# Patient Record
Sex: Male | Born: 1955 | Race: White | Hispanic: No | Marital: Married | Smoking: Never smoker
Health system: Southern US, Community
[De-identification: ages and names within clinical notes are randomized; demographics above are authoritative.]

## PROBLEM LIST (undated history)

## (undated) DIAGNOSIS — M19012 Primary osteoarthritis, left shoulder: Secondary | ICD-10-CM

## (undated) DIAGNOSIS — E785 Hyperlipidemia, unspecified: Secondary | ICD-10-CM

## (undated) DIAGNOSIS — Z860100 Personal history of colon polyps, unspecified: Secondary | ICD-10-CM

## (undated) DIAGNOSIS — I10 Essential (primary) hypertension: Secondary | ICD-10-CM

## (undated) DIAGNOSIS — C73 Malignant neoplasm of thyroid gland: Secondary | ICD-10-CM

## (undated) DIAGNOSIS — G4730 Sleep apnea, unspecified: Secondary | ICD-10-CM

## (undated) DIAGNOSIS — E890 Postprocedural hypothyroidism: Secondary | ICD-10-CM

## (undated) DIAGNOSIS — I4891 Unspecified atrial fibrillation: Secondary | ICD-10-CM

## (undated) DIAGNOSIS — I499 Cardiac arrhythmia, unspecified: Secondary | ICD-10-CM

## (undated) DIAGNOSIS — C801 Malignant (primary) neoplasm, unspecified: Secondary | ICD-10-CM

## (undated) DIAGNOSIS — F419 Anxiety disorder, unspecified: Secondary | ICD-10-CM

## (undated) DIAGNOSIS — I872 Venous insufficiency (chronic) (peripheral): Secondary | ICD-10-CM

## (undated) DIAGNOSIS — L0390 Cellulitis, unspecified: Secondary | ICD-10-CM

## (undated) DIAGNOSIS — E119 Type 2 diabetes mellitus without complications: Secondary | ICD-10-CM

## (undated) DIAGNOSIS — I5032 Chronic diastolic (congestive) heart failure: Secondary | ICD-10-CM

## (undated) DIAGNOSIS — M1990 Unspecified osteoarthritis, unspecified site: Secondary | ICD-10-CM

## (undated) DIAGNOSIS — K649 Unspecified hemorrhoids: Secondary | ICD-10-CM

## (undated) DIAGNOSIS — G43909 Migraine, unspecified, not intractable, without status migrainosus: Secondary | ICD-10-CM

## (undated) HISTORY — PX: TOTAL SHOULDER REPLACEMENT: SUR1217

## (undated) HISTORY — DX: Personal history of colonic polyps: Z86.010

## (undated) HISTORY — PX: KNEE ARTHROSCOPY: SUR90

## (undated) HISTORY — PX: TOTAL KNEE ARTHROPLASTY: SHX125

## (undated) HISTORY — DX: Postprocedural hypothyroidism: E89.0

## (undated) HISTORY — DX: Migraine, unspecified, not intractable, without status migrainosus: G43.909

## (undated) HISTORY — PX: REPLACEMENT TOTAL KNEE: SUR1224

## (undated) HISTORY — DX: Unspecified osteoarthritis, unspecified site: M19.90

## (undated) HISTORY — PX: SHOULDER ARTHROSCOPY: SHX128

## (undated) HISTORY — DX: Essential (primary) hypertension: I10

## (undated) HISTORY — PX: ANKLE FUSION: SHX881

## (undated) HISTORY — DX: Malignant (primary) neoplasm, unspecified: C80.1

## (undated) HISTORY — DX: Chronic diastolic (congestive) heart failure: I50.32

## (undated) HISTORY — DX: Primary osteoarthritis, left shoulder: M19.012

## (undated) HISTORY — DX: Cellulitis, unspecified: L03.90

## (undated) HISTORY — DX: Unspecified atrial fibrillation: I48.91

## (undated) HISTORY — DX: Malignant neoplasm of thyroid gland: C73

## (undated) HISTORY — DX: Hyperlipidemia, unspecified: E78.5

## (undated) HISTORY — DX: Unspecified hemorrhoids: K64.9

## (undated) HISTORY — DX: Personal history of colon polyps, unspecified: Z86.0100

## (undated) HISTORY — DX: Type 2 diabetes mellitus without complications: E11.9

## (undated) HISTORY — DX: Anxiety disorder, unspecified: F41.9

## (undated) HISTORY — DX: Venous insufficiency (chronic) (peripheral): I87.2

## (undated) HISTORY — DX: Sleep apnea, unspecified: G47.30

---

## 1987-10-12 DIAGNOSIS — C801 Malignant (primary) neoplasm, unspecified: Secondary | ICD-10-CM

## 1987-10-12 HISTORY — DX: Malignant (primary) neoplasm, unspecified: C80.1

## 2016-09-16 DIAGNOSIS — I4891 Unspecified atrial fibrillation: Secondary | ICD-10-CM | POA: Insufficient documentation

## 2017-09-26 DIAGNOSIS — E1141 Type 2 diabetes mellitus with diabetic mononeuropathy: Secondary | ICD-10-CM | POA: Insufficient documentation

## 2017-09-26 DIAGNOSIS — E1159 Type 2 diabetes mellitus with other circulatory complications: Secondary | ICD-10-CM | POA: Insufficient documentation

## 2017-09-26 DIAGNOSIS — E890 Postprocedural hypothyroidism: Secondary | ICD-10-CM | POA: Diagnosis not present

## 2017-09-26 DIAGNOSIS — E1169 Type 2 diabetes mellitus with other specified complication: Secondary | ICD-10-CM | POA: Insufficient documentation

## 2017-09-26 DIAGNOSIS — E119 Type 2 diabetes mellitus without complications: Secondary | ICD-10-CM | POA: Diagnosis not present

## 2017-09-26 DIAGNOSIS — L84 Corns and callosities: Secondary | ICD-10-CM | POA: Insufficient documentation

## 2017-09-26 DIAGNOSIS — I10 Essential (primary) hypertension: Secondary | ICD-10-CM

## 2017-09-26 DIAGNOSIS — I152 Hypertension secondary to endocrine disorders: Secondary | ICD-10-CM | POA: Insufficient documentation

## 2017-09-26 DIAGNOSIS — I482 Chronic atrial fibrillation: Secondary | ICD-10-CM | POA: Diagnosis not present

## 2017-09-26 DIAGNOSIS — L03116 Cellulitis of left lower limb: Secondary | ICD-10-CM | POA: Diagnosis not present

## 2017-10-06 DIAGNOSIS — B351 Tinea unguium: Secondary | ICD-10-CM | POA: Diagnosis not present

## 2017-10-06 DIAGNOSIS — E119 Type 2 diabetes mellitus without complications: Secondary | ICD-10-CM | POA: Diagnosis not present

## 2017-10-06 DIAGNOSIS — M79675 Pain in left toe(s): Secondary | ICD-10-CM | POA: Diagnosis not present

## 2017-10-06 DIAGNOSIS — M79674 Pain in right toe(s): Secondary | ICD-10-CM | POA: Diagnosis not present

## 2017-11-07 DIAGNOSIS — E890 Postprocedural hypothyroidism: Secondary | ICD-10-CM | POA: Diagnosis not present

## 2017-11-07 DIAGNOSIS — E119 Type 2 diabetes mellitus without complications: Secondary | ICD-10-CM | POA: Diagnosis not present

## 2017-11-07 DIAGNOSIS — I482 Chronic atrial fibrillation: Secondary | ICD-10-CM | POA: Diagnosis not present

## 2017-11-07 DIAGNOSIS — I10 Essential (primary) hypertension: Secondary | ICD-10-CM | POA: Diagnosis not present

## 2017-11-14 DIAGNOSIS — E11319 Type 2 diabetes mellitus with unspecified diabetic retinopathy without macular edema: Secondary | ICD-10-CM | POA: Diagnosis not present

## 2017-11-14 DIAGNOSIS — H524 Presbyopia: Secondary | ICD-10-CM

## 2017-11-14 DIAGNOSIS — H5211 Myopia, right eye: Secondary | ICD-10-CM | POA: Diagnosis not present

## 2017-11-14 DIAGNOSIS — H5213 Myopia, bilateral: Secondary | ICD-10-CM | POA: Insufficient documentation

## 2017-11-14 DIAGNOSIS — H52203 Unspecified astigmatism, bilateral: Secondary | ICD-10-CM

## 2017-11-14 DIAGNOSIS — H25813 Combined forms of age-related cataract, bilateral: Secondary | ICD-10-CM | POA: Insufficient documentation

## 2017-11-15 DIAGNOSIS — M19012 Primary osteoarthritis, left shoulder: Secondary | ICD-10-CM | POA: Diagnosis not present

## 2017-11-22 DIAGNOSIS — E119 Type 2 diabetes mellitus without complications: Secondary | ICD-10-CM | POA: Diagnosis not present

## 2017-11-22 DIAGNOSIS — Z885 Allergy status to narcotic agent status: Secondary | ICD-10-CM | POA: Diagnosis not present

## 2017-11-22 DIAGNOSIS — Z6841 Body Mass Index (BMI) 40.0 and over, adult: Secondary | ICD-10-CM | POA: Diagnosis not present

## 2017-11-22 DIAGNOSIS — E890 Postprocedural hypothyroidism: Secondary | ICD-10-CM | POA: Diagnosis not present

## 2017-11-22 DIAGNOSIS — C73 Malignant neoplasm of thyroid gland: Secondary | ICD-10-CM | POA: Diagnosis not present

## 2017-11-22 DIAGNOSIS — I482 Chronic atrial fibrillation: Secondary | ICD-10-CM | POA: Diagnosis not present

## 2017-11-22 DIAGNOSIS — Z79899 Other long term (current) drug therapy: Secondary | ICD-10-CM | POA: Diagnosis not present

## 2017-11-22 DIAGNOSIS — Z7984 Long term (current) use of oral hypoglycemic drugs: Secondary | ICD-10-CM | POA: Diagnosis not present

## 2017-11-29 DIAGNOSIS — C73 Malignant neoplasm of thyroid gland: Secondary | ICD-10-CM | POA: Insufficient documentation

## 2017-12-22 DIAGNOSIS — C73 Malignant neoplasm of thyroid gland: Secondary | ICD-10-CM | POA: Diagnosis not present

## 2017-12-22 DIAGNOSIS — E890 Postprocedural hypothyroidism: Secondary | ICD-10-CM | POA: Diagnosis not present

## 2017-12-30 DIAGNOSIS — Z86010 Personal history of colonic polyps: Secondary | ICD-10-CM | POA: Diagnosis not present

## 2018-01-05 DIAGNOSIS — E890 Postprocedural hypothyroidism: Secondary | ICD-10-CM | POA: Diagnosis not present

## 2018-01-05 DIAGNOSIS — I872 Venous insufficiency (chronic) (peripheral): Secondary | ICD-10-CM | POA: Diagnosis not present

## 2018-01-05 DIAGNOSIS — G4733 Obstructive sleep apnea (adult) (pediatric): Secondary | ICD-10-CM | POA: Insufficient documentation

## 2018-01-05 DIAGNOSIS — E119 Type 2 diabetes mellitus without complications: Secondary | ICD-10-CM | POA: Diagnosis not present

## 2018-01-05 DIAGNOSIS — I482 Chronic atrial fibrillation: Secondary | ICD-10-CM | POA: Diagnosis not present

## 2018-01-17 DIAGNOSIS — L821 Other seborrheic keratosis: Secondary | ICD-10-CM | POA: Diagnosis not present

## 2018-01-17 DIAGNOSIS — L918 Other hypertrophic disorders of the skin: Secondary | ICD-10-CM | POA: Diagnosis not present

## 2018-01-17 DIAGNOSIS — D1801 Hemangioma of skin and subcutaneous tissue: Secondary | ICD-10-CM | POA: Diagnosis not present

## 2018-01-17 DIAGNOSIS — L814 Other melanin hyperpigmentation: Secondary | ICD-10-CM | POA: Diagnosis not present

## 2018-02-08 DIAGNOSIS — I10 Essential (primary) hypertension: Secondary | ICD-10-CM | POA: Diagnosis not present

## 2018-02-08 DIAGNOSIS — E119 Type 2 diabetes mellitus without complications: Secondary | ICD-10-CM | POA: Diagnosis not present

## 2018-02-08 DIAGNOSIS — E785 Hyperlipidemia, unspecified: Secondary | ICD-10-CM | POA: Insufficient documentation

## 2018-02-08 DIAGNOSIS — E1169 Type 2 diabetes mellitus with other specified complication: Secondary | ICD-10-CM | POA: Insufficient documentation

## 2018-02-08 DIAGNOSIS — I482 Chronic atrial fibrillation: Secondary | ICD-10-CM | POA: Diagnosis not present

## 2018-02-08 DIAGNOSIS — E890 Postprocedural hypothyroidism: Secondary | ICD-10-CM | POA: Diagnosis not present

## 2018-02-08 DIAGNOSIS — E782 Mixed hyperlipidemia: Secondary | ICD-10-CM | POA: Diagnosis not present

## 2018-02-13 DIAGNOSIS — E119 Type 2 diabetes mellitus without complications: Secondary | ICD-10-CM | POA: Diagnosis not present

## 2018-02-13 DIAGNOSIS — I110 Hypertensive heart disease with heart failure: Secondary | ICD-10-CM | POA: Diagnosis not present

## 2018-02-13 DIAGNOSIS — I482 Chronic atrial fibrillation: Secondary | ICD-10-CM | POA: Diagnosis not present

## 2018-02-13 DIAGNOSIS — Z79899 Other long term (current) drug therapy: Secondary | ICD-10-CM | POA: Diagnosis not present

## 2018-02-13 DIAGNOSIS — I5030 Unspecified diastolic (congestive) heart failure: Secondary | ICD-10-CM | POA: Diagnosis not present

## 2018-02-13 DIAGNOSIS — Z7901 Long term (current) use of anticoagulants: Secondary | ICD-10-CM | POA: Diagnosis not present

## 2018-02-13 DIAGNOSIS — Z7984 Long term (current) use of oral hypoglycemic drugs: Secondary | ICD-10-CM | POA: Diagnosis not present

## 2018-02-13 DIAGNOSIS — I5033 Acute on chronic diastolic (congestive) heart failure: Secondary | ICD-10-CM | POA: Diagnosis not present

## 2018-02-14 DIAGNOSIS — B351 Tinea unguium: Secondary | ICD-10-CM | POA: Diagnosis not present

## 2018-02-20 DIAGNOSIS — I5033 Acute on chronic diastolic (congestive) heart failure: Secondary | ICD-10-CM | POA: Diagnosis not present

## 2018-03-22 DIAGNOSIS — C73 Malignant neoplasm of thyroid gland: Secondary | ICD-10-CM | POA: Diagnosis not present

## 2018-03-22 DIAGNOSIS — Z6841 Body Mass Index (BMI) 40.0 and over, adult: Secondary | ICD-10-CM | POA: Diagnosis not present

## 2018-03-22 DIAGNOSIS — E119 Type 2 diabetes mellitus without complications: Secondary | ICD-10-CM | POA: Diagnosis not present

## 2018-03-27 DIAGNOSIS — E119 Type 2 diabetes mellitus without complications: Secondary | ICD-10-CM | POA: Diagnosis not present

## 2018-03-27 DIAGNOSIS — I482 Chronic atrial fibrillation: Secondary | ICD-10-CM | POA: Diagnosis not present

## 2018-03-27 DIAGNOSIS — I110 Hypertensive heart disease with heart failure: Secondary | ICD-10-CM | POA: Diagnosis not present

## 2018-03-27 DIAGNOSIS — I5032 Chronic diastolic (congestive) heart failure: Secondary | ICD-10-CM | POA: Diagnosis not present

## 2018-03-28 DIAGNOSIS — M19012 Primary osteoarthritis, left shoulder: Secondary | ICD-10-CM | POA: Diagnosis not present

## 2018-03-30 DIAGNOSIS — C73 Malignant neoplasm of thyroid gland: Secondary | ICD-10-CM | POA: Diagnosis not present

## 2018-05-01 DIAGNOSIS — C73 Malignant neoplasm of thyroid gland: Secondary | ICD-10-CM | POA: Diagnosis not present

## 2018-05-01 DIAGNOSIS — Z7984 Long term (current) use of oral hypoglycemic drugs: Secondary | ICD-10-CM | POA: Diagnosis not present

## 2018-05-01 DIAGNOSIS — Z6841 Body Mass Index (BMI) 40.0 and over, adult: Secondary | ICD-10-CM | POA: Diagnosis not present

## 2018-05-01 DIAGNOSIS — Z7901 Long term (current) use of anticoagulants: Secondary | ICD-10-CM | POA: Diagnosis not present

## 2018-05-01 DIAGNOSIS — I5033 Acute on chronic diastolic (congestive) heart failure: Secondary | ICD-10-CM | POA: Diagnosis not present

## 2018-05-01 DIAGNOSIS — E890 Postprocedural hypothyroidism: Secondary | ICD-10-CM | POA: Diagnosis not present

## 2018-05-01 DIAGNOSIS — E119 Type 2 diabetes mellitus without complications: Secondary | ICD-10-CM | POA: Diagnosis not present

## 2018-05-01 DIAGNOSIS — I5032 Chronic diastolic (congestive) heart failure: Secondary | ICD-10-CM | POA: Insufficient documentation

## 2018-05-01 DIAGNOSIS — I482 Chronic atrial fibrillation: Secondary | ICD-10-CM | POA: Diagnosis not present

## 2018-05-01 DIAGNOSIS — I110 Hypertensive heart disease with heart failure: Secondary | ICD-10-CM | POA: Diagnosis not present

## 2018-05-01 DIAGNOSIS — E782 Mixed hyperlipidemia: Secondary | ICD-10-CM | POA: Diagnosis not present

## 2018-05-19 DIAGNOSIS — I110 Hypertensive heart disease with heart failure: Secondary | ICD-10-CM | POA: Diagnosis not present

## 2018-05-19 DIAGNOSIS — Z86010 Personal history of colonic polyps: Secondary | ICD-10-CM | POA: Diagnosis not present

## 2018-05-19 DIAGNOSIS — K621 Rectal polyp: Secondary | ICD-10-CM | POA: Diagnosis not present

## 2018-05-19 DIAGNOSIS — I5030 Unspecified diastolic (congestive) heart failure: Secondary | ICD-10-CM | POA: Diagnosis not present

## 2018-05-19 DIAGNOSIS — D123 Benign neoplasm of transverse colon: Secondary | ICD-10-CM | POA: Diagnosis not present

## 2018-05-19 DIAGNOSIS — D128 Benign neoplasm of rectum: Secondary | ICD-10-CM | POA: Diagnosis not present

## 2018-05-19 DIAGNOSIS — M1990 Unspecified osteoarthritis, unspecified site: Secondary | ICD-10-CM | POA: Diagnosis not present

## 2018-05-19 DIAGNOSIS — E785 Hyperlipidemia, unspecified: Secondary | ICD-10-CM | POA: Diagnosis not present

## 2018-05-19 DIAGNOSIS — G4733 Obstructive sleep apnea (adult) (pediatric): Secondary | ICD-10-CM | POA: Diagnosis not present

## 2018-05-19 DIAGNOSIS — I4510 Unspecified right bundle-branch block: Secondary | ICD-10-CM | POA: Diagnosis not present

## 2018-05-19 DIAGNOSIS — K648 Other hemorrhoids: Secondary | ICD-10-CM | POA: Diagnosis not present

## 2018-05-19 DIAGNOSIS — K6389 Other specified diseases of intestine: Secondary | ICD-10-CM | POA: Diagnosis not present

## 2018-05-19 DIAGNOSIS — D125 Benign neoplasm of sigmoid colon: Secondary | ICD-10-CM | POA: Diagnosis not present

## 2018-05-19 DIAGNOSIS — I4891 Unspecified atrial fibrillation: Secondary | ICD-10-CM | POA: Diagnosis not present

## 2018-05-19 DIAGNOSIS — K635 Polyp of colon: Secondary | ICD-10-CM | POA: Diagnosis not present

## 2018-05-19 DIAGNOSIS — Z1211 Encounter for screening for malignant neoplasm of colon: Secondary | ICD-10-CM | POA: Diagnosis not present

## 2018-05-19 DIAGNOSIS — K5730 Diverticulosis of large intestine without perforation or abscess without bleeding: Secondary | ICD-10-CM | POA: Diagnosis not present

## 2018-05-19 DIAGNOSIS — I10 Essential (primary) hypertension: Secondary | ICD-10-CM | POA: Diagnosis not present

## 2018-05-19 DIAGNOSIS — E669 Obesity, unspecified: Secondary | ICD-10-CM | POA: Diagnosis not present

## 2018-05-19 DIAGNOSIS — D122 Benign neoplasm of ascending colon: Secondary | ICD-10-CM | POA: Diagnosis not present

## 2018-06-27 DIAGNOSIS — B351 Tinea unguium: Secondary | ICD-10-CM | POA: Diagnosis not present

## 2018-06-27 DIAGNOSIS — L03032 Cellulitis of left toe: Secondary | ICD-10-CM | POA: Diagnosis not present

## 2018-06-29 DIAGNOSIS — I10 Essential (primary) hypertension: Secondary | ICD-10-CM | POA: Diagnosis not present

## 2018-06-29 DIAGNOSIS — Z634 Disappearance and death of family member: Secondary | ICD-10-CM | POA: Diagnosis not present

## 2018-06-29 DIAGNOSIS — F411 Generalized anxiety disorder: Secondary | ICD-10-CM | POA: Diagnosis not present

## 2018-06-29 DIAGNOSIS — E119 Type 2 diabetes mellitus without complications: Secondary | ICD-10-CM | POA: Diagnosis not present

## 2018-08-31 DIAGNOSIS — Z6841 Body Mass Index (BMI) 40.0 and over, adult: Secondary | ICD-10-CM | POA: Diagnosis not present

## 2018-08-31 DIAGNOSIS — Z79890 Hormone replacement therapy (postmenopausal): Secondary | ICD-10-CM | POA: Diagnosis not present

## 2018-08-31 DIAGNOSIS — I110 Hypertensive heart disease with heart failure: Secondary | ICD-10-CM | POA: Diagnosis not present

## 2018-08-31 DIAGNOSIS — E119 Type 2 diabetes mellitus without complications: Secondary | ICD-10-CM | POA: Diagnosis not present

## 2018-08-31 DIAGNOSIS — Z0000 Encounter for general adult medical examination without abnormal findings: Secondary | ICD-10-CM | POA: Diagnosis not present

## 2018-08-31 DIAGNOSIS — I5032 Chronic diastolic (congestive) heart failure: Secondary | ICD-10-CM | POA: Diagnosis not present

## 2018-08-31 DIAGNOSIS — E890 Postprocedural hypothyroidism: Secondary | ICD-10-CM | POA: Diagnosis not present

## 2018-08-31 DIAGNOSIS — I482 Chronic atrial fibrillation, unspecified: Secondary | ICD-10-CM | POA: Diagnosis not present

## 2018-08-31 DIAGNOSIS — C73 Malignant neoplasm of thyroid gland: Secondary | ICD-10-CM | POA: Diagnosis not present

## 2018-10-09 DIAGNOSIS — E890 Postprocedural hypothyroidism: Secondary | ICD-10-CM | POA: Diagnosis not present

## 2018-10-09 DIAGNOSIS — B029 Zoster without complications: Secondary | ICD-10-CM | POA: Diagnosis not present

## 2018-10-09 DIAGNOSIS — E119 Type 2 diabetes mellitus without complications: Secondary | ICD-10-CM | POA: Diagnosis not present

## 2018-10-10 DIAGNOSIS — B029 Zoster without complications: Secondary | ICD-10-CM | POA: Diagnosis not present

## 2018-10-10 DIAGNOSIS — R7303 Prediabetes: Secondary | ICD-10-CM | POA: Diagnosis not present

## 2018-10-10 DIAGNOSIS — H43813 Vitreous degeneration, bilateral: Secondary | ICD-10-CM | POA: Diagnosis not present

## 2018-10-10 DIAGNOSIS — H35033 Hypertensive retinopathy, bilateral: Secondary | ICD-10-CM | POA: Diagnosis not present

## 2018-10-10 DIAGNOSIS — H5710 Ocular pain, unspecified eye: Secondary | ICD-10-CM | POA: Diagnosis not present

## 2018-10-10 DIAGNOSIS — B0230 Zoster ocular disease, unspecified: Secondary | ICD-10-CM | POA: Diagnosis not present

## 2018-10-10 DIAGNOSIS — H2513 Age-related nuclear cataract, bilateral: Secondary | ICD-10-CM | POA: Diagnosis not present

## 2018-10-10 DIAGNOSIS — H35371 Puckering of macula, right eye: Secondary | ICD-10-CM | POA: Diagnosis not present

## 2018-10-17 DIAGNOSIS — B351 Tinea unguium: Secondary | ICD-10-CM | POA: Diagnosis not present

## 2018-10-17 DIAGNOSIS — I878 Other specified disorders of veins: Secondary | ICD-10-CM | POA: Diagnosis not present

## 2018-10-17 DIAGNOSIS — E119 Type 2 diabetes mellitus without complications: Secondary | ICD-10-CM | POA: Diagnosis not present

## 2018-10-26 DIAGNOSIS — H2513 Age-related nuclear cataract, bilateral: Secondary | ICD-10-CM | POA: Diagnosis not present

## 2018-10-26 DIAGNOSIS — H43813 Vitreous degeneration, bilateral: Secondary | ICD-10-CM | POA: Diagnosis not present

## 2018-10-26 DIAGNOSIS — H35371 Puckering of macula, right eye: Secondary | ICD-10-CM | POA: Diagnosis not present

## 2018-10-26 DIAGNOSIS — B0230 Zoster ocular disease, unspecified: Secondary | ICD-10-CM | POA: Diagnosis not present

## 2018-10-26 LAB — HM DIABETES EYE EXAM

## 2018-10-30 DIAGNOSIS — G4733 Obstructive sleep apnea (adult) (pediatric): Secondary | ICD-10-CM | POA: Diagnosis not present

## 2018-10-30 DIAGNOSIS — G4736 Sleep related hypoventilation in conditions classified elsewhere: Secondary | ICD-10-CM | POA: Diagnosis not present

## 2018-11-13 DIAGNOSIS — I10 Essential (primary) hypertension: Secondary | ICD-10-CM | POA: Diagnosis not present

## 2018-11-13 DIAGNOSIS — I5032 Chronic diastolic (congestive) heart failure: Secondary | ICD-10-CM | POA: Diagnosis not present

## 2018-11-13 DIAGNOSIS — I482 Chronic atrial fibrillation, unspecified: Secondary | ICD-10-CM | POA: Diagnosis not present

## 2018-11-13 DIAGNOSIS — E119 Type 2 diabetes mellitus without complications: Secondary | ICD-10-CM | POA: Diagnosis not present

## 2018-11-13 DIAGNOSIS — I110 Hypertensive heart disease with heart failure: Secondary | ICD-10-CM | POA: Diagnosis not present

## 2018-11-13 DIAGNOSIS — I5030 Unspecified diastolic (congestive) heart failure: Secondary | ICD-10-CM | POA: Diagnosis not present

## 2018-12-06 DIAGNOSIS — I482 Chronic atrial fibrillation, unspecified: Secondary | ICD-10-CM | POA: Diagnosis not present

## 2018-12-06 DIAGNOSIS — I110 Hypertensive heart disease with heart failure: Secondary | ICD-10-CM | POA: Diagnosis not present

## 2018-12-06 DIAGNOSIS — I872 Venous insufficiency (chronic) (peripheral): Secondary | ICD-10-CM | POA: Diagnosis not present

## 2018-12-06 DIAGNOSIS — I509 Heart failure, unspecified: Secondary | ICD-10-CM | POA: Diagnosis not present

## 2018-12-11 DIAGNOSIS — I110 Hypertensive heart disease with heart failure: Secondary | ICD-10-CM | POA: Diagnosis not present

## 2018-12-11 DIAGNOSIS — I482 Chronic atrial fibrillation, unspecified: Secondary | ICD-10-CM | POA: Diagnosis not present

## 2018-12-11 DIAGNOSIS — I5032 Chronic diastolic (congestive) heart failure: Secondary | ICD-10-CM | POA: Diagnosis not present

## 2018-12-11 DIAGNOSIS — I4510 Unspecified right bundle-branch block: Secondary | ICD-10-CM | POA: Diagnosis not present

## 2018-12-15 ENCOUNTER — Ambulatory Visit: Payer: BLUE CROSS/BLUE SHIELD | Admitting: Family Medicine

## 2018-12-15 ENCOUNTER — Encounter: Payer: Self-pay | Admitting: Family Medicine

## 2018-12-15 VITALS — BP 131/78 | HR 104 | Temp 98.9°F | Ht 75.0 in | Wt >= 6400 oz

## 2018-12-15 DIAGNOSIS — Z86010 Personal history of colonic polyps: Secondary | ICD-10-CM | POA: Insufficient documentation

## 2018-12-15 DIAGNOSIS — E119 Type 2 diabetes mellitus without complications: Secondary | ICD-10-CM

## 2018-12-15 DIAGNOSIS — I10 Essential (primary) hypertension: Secondary | ICD-10-CM

## 2018-12-15 DIAGNOSIS — I4891 Unspecified atrial fibrillation: Secondary | ICD-10-CM

## 2018-12-15 DIAGNOSIS — E1169 Type 2 diabetes mellitus with other specified complication: Secondary | ICD-10-CM

## 2018-12-15 DIAGNOSIS — L989 Disorder of the skin and subcutaneous tissue, unspecified: Secondary | ICD-10-CM

## 2018-12-15 DIAGNOSIS — Z7689 Persons encountering health services in other specified circumstances: Secondary | ICD-10-CM

## 2018-12-15 DIAGNOSIS — Z6841 Body Mass Index (BMI) 40.0 and over, adult: Secondary | ICD-10-CM

## 2018-12-15 DIAGNOSIS — M19012 Primary osteoarthritis, left shoulder: Secondary | ICD-10-CM

## 2018-12-15 DIAGNOSIS — L84 Corns and callosities: Secondary | ICD-10-CM

## 2018-12-15 DIAGNOSIS — E1159 Type 2 diabetes mellitus with other circulatory complications: Secondary | ICD-10-CM

## 2018-12-15 DIAGNOSIS — I152 Hypertension secondary to endocrine disorders: Secondary | ICD-10-CM

## 2018-12-15 DIAGNOSIS — E890 Postprocedural hypothyroidism: Secondary | ICD-10-CM

## 2018-12-15 DIAGNOSIS — E785 Hyperlipidemia, unspecified: Secondary | ICD-10-CM

## 2018-12-15 LAB — BAYER DCA HB A1C WAIVED: HB A1C: 6.7 % (ref <7.0)

## 2018-12-15 NOTE — Progress Notes (Signed)
Subjective:  Patient ID: Richard Crawford, male    DOB: 03/05/1956, 63 y.o.   MRN: [id number]  Chief Complaint:  [place] (moved here from [facility])   HPI: Richard Crawford is a 63 y.o. male presenting on 12/15/2018 for [place] (moved here from [facility])   1. Encounter to establish care  Pt has moved to the area from [place]. Pt states he would like to establish with a PCP in the area.    2. Hypertension associated with diabetes ([place])  Complaint with meds - Yes Checking BP at home - No Exercising Regularly - No Watching Salt intake - No Pertinent ROS:  Headache - No Chest pain - No Dyspnea - No Palpitations - No LE edema - Yes They report good compliance with medications and can restate their regimen by memory. No medication side effects.  BP Readings from Last 3 Encounters:  12/15/18 131/78     3. Type 2 diabetes mellitus without complication, without long-term current use of insulin ([place])  Compliant with meds - Yes Checking CBGs? No Exercising regularly? - No Watching carbohydrate intake? - No Neuropathy ? - No Hypoglycemic events - No Pertinent ROS:  Polyuria - No Polydipsia - No Vision problems - No   4. Morbid obesity with BMI of 50.0-59.9, adult ([place])  Does not diet or exercise on a regular basis.    5. Hyperlipidemia associated with type 2 diabetes mellitus ([place])  Does not diet or exercise on a regular basis. Compliant with medications without associated side effects.   6. Postoperative hypothyroidism  Compliant with medications. Is not happy with current synthroid dose. States he is tired all of the time and has not energy for everyday activities. Would like a referral to a new endocrinologist.    7. Rapid atrial fibrillation ([place])  No palpitations, chest pain, shortness of breath, or syncope. Compliant with medications. Would like referral to new cardiologist.    8. Corns and callosity  Calluses to bilateral feet, history of  diabetes. Would like a referral to podiatry.  \  9. Primary osteoarthritis of left shoulder  Ongoing for several years. No new injuries, just increasing pain. Would like referral to ortho.   10. Skin abnormalities  Multiple skin tags and abnormal moles. States he has had these for several years. States at times some of the lesions will bleed. Some are very tender to touch and increasing in size.      Relevant past medical, surgical, family, and social history reviewed and updated as indicated.  Allergies and medications reviewed and updated.   Past Medical History:  Diagnosis Date  . Anxiety   . Arthritis    left shoulder  . Atrial fibrillation ([place])   . Cancer ([place]) 1989   thyroid  . Cellulitis   . Chronic heart failure with preserved ejection fraction ([place])   . Diabetes mellitus without complication ([place])   . Hemorrhoids    external  . Hx of colonic polyps   . Hyperlipidemia   . Hypertension   . Migraine   . Papillary thyroid carcinoma ([place])   . Postoperative hypothyroidism   . Primary osteoarthritis of left shoulder   . Sleep apnea   . Venous stasis dermatitis of both lower extremities     Past Surgical History:  Procedure Laterality Date  . ANKLE FUSION    . KNEE ARTHROSCOPY    . SHOULDER ARTHROSCOPY      Social History   Socioeconomic History  .  Marital status: Married    Spouse name: Not on file  . Number of children: Not on file  . Years of education: Not on file  . Highest education level: Not on file  Occupational History  . Not on file  Social Needs  . Financial resource strain: Not on file  . Food insecurity:    Worry: Not on file    Inability: Not on file  . Transportation needs:    Medical: Not on file    Non-medical: Not on file  Tobacco Use  . Smoking status: Never Smoker  . Smokeless tobacco: Never Used  Substance and Sexual Activity  . Alcohol use: Not Currently  . Drug use: Never  . Sexual activity: Not on file  Lifestyle  .  Physical activity:    Days per week: Not on file    Minutes per session: Not on file  . Stress: Not on file  Relationships  . Social connections:    Talks on phone: Not on file    Gets together: Not on file    Attends religious service: Not on file    Active member of club or organization: Not on file    Attends meetings of clubs or organizations: Not on file    Relationship status: Not on file  . Intimate partner violence:    Fear of current or ex partner: Not on file    Emotionally abused: Not on file    Physically abused: Not on file    Forced sexual activity: Not on file  Other Topics Concern  . Not on file  Social History Narrative  . Not on file    Outpatient Encounter Medications as of 12/15/2018  Medication Sig  . atorvastatin (LIPITOR) 20 MG tablet Take 20 mg by mouth daily.  . celecoxib (CELEBREX) 200 MG capsule Take 200 mg by mouth daily.  . furosemide (LASIX) 80 MG tablet Take 80 mg by mouth 2 (two) times daily.  Marland Kitchen levothyroxine (SYNTHROID, LEVOTHROID) 112 MCG tablet Take 224 mcg by mouth daily.  . metFORMIN (GLUCOPHAGE-XR) 750 MG 24 hr tablet Take 750 mg by mouth daily with breakfast.  . metoprolol succinate (TOPROL-XL) 100 MG 24 hr tablet Take 200 mg by mouth daily. Take with or immediately following a meal.  . Multiple Vitamin (MULTIVITAMIN) tablet Take 1 tablet by mouth 2 (two) times daily.  . rivaroxaban (XARELTO) 20 MG TABS tablet Take 20 mg by mouth daily with supper.  Marland Kitchen spironolactone (ALDACTONE) 25 MG tablet Take 25 mg by mouth daily.  . valsartan (DIOVAN) 80 MG tablet Take 80 mg by mouth daily.  . vitamin B-12 (CYANOCOBALAMIN) 500 MCG tablet Take 500 mcg by mouth daily.   No facility-administered encounter medications on file as of 12/15/2018.     Allergies  Allergen Reactions  . Morphine And Related Nausea And Vomiting    Review of Systems  Constitutional: Positive for activity change and fatigue. Negative for chills, diaphoresis, fever and unexpected  weight change.  Respiratory: Negative for cough, chest tightness, shortness of breath, wheezing and stridor.   Cardiovascular: Positive for leg swelling. Negative for chest pain and palpitations.  Gastrointestinal: Negative for abdominal pain, anal bleeding, blood in stool, constipation, diarrhea, nausea, rectal pain and vomiting.  Endocrine: Positive for polyuria. Negative for cold intolerance, heat intolerance, polydipsia and polyphagia.  Musculoskeletal: Positive for arthralgias (chronic). Negative for gait problem, joint swelling, myalgias, neck pain and neck stiffness.  Skin: Negative for wound.  Multiple moles and skin tags  Neurological: Negative for dizziness, tremors, seizures, syncope, facial asymmetry, speech difficulty, weakness, light-headedness, numbness and headaches.  Hematological: Negative for adenopathy. Does not bruise/bleed easily.  Psychiatric/Behavioral: Negative for confusion.  All other systems reviewed and are negative.       Objective:  BP 131/78   Pulse (!) 104   Temp 98.9 F (37.2 C) (Oral)   Ht '6\' 3"'$  (1.905 m)   Wt (!) 404 lb (183.3 kg)   BMI 50.50 kg/m    Wt Readings from Last 3 Encounters:  12/15/18 (!) 404 lb (183.3 kg)    Physical Exam Vitals signs and nursing note reviewed.  Constitutional:      General: He is not in acute distress.    Appearance: Normal appearance. He is obese. He is not ill-appearing or toxic-appearing.  HENT:     Head: Normocephalic and atraumatic.     Mouth/Throat:     Mouth: Mucous membranes are moist.     Pharynx: Oropharynx is clear.  Eyes:     Extraocular Movements: Extraocular movements intact.     Conjunctiva/sclera: Conjunctivae normal.     Pupils: Pupils are equal, round, and reactive to light.  Neck:     Musculoskeletal: Neck supple.     Vascular: No carotid bruit.  Cardiovascular:     Rate and Rhythm: Normal rate. Rhythm regularly irregular.     Pulses: Normal pulses.     Heart sounds: Normal  heart sounds. No murmur. No friction rub. No gallop.   Pulmonary:     Effort: Pulmonary effort is normal. No respiratory distress.     Breath sounds: Normal breath sounds.  Musculoskeletal:     Right lower leg: 1+ Edema present.     Left lower leg: 1+ Edema present.  Skin:    General: Skin is warm and dry.     Capillary Refill: Capillary refill takes less than 2 seconds.     Comments: Multiple skin tags to bilateral eyes, upper arms, and axilla. Multiple raised lesions to axilla and upper back.   Neurological:     General: No focal deficit present.     Mental Status: He is alert and oriented to person, place, and time.     Cranial Nerves: No cranial nerve deficit.     Sensory: No sensory deficit.     Motor: No weakness.     Coordination: Coordination normal.     Gait: Gait normal.     Deep Tendon Reflexes: Reflexes normal.  Psychiatric:        Mood and Affect: Mood normal.        Behavior: Behavior normal. Behavior is cooperative.        Thought Content: Thought content normal.        Judgment: Judgment normal.     No results found for this or any previous visit.     Pertinent labs & imaging results that were available during my care of the patient were reviewed by me and considered in my medical decision making.  Assessment & Plan:  Derrian was seen today for establish care.  Diagnoses and all orders for this visit:  Encounter to establish care  Hypertension associated with diabetes ([place]) Labs pending. States he does not need refills today. Wants referral to cardiology in area.  -     Ambulatory referral to Cardiology -     CBC with Differential/Platelet -     CMP14+EGFR -     Lipid panel -  Microalbumin / creatinine urine ratio  Type 2 diabetes mellitus without complication, without long-term current use of insulin (HCC) A1C 6.7 in office today. Other labs pending. Would like referral to new endocrinologist. Continue medications and diet.  -     Ambulatory  referral to Endocrinology -     Amb ref to Medical Nutrition Therapy-MNT -     CBC with Differential/Platelet -     CMP14+EGFR -     Lipid panel -     Microalbumin / creatinine urine ratio -     Bayer DCA Hb A1c Waived  Morbid obesity with BMI of 50.0-59.9, adult (HCC) Diet and exercise encouraged. Referral to nutrition.  -     Amb ref to Medical Nutrition Therapy-MNT -     CMP14+EGFR -     Lipid panel -     Thyroid Panel With TSH  Hyperlipidemia associated with type 2 diabetes mellitus ([place]) Labs pending. States he does not need refills today. Continue medications as prescribed.  -     Amb ref to Medical Nutrition Therapy-MNT -     CBC with Differential/Platelet -     CMP14+EGFR -     Lipid panel  Postoperative hypothyroidism Unhappy with current synthroid dose. Would like referral to new endocrinologist. Labs pending.  -     Ambulatory referral to Endocrinology -     Thyroid Panel With TSH  Rapid atrial fibrillation ([place]) Stable. Wants referral to cardiology in area. Continue medications as prescribed.  -     Ambulatory referral to Cardiology -     CBC with Differential/Platelet  Corns and callosity -     Ambulatory referral to Podiatry  Primary osteoarthritis of left shoulder -     Ambulatory referral to Orthopedic Surgery  Skin abnormalities Multiple skin tags and abnormal moles. Will refer to dermatology.  -     Ambulatory referral to Dermatology     Continue all other maintenance medications.  Follow up plan: Return in about 3 months (around 03/17/2019) for DM, HTN.   The above assessment and management plan was discussed with the patient. The patient verbalized understanding of and has agreed to the management plan. Patient is aware to call the clinic if symptoms persist or worsen. Patient is aware when to return to the clinic for a follow-up visit. Patient educated on when it is appropriate to go to the emergency department.   Monia Pouch, FNP-C [facility] [phone]

## 2018-12-16 LAB — CMP14+EGFR
ALBUMIN: 4.6 g/dL (ref 3.8–4.8)
ALT: 16 IU/L (ref 0–44)
AST: 20 IU/L (ref 0–40)
Albumin/Globulin Ratio: 1.4 (ref 1.2–2.2)
Alkaline Phosphatase: 109 IU/L (ref 39–117)
BUN / CREAT RATIO: 17 (ref 10–24)
BUN: 19 mg/dL (ref 8–27)
Bilirubin Total: 0.6 mg/dL (ref 0.0–1.2)
CO2: 28 mmol/L (ref 20–29)
Calcium: 9.6 mg/dL (ref 8.6–10.2)
Chloride: 100 mmol/L (ref 96–106)
Creatinine, Ser: 1.14 mg/dL (ref 0.76–1.27)
GFR calc Af Amer: 78 mL/min/{1.73_m2} (ref >59)
GFR calc non Af Amer: 68 mL/min/{1.73_m2} (ref >59)
GLOBULIN, TOTAL: 3.2 g/dL (ref 1.5–4.5)
Glucose: 223 mg/dL — ABNORMAL HIGH (ref 65–99)
Potassium: 4.5 mmol/L (ref 3.5–5.2)
Sodium: 141 mmol/L (ref 134–144)
Total Protein: 7.8 g/dL (ref 6.0–8.5)

## 2018-12-16 LAB — CBC WITH DIFFERENTIAL/PLATELET
Basophils Absolute: 0.1 10*3/uL (ref 0.0–0.2)
Basos: 1 %
EOS (ABSOLUTE): 0.2 10*3/uL (ref 0.0–0.4)
EOS: 3 %
HEMATOCRIT: 42.3 % (ref 37.5–51.0)
Hemoglobin: 14.6 g/dL (ref 13.0–17.7)
Immature Grans (Abs): 0.1 10*3/uL (ref 0.0–0.1)
Immature Granulocytes: 1 %
Lymphocytes Absolute: 2.7 10*3/uL (ref 0.7–3.1)
Lymphs: 34 %
MCH: 30 pg (ref 26.6–33.0)
MCHC: 34.5 g/dL (ref 31.5–35.7)
MCV: 87 fL (ref 79–97)
Monocytes Absolute: 0.9 10*3/uL (ref 0.1–0.9)
Monocytes: 12 %
Neutrophils Absolute: 4 10*3/uL (ref 1.4–7.0)
Neutrophils: 49 %
Platelets: 241 10*3/uL (ref 150–450)
RBC: 4.86 x10E6/uL (ref 4.14–5.80)
RDW: 12.7 % (ref 11.6–15.4)
WBC: 7.9 10*3/uL (ref 3.4–10.8)

## 2018-12-16 LAB — THYROID PANEL WITH TSH
Free Thyroxine Index: 2.6 (ref 1.2–4.9)
T3 Uptake Ratio: 25 % (ref 24–39)
T4 TOTAL: 10.2 ug/dL (ref 4.5–12.0)
TSH: 0.462 u[IU]/mL (ref 0.450–4.500)

## 2018-12-16 LAB — LIPID PANEL
Chol/HDL Ratio: 3 ratio (ref 0.0–5.0)
Cholesterol, Total: 105 mg/dL (ref 100–199)
HDL: 35 mg/dL — ABNORMAL LOW (ref >39)
LDL Calculated: 35 mg/dL (ref 0–99)
Triglycerides: 174 mg/dL — ABNORMAL HIGH (ref 0–149)
VLDL Cholesterol Cal: 35 mg/dL (ref 5–40)

## 2018-12-27 ENCOUNTER — Other Ambulatory Visit: Payer: Self-pay

## 2018-12-27 ENCOUNTER — Ambulatory Visit (INDEPENDENT_AMBULATORY_CARE_PROVIDER_SITE_OTHER): Payer: BLUE CROSS/BLUE SHIELD | Admitting: Orthopaedic Surgery

## 2018-12-27 ENCOUNTER — Encounter (INDEPENDENT_AMBULATORY_CARE_PROVIDER_SITE_OTHER): Payer: Self-pay | Admitting: Orthopaedic Surgery

## 2018-12-27 VITALS — BP 135/82 | HR 79 | Ht 75.0 in | Wt >= 6400 oz

## 2018-12-27 DIAGNOSIS — M19012 Primary osteoarthritis, left shoulder: Secondary | ICD-10-CM

## 2018-12-27 NOTE — Progress Notes (Signed)
Office Visit Note   Patient: Richard Crawford           Date of Birth: 07-24-1956           MRN: [id number] Visit Date: 12/27/2018              Requested by: Baruch Gouty, [place], [place] [address] PCP: Baruch Gouty, FNP   Assessment & Plan: Visit Diagnoses:  1. Primary osteoarthritis, left shoulder     Plan: [name] has history of multiple joint arthritis and has had prior bilateral total knee replacements and a right shoulder replacement.  He does have significant arthritis of his left shoulder.  Prior films taken elsewhere.  He simply wanted to establish contact with another orthopedist as he is moved from [place] to this area.  Long discussion regarding his shoulder and treatment options over time.  For the moment he is happy with his anti-inflammatory medicine and will let us know if he wants any further treatment.  Follow-Up Instructions: Return if symptoms worsen or fail to improve.   Orders:  No orders of the defined types were placed in this encounter.  No orders of the defined types were placed in this encounter.     Procedures: No procedures performed   Clinical Data: No additional findings.   Subjective: Chief Complaint  Patient presents with  . Left Shoulder - Pain  Patient presents today with left shoulder pain. He just moved here from [facility] and wanted to come in today to get established for care. He has pain in his left shoulder joint and decreased range of motion. He has been told he needs a replacement, but does not want to do that. He is currently taking celebrex daily for his shoulder and states that it is working well for him. He has a history of right shoulder replacement in 2005.  Prior history of multiple joint arthralgias.  He has had a prior right total shoulder replacement and bilateral knee replacements.  He also has had a fusion of his left ankle and wears a splint.  His own admission he has end-stage arthritis  of his left shoulder and simply want to establish contact with an orthopedist as he is just recently moved from [place] to [place].  He is not interested in any further treatment at the present time  HPI  Review of Systems   Objective: Vital Signs: BP 135/82   Pulse 79   Ht 6\' 3"  (1.905 m)   Wt (!) 405 lb (183.7 kg)   BMI 50.62 kg/m   Physical Exam Constitutional:      Appearance: He is well-developed.  Eyes:     Pupils: Pupils are equal, round, and reactive to light.  Pulmonary:     Effort: Pulmonary effort is normal.  Skin:    General: Skin is warm and dry.  Neurological:     Mental Status: He is alert and oriented to person, place, and time.  Psychiatric:        Behavior: Behavior normal.     Ortho Exam awake alert and oriented x3.  Comfortable sitting.  Very limited range of motion of his left shoulder.  He had about 90 degrees of flexion and about 70 degrees of abduction.  Little if any external rotation and about 70 degrees of internal rotation.  There was grating and crepitation with motion.  Good grip and good release biceps intact.  Skin intact.  Multiple tattoos to both upper  extremities.  Painless range of motion of right shoulder Specialty Comments:  No specialty comments available.  Imaging: No results found.   PMFS History: Patient Active Problem List   Diagnosis Date Noted  . Morbid obesity with BMI of 50.0-59.9, adult ([place]) 12/15/2018  . History of colonic polyps 12/15/2018  . Skin abnormalities 12/15/2018  . Generalized anxiety disorder 06/29/2018  . Chronic heart failure with preserved ejection fraction ([place]) 05/01/2018  . Hyperlipidemia associated with type 2 diabetes mellitus ([place]) 02/08/2018  . Venous stasis dermatitis of both lower extremities 01/05/2018  . Obstructive sleep apnea 01/05/2018  . Papillary thyroid carcinoma ([place]) 11/29/2017  . Primary osteoarthritis of left shoulder 11/15/2017  . Combined forms of age-related cataract of  both eyes 11/14/2017  . Myopia with astigmatism and presbyopia, bilateral 11/14/2017  . Hypertension associated with diabetes ([place]) 09/26/2017  . Corns and callosity 09/26/2017  . Postoperative hypothyroidism 09/26/2017  . Type 2 diabetes mellitus without complication, without long-term current use of insulin ([place]) 09/26/2017  . Rapid atrial fibrillation ([place]) 09/16/2016   Past Medical History:  Diagnosis Date  . Anxiety   . Arthritis    left shoulder  . Atrial fibrillation ([place])   . Cancer ([place]) 1989   thyroid  . Cellulitis   . Chronic heart failure with preserved ejection fraction ([place])   . Diabetes mellitus without complication ([place])   . Hemorrhoids    external  . Hx of colonic polyps   . Hyperlipidemia   . Hypertension   . Migraine   . Papillary thyroid carcinoma ([place])   . Postoperative hypothyroidism   . Primary osteoarthritis of left shoulder   . Sleep apnea   . Venous stasis dermatitis of both lower extremities     Family History  Problem Relation Age of Onset  . Cancer Mother        breast, stomach, ovarian  . Hypertension Mother   . Dementia Mother   . Heart disease Father     Past Surgical History:  Procedure Laterality Date  . ANKLE FUSION    . KNEE ARTHROSCOPY    . SHOULDER ARTHROSCOPY     Social History   Occupational History  . Not on file  Tobacco Use  . Smoking status: Never Smoker  . Smokeless tobacco: Never Used  Substance and Sexual Activity  . Alcohol use: Not Currently  . Drug use: Never  . Sexual activity: Not on file

## 2019-01-11 ENCOUNTER — Ambulatory Visit: Payer: BLUE CROSS/BLUE SHIELD | Admitting: "Endocrinology

## 2019-02-01 ENCOUNTER — Other Ambulatory Visit: Payer: Self-pay | Admitting: Family Medicine

## 2019-02-01 MED ORDER — RIVAROXABAN 20 MG PO TABS: 20.0000 mg | ORAL_TABLET | Freq: Every day | ORAL | 3 refills | Status: DC

## 2019-02-01 NOTE — Telephone Encounter (Signed)
[facility]   Pt is needing a refill on rivaroxaban (XARELTO) 20 MG TABS tablet

## 2019-02-22 ENCOUNTER — Other Ambulatory Visit: Payer: Self-pay | Admitting: Family Medicine

## 2019-02-22 ENCOUNTER — Telehealth: Payer: Self-pay | Admitting: Orthopaedic Surgery

## 2019-02-22 DIAGNOSIS — E785 Hyperlipidemia, unspecified: Secondary | ICD-10-CM

## 2019-02-22 DIAGNOSIS — E1169 Type 2 diabetes mellitus with other specified complication: Secondary | ICD-10-CM

## 2019-02-22 MED ORDER — ATORVASTATIN CALCIUM 20 MG PO TABS: 20.0000 mg | ORAL_TABLET | Freq: Every day | ORAL | 1 refills | Status: DC

## 2019-02-22 NOTE — Telephone Encounter (Signed)
Please advise 

## 2019-02-22 NOTE — Telephone Encounter (Signed)
What is the name of the medication? Atorvastatin 20 mg Pharmacy wouldn't refill since it was under his old doctor  Have you contacted your pharmacy to request a refill? Yes  [facility] [place]   Patient notified that their request is being sent to the clinical staff for review and that they should receive a call once it is complete. If they do not receive a call within 24 hours they can check with their pharmacy or our office.

## 2019-02-22 NOTE — Telephone Encounter (Signed)
Patient called requesting prescription refill of Celebrex 200 mg capsules [facility] [address] [facility] [place].  Patient states he had an appointment with Dr. Durward Fortes on 12/27/18 in [place].

## 2019-02-22 NOTE — Telephone Encounter (Signed)
Please call him and tell him that it would be best to have celebrex prescribed by primary care doctor because of his multiple meds and xarelto

## 2019-02-22 NOTE — Telephone Encounter (Signed)
Historical med. OV & labs 12/15/18

## 2019-02-23 NOTE — Telephone Encounter (Signed)
LMOM

## 2019-02-26 ENCOUNTER — Other Ambulatory Visit: Payer: Self-pay | Admitting: Family Medicine

## 2019-02-26 MED ORDER — CELECOXIB 200 MG PO CAPS: 200.0000 mg | ORAL_CAPSULE | Freq: Every day | ORAL | 0 refills | Status: DC

## 2019-02-26 NOTE — Telephone Encounter (Signed)
Pharmacy [facility]   Pt is needing a refill on celecoxib (CELEBREX) 200 MG capsule

## 2019-02-27 ENCOUNTER — Ambulatory Visit (INDEPENDENT_AMBULATORY_CARE_PROVIDER_SITE_OTHER): Payer: BLUE CROSS/BLUE SHIELD | Admitting: "Endocrinology

## 2019-02-27 ENCOUNTER — Other Ambulatory Visit: Payer: Self-pay

## 2019-02-27 ENCOUNTER — Encounter: Payer: Self-pay | Admitting: "Endocrinology

## 2019-02-27 VITALS — Ht 75.0 in | Wt >= 6400 oz

## 2019-02-27 DIAGNOSIS — E890 Postprocedural hypothyroidism: Secondary | ICD-10-CM | POA: Diagnosis not present

## 2019-02-27 DIAGNOSIS — E1165 Type 2 diabetes mellitus with hyperglycemia: Secondary | ICD-10-CM | POA: Diagnosis not present

## 2019-02-27 NOTE — Progress Notes (Signed)
Endocrinology Consult Note       02/27/2019, 4:29 PM   Subjective:    Patient ID: Richard Crawford, male    DOB: 1955/12/20.  Richard Crawford is being seen in consultation for management of currently uncontrolled symptomatic diabetes requested by  Baruch Gouty, FNP.   Past Medical History:  Diagnosis Date  . Anxiety   . Arthritis    left shoulder  . Atrial fibrillation ([place])   . Cancer ([place]) 1989   thyroid  . Cellulitis   . Chronic heart failure with preserved ejection fraction ([place])   . Diabetes mellitus without complication ([place])   . Hemorrhoids    external  . Hx of colonic polyps   . Hyperlipidemia   . Hypertension   . Migraine   . Papillary thyroid carcinoma ([place])   . Postoperative hypothyroidism   . Primary osteoarthritis of left shoulder   . Sleep apnea   . Venous stasis dermatitis of both lower extremities     Past Surgical History:  Procedure Laterality Date  . ANKLE FUSION    . KNEE ARTHROSCOPY    . SHOULDER ARTHROSCOPY      Social History   Socioeconomic History  . Marital status: Married    Spouse name: Not on file  . Number of children: Not on file  . Years of education: Not on file  . Highest education level: Not on file  Occupational History  . Not on file  Social Needs  . Financial resource strain: Not on file  . Food insecurity:    Worry: Not on file    Inability: Not on file  . Transportation needs:    Medical: Not on file    Non-medical: Not on file  Tobacco Use  . Smoking status: Never Smoker  . Smokeless tobacco: Never Used  Substance and Sexual Activity  . Alcohol use: Not Currently  . Drug use: Never  . Sexual activity: Not on file  Lifestyle  . Physical activity:    Days per week: Not on file    Minutes per session: Not on file  . Stress: Not on file  Relationships  . Social connections:    Talks on phone: Not on file    Gets together: Not on  file    Attends religious service: Not on file    Active member of club or organization: Not on file    Attends meetings of clubs or organizations: Not on file    Relationship status: Not on file  Other Topics Concern  . Not on file  Social History Narrative  . Not on file    Family History  Problem Relation Age of Onset  . Cancer Mother        breast, stomach, ovarian  . Hypertension Mother   . Dementia Mother   . Heart disease Father     Outpatient Encounter Medications as of 02/27/2019  Medication Sig  . atorvastatin (LIPITOR) 20 MG tablet Take 1 tablet (20 mg total) by mouth daily.  . celecoxib (CELEBREX) 200 MG capsule Take 1 capsule (200 mg total) by mouth daily for 30 days.  Marland Kitchen  furosemide (LASIX) 80 MG tablet Take 80 mg by mouth 2 (two) times daily.  Marland Kitchen levothyroxine (SYNTHROID, LEVOTHROID) 112 MCG tablet Take 224 mcg by mouth daily.  . metFORMIN (GLUCOPHAGE-XR) 750 MG 24 hr tablet Take 750 mg by mouth daily with breakfast.  . metoprolol succinate (TOPROL-XL) 100 MG 24 hr tablet Take 200 mg by mouth daily. Take with or immediately following a meal.  . Multiple Vitamin (MULTIVITAMIN) tablet Take 1 tablet by mouth 2 (two) times daily.  . rivaroxaban (XARELTO) 20 MG TABS tablet Take 1 tablet (20 mg total) by mouth daily with supper for 30 days.  Marland Kitchen spironolactone (ALDACTONE) 25 MG tablet Take 25 mg by mouth daily.  . valsartan (DIOVAN) 80 MG tablet Take 80 mg by mouth daily.  . vitamin B-12 (CYANOCOBALAMIN) 500 MCG tablet Take 500 mcg by mouth daily.   No facility-administered encounter medications on file as of 02/27/2019.     ALLERGIES: Allergies  Allergen Reactions  . Morphine And Related Nausea And Vomiting    VACCINATION STATUS:  There is no immunization history on file for this patient.  Diabetes  He presents for his initial diabetic visit. He has type 2 diabetes mellitus. His disease course has been stable. There are no hypoglycemic associated symptoms. Pertinent  negatives for hypoglycemia include no confusion, headaches, pallor or seizures. Associated symptoms include fatigue. Pertinent negatives for diabetes include no chest pain, no polydipsia, no polyphagia, no polyuria and no weakness. There are no hypoglycemic complications. Symptoms are stable. There are no diabetic complications. Risk factors for coronary artery disease include diabetes mellitus, hypertension, male sex, obesity and sedentary lifestyle. Current diabetic treatment includes oral agent (monotherapy). His weight is fluctuating minimally. He is following a generally unhealthy diet. When asked about meal planning, he reported none. He has not had a previous visit with a dietitian (He declines referral to a dietitian.). (He does not monitor blood glucose.  His recent A1c was 6.7%.)   History of thyroid malignancy: He was diagnosed with thyroid malignancy and treated in [place] in 1989 involving surgery as well as radioactive iodine therapy from what he remembers.  The details of his diagnosis and treatment are not available to review. -He did not have any recent surveillance imaging studies.  He is currently on levothyroxine 224 mcg p.o. daily in the evening hours for postsurgical hypothyroidism.   -He complains of progressive weight gain, fatigue, and insists that he needs higher dose of thyroid hormone (because at one time he took up to 400 mcg and he felt better). -Patient has multiple medical problems including atrial fibrillation. -He denies dysphagia.  He has chronic sleep apnea on CPAP machine.    Review of Systems  Constitutional: Positive for fatigue. Negative for chills, fever and unexpected weight change.  HENT: Negative for dental problem, mouth sores and trouble swallowing.   Eyes: Negative for visual disturbance.  Respiratory: Positive for shortness of breath. Negative for cough, choking, chest tightness and wheezing.   Cardiovascular: Negative for chest pain, palpitations  and leg swelling.  Gastrointestinal: Negative for abdominal distention, abdominal pain, constipation, diarrhea, nausea and vomiting.  Endocrine: Negative for polydipsia, polyphagia and polyuria.  Genitourinary: Negative for dysuria, flank pain, hematuria and urgency.  Musculoskeletal: Negative for back pain, gait problem, myalgias and neck pain.  Skin: Negative for pallor, rash and wound.  Neurological: Negative for seizures, syncope, weakness, numbness and headaches.  Psychiatric/Behavioral: Negative for confusion and dysphoric mood.    Objective:    Ht 6\' 3"  (1.905  m)   Wt (!) 400 lb 3.2 oz (181.5 kg)   BMI 50.02 kg/m   Wt Readings from Last 3 Encounters:  02/27/19 (!) 400 lb 3.2 oz (181.5 kg)  12/27/18 (!) 405 lb (183.7 kg)  12/15/18 (!) 404 lb (183.3 kg)     Physical Exam Constitutional:      General: He is not in acute distress.    Appearance: He is well-developed.  HENT:     Head: Normocephalic and atraumatic.  Neck:     Musculoskeletal: Normal range of motion and neck supple.     Thyroid: No thyromegaly.     Trachea: No tracheal deviation.  Cardiovascular:     Rate and Rhythm: Normal rate.     Pulses:          Dorsalis pedis pulses are 1+ on the right side and 1+ on the left side.       Posterior tibial pulses are 1+ on the right side and 1+ on the left side.     Heart sounds: S1 normal and S2 normal. No murmur. No gallop.   Pulmonary:     Effort: Pulmonary effort is normal. No respiratory distress.     Breath sounds: No wheezing.  Abdominal:     General: There is no distension.     Tenderness: There is no abdominal tenderness. There is no guarding.  Musculoskeletal:     Right shoulder: He exhibits no swelling and no deformity.  Skin:    General: Skin is warm and dry.     Findings: No rash.     Nails: There is no clubbing.   Neurological:     Mental Status: He is alert and oriented to person, place, and time.     Cranial Nerves: No cranial nerve deficit.      Sensory: No sensory deficit.     Gait: Gait normal.     Deep Tendon Reflexes: Reflexes are normal and symmetric.  Psychiatric:        Speech: Speech normal.        Behavior: Behavior is cooperative.     Comments: He has reluctant affect.    CMP     Component Value Date/Time   NA 141 12/15/2018 1410   K 4.5 12/15/2018 1410   CL 100 12/15/2018 1410   CO2 28 12/15/2018 1410   GLUCOSE 223 (H) 12/15/2018 1410   BUN 19 12/15/2018 1410   CREATININE 1.14 12/15/2018 1410   CALCIUM 9.6 12/15/2018 1410   PROT 7.8 12/15/2018 1410   ALBUMIN 4.6 12/15/2018 1410   AST 20 12/15/2018 1410   ALT 16 12/15/2018 1410   ALKPHOS 109 12/15/2018 1410   BILITOT 0.6 12/15/2018 1410   GFRNONAA 68 12/15/2018 1410   GFRAA 78 12/15/2018 1410     Diabetic Labs (most recent): Lab Results  Component Value Date   HGBA1C 6.7 12/15/2018     Lipid Panel ( most recent) Lipid Panel     Component Value Date/Time   CHOL 105 12/15/2018 1410   TRIG 174 (H) 12/15/2018 1410   HDL 35 (L) 12/15/2018 1410   CHOLHDL 3.0 12/15/2018 1410   LDLCALC 35 12/15/2018 1410      Lab Results  Component Value Date   TSH 0.462 12/15/2018       Assessment & Plan:   1. Uncontrolled type 2 diabetes mellitus with hyperglycemia (HCC)  - Richard Crawford has currently uncontrolled symptomatic type 2 DM since  63 years of age,  with most  recent A1c of 6.7 %. Recent labs reviewed. - I had a long discussion with him about the progressive nature of diabetes and the pathology behind its complications. -his diabetes is complicated by obesity/sedentary life and he remains at a high risk for more acute and chronic complications which include CAD, CVA, CKD, retinopathy, and neuropathy. These are all discussed in detail with him.  - I have counseled him on diet management and weight loss, by adopting a carbohydrate restricted/protein rich diet. - he admits that there is a room for improvement in his food and drink choices. -  Suggestion is made for him to avoid simple carbohydrates  from his diet including Cakes, Sweet Desserts, Ice Cream, Soda (diet and regular), Sweet Tea, Candies, Chips, Cookies, Store Bought Juices, Alcohol in Excess of  1-2 drinks a day, Artificial Sweeteners,  Coffee Creamer, and "Sugar-free" Products. This will help patient to have more stable blood glucose profile and potentially avoid unintended weight gain.  - I encouraged him to switch to  unprocessed or minimally processed complex starch and increased protein intake (animal or plant source), fruits, and vegetables.  - he is advised to stick to a routine mealtimes to eat 3 meals  a day and avoid unnecessary snacks ( to snack only to correct hypoglycemia).   - he declined referral to dietitian.   - I have approached him with the following individualized plan to manage diabetes and patient agrees:   -Given his near target A1c of 6.7%, he will not need any injectable treatment at this time.  - he is advised to continue metformin 750 mg ER daily after breakfast, therapeutically suitable for patient .  - Patient specific target  A1c;  LDL, HDL, Triglycerides, and  Waist Circumference were discussed in detail.  2) Blood Pressure /Hypertension:  his blood pressure is controlled to target.   he is advised to continue his current medications including valsartan 80 mg p.o. daily, spironolactone 25 mg p.o. daily, metoprolol 100 mg XL p.o. daily, mg p.o. daily with breakfast .  3) Lipids/Hyperlipidemia:   Review of his recent lipid panel showed  controlled  LDL at 35 .  he  is advised to continue    atorvastatin 20 mg daily at bedtime.  Side effects and precautions discussed with him.  4)  Weight/Diet:  Body mass index is 50.02 kg/m.  -   clearly complicating his diabetes care.  I discussed with him the fact that loss of 5 - 10% of his  current body weight will have the most impact on his diabetes management.  CDE Consult will be initiated . Exercise,  and detailed carbohydrates information provided  -  detailed on discharge instructions.   5) history of thyroid cancer:   Details of his diagnosis and treatment are not available.  He was treated in [place] in 1989.  He is likely in remission. -He did not have any surveillance imaging studies in recent years.   -He is approached for Thyrogen stimulated whole-body scan as a baseline and he agrees. This work-up will be scheduled to be done in [facility] in subsequent weeks.  6) postsurgical hypothyroidism: -His recent thyroid function tests involving TSH and total T4 are consistent with appropriate replacement.  He is currently on levothyroxine 224 mcg p.o. daily, he insists that he needs more hormone to feel better. -I had a long discussion with him that thyroid hormone needs to be dosed properly based on lab work and not by symptoms or body  weight.  He reluctantly accepts this advice.  He will be sent to lab for TSH and free T4 and will be called for his lab results. In the meantime, he is advised to continue his levothyroxine 224 micrograms p.o. every morning.   - We discussed about the correct intake of his thyroid hormone, on empty stomach at fasting, with water, separated by at least 30 minutes from breakfast and other medications,  and separated by more than 4 hours from calcium, iron, multivitamins, acid reflux medications (PPIs). -Patient is made aware of the fact that thyroid hormone replacement is needed for life, dose to be adjusted by periodic monitoring of thyroid function tests.  7) Chronic Care/Health Maintenance:  -he  is on ACEI/ARB and Statin medications and  is encouraged to initiate and continue to follow up with Ophthalmology, Dentist,  Podiatrist at least yearly or according to recommendations, and advised to   stay away from smoking. I have recommended yearly flu vaccine and pneumonia vaccine at least every 5 years; moderate intensity exercise for up to 150  minutes weekly; and  sleep for at least 7 hours a day.  - he is  advised to maintain close follow up with Rakes, Connye Burkitt, FNP for primary care needs, as well as his other providers for optimal and coordinated care.  - Time spent with the patient: 45 minutes, of which >50% was spent in obtaining information about his symptoms, reviewing his previous labs/studies, evaluations, and treatments, counseling him about his type 2 diabetes, postsurgical hypothyroidism, history of thyroid malignancy, and developing plans for long term treatment based on the latest standards of care/guidelines.  Please refer to " Patient Self Inventory" in the Media  tab for reviewed elements of pertinent patient history.  Richard Crawford participated in the discussions, expressed understanding, and voiced agreement with the above plans.  All questions were answered to his satisfaction. he is encouraged to contact clinic should he have any questions or concerns prior to his return visit.  Follow up plan: - Return in about 5 weeks (around 04/03/2019) for Labs Today- Non-Fasting Ok, Follow up with Whole Body Scan w/Thyrogen.  Glade Lloyd, MD [facility] [address] [place], [place] [address] Phone: [phone]  Fax: [phone]    02/27/2019, 4:29 PM  This note was partially dictated with voice recognition software. Similar sounding words can be transcribed inadequately or may not  be corrected upon review.

## 2019-02-28 DIAGNOSIS — E890 Postprocedural hypothyroidism: Secondary | ICD-10-CM | POA: Diagnosis not present

## 2019-02-28 LAB — TSH: TSH: 2.95 mIU/L (ref 0.40–4.50)

## 2019-02-28 LAB — T4, FREE: Free T4: 1.2 ng/dL (ref 0.8–1.8)

## 2019-03-06 ENCOUNTER — Telehealth: Payer: Self-pay | Admitting: Cardiology

## 2019-03-06 NOTE — Telephone Encounter (Signed)

## 2019-03-06 NOTE — Progress Notes (Signed)
Virtual Visit via Video Note   This visit type was conducted due to national recommendations for restrictions regarding the COVID-19 Pandemic (e.g. social distancing) in an effort to limit this patient's exposure and mitigate transmission in our community.  Due to his co-morbid illnesses, this patient is at least at moderate risk for complications without adequate follow up.  This format is felt to be most appropriate for this patient at this time.  All issues noted in this document were discussed and addressed.  A limited physical exam was performed with this format.  Please refer to the patient's chart for his consent to telehealth for Richard Crawford.   Date:  03/07/2019   ID:  Richard Crawford, DOB 09/13/1956, MRN [id number]  Patient Location: Home Provider Location: Home  PCP:  Baruch Gouty, FNP  Cardiologist:  Minus Breeding, MD  Electrophysiologist:  None   Evaluation Performed:  Follow-Up Visit  Chief Complaint:  Atrial fib  History of Present Illness:    Richard Crawford is a 63 y.o. male with with a long history of atrial fib.  He has had this for years and was treated in [place].   I was able to review some records from Richard Crawford.  He is switching his care here.  He has had multiple ablations.  He has been treated with Betapace.  It does not sound like he is had other management and he was never offered ablation.  However, he has had rate control anticoagulation.  He had no problems with this.  He had thyroid cancer treated.  He has resultant hypothyroidism.  His biggest complaint is that he has severe fatigue.  He says that he does not do well when he is on a lower dose of Synthroid and that they have taken him down from 4 to 2 pills over time.  He says he does not do well with this and is always wanted more.  However, I reviewed some labs recently and his TSH and T4 were normal a couple of days ago.  Other labs were unremarkable.  He says his sleep mask has been adjusted and working  fine.  He does not feel any palpitations, presyncope or syncope.  He might get a little chest discomfort if his ex-wife stresses him out.  He says he has anger issues.  He denies any neck or arm discomfort.  He does not have any PND or orthopnea.  The patient does not have symptoms concerning for COVID-19 infection (fever, chills, cough, or new shortness of breath).    Past Medical History:  Diagnosis Date   Anxiety    Arthritis    left shoulder   Atrial fibrillation ([place])    Cancer ([place]) 1989   thyroid   Cellulitis    Chronic heart failure with preserved ejection fraction (HCC)    Diabetes mellitus without complication (HCC)    Hemorrhoids    external   Hx of colonic polyps    Hyperlipidemia    Hypertension    Migraine    Papillary thyroid carcinoma (HCC)    Postoperative hypothyroidism    Primary osteoarthritis of left shoulder    Sleep apnea    Venous stasis dermatitis of both lower extremities    Past Surgical History:  Procedure Laterality Date   ANKLE FUSION     KNEE ARTHROSCOPY     SHOULDER ARTHROSCOPY       Prior to Admission medications   Medication Sig Start Date End Date Taking? Authorizing Provider  atorvastatin (LIPITOR) 20 MG tablet Take 1 tablet (20 mg total) by mouth daily. 02/22/19  Yes Rakes, Connye Burkitt, FNP  celecoxib (CELEBREX) 200 MG capsule Take 1 capsule (200 mg total) by mouth daily for 30 days. 02/26/19 03/28/19 Yes Rakes, Connye Burkitt, FNP  furosemide (LASIX) 80 MG tablet Take 80 mg by mouth 2 (two) times daily.   Yes [provider]  levothyroxine (SYNTHROID, LEVOTHROID) 112 MCG tablet Take 224 mcg by mouth daily. 08/31/18  Yes [provider]  metFORMIN (GLUCOPHAGE-XR) 750 MG 24 hr tablet Take 750 mg by mouth daily with breakfast.   Yes [provider]  metoprolol succinate (TOPROL-XL) 100 MG 24 hr tablet Take 200 mg by mouth daily. Take with or immediately following a meal.   Yes [provider]    Multiple Vitamin (MULTIVITAMIN) tablet Take 1 tablet by mouth 2 (two) times daily.   Yes [provider]  rivaroxaban (XARELTO) 20 MG TABS tablet Take 1 tablet (20 mg total) by mouth daily with supper for 30 days. 02/01/19 03/07/19 Yes Rakes, Connye Burkitt, FNP  spironolactone (ALDACTONE) 25 MG tablet Take 25 mg by mouth daily.   Yes [provider]  valsartan (DIOVAN) 80 MG tablet Take 80 mg by mouth daily.   Yes [provider]  vitamin B-12 (CYANOCOBALAMIN) 500 MCG tablet Take 500 mcg by mouth daily.   Yes [provider]    Allergies:   Morphine and related   Social History   Tobacco Use   Smoking status: Never Smoker   Smokeless tobacco: Never Used  Substance Use Topics   Alcohol use: Not Currently   Drug use: Never     Family Hx: The patient's family history includes Cancer in his mother; Dementia in his mother; Heart disease in his father; Hypertension in his mother.  ROS:   Please see the history of present illness.    As stated in the HPI and negative for all other systems.   Prior CV studies:   The following studies were reviewed today:  Care Everywhere  Labs/Other Tests and Data Reviewed:    EKG:  No ECG reviewed.  Recent Labs: 12/15/2018: ALT 16; BUN 19; Creatinine, Ser 1.14; Hemoglobin 14.6; Platelets 241; Potassium 4.5; Sodium 141 02/28/2019: TSH 2.95   Recent Lipid Panel Lab Results  Component Value Date/Time   CHOL 105 12/15/2018 02:10 PM   TRIG 174 (H) 12/15/2018 02:10 PM   HDL 35 (L) 12/15/2018 02:10 PM   CHOLHDL 3.0 12/15/2018 02:10 PM   LDLCALC 35 12/15/2018 02:10 PM    Wt Readings from Last 3 Encounters:  03/07/19 (!) 400 lb (181.4 kg)  02/27/19 (!) 400 lb 3.2 oz (181.5 kg)  12/27/18 (!) 405 lb (183.7 kg)     Objective:    Vital Signs:  Ht 6\' 3"  (1.905 m)    Wt (!) 400 lb (181.4 kg)    BMI 50.00 kg/m    VITAL SIGNS:  reviewed GEN:  no acute distress RESPIRATORY:  normal respiratory effort, symmetric  expansion NEURO:  alert and oriented x 3, no obvious focal deficit PSYCH:  normal affect  ASSESSMENT & PLAN:    ATRIAL FIB:   He is in chronic atrial fibrillation.  However, he does not really feel this.  It is possible however that his significant fatigue is related to his beta-blocker.  I would suggest that he try Cardizem instead.  We talked about how he would watch his heart rate by getting potentially a FitBit.  He tolerates anticoagulation and has no reason to switch.  HTN:  The blood pressure is at target. No change in medications is indicated. We will continue with therapeutic lifestyle changes (TLC).  We will watch this with his med change.  He has a blood pressure cuff but it looked too small.  DYSLIPIDEMIA:   Lipids were excellent.  No change in therapy.  SLEEP APNEA: He has a sleep doctor and has this followed.  CHRONIC DIASTOLIC HF:    He watches his salt.  He has had diastolic heart failure talked about this.  By history he is euvolemic.  COVID-19 Education: The signs and symptoms of COVID-19 were discussed with the patient and how to seek care for testing (follow up with PCP or arrange E-visit).  The importance of social distancing was discussed today.  Time:   Today, I have spent 25 minutes with the patient with telehealth technology discussing the above problems.     Medication Adjustments/Labs and Tests Ordered: Current medicines are reviewed at length with the patient today.  Concerns regarding medicines are outlined above.   Tests Ordered: No orders of the defined types were placed in this encounter.   Medication Changes: No orders of the defined types were placed in this encounter.   Disposition:  Follow up six week with me virtual visit  Signed, Minus Breeding, MD  03/07/2019 1:19 PM    [facility]

## 2019-03-07 ENCOUNTER — Encounter: Payer: Self-pay | Admitting: Cardiology

## 2019-03-07 ENCOUNTER — Telehealth (INDEPENDENT_AMBULATORY_CARE_PROVIDER_SITE_OTHER): Payer: BLUE CROSS/BLUE SHIELD | Admitting: Cardiology

## 2019-03-07 VITALS — Ht 75.0 in | Wt >= 6400 oz

## 2019-03-07 DIAGNOSIS — Z7189 Other specified counseling: Secondary | ICD-10-CM

## 2019-03-07 DIAGNOSIS — I4821 Permanent atrial fibrillation: Secondary | ICD-10-CM | POA: Diagnosis not present

## 2019-03-07 DIAGNOSIS — R5383 Other fatigue: Secondary | ICD-10-CM | POA: Diagnosis not present

## 2019-03-07 DIAGNOSIS — I5032 Chronic diastolic (congestive) heart failure: Secondary | ICD-10-CM | POA: Diagnosis not present

## 2019-03-07 MED ORDER — DILTIAZEM HCL ER COATED BEADS 360 MG PO CP24: 360.0000 mg | ORAL_CAPSULE | Freq: Every day | ORAL | 3 refills | Status: DC

## 2019-03-07 NOTE — Patient Instructions (Signed)
Medication Instructions:  Please discontinue your Metoprolol and start Cardizem 360 mg by mouth daily.  Continue any other medications as listed.  If you need a refill on your cardiac medications before your next appointment, please call your pharmacy.   Follow-Up: Follow up with Dr Percival Spanish in 6 weeks.  Thank you for choosing [facility]!!

## 2019-03-09 DIAGNOSIS — I5032 Chronic diastolic (congestive) heart failure: Secondary | ICD-10-CM | POA: Insufficient documentation

## 2019-03-09 DIAGNOSIS — R5383 Other fatigue: Secondary | ICD-10-CM | POA: Insufficient documentation

## 2019-03-09 DIAGNOSIS — Z7189 Other specified counseling: Secondary | ICD-10-CM | POA: Insufficient documentation

## 2019-03-09 DIAGNOSIS — I4821 Permanent atrial fibrillation: Secondary | ICD-10-CM | POA: Insufficient documentation

## 2019-03-12 ENCOUNTER — Other Ambulatory Visit: Payer: Self-pay | Admitting: Family Medicine

## 2019-03-12 MED ORDER — METFORMIN HCL ER 750 MG PO TB24: 750.0000 mg | ORAL_TABLET | Freq: Every day | ORAL | 0 refills | Status: DC

## 2019-03-12 NOTE — Telephone Encounter (Signed)
What is the name of the medication? Metformin 750 mg   Have you contacted your pharmacy to request a refill? NO  Which pharmacy would you like this sent to? [facility] in [place]   Patient notified that their request is being sent to the clinical staff for review and that they should receive a call once it is complete. If they do not receive a call within 24 hours they can check with their pharmacy or our office.

## 2019-03-19 ENCOUNTER — Other Ambulatory Visit: Payer: Self-pay

## 2019-03-20 ENCOUNTER — Other Ambulatory Visit: Payer: Self-pay

## 2019-03-20 ENCOUNTER — Encounter: Payer: Self-pay | Admitting: Family Medicine

## 2019-03-20 ENCOUNTER — Ambulatory Visit: Payer: BLUE CROSS/BLUE SHIELD | Admitting: Family Medicine

## 2019-03-20 VITALS — BP 149/86 | HR 83 | Temp 98.4°F | Ht 75.0 in | Wt >= 6400 oz

## 2019-03-20 DIAGNOSIS — R202 Paresthesia of skin: Secondary | ICD-10-CM | POA: Diagnosis not present

## 2019-03-20 DIAGNOSIS — E119 Type 2 diabetes mellitus without complications: Secondary | ICD-10-CM | POA: Diagnosis not present

## 2019-03-20 DIAGNOSIS — E890 Postprocedural hypothyroidism: Secondary | ICD-10-CM

## 2019-03-20 DIAGNOSIS — L918 Other hypertrophic disorders of the skin: Secondary | ICD-10-CM | POA: Diagnosis not present

## 2019-03-20 DIAGNOSIS — I10 Essential (primary) hypertension: Secondary | ICD-10-CM

## 2019-03-20 DIAGNOSIS — E1159 Type 2 diabetes mellitus with other circulatory complications: Secondary | ICD-10-CM | POA: Diagnosis not present

## 2019-03-20 DIAGNOSIS — R5383 Other fatigue: Secondary | ICD-10-CM

## 2019-03-20 DIAGNOSIS — I152 Hypertension secondary to endocrine disorders: Secondary | ICD-10-CM

## 2019-03-20 LAB — BAYER DCA HB A1C WAIVED: HB A1C (BAYER DCA - WAIVED): 6.9 % (ref <7.0)

## 2019-03-20 MED ORDER — FUROSEMIDE 80 MG PO TABS: 80.0000 mg | ORAL_TABLET | Freq: Every day | ORAL | 3 refills | Status: DC

## 2019-03-20 MED ORDER — VALSARTAN 80 MG PO TABS: 80.0000 mg | ORAL_TABLET | Freq: Every day | ORAL | 3 refills | Status: DC

## 2019-03-20 MED ORDER — SPIRONOLACTONE 25 MG PO TABS: 25.0000 mg | ORAL_TABLET | Freq: Every day | ORAL | 3 refills | Status: DC

## 2019-03-20 NOTE — Patient Instructions (Signed)

## 2019-03-20 NOTE — Progress Notes (Signed)
Subjective:  Patient ID: Richard Crawford, male    DOB: [birth year], 63 y.o.   MRN: [id number]  Chief Complaint:  Medical Management of Chronic Issues   HPI: Richard Crawford is a 63 y.o. male presenting on 03/20/2019 for Medical Management of Chronic Issues   1. Type 2 diabetes mellitus without complication, without long-term current use of insulin (HCC)  Diabetes mellitus 2 Compliant with meds - Yes Checking CBGs? No Exercising regularly? - No Watching carbohydrate intake? - No Neuropathy ? - No Hypoglycemic events - No Pertinent ROS:  Polyuria - No Polydipsia - No Vision problems - No Had visit with endocrinology and has a follow up scheduled.    2. Hypertension associated with diabetes ([place])  Complaint with meds - Yes Checking BP at home - No Exercising Regularly - No Watching Salt intake - No Pertinent ROS:  Headache - No Chest pain - No Dyspnea - No Palpitations - No LE edema - Yes They report good compliance with medications and can restate their regimen by memory. No medication side effects.  BP Readings from Last 3 Encounters:  03/20/19 (!) 149/86  12/27/18 135/82  12/15/18 131/78     3. Paresthesia of hand, bilateral  Intermittent. Feels this is due to inadequate dosing of Synthroid although labs are normal. Pt feels he needs a higher dose of Synthroid. Pt states he does not have weakness of loss of function, just tingling in his hands at times.    4. Other fatigue  Ongoing. Does not feel this is related to CPAP. Feels it is related to thyroid. Cardiology did change his metoprolol to cardizem to see if this would help. Pt states he has not noticed a difference. He states he was on testosterone therapy in the past and this did not make a significant change so he stopped. He does not wish to do this again.   5. Postoperative hypothyroidism  Had an appointment with endocrinology and has a follow up scheduled. Will have imaging completed at this time.    6. Lack  of energy  Ongoing for several months. States he needs juiced up with thyroid medications.    7. Inflamed skin tag  Multiple skin tags to body. Pt has one under right axilla that is inflamed due to getting hung on clothing. No bleeding or drainage. Mild surrounding erythema.      Relevant past medical, surgical, family, and social history reviewed and updated as indicated.  Allergies and medications reviewed and updated.   Past Medical History:  Diagnosis Date  . Anxiety   . Arthritis    left shoulder  . Atrial fibrillation ([place])   . Cancer ([place]) 1989   thyroid  . Cellulitis   . Chronic heart failure with preserved ejection fraction ([place])   . Diabetes mellitus without complication ([place])   . Hemorrhoids    external  . Hx of colonic polyps   . Hyperlipidemia   . Hypertension   . Migraine   . Papillary thyroid carcinoma ([place])   . Postoperative hypothyroidism   . Primary osteoarthritis of left shoulder   . Sleep apnea   . Venous stasis dermatitis of both lower extremities     Past Surgical History:  Procedure Laterality Date  . ANKLE FUSION    . KNEE ARTHROSCOPY    . SHOULDER ARTHROSCOPY      Social History   Socioeconomic History  . Marital status: Married    Spouse name: Not on file  . Number  of children: Not on file  . Years of education: Not on file  . Highest education level: Not on file  Occupational History  . Not on file  Social Needs  . Financial resource strain: Not on file  . Food insecurity:    Worry: Not on file    Inability: Not on file  . Transportation needs:    Medical: Not on file    Non-medical: Not on file  Tobacco Use  . Smoking status: Never Smoker  . Smokeless tobacco: Never Used  Substance and Sexual Activity  . Alcohol use: Not Currently  . Drug use: Never  . Sexual activity: Not on file  Lifestyle  . Physical activity:    Days per week: Not on file    Minutes per session: Not on file  . Stress: Not on file  Relationships   . Social connections:    Talks on phone: Not on file    Gets together: Not on file    Attends religious service: Not on file    Active member of club or organization: Not on file    Attends meetings of clubs or organizations: Not on file    Relationship status: Not on file  . Intimate partner violence:    Fear of current or ex partner: Not on file    Emotionally abused: Not on file    Physically abused: Not on file    Forced sexual activity: Not on file  Other Topics Concern  . Not on file  Social History Narrative  . Not on file    Outpatient Encounter Medications as of 03/20/2019  Medication Sig  . atorvastatin (LIPITOR) 20 MG tablet Take 1 tablet (20 mg total) by mouth daily.  . celecoxib (CELEBREX) 200 MG capsule Take 1 capsule (200 mg total) by mouth daily for 30 days.  Marland Kitchen diltiazem (CARDIZEM CD) 360 MG 24 hr capsule Take 1 capsule (360 mg total) by mouth daily.  . furosemide (LASIX) 80 MG tablet Take 1 tablet (80 mg total) by mouth daily for 30 days.  Marland Kitchen levothyroxine (SYNTHROID, LEVOTHROID) 112 MCG tablet Take 224 mcg by mouth daily.  . metFORMIN (GLUCOPHAGE-XR) 750 MG 24 hr tablet Take 1 tablet (750 mg total) by mouth daily with breakfast.  . Multiple Vitamin (MULTIVITAMIN) tablet Take 1 tablet by mouth 2 (two) times daily.  . rivaroxaban (XARELTO) 20 MG TABS tablet Take 1 tablet (20 mg total) by mouth daily with supper for 30 days.  Marland Kitchen spironolactone (ALDACTONE) 25 MG tablet Take 1 tablet (25 mg total) by mouth daily for 30 days.  . valsartan (DIOVAN) 80 MG tablet Take 1 tablet (80 mg total) by mouth daily for 30 days.  . vitamin B-12 (CYANOCOBALAMIN) 500 MCG tablet Take 500 mcg by mouth daily.  . [DISCONTINUED] furosemide (LASIX) 80 MG tablet Take 80 mg by mouth 2 (two) times daily.  . [DISCONTINUED] spironolactone (ALDACTONE) 25 MG tablet Take 25 mg by mouth daily.  . [DISCONTINUED] valsartan (DIOVAN) 80 MG tablet Take 80 mg by mouth daily.   No facility-administered  encounter medications on file as of 03/20/2019.     Allergies  Allergen Reactions  . Morphine And Related Nausea And Vomiting    Review of Systems      Objective:  BP (!) 149/86   Pulse 83   Temp 98.4 F (36.9 C) (Oral)   Ht 6\' 3"  (1.905 m)   Wt (!) 410 lb (186 kg)   BMI 51.25 kg/m  Wt Readings from Last 3 Encounters:  03/20/19 (!) 410 lb (186 kg)  03/07/19 (!) 400 lb (181.4 kg)  02/27/19 (!) 400 lb 3.2 oz (181.5 kg)     Physical Exam Vitals signs and nursing note reviewed.  Constitutional:      General: He is not in acute distress.    Appearance: Normal appearance. He is well-developed and well-groomed. He is morbidly obese. He is not ill-appearing, toxic-appearing or diaphoretic.  HENT:     Head: Normocephalic and atraumatic.     Jaw: There is normal jaw occlusion.     Right Ear: Hearing normal.     Left Ear: Hearing normal.     Nose: Nose normal.     Mouth/Throat:     Lips: Pink.     Mouth: Mucous membranes are moist.     Pharynx: Oropharynx is clear. Uvula midline.  Eyes:     General: Lids are normal.     Extraocular Movements: Extraocular movements intact.     Conjunctiva/sclera: Conjunctivae normal.     Pupils: Pupils are equal, round, and reactive to light.  Neck:     Musculoskeletal: Normal range of motion and neck supple.     Thyroid: No thyroid mass, thyromegaly or thyroid tenderness.     Vascular: No carotid bruit or JVD.     Trachea: Trachea and phonation normal.  Cardiovascular:     Rate and Rhythm: Normal rate and regular rhythm.     Chest Wall: PMI is not displaced.     Pulses: Normal pulses.     Heart sounds: Normal heart sounds. No murmur. No friction rub. No gallop.   Pulmonary:     Effort: Pulmonary effort is normal. No respiratory distress.     Breath sounds: Normal breath sounds. No wheezing.  Abdominal:     General: Bowel sounds are normal. There is no distension or abdominal bruit.     Palpations: Abdomen is soft. There is no  hepatomegaly or splenomegaly.     Tenderness: There is no abdominal tenderness. There is no right CVA tenderness or left CVA tenderness.     Hernia: No hernia is present.  Musculoskeletal: Normal range of motion.     Right lower leg: 2+ Edema present.     Left lower leg: 2+ Edema present.  Lymphadenopathy:     Cervical: No cervical adenopathy.  Skin:    General: Skin is warm and dry.     Capillary Refill: Capillary refill takes less than 2 seconds.     Coloration: Skin is not cyanotic, jaundiced or pale.     Findings: No rash.     Comments: Multiple skin tags of varying sizes. One under right axilla with erythema, no drainage or bleeding.   Neurological:     General: No focal deficit present.     Mental Status: He is alert and oriented to person, place, and time.     Cranial Nerves: Cranial nerves are intact.     Sensory: Sensation is intact.     Motor: Motor function is intact.     Coordination: Coordination is intact.     Gait: Gait is intact.     Deep Tendon Reflexes: Reflexes are normal and symmetric.  Psychiatric:        Attention and Perception: Attention and perception normal.        Mood and Affect: Mood and affect normal.        Speech: Speech normal.        Behavior:  Behavior normal. Behavior is cooperative.        Thought Content: Thought content normal.        Cognition and Memory: Cognition and memory normal.        Judgment: Judgment normal.      Results for orders placed or performed in visit on 02/27/19  TSH  Result Value Ref Range   TSH 2.95 0.40 - 4.50 mIU/L  T4, free  Result Value Ref Range   Free T4 1.2 0.8 - 1.8 ng/dL   Verbal consent obtained for procedure after risks and benefits discussed. pts name, DOB, allergies, and procedure verified. Skin tag removal: Excised one skin tag using forceps and 11 blade. Used silver nitrate sticks for hemostasis and pressure dressing with topical antibiotic. Patient tolerated well and had minimal bleeding.    Pertinent labs & imaging results that were available during my care of the patient were reviewed by me and considered in my medical decision making.  Assessment & Plan:  C[place]son was seen today for medical management of chronic issues.  Diagnoses and all orders for this visit:  Type 2 diabetes mellitus without complication, without long-term current use of insulin (HCC) A1C 6.9 today. Diet and exercise encouraged. Continue medications as prescribed. Keep follow up with Dr. Dorris Fetch. Report any persistent high or low readings.  -     Bayer DCA Hb A1c Waived  Hypertension associated with diabetes ([place]) Diet and exercise encouraged. Continue below. Monitor blood pressure and report any persistent high or low readings. -     valsartan (DIOVAN) 80 MG tablet; Take 1 tablet (80 mg total) by mouth daily for 30 days. -     spironolactone (ALDACTONE) 25 MG tablet; Take 1 tablet (25 mg total) by mouth daily for 30 days. -     furosemide (LASIX) 80 MG tablet; Take 1 tablet (80 mg total) by mouth daily for 30 days.  Paresthesia of hand, bilateral Pt concerned this is due to inadequate dosing of his synthroid. TSH and T4 normal at last 2 visits. Will recheck today along with Vit B12 and Vit D. Report any new or worsening symptoms.  -     Thyroid Panel With TSH -     VITAMIN D 25 Hydroxy (Vit-D Deficiency, Fractures) -     Vitamin B12  Other fatigue Labs pending. Contact sleep doctor to see if CPAP needs adjusted.  -     Thyroid Panel With TSH -     VITAMIN D 25 Hydroxy (Vit-D Deficiency, Fractures) -     Vitamin B12  Postoperative hypothyroidism -     Thyroid Panel With TSH  Lack of energy -     Thyroid Panel With TSH -     VITAMIN D 25 Hydroxy (Vit-D Deficiency, Fractures) -     Vitamin B12  Inflamed skin tag Skin tag removed in office without complications. Bleeding well controlled after procedure. Wound care discussed.     Continue all other maintenance medications.  Follow up plan:  Return in about 3 months (around 06/20/2019), or if symptoms worsen or fail to improve, for DM, HTN, Thyroid.  Educational handout given for weight loss  The above assessment and management plan was discussed with the patient. The patient verbalized understanding of and has agreed to the management plan. Patient is aware to call the clinic if symptoms persist or worsen. Patient is aware when to return to the clinic for a follow-up visit. Patient educated on when it is appropriate to go  to the emergency department.   Monia Pouch, FNP-C [facility] [phone]

## 2019-03-21 ENCOUNTER — Ambulatory Visit: Payer: Self-pay | Admitting: Cardiology

## 2019-03-21 ENCOUNTER — Encounter: Payer: Self-pay | Admitting: Family Medicine

## 2019-03-21 ENCOUNTER — Ambulatory Visit (HOSPITAL_COMMUNITY)
Admission: RE | Admit: 2019-03-21 | Discharge: 2019-03-21 | Disposition: A | Discharge status: Home / Self Care | Payer: BC Managed Care – PPO | Source: Ambulatory Visit | Attending: "Endocrinology | Admitting: "Endocrinology

## 2019-03-21 DIAGNOSIS — E890 Postprocedural hypothyroidism: Secondary | ICD-10-CM | POA: Insufficient documentation

## 2019-03-21 DIAGNOSIS — H35033 Hypertensive retinopathy, bilateral: Secondary | ICD-10-CM | POA: Insufficient documentation

## 2019-03-21 LAB — VITAMIN B12: Vitamin B-12: 1682 pg/mL — ABNORMAL HIGH (ref 232–1245)

## 2019-03-21 LAB — THYROID PANEL WITH TSH
Free Thyroxine Index: 1.7 (ref 1.2–4.9)
T3 Uptake Ratio: 23 % — ABNORMAL LOW (ref 24–39)
T4, Total: 7.6 ug/dL (ref 4.5–12.0)
TSH: 3.89 u[IU]/mL (ref 0.450–4.500)

## 2019-03-21 LAB — VITAMIN D 25 HYDROXY (VIT D DEFICIENCY, FRACTURES): Vit D, 25-Hydroxy: 39.1 ng/mL (ref 30.0–100.0)

## 2019-03-21 MED ORDER — THYROTROPIN ALFA 1.1 MG IM SOLR
INTRAMUSCULAR | Status: AC
  Administered 2019-03-21: 09:00:00 0.9 mg via INTRAMUSCULAR
  Filled 2019-03-21: qty 0.9

## 2019-03-21 MED ORDER — THYROTROPIN ALFA 1.1 MG IM SOLR
0.9000 mg | INTRAMUSCULAR | Status: AC
  Administered 2019-03-21: 09:00:00 0.9 mg via INTRAMUSCULAR

## 2019-03-21 MED ORDER — STERILE WATER FOR INJECTION IJ SOLN
INTRAMUSCULAR | Status: AC
  Administered 2019-03-21: 1 mL
  Filled 2019-03-21: qty 10

## 2019-03-22 ENCOUNTER — Encounter (HOSPITAL_COMMUNITY): Payer: Self-pay

## 2019-03-22 ENCOUNTER — Other Ambulatory Visit: Payer: Self-pay

## 2019-03-22 ENCOUNTER — Encounter (HOSPITAL_COMMUNITY)
Admission: RE | Admit: 2019-03-22 | Discharge: 2019-03-22 | Disposition: A | Discharge status: Home / Self Care | Payer: BC Managed Care – PPO | Source: Ambulatory Visit | Attending: "Endocrinology | Admitting: "Endocrinology

## 2019-03-22 DIAGNOSIS — E890 Postprocedural hypothyroidism: Secondary | ICD-10-CM | POA: Diagnosis not present

## 2019-03-22 MED ORDER — THYROTROPIN ALFA 1.1 MG IM SOLR
INTRAMUSCULAR | Status: AC
  Administered 2019-03-22: 0.9 mg via INTRAMUSCULAR
  Filled 2019-03-22: qty 0.9

## 2019-03-22 MED ORDER — THYROTROPIN ALFA 1.1 MG IM SOLR
0.9000 mg | INTRAMUSCULAR | Status: AC
  Administered 2019-03-22: 09:00:00 0.9 mg via INTRAMUSCULAR

## 2019-03-22 MED ORDER — STERILE WATER FOR INJECTION IJ SOLN
INTRAMUSCULAR | Status: AC
  Administered 2019-03-22: 1 mL
  Filled 2019-03-22: qty 10

## 2019-03-23 ENCOUNTER — Ambulatory Visit (HOSPITAL_COMMUNITY)
Admission: RE | Admit: 2019-03-23 | Discharge: 2019-03-23 | Disposition: A | Discharge status: Home / Self Care | Payer: BC Managed Care – PPO | Source: Ambulatory Visit | Attending: "Endocrinology | Admitting: "Endocrinology

## 2019-03-23 ENCOUNTER — Encounter (HOSPITAL_COMMUNITY): Payer: Self-pay

## 2019-03-23 ENCOUNTER — Other Ambulatory Visit: Payer: Self-pay | Admitting: Family Medicine

## 2019-03-23 ENCOUNTER — Other Ambulatory Visit (HOSPITAL_COMMUNITY)
Admission: RE | Admit: 2019-03-23 | Discharge: 2019-03-23 | Disposition: A | Discharge status: Home / Self Care | Payer: BC Managed Care – PPO | Source: Ambulatory Visit | Attending: "Endocrinology | Admitting: "Endocrinology

## 2019-03-23 DIAGNOSIS — E890 Postprocedural hypothyroidism: Secondary | ICD-10-CM | POA: Insufficient documentation

## 2019-03-23 MED ORDER — SODIUM IODIDE I 131 CAPSULE
4.0000 | Freq: Once | INTRAVENOUS | Status: AC | PRN
  Administered 2019-03-23: 4.51 via ORAL

## 2019-03-26 ENCOUNTER — Other Ambulatory Visit: Payer: Self-pay

## 2019-03-26 ENCOUNTER — Ambulatory Visit (HOSPITAL_COMMUNITY)
Admission: RE | Admit: 2019-03-26 | Discharge: 2019-03-26 | Disposition: A | Discharge status: Home / Self Care | Payer: BC Managed Care – PPO | Source: Ambulatory Visit | Attending: "Endocrinology | Admitting: "Endocrinology

## 2019-03-26 DIAGNOSIS — E890 Postprocedural hypothyroidism: Secondary | ICD-10-CM | POA: Diagnosis not present

## 2019-03-26 DIAGNOSIS — Z85850 Personal history of malignant neoplasm of thyroid: Secondary | ICD-10-CM | POA: Diagnosis not present

## 2019-03-26 LAB — THYROGLOBULIN ANTIBODY: Thyroglobulin Antibody: <1 IU/mL (ref 0.0–0.9)

## 2019-03-29 ENCOUNTER — Encounter: Payer: Self-pay | Admitting: *Deleted

## 2019-03-29 LAB — THYROGLOBULIN LEVEL: Thyroglobulin: <2 ng/mL

## 2019-04-03 ENCOUNTER — Other Ambulatory Visit: Payer: Self-pay

## 2019-04-03 ENCOUNTER — Encounter: Payer: Self-pay | Admitting: "Endocrinology

## 2019-04-03 ENCOUNTER — Ambulatory Visit (INDEPENDENT_AMBULATORY_CARE_PROVIDER_SITE_OTHER): Payer: BC Managed Care – PPO | Admitting: "Endocrinology

## 2019-04-03 VITALS — BP 127/72 | HR 84 | Ht 75.0 in | Wt >= 6400 oz

## 2019-04-03 DIAGNOSIS — E890 Postprocedural hypothyroidism: Secondary | ICD-10-CM | POA: Diagnosis not present

## 2019-04-03 DIAGNOSIS — Z85850 Personal history of malignant neoplasm of thyroid: Secondary | ICD-10-CM | POA: Diagnosis not present

## 2019-04-03 DIAGNOSIS — E1165 Type 2 diabetes mellitus with hyperglycemia: Secondary | ICD-10-CM | POA: Diagnosis not present

## 2019-04-03 MED ORDER — LEVOTHYROXINE SODIUM 200 MCG PO TABS: 200.0000 ug | ORAL_TABLET | Freq: Every day | ORAL | 1 refills | Status: DC

## 2019-04-03 MED ORDER — LEVOTHYROXINE SODIUM 50 MCG PO TABS: 50.0000 ug | ORAL_TABLET | Freq: Every day | ORAL | 1 refills | Status: DC

## 2019-04-03 NOTE — Progress Notes (Signed)
Endocrinology follow-up note       04/03/2019, 5:32 PM   Subjective:    Patient ID: Richard Crawford, male    DOB: 1956-08-20.  [name] is being seen in follow-up for management of currently uncontrolled symptomatic diabetes requested by  Baruch Gouty, FNP.   Past Medical History:  Diagnosis Date  . Anxiety   . Arthritis    left shoulder  . Atrial fibrillation ([place])   . Cancer ([place]) 1989   thyroid  . Cellulitis   . Chronic heart failure with preserved ejection fraction ([place])   . Diabetes mellitus without complication ([place])   . Hemorrhoids    external  . Hx of colonic polyps   . Hyperlipidemia   . Hypertension   . Migraine   . Papillary thyroid carcinoma ([place])   . Postoperative hypothyroidism   . Primary osteoarthritis of left shoulder   . Sleep apnea   . Venous stasis dermatitis of both lower extremities     Past Surgical History:  Procedure Laterality Date  . ANKLE FUSION    . KNEE ARTHROSCOPY    . SHOULDER ARTHROSCOPY      Social History   Socioeconomic History  . Marital status: Married    Spouse name: Not on file  . Number of children: Not on file  . Years of education: Not on file  . Highest education level: Not on file  Occupational History  . Not on file  Social Needs  . Financial resource strain: Not on file  . Food insecurity    Worry: Not on file    Inability: Not on file  . Transportation needs    Medical: Not on file    Non-medical: Not on file  Tobacco Use  . Smoking status: Never Smoker  . Smokeless tobacco: Never Used  Substance and Sexual Activity  . Alcohol use: Not Currently  . Drug use: Never  . Sexual activity: Not on file  Lifestyle  . Physical activity    Days per week: Not on file    Minutes per session: Not on file  . Stress: Not on file  Relationships  . Social Herbalist on phone: Not on file    Gets together: Not on file     Attends religious service: Not on file    Active member of club or organization: Not on file    Attends meetings of clubs or organizations: Not on file    Relationship status: Not on file  Other Topics Concern  . Not on file  Social History Narrative  . Not on file    Family History  Problem Relation Age of Onset  . Cancer Mother        breast, stomach, ovarian  . Hypertension Mother   . Dementia Mother   . Heart disease Father     Outpatient Encounter Medications as of 04/03/2019  Medication Sig  . atorvastatin (LIPITOR) 20 MG tablet Take 1 tablet (20 mg total) by mouth daily.  . celecoxib (CELEBREX) 200 MG capsule TAKE 1 CAPSULE (200 MG TOTAL) BY MOUTH DAILY FOR 30 DAYS.  Marland Kitchen  diltiazem (CARDIZEM CD) 360 MG 24 hr capsule Take 1 capsule (360 mg total) by mouth daily.  . furosemide (LASIX) 80 MG tablet Take 1 tablet (80 mg total) by mouth daily for 30 days.  Marland Kitchen levothyroxine (SYNTHROID) 200 MCG tablet Take 1 tablet (200 mcg total) by mouth daily before breakfast.  . levothyroxine (SYNTHROID) 50 MCG tablet Take 1 tablet (50 mcg total) by mouth daily before breakfast.  . metFORMIN (GLUCOPHAGE-XR) 750 MG 24 hr tablet Take 1 tablet (750 mg total) by mouth daily with breakfast.  . Multiple Vitamin (MULTIVITAMIN) tablet Take 1 tablet by mouth 2 (two) times daily.  . rivaroxaban (XARELTO) 20 MG TABS tablet Take 1 tablet (20 mg total) by mouth daily with supper for 30 days.  Marland Kitchen spironolactone (ALDACTONE) 25 MG tablet Take 1 tablet (25 mg total) by mouth daily for 30 days.  . valsartan (DIOVAN) 80 MG tablet Take 1 tablet (80 mg total) by mouth daily for 30 days.  . [DISCONTINUED] levothyroxine (SYNTHROID, LEVOTHROID) 112 MCG tablet Take 224 mcg by mouth daily.  . [DISCONTINUED] vitamin B-12 (CYANOCOBALAMIN) 500 MCG tablet Take 500 mcg by mouth daily.   No facility-administered encounter medications on file as of 04/03/2019.     ALLERGIES: Allergies  Allergen Reactions  . Morphine And  Related Nausea And Vomiting    VACCINATION STATUS:  There is no immunization history on file for this patient.  Diabetes He presents for his follow-up diabetic visit. He has type 2 diabetes mellitus. His disease course has been stable. There are no hypoglycemic associated symptoms. Pertinent negatives for hypoglycemia include no confusion, headaches, pallor or seizures. Associated symptoms include fatigue. Pertinent negatives for diabetes include no chest pain, no polydipsia, no polyphagia, no polyuria and no weakness. There are no hypoglycemic complications. Symptoms are stable. There are no diabetic complications. Risk factors for coronary artery disease include diabetes mellitus, hypertension, male sex, obesity and sedentary lifestyle. Current diabetic treatment includes oral agent (monotherapy). His weight is fluctuating minimally. He is following a generally unhealthy diet. When asked about meal planning, he reported none. He has not had a previous visit with a dietitian (He declines referral to a dietitian.).   History of thyroid malignancy: He was diagnosed with thyroid malignancy and treated in [place] in 1989 involving surgery as well as radioactive iodine therapy from what he remembers.  The details of his diagnosis and treatment are not available to review. -His previsit Thyrogen stimulated whole-body scan is negative for tumor recurrence or distant metastasis.  He is currently on levothyroxine 224 mcg p.o. daily in the evening hours for postsurgical hypothyroidism.   -He complains of progressive weight gain, fatigue, and insists that he needs higher dose of thyroid hormone (because at one time he took up to 400 mcg and he felt better). -Patient has multiple medical problems including atrial fibrillation. -He denies dysphagia.  He has chronic sleep apnea on CPAP machine.    Review of Systems  Constitutional: Positive for fatigue. Negative for chills, fever and unexpected weight  change.  HENT: Negative for dental problem, mouth sores and trouble swallowing.   Eyes: Negative for visual disturbance.  Respiratory: Positive for shortness of breath. Negative for cough, choking, chest tightness and wheezing.   Cardiovascular: Negative for chest pain, palpitations and leg swelling.  Gastrointestinal: Negative for abdominal distention, abdominal pain, constipation, diarrhea, nausea and vomiting.  Endocrine: Negative for polydipsia, polyphagia and polyuria.  Genitourinary: Negative for dysuria, flank pain, hematuria and urgency.  Musculoskeletal: Negative for back  pain, gait problem, myalgias and neck pain.  Skin: Negative for pallor, rash and wound.  Neurological: Negative for seizures, syncope, weakness, numbness and headaches.  Psychiatric/Behavioral: Negative for confusion and dysphoric mood.    Objective:    BP 127/72 (BP Location: Right Wrist, Patient Position: Sitting, Cuff Size: Normal)   Pulse 84   Ht 6\' 3"  (1.905 m)   Wt (!) 406 lb 9.6 oz (184.4 kg)   SpO2 96%   BMI 50.82 kg/m   Wt Readings from Last 3 Encounters:  04/03/19 (!) 406 lb 9.6 oz (184.4 kg)  03/20/19 (!) 410 lb (186 kg)  03/07/19 (!) 400 lb (181.4 kg)     Physical Exam Constitutional:      General: He is not in acute distress.    Appearance: He is well-developed.  HENT:     Head: Normocephalic and atraumatic.  Neck:     Musculoskeletal: Normal range of motion and neck supple.     Thyroid: No thyromegaly.     Trachea: No tracheal deviation.  Cardiovascular:     Rate and Rhythm: Normal rate.     Pulses:          Dorsalis pedis pulses are 1+ on the right side and 1+ on the left side.       Posterior tibial pulses are 1+ on the right side and 1+ on the left side.     Heart sounds: S1 normal and S2 normal. No murmur. No gallop.   Pulmonary:     Effort: Pulmonary effort is normal. No respiratory distress.     Breath sounds: No wheezing.  Abdominal:     General: There is no distension.      Tenderness: There is no abdominal tenderness. There is no guarding.  Musculoskeletal:     Right shoulder: He exhibits no swelling and no deformity.  Skin:    General: Skin is warm and dry.     Findings: No rash.     Nails: There is no clubbing.   Neurological:     Mental Status: He is alert and oriented to person, place, and time.     Cranial Nerves: No cranial nerve deficit.     Sensory: No sensory deficit.     Gait: Gait normal.     Deep Tendon Reflexes: Reflexes are normal and symmetric.  Psychiatric:        Speech: Speech normal.        Behavior: Behavior is cooperative.     Comments: He has reluctant affect.    CMP     Component Value Date/Time   NA 141 12/15/2018 1410   K 4.5 12/15/2018 1410   CL 100 12/15/2018 1410   CO2 28 12/15/2018 1410   GLUCOSE 223 (H) 12/15/2018 1410   BUN 19 12/15/2018 1410   CREATININE 1.14 12/15/2018 1410   CALCIUM 9.6 12/15/2018 1410   PROT 7.8 12/15/2018 1410   ALBUMIN 4.6 12/15/2018 1410   AST 20 12/15/2018 1410   ALT 16 12/15/2018 1410   ALKPHOS 109 12/15/2018 1410   BILITOT 0.6 12/15/2018 1410   GFRNONAA 68 12/15/2018 1410   GFRAA 78 12/15/2018 1410     Diabetic Labs (most recent): Lab Results  Component Value Date   HGBA1C 6.9 03/20/2019   HGBA1C 6.7 12/15/2018     Lipid Panel ( most recent) Lipid Panel     Component Value Date/Time   CHOL 105 12/15/2018 1410   TRIG 174 (H) 12/15/2018 1410   HDL 35 (  L) 12/15/2018 1410   CHOLHDL 3.0 12/15/2018 1410   LDLCALC 35 12/15/2018 1410      Lab Results  Component Value Date   TSH 3.890 03/20/2019   TSH 2.95 02/28/2019   TSH 0.462 12/15/2018   FREET4 1.2 02/28/2019       Assessment & Plan:   1. Uncontrolled type 2 diabetes mellitus with hyperglycemia (HCC)  - Nicolus Ose has currently controlled asymptomatic type 2 DM since  63 years of age,  with most recent A1c of 6.9 %. Recent labs reviewed. - I had a long discussion with him about the progressive nature  of diabetes and the pathology behind its complications. -his diabetes is complicated by obesity/sedentary life and he remains at a high risk for more acute and chronic complications which include CAD, CVA, CKD, retinopathy, and neuropathy. These are all discussed in detail with him.  - I have counseled him on diet management and weight loss, by adopting a carbohydrate restricted/protein rich diet.  - he  admits there is a room for improvement in his diet and drink choices. -  Suggestion is made for him to avoid simple carbohydrates  from his diet including Cakes, Sweet Desserts / Pastries, Ice Cream, Soda (diet and regular), Sweet Tea, Candies, Chips, Cookies, Sweet Pastries,  Store Bought Juices, Alcohol in Excess of  1-2 drinks a day, Artificial Sweeteners, Coffee Creamer, and "Sugar-free" Products. This will help patient to have stable blood glucose profile and potentially avoid unintended weight gain.  - I encouraged him to switch to  unprocessed or minimally processed complex starch and increased protein intake (animal or plant source), fruits, and vegetables.  - he is advised to stick to a routine mealtimes to eat 3 meals  a day and avoid unnecessary snacks ( to snack only to correct hypoglycemia).   - he declined referral to dietitian.   - I have approached him with the following individualized plan to manage diabetes and patient agrees:   -Given his near target A1c of 6.9%, he will not need any injectable treatment at this time.  - he is advised to continue metformin 750 mg ER daily after breakfast, therapeutically suitable for patient .  - Patient specific target  A1c;  LDL, HDL, Triglycerides, and  Waist Circumference were discussed in detail.  2) Blood Pressure /Hypertension: His blood pressure is controlled to target  he is advised to continue his current medications including valsartan 80 mg p.o. daily, spironolactone 25 mg p.o. daily, metoprolol 100 mg XL p.o. daily, mg p.o. daily  with breakfast .  3) Lipids/Hyperlipidemia:   Review of his recent lipid panel showed  controlled  LDL at 35 .  he  is advised to continue    atorvastatin 20 mg daily at bedtime.  Side effects and precautions discussed with him.  4)  Weight/Diet:  Body mass index is 50.82 kg/m.  -   clearly complicating his diabetes care.  I discussed with him the fact that loss of 5 - 10% of his  current body weight will have the most impact on his diabetes management.  CDE Consult will be initiated . Exercise, and detailed carbohydrates information provided  -  detailed on discharge instructions.   5) history of thyroid cancer:   Details of his diagnosis and treatment are not available.  He was treated in [place] in 1989.  He is likely in remission. -He did not have any surveillance imaging studies in recent years.   -His  Thyrogen stimulated  whole-body scan -negative for iodine avid recurrence or distant metastasis.    He will not need any further work-up or intervention at this time .    6) postsurgical hypothyroidism: -His recent thyroid function tests involving TSH and total T4 are consistent with appropriate replacement, however, he will benefit from slight increase in his levothyroxine dose.  -I discussed and increasing levothyroxine to 250 mcg p.o. nightly.    - We discussed about the correct intake of his thyroid hormone, on empty stomach at fasting, with water, separated by at least 30 minutes from breakfast and other medications,  and separated by more than 4 hours from calcium, iron, multivitamins, acid reflux medications (PPIs). -Patient is made aware of the fact that thyroid hormone replacement is needed for life, dose to be adjusted by periodic monitoring of thyroid function tests.   7) Chronic Care/Health Maintenance:  -he  is on ACEI/ARB and Statin medications and  is encouraged to initiate and continue to follow up with Ophthalmology, Dentist,  Podiatrist at least yearly or according  to recommendations, and advised to   stay away from smoking. I have recommended yearly flu vaccine and pneumonia vaccine at least every 5 years; moderate intensity exercise for up to 150 minutes weekly; and  sleep for at least 7 hours a day.   -He was found to have supraphysiologic levels of vitamin B12.  He is advised to discontinue vitamin B12 supplements for now.   - he is  advised to maintain close follow up with Rakes, Connye Burkitt, FNP for primary care needs, as well as his other providers for optimal and coordinated care.  - Time spent with the patient: 25 min, of which >50% was spent in reviewing his blood glucose logs , discussing his hypoglycemia and hyperglycemia episodes, reviewing his current and  previous labs / studies and medications  doses and developing a plan to avoid hypoglycemia and hyperglycemia. Please refer to Patient Instructions for Blood Glucose Monitoring and Insulin/Medications Dosing Guide"  in media tab for additional information. Please  also refer to " Patient Self Inventory" in the Media  tab for reviewed elements of pertinent patient history.  [name] participated in the discussions, expressed understanding, and voiced agreement with the above plans.  All questions were answered to his satisfaction. he is encouraged to contact clinic should he have any questions or concerns prior to his return visit.   Follow up plan: - Return in about 4 months (around 08/03/2019) for Follow up with Pre-visit Labs.  Glade Lloyd, MD [facility] [address] [place], [place] [address] Phone: [phone]  Fax: [phone]    04/03/2019, 5:32 PM  This note was partially dictated with voice recognition software. Similar sounding words can be transcribed inadequately or may not  be corrected upon review.

## 2019-04-05 ENCOUNTER — Telehealth: Payer: Self-pay

## 2019-04-05 DIAGNOSIS — H543 Unqualified visual loss, both eyes: Secondary | ICD-10-CM

## 2019-04-05 NOTE — Telephone Encounter (Signed)
Referral placed.

## 2019-04-05 NOTE — Telephone Encounter (Signed)
Patient would like to be referred to an opthamologist associated with [facility]

## 2019-04-05 NOTE — Telephone Encounter (Signed)
Ok to place referral. Thanks.

## 2019-04-06 ENCOUNTER — Telehealth: Payer: Self-pay | Admitting: *Deleted

## 2019-04-06 NOTE — Telephone Encounter (Signed)
    COVID-19 Pre-Screening Questions:  . In the past 7 to 10 days have you had a cough,  shortness of breath, headache, congestion, fever (100 or greater) body aches, chills, sore throat, or sudden loss of taste or sense of smell? . Have you been around anyone with known Covid 19. . Have you been around anyone who is awaiting Covid 19 test results in the past 7 to 10 days? . Have you been around anyone who has been exposed to Covid 19, or has mentioned symptoms of Covid 19 within the past 7 to 10 days?  If you have any concerns/questions about symptoms patients report during screening (either on the phone or at threshold). Contact the provider seeing the patient or DOD for further guidance.  If neither are available contact a member of the leadership team.          Patient answered no to all questions. He knows to wear a mask, come in alone, only 15 min early and to let us know if his health changes. CP/cma

## 2019-04-10 NOTE — Progress Notes (Signed)
Cardiology Office Note   Date:  04/11/2019   ID:  Richard Crawford, DOB 22-Dec-1955, MRN [id number]  PCP:  Baruch Gouty, FNP  Cardiologist:   Minus Breeding, MD   No chief complaint on file.    History of Present Illness: Richard Crawford is a 63 y.o. male who presents for follow up of atrial fib.  He has had this for years and was treated in [place].   I was able to review some records from [facility].  He has been treated with Betapace.  It does not sound like he is had other management and he was never offered ablation.  However, he has had rate control and anticoagulation.    At my first visit with him he was very fatigued so I switched from beta blocker to Cardizem.  He returns for follow up.   He has had some significant problems with fatigue since he had a lower dose of Synthroid and he did have his dose adjusted very slightly recently.  He does not think it is enough.  Fatigue continues to be his biggest issue.  He does try to do some exercising in his pool but he gets very tired with this.  He is not describing overt shortness of breath, PND or orthopnea.  He has had no new palpitations, presyncope or syncope.  He does not really think that switching from beta-blocker to calcium channel blockers made much of a difference.  He is probably had a little bit increased lower extremity swelling recently.   Past Medical History:  Diagnosis Date  . Anxiety   . Arthritis    left shoulder  . Atrial fibrillation ([place])   . Cancer ([place]) 1989   thyroid  . Cellulitis   . Chronic heart failure with preserved ejection fraction ([place])   . Diabetes mellitus without complication ([place])   . Hemorrhoids    external  . Hx of colonic polyps   . Hyperlipidemia   . Hypertension   . Migraine   . Papillary thyroid carcinoma ([place])   . Postoperative hypothyroidism   . Primary osteoarthritis of left shoulder   . Sleep apnea   . Venous stasis dermatitis of both lower extremities     Past Surgical  History:  Procedure Laterality Date  . ANKLE FUSION    . KNEE ARTHROSCOPY    . SHOULDER ARTHROSCOPY       Current Outpatient Medications  Medication Sig Dispense Refill  . atorvastatin (LIPITOR) 20 MG tablet Take 1 tablet (20 mg total) by mouth daily. 90 tablet 1  . celecoxib (CELEBREX) 200 MG capsule TAKE 1 CAPSULE (200 MG TOTAL) BY MOUTH DAILY FOR 30 DAYS. 30 capsule 0  . diltiazem (CARDIZEM CD) 360 MG 24 hr capsule Take 1 capsule (360 mg total) by mouth daily. 90 capsule 3  . furosemide (LASIX) 80 MG tablet Take 1 tablet (80 mg total) by mouth daily for 30 days. 30 tablet 3  . levothyroxine (SYNTHROID) 200 MCG tablet Take 1 tablet (200 mcg total) by mouth daily before breakfast. 90 tablet 1  . levothyroxine (SYNTHROID) 50 MCG tablet Take 1 tablet (50 mcg total) by mouth daily before breakfast. 90 tablet 1  . metFORMIN (GLUCOPHAGE-XR) 750 MG 24 hr tablet Take 1 tablet (750 mg total) by mouth daily with breakfast. 90 tablet 0  . Multiple Vitamin (MULTIVITAMIN) tablet Take 1 tablet by mouth 2 (two) times daily.    . rivaroxaban (XARELTO) 20 MG TABS tablet Take 1 tablet (  20 mg total) by mouth daily with supper for 30 days. 30 tablet 3  . spironolactone (ALDACTONE) 25 MG tablet Take 1 tablet (25 mg total) by mouth daily for 30 days. 30 tablet 3  . valsartan (DIOVAN) 80 MG tablet Take 1 tablet (80 mg total) by mouth daily for 30 days. 30 tablet 3   No current facility-administered medications for this visit.     Allergies:   Morphine and related    ROS:  Please see the history of present illness.   Otherwise, review of systems are positive for none.   All other systems are reviewed and negative.    PHYSICAL EXAM: VS:  BP 138/72   Pulse 80   Ht 6\' 3"  (1.905 m)   Wt (!) 405 lb (183.7 kg)   BMI 50.62 kg/m  , BMI Body mass index is 50.62 kg/m. GENERAL:  Well appearing NECK:  No jugular venous distention, waveform within normal limits, carotid upstroke brisk and symmetric, no bruits,  no thyromegaly LUNGS:  Clear to auscultation bilaterally BACK:  No CVA tenderness CHEST:  Unremarkable HEART:  PMI not displaced or sustained,S1 and S2 within normal limits,  no S3, no clicks, no rubs, no murmurs, irregular ABD:  Flat, positive bowel sounds normal in frequency in pitch, no bruits, no rebound, no guarding, no midline pulsatile mass, no hepatomegaly, no splenomegaly EXT:  2 plus pulses throughout, no edema, no cyanosis no clubbing   EKG:  EKG is ordered today. The ekg ordered today demonstrates atrial fibrillation, rate 80, axis within normal limits, QTC prolonged, low voltage.   Recent Labs: 12/15/2018: ALT 16; BUN 19; Creatinine, Ser 1.14; Hemoglobin 14.6; Platelets 241; Potassium 4.5; Sodium 141 03/20/2019: TSH 3.890    Lipid Panel    Component Value Date/Time   CHOL 105 12/15/2018 1410   TRIG 174 (H) 12/15/2018 1410   HDL 35 (L) 12/15/2018 1410   CHOLHDL 3.0 12/15/2018 1410   LDLCALC 35 12/15/2018 1410      Wt Readings from Last 3 Encounters:  04/11/19 (!) 405 lb (183.7 kg)  04/03/19 (!) 406 lb 9.6 oz (184.4 kg)  03/20/19 (!) 410 lb (186 kg)      Other studies Reviewed: Additional studies/ records that were reviewed today include: None. Review of the above records demonstrates:  Please see elsewhere in the note.     ASSESSMENT AND PLAN:  ATRIAL FIB:   He is in chronic atrial fibrillation.    I will leave him on a calcium channel blocker of the but did not unfortunately improve his fatigue is much as I would help.  He is tolerating anticoagulation.  He did get a FitBit and he reports that his heart rate is well controlled.   HTN:  The blood pressure is controlled.    DYSLIPIDEMIA:   Lipids were excellent.  No change in therapy.   SLEEP APNEA:    He reports that this is well managed.  No change in therapy.  CHRONIC DIASTOLIC HF:     He has a little bit of lower extremity swelling.  This could possibly be related to calcium channel blocker.  I  suggested an increased dose of diuretic for a few days to take an extra 40 mg.   FATIGUE: I suggested he have his testosterone checked.  Spironolactone could contribute to some symptoms but he is on this for years without change in his fatigue really predated this.  At this point I would not change therapy.  Current medicines  are reviewed at length with the patient today.  The patient does not have concerns regarding medicines.  The following changes have been made:  no change  Labs/ tests ordered today include: None No orders of the defined types were placed in this encounter.    Disposition:   FU with me in one year.     Signed, Minus Breeding, MD  04/11/2019 2:10 PM    [facility]

## 2019-04-11 ENCOUNTER — Other Ambulatory Visit: Payer: Self-pay

## 2019-04-11 ENCOUNTER — Ambulatory Visit (INDEPENDENT_AMBULATORY_CARE_PROVIDER_SITE_OTHER): Payer: BC Managed Care – PPO | Admitting: Cardiology

## 2019-04-11 ENCOUNTER — Encounter: Payer: Self-pay | Admitting: Cardiology

## 2019-04-11 VITALS — BP 138/72 | HR 80 | Ht 75.0 in | Wt >= 6400 oz

## 2019-04-11 DIAGNOSIS — I5032 Chronic diastolic (congestive) heart failure: Secondary | ICD-10-CM

## 2019-04-11 DIAGNOSIS — I4821 Permanent atrial fibrillation: Secondary | ICD-10-CM | POA: Diagnosis not present

## 2019-04-11 NOTE — Patient Instructions (Signed)
Medication Instructions:  The current medical regimen is effective;  continue present plan and medications.  If you need a refill on your cardiac medications before your next appointment, please call your pharmacy.   Follow-Up: Follow up in 1 year with Dr. Hochrein.  You will receive a letter in the mail 2 months before you are due.  Please call us when you receive this letter to schedule your follow up appointment.  Thank you for choosing [facility]!!     

## 2019-04-17 ENCOUNTER — Other Ambulatory Visit: Payer: Self-pay

## 2019-04-17 ENCOUNTER — Ambulatory Visit (INDEPENDENT_AMBULATORY_CARE_PROVIDER_SITE_OTHER): Payer: BC Managed Care – PPO | Admitting: Family Medicine

## 2019-04-17 DIAGNOSIS — L03115 Cellulitis of right lower limb: Secondary | ICD-10-CM | POA: Diagnosis not present

## 2019-04-17 MED ORDER — DOXYCYCLINE HYCLATE 100 MG PO TABS: 100.0000 mg | ORAL_TABLET | Freq: Two times a day (BID) | ORAL | 0 refills | Status: DC

## 2019-04-17 NOTE — Progress Notes (Signed)
Telephone visit  Subjective: CC: spider bite? PCP: Baruch Gouty, FNP EXB:[id number] Richard Crawford is a 63 y.o. male calls for telephone consult today. Patient provides verbal consent for consult held via phone.  Location of patient: home Location of provider: [facility] Others present for call: none  1.  Red calf Patient reports elevated HR (highest 130), fever (highest 100.29F which resolved with Tylenol), chest pain over last few days.  He reports that he thinks he may have been bitten on right calf.  He reports right calf is beet red.  He notes that his current presentation is very similar to when he had cellulitis of his leg in the past.  He did require hospitalization for that when because his heart rate did not come down and his fevers reached above 104F.  He has not had any specific drainage from the leg.  He reports compliance with his anticoagulant, Xarelto which he takes for atrial fibrillation.  His heart rate is back within normal range now as this is temperature.  ROS: Per HPI  Allergies  Allergen Reactions  . Morphine And Related Nausea And Vomiting   Past Medical History:  Diagnosis Date  . Anxiety   . Arthritis    left shoulder  . Atrial fibrillation ([place])   . Cancer ([place]) 1989   thyroid  . Cellulitis   . Chronic heart failure with preserved ejection fraction ([place])   . Diabetes mellitus without complication ([place])   . Hemorrhoids    external  . Hx of colonic polyps   . Hyperlipidemia   . Hypertension   . Migraine   . Papillary thyroid carcinoma ([place])   . Postoperative hypothyroidism   . Primary osteoarthritis of left shoulder   . Sleep apnea   . Venous stasis dermatitis of both lower extremities     Current Outpatient Medications:  .  atorvastatin (LIPITOR) 20 MG tablet, Take 1 tablet (20 mg total) by mouth daily., Disp: 90 tablet, Rfl: 1 .  celecoxib (CELEBREX) 200 MG capsule, TAKE 1 CAPSULE (200 MG TOTAL) BY MOUTH DAILY FOR 30 DAYS., Disp: 30 capsule, Rfl: 0 .   diltiazem (CARDIZEM CD) 360 MG 24 hr capsule, Take 1 capsule (360 mg total) by mouth daily., Disp: 90 capsule, Rfl: 3 .  furosemide (LASIX) 80 MG tablet, Take 1 tablet (80 mg total) by mouth daily for 30 days., Disp: 30 tablet, Rfl: 3 .  levothyroxine (SYNTHROID) 200 MCG tablet, Take 1 tablet (200 mcg total) by mouth daily before breakfast., Disp: 90 tablet, Rfl: 1 .  levothyroxine (SYNTHROID) 50 MCG tablet, Take 1 tablet (50 mcg total) by mouth daily before breakfast., Disp: 90 tablet, Rfl: 1 .  metFORMIN (GLUCOPHAGE-XR) 750 MG 24 hr tablet, Take 1 tablet (750 mg total) by mouth daily with breakfast., Disp: 90 tablet, Rfl: 0 .  Multiple Vitamin (MULTIVITAMIN) tablet, Take 1 tablet by mouth 2 (two) times daily., Disp: , Rfl:  .  rivaroxaban (XARELTO) 20 MG TABS tablet, Take 1 tablet (20 mg total) by mouth daily with supper for 30 days., Disp: 30 tablet, Rfl: 3 .  spironolactone (ALDACTONE) 25 MG tablet, Take 1 tablet (25 mg total) by mouth daily for 30 days., Disp: 30 tablet, Rfl: 3 .  valsartan (DIOVAN) 80 MG tablet, Take 1 tablet (80 mg total) by mouth daily for 30 days., Disp: 30 tablet, Rfl: 3  Assessment/ Plan: 63 y.o. male   1. Cellulitis of right lower extremity Presumed cellulitis of the right lower extremity.  Possibly due  to tick bite versus spider bite.  Will empirically treat with doxycycline 1 mg p.o. twice daily.  We discussed reasons for emergent evaluation emergency department.  Differential diagnosis considered includes DVT but patient is on chronic anticoagulation so this is less likely.  We did discuss red flag signs and symptoms.  He voiced good understanding will follow-up PRN.  He will contact me if he has no improvement or worsening within the next 24 to 48 hours.  At which point, could consider addition of Keflex to broaden coverage. - doxycycline (VIBRA-TABS) 100 MG tablet; Take 1 tablet (100 mg total) by mouth 2 (two) times daily.  Dispense: 20 tablet; Refill: 0   Start  time: 1:04pm End time: 1:09pm  Total time spent on patient care (including telephone call/ virtual visit): 15 minutes  [place], [place] [phone]

## 2019-04-17 NOTE — Patient Instructions (Signed)

## 2019-04-20 ENCOUNTER — Telehealth: Payer: Self-pay | Admitting: Family Medicine

## 2019-04-29 ENCOUNTER — Other Ambulatory Visit: Payer: Self-pay | Admitting: Family Medicine

## 2019-05-10 DIAGNOSIS — Z8669 Personal history of other diseases of the nervous system and sense organs: Secondary | ICD-10-CM | POA: Diagnosis not present

## 2019-05-10 DIAGNOSIS — H538 Other visual disturbances: Secondary | ICD-10-CM | POA: Diagnosis not present

## 2019-05-10 DIAGNOSIS — H04123 Dry eye syndrome of bilateral lacrimal glands: Secondary | ICD-10-CM | POA: Diagnosis not present

## 2019-05-10 DIAGNOSIS — I10 Essential (primary) hypertension: Secondary | ICD-10-CM | POA: Diagnosis not present

## 2019-05-10 DIAGNOSIS — H2513 Age-related nuclear cataract, bilateral: Secondary | ICD-10-CM | POA: Diagnosis not present

## 2019-05-10 DIAGNOSIS — H35371 Puckering of macula, right eye: Secondary | ICD-10-CM | POA: Diagnosis not present

## 2019-05-10 DIAGNOSIS — R7303 Prediabetes: Secondary | ICD-10-CM | POA: Diagnosis not present

## 2019-05-10 DIAGNOSIS — B0239 Other herpes zoster eye disease: Secondary | ICD-10-CM | POA: Diagnosis not present

## 2019-05-10 DIAGNOSIS — H43813 Vitreous degeneration, bilateral: Secondary | ICD-10-CM | POA: Diagnosis not present

## 2019-05-10 DIAGNOSIS — H35033 Hypertensive retinopathy, bilateral: Secondary | ICD-10-CM | POA: Diagnosis not present

## 2019-05-30 ENCOUNTER — Other Ambulatory Visit: Payer: Self-pay

## 2019-05-30 MED ORDER — LEVOTHYROXINE SODIUM 50 MCG PO TABS: 50.0000 ug | ORAL_TABLET | Freq: Every day | ORAL | 1 refills | Status: DC

## 2019-05-30 MED ORDER — LEVOTHYROXINE SODIUM 200 MCG PO TABS: 200.0000 ug | ORAL_TABLET | Freq: Every day | ORAL | 1 refills | Status: DC

## 2019-06-05 ENCOUNTER — Other Ambulatory Visit: Payer: Self-pay | Admitting: Family Medicine

## 2019-06-13 ENCOUNTER — Other Ambulatory Visit: Payer: Self-pay

## 2019-06-13 ENCOUNTER — Other Ambulatory Visit: Payer: Self-pay | Admitting: Family Medicine

## 2019-06-13 DIAGNOSIS — E1159 Type 2 diabetes mellitus with other circulatory complications: Secondary | ICD-10-CM

## 2019-06-13 DIAGNOSIS — I152 Hypertension secondary to endocrine disorders: Secondary | ICD-10-CM

## 2019-06-14 ENCOUNTER — Encounter: Payer: Self-pay | Admitting: Family Medicine

## 2019-06-14 ENCOUNTER — Ambulatory Visit: Payer: BC Managed Care – PPO | Admitting: Family Medicine

## 2019-06-14 DIAGNOSIS — H9191 Unspecified hearing loss, right ear: Secondary | ICD-10-CM

## 2019-06-14 DIAGNOSIS — H6123 Impacted cerumen, bilateral: Secondary | ICD-10-CM | POA: Diagnosis not present

## 2019-06-14 NOTE — Patient Instructions (Signed)
Earwax Buildup, Adult The ears produce a substance called earwax that helps keep bacteria out of the ear and protects the skin in the ear canal. Occasionally, earwax can build up in the ear and cause discomfort or hearing loss. What increases the risk? This condition is more likely to develop in people who:  Are male.  Are elderly.  Naturally produce more earwax.  Clean their ears often with cotton swabs.  Use earplugs often.  Use in-ear headphones often.  Wear hearing aids.  Have narrow ear canals.  Have earwax that is overly thick or sticky.  Have eczema.  Are dehydrated.  Have excess hair in the ear canal. What are the signs or symptoms? Symptoms of this condition include:  Reduced or muffled hearing.  A feeling of fullness in the ear or feeling that the ear is plugged.  Fluid coming from the ear.  Ear pain.  Ear itch.  Ringing in the ear.  Coughing.  An obvious piece of earwax that can be seen inside the ear canal. How is this diagnosed? This condition may be diagnosed based on:  Your symptoms.  Your medical history.  An ear exam. During the exam, your health care provider will look into your ear with an instrument called an otoscope. You may have tests, including a hearing test. How is this treated? This condition may be treated by:  Using ear drops to soften the earwax.  Having the earwax removed by a health care provider. The health care provider may: ? Flush the ear with water. ? Use an instrument that has a loop on the end (curette). ? Use a suction device.  Surgery to remove the wax buildup. This may be done in severe cases. Follow these instructions at home:   Take over-the-counter and prescription medicines only as told by your health care provider.  Do not put any objects, including cotton swabs, into your ear. You can clean the opening of your ear canal with a washcloth or facial tissue.  Follow instructions from your health care  provider about cleaning your ears. Do not over-clean your ears.  Drink enough fluid to keep your urine clear or pale yellow. This will help to thin the earwax.  Keep all follow-up visits as told by your health care provider. If earwax builds up in your ears often or if you use hearing aids, consider seeing your health care provider for routine, preventive ear cleanings. Ask your health care provider how often you should schedule your cleanings.  If you have hearing aids, clean them according to instructions from the manufacturer and your health care provider. Contact a health care provider if:  You have ear pain.  You develop a fever.  You have blood, pus, or other fluid coming from your ear.  You have hearing loss.  You have ringing in your ears that does not go away.  Your symptoms do not improve with treatment.  You feel like the room is spinning (vertigo). Summary  Earwax can build up in the ear and cause discomfort or hearing loss.  The most common symptoms of this condition include reduced or muffled hearing and a feeling of fullness in the ear or feeling that the ear is plugged.  This condition may be diagnosed based on your symptoms, your medical history, and an ear exam.  This condition may be treated by using ear drops to soften the earwax or by having the earwax removed by a health care provider.  Do not put any   objects, including cotton swabs, into your ear. You can clean the opening of your ear canal with a washcloth or facial tissue. This information is not intended to replace advice given to you by your health care provider. Make sure you discuss any questions you have with your health care provider. Document Released: 11/04/2004 Document Revised: 09/09/2017 Document Reviewed: 12/08/2016 Elsevier Patient Education  2020 [employer] Inc[employer]  

## 2019-06-14 NOTE — Progress Notes (Signed)
Subjective:  Patient ID: Richard Crawford, male    DOB: [birth year], 63 y.o.   MRN: [id number]  Patient Care Team: Baruch Gouty, FNP as PCP - General (Family Medicine) Minus Breeding, MD as PCP - Cardiology (Cardiology)   Chief Complaint:  ear stopped up   HPI: Richard Crawford is a 63 y.o. male presenting on 06/14/2019 for ear stopped up   Pt presents today with complaints of decreased hearing and cerumen impaction in right ear. He has tried debrox and flushing ear without success. No pain, fever, chills, dizziness, drainage, or headache.   Ear Fullness  There is pain in the right ear. This is a new problem. The current episode started in the past 7 days. The problem occurs constantly. The problem has been unchanged. There has been no fever. The patient is experiencing no pain. Associated symptoms include hearing loss. Pertinent negatives include no abdominal pain, coughing, diarrhea, ear discharge, headaches, neck pain, rash, rhinorrhea, sore throat or vomiting. He has tried ear drops (flushing ear) for the symptoms. The treatment provided no relief.     Relevant past medical, surgical, family, and social history reviewed and updated as indicated.  Allergies and medications reviewed and updated. Date reviewed: Chart in Epic.   Past Medical History:  Diagnosis Date  . Anxiety   . Arthritis    left shoulder  . Atrial fibrillation ([place])   . Cancer ([place]) 1989   thyroid  . Cellulitis   . Chronic heart failure with preserved ejection fraction ([place])   . Diabetes mellitus without complication ([place])   . Hemorrhoids    external  . Hx of colonic polyps   . Hyperlipidemia   . Hypertension   . Migraine   . Papillary thyroid carcinoma ([place])   . Postoperative hypothyroidism   . Primary osteoarthritis of left shoulder   . Sleep apnea   . Venous stasis dermatitis of both lower extremities     Past Surgical History:  Procedure Laterality Date  . ANKLE FUSION    . KNEE ARTHROSCOPY     . SHOULDER ARTHROSCOPY      Social History   Socioeconomic History  . Marital status: Married    Spouse name: Not on file  . Number of children: Not on file  . Years of education: Not on file  . Highest education level: Not on file  Occupational History  . Not on file  Social Needs  . Financial resource strain: Not on file  . Food insecurity    Worry: Not on file    Inability: Not on file  . Transportation needs    Medical: Not on file    Non-medical: Not on file  Tobacco Use  . Smoking status: Never Smoker  . Smokeless tobacco: Never Used  Substance and Sexual Activity  . Alcohol use: Not Currently  . Drug use: Never  . Sexual activity: Not on file  Lifestyle  . Physical activity    Days per week: Not on file    Minutes per session: Not on file  . Stress: Not on file  Relationships  . Social Herbalist on phone: Not on file    Gets together: Not on file    Attends religious service: Not on file    Active member of club or organization: Not on file    Attends meetings of clubs or organizations: Not on file    Relationship status: Not on file  . Intimate partner violence  Fear of current or ex partner: Not on file    Emotionally abused: Not on file    Physically abused: Not on file    Forced sexual activity: Not on file  Other Topics Concern  . Not on file  Social History Narrative  . Not on file    Outpatient Encounter Medications as of 06/14/2019  Medication Sig  . atorvastatin (LIPITOR) 20 MG tablet Take 1 tablet (20 mg total) by mouth daily.  Marland Kitchen diltiazem (CARDIZEM CD) 360 MG 24 hr capsule Take 1 capsule (360 mg total) by mouth daily.  Marland Kitchen doxycycline (VIBRA-TABS) 100 MG tablet Take 1 tablet (100 mg total) by mouth 2 (two) times daily.  Marland Kitchen levothyroxine (SYNTHROID) 200 MCG tablet Take 1 tablet (200 mcg total) by mouth daily before breakfast.  . levothyroxine (SYNTHROID) 50 MCG tablet Take 1 tablet (50 mcg total) by mouth daily before breakfast.   . metFORMIN (GLUCOPHAGE-XR) 750 MG 24 hr tablet TAKE 1 TABLET BY MOUTH EVERY DAY WITH BREAKFAST  . Multiple Vitamin (MULTIVITAMIN) tablet Take 1 tablet by mouth 2 (two) times daily.  . valsartan (DIOVAN) 80 MG tablet Take 1 tablet (80 mg total) by mouth daily.  Alveda Reasons 20 MG TABS tablet TAKE 1 TABLET (20 MG TOTAL) BY MOUTH DAILY WITH SUPPER FOR 30 DAYS.  . furosemide (LASIX) 80 MG tablet Take 1 tablet (80 mg total) by mouth daily for 30 days.  Marland Kitchen spironolactone (ALDACTONE) 25 MG tablet Take 1 tablet (25 mg total) by mouth daily for 30 days.   No facility-administered encounter medications on file as of 06/14/2019.     Allergies  Allergen Reactions  . Morphine And Related Nausea And Vomiting    Review of Systems  Constitutional: Negative for activity change, appetite change, chills, diaphoresis, fatigue, fever and unexpected weight change.  HENT: Positive for hearing loss. Negative for congestion, ear discharge, ear pain, postnasal drip, rhinorrhea, sore throat and tinnitus.        Ear fullness  Eyes: Negative for photophobia and visual disturbance.  Respiratory: Negative for cough and shortness of breath.   Cardiovascular: Negative for chest pain and palpitations.  Gastrointestinal: Negative for abdominal pain, diarrhea and vomiting.  Musculoskeletal: Negative for neck pain.  Skin: Negative for rash.  Neurological: Negative for dizziness, weakness, light-headedness and headaches.  Psychiatric/Behavioral: Negative for confusion.  All other systems reviewed and are negative.       Objective:  BP 126/72   Pulse 79   Temp 97.7 F (36.5 C)   Ht 6\' 3"  (1.905 m)   Wt (!) 409 lb (185.5 kg)   BMI 51.12 kg/m    Wt Readings from Last 3 Encounters:  06/14/19 (!) 409 lb (185.5 kg)  04/11/19 (!) 405 lb (183.7 kg)  04/03/19 (!) 406 lb 9.6 oz (184.4 kg)    Physical Exam Vitals signs and nursing note reviewed.  Constitutional:      General: He is not in acute distress.     Appearance: Normal appearance. He is well-developed and well-groomed. He is not ill-appearing, toxic-appearing or diaphoretic.  HENT:     Head: Normocephalic and atraumatic.     Jaw: There is normal jaw occlusion.     Right Ear: Decreased hearing noted. There is impacted cerumen.     Left Ear: Hearing normal. No decreased hearing noted. There is impacted cerumen.     Ears:     Weber exam findings: lateralizes right.    Right Rinne: BC > AC.  Left Rinne: AC > BC.    Nose: Nose normal.     Mouth/Throat:     Lips: Pink.     Mouth: Mucous membranes are moist.     Pharynx: Oropharynx is clear. Uvula midline.  Eyes:     General: Lids are normal.     Extraocular Movements: Extraocular movements intact.     Conjunctiva/sclera: Conjunctivae normal.     Pupils: Pupils are equal, round, and reactive to light.  Neck:     Musculoskeletal: Normal range of motion and neck supple.     Thyroid: No thyroid mass, thyromegaly or thyroid tenderness.     Vascular: No carotid bruit or JVD.     Trachea: Trachea and phonation normal.  Cardiovascular:     Rate and Rhythm: Normal rate and regular rhythm.     Chest Wall: PMI is not displaced.     Pulses: Normal pulses.     Heart sounds: Normal heart sounds. No murmur. No friction rub. No gallop.   Pulmonary:     Effort: Pulmonary effort is normal. No respiratory distress.     Breath sounds: Normal breath sounds. No wheezing.  Abdominal:     General: Bowel sounds are normal. There is no distension or abdominal bruit.     Palpations: Abdomen is soft. There is no hepatomegaly or splenomegaly.     Tenderness: There is no abdominal tenderness. There is no right CVA tenderness or left CVA tenderness.     Hernia: No hernia is present.  Musculoskeletal: Normal range of motion.     Right lower leg: No edema.     Left lower leg: No edema.  Lymphadenopathy:     Cervical: No cervical adenopathy.  Skin:    General: Skin is warm and dry.     Capillary Refill:  Capillary refill takes less than 2 seconds.     Coloration: Skin is not cyanotic, jaundiced or pale.     Findings: No rash.  Neurological:     General: No focal deficit present.     Mental Status: He is alert and oriented to person, place, and time.     Cranial Nerves: Cranial nerves are intact.     Sensory: Sensation is intact.     Motor: Motor function is intact.     Coordination: Coordination is intact.     Gait: Gait is intact.     Deep Tendon Reflexes: Reflexes are normal and symmetric.  Psychiatric:        Attention and Perception: Attention and perception normal.        Mood and Affect: Mood and affect normal.        Speech: Speech normal.        Behavior: Behavior normal. Behavior is cooperative.        Thought Content: Thought content normal.        Cognition and Memory: Cognition and memory normal.        Judgment: Judgment normal.      Results for orders placed or performed during the hospital encounter of 03/23/19  Thyroglobulin  Result Value Ref Range   Thyroglobulin <2.0 ng/mL  Thyroglobulin antibody  Result Value Ref Range   Thyroglobulin Antibody <1.0 0.0 - 0.9 IU/mL     Ear Cerumen Removal  Date/Time: 06/14/2019 10:08 AM Performed by: Zannie Cove, LPN Authorized by: Baruch Gouty, FNP   Anesthesia: Local Anesthetic: none Location details: left ear and right ear Patient tolerance: patient tolerated the procedure well with no  immediate complications Comments: Bilateral TM pearly gray without erythema or bulging post irrigation. Hearing returned to normal post irrigation.  Procedure type: irrigation  Sedation: Patient sedated: no      Pertinent labs & imaging results that were available during my care of the patient were reviewed by me and considered in my medical decision making.  Assessment & Plan:  Jaquari was seen today for ear stopped up.  Diagnoses and all orders for this visit:  Decreased hearing of right ear Hearing returned to  normal post cerumen removal.   Bilateral impacted cerumen Moderate amount of was removed from both ears. TM and hearing normal post cerumen removal.  -     Remove impacted ear wax     Continue all other maintenance medications.  Follow up plan: Return if symptoms worsen or fail to improve.  Continue healthy lifestyle choices, including diet (rich in fruits, vegetables, and lean proteins, and low in salt and simple carbohydrates) and exercise (at least 30 minutes of moderate physical activity daily).  Educational handout given for earwax buildup   The above assessment and management plan was discussed with the patient. The patient verbalized understanding of and has agreed to the management plan. Patient is aware to call the clinic if symptoms persist or worsen. Patient is aware when to return to the clinic for a follow-up visit. Patient educated on when it is appropriate to go to the emergency department.   Monia Pouch, FNP-C [facility] [phone]

## 2019-06-24 ENCOUNTER — Other Ambulatory Visit: Payer: Self-pay | Admitting: Family Medicine

## 2019-06-25 ENCOUNTER — Encounter: Payer: Self-pay | Admitting: Family Medicine

## 2019-06-25 ENCOUNTER — Ambulatory Visit (INDEPENDENT_AMBULATORY_CARE_PROVIDER_SITE_OTHER): Payer: BC Managed Care – PPO | Admitting: Family Medicine

## 2019-06-25 ENCOUNTER — Other Ambulatory Visit: Payer: Self-pay

## 2019-06-25 DIAGNOSIS — Z6841 Body Mass Index (BMI) 40.0 and over, adult: Secondary | ICD-10-CM

## 2019-06-25 DIAGNOSIS — I10 Essential (primary) hypertension: Secondary | ICD-10-CM

## 2019-06-25 DIAGNOSIS — E1169 Type 2 diabetes mellitus with other specified complication: Secondary | ICD-10-CM

## 2019-06-25 DIAGNOSIS — E1159 Type 2 diabetes mellitus with other circulatory complications: Secondary | ICD-10-CM | POA: Diagnosis not present

## 2019-06-25 DIAGNOSIS — R6882 Decreased libido: Secondary | ICD-10-CM

## 2019-06-25 DIAGNOSIS — E119 Type 2 diabetes mellitus without complications: Secondary | ICD-10-CM

## 2019-06-25 DIAGNOSIS — E890 Postprocedural hypothyroidism: Secondary | ICD-10-CM

## 2019-06-25 DIAGNOSIS — R5383 Other fatigue: Secondary | ICD-10-CM

## 2019-06-25 DIAGNOSIS — I5032 Chronic diastolic (congestive) heart failure: Secondary | ICD-10-CM

## 2019-06-25 DIAGNOSIS — I152 Hypertension secondary to endocrine disorders: Secondary | ICD-10-CM

## 2019-06-25 DIAGNOSIS — E785 Hyperlipidemia, unspecified: Secondary | ICD-10-CM

## 2019-06-25 DIAGNOSIS — E1141 Type 2 diabetes mellitus with diabetic mononeuropathy: Secondary | ICD-10-CM

## 2019-06-25 MED ORDER — GABAPENTIN 100 MG PO CAPS: 100.0000 mg | ORAL_CAPSULE | Freq: Three times a day (TID) | ORAL | 3 refills | Status: DC

## 2019-06-25 NOTE — Progress Notes (Signed)
Virtual Visit via telephone Note Due to COVID-19 pandemic this visit was conducted virtually. This visit type was conducted due to national recommendations for restrictions regarding the COVID-19 Pandemic (e.g. social distancing, sheltering in place) in an effort to limit this patient's exposure and mitigate transmission in our community. All issues noted in this document were discussed and addressed.  A physical exam was not performed with this format.   I connected with Richard Crawford on 06/25/19 at 1450 by telephone and verified that I am speaking with the correct person using two identifiers. Richard Crawford is currently located at home and family is currently with them during visit. The provider, Monia Pouch, FNP is located in their office at time of visit.  I discussed the limitations, risks, security and privacy concerns of performing an evaluation and management service by telephone and the availability of in person appointments. I also discussed with the patient that there may be a patient responsible charge related to this service. The patient expressed understanding and agreed to proceed.  Subjective:  Patient ID: Richard Crawford, male    DOB: [birth year], 63 y.o.   MRN: [id number]  Chief Complaint:  Medical Management of Chronic Issues, Hypertension, Diabetes, and Atrial Fibrillation   HPI: Richard Crawford is a 63 y.o. male presenting on 06/25/2019 for Medical Management of Chronic Issues, Hypertension, Diabetes, and Atrial Fibrillation   Pt following up today for management of chronic medical conditions. Pt states he continues to have fatigue. States this is daily and worsening. He states he was on testosterone replacement in the past and is wondering if this is the cause of his fatigue. Pt states he does have a decreased libido. States he has no interest in sex at this point. States he feels down due to his fatigue and decreased libido. Has not had testosterone tested in a while. Pt  reports he also has stinging sensations in his legs. States this starts below his knees and goes down to his toes. States this has been ongoing for several months. States this is worse when he is on his feet for several hours. States the pain can be an 8/10 at times. No loss of function or weakness, just tingling in his lower legs. No injury.   Hypertension This is a chronic problem. The current episode started more than 1 year ago. The problem has been waxing and waning since onset. The problem is controlled. Associated symptoms include malaise/fatigue, peripheral edema and shortness of breath. Pertinent negatives include no anxiety, blurred vision, chest pain, headaches, neck pain, orthopnea, palpitations, PND or sweats. Agents associated with hypertension include thyroid hormones. Risk factors for coronary artery disease include diabetes mellitus, dyslipidemia, family history, obesity, male gender and sedentary lifestyle. Past treatments include angiotensin blockers and diuretics. The current treatment provides significant improvement. Compliance problems include exercise and diet.  Hypertensive end-organ damage includes heart failure and PVD. There is no history of CVA.  Diabetes He presents for his follow-up diabetic visit. He has type 2 diabetes mellitus. His disease course has been stable. There are no hypoglycemic associated symptoms. Pertinent negatives for hypoglycemia include no confusion, dizziness, headaches, hunger, mood changes, nervousness/anxiousness, pallor, seizures, sleepiness, speech difficulty, sweats or tremors. Associated symptoms include fatigue and foot paresthesias. Pertinent negatives for diabetes include no blurred vision, no chest pain, no foot ulcerations, no polydipsia, no polyphagia, no polyuria, no visual change, no weakness and no weight loss. Pertinent negatives for hypoglycemia complications include no blackouts, no hospitalization, no nocturnal hypoglycemia, no required  assistance and no required glucagon injection. Symptoms are stable. Diabetic complications include heart disease, peripheral neuropathy and PVD. Pertinent negatives for diabetic complications include no autonomic neuropathy or CVA. Risk factors for coronary artery disease include dyslipidemia, diabetes mellitus, hypertension, male sex, obesity and sedentary lifestyle. Current diabetic treatment includes oral agent (monotherapy). He is compliant with treatment all of the time. His weight is stable. He is following a generally unhealthy diet. When asked about meal planning, he reported none. He has not had a previous visit with a dietitian. He rarely participates in exercise. There is no change in his home blood glucose trend. An ACE inhibitor/angiotensin II receptor blocker is being taken. He sees a podiatrist.Eye exam is current.  Atrial Fibrillation Presents for follow-up visit. Symptoms include hypertension and shortness of breath. Symptoms are negative for bradycardia, chest pain, dizziness, hemodynamic instability, hypotension, palpitations, syncope, tachycardia and weakness. The symptoms have been stable. Past medical history includes atrial fibrillation. There are no medication compliance problems.     Relevant past medical, surgical, family, and social history reviewed and updated as indicated.  Allergies and medications reviewed and updated.   Past Medical History:  Diagnosis Date  . Anxiety   . Arthritis    left shoulder  . Atrial fibrillation ([place])   . Cancer ([place]) 1989   thyroid  . Cellulitis   . Chronic heart failure with preserved ejection fraction ([place])   . Diabetes mellitus without complication ([place])   . Hemorrhoids    external  . Hx of colonic polyps   . Hyperlipidemia   . Hypertension   . Migraine   . Papillary thyroid carcinoma ([place])   . Postoperative hypothyroidism   . Primary osteoarthritis of left shoulder   . Sleep apnea   . Venous stasis dermatitis of both lower  extremities     Past Surgical History:  Procedure Laterality Date  . ANKLE FUSION    . KNEE ARTHROSCOPY    . SHOULDER ARTHROSCOPY      Social History   Socioeconomic History  . Marital status: Married    Spouse name: Not on file  . Number of children: Not on file  . Years of education: Not on file  . Highest education level: Not on file  Occupational History  . Not on file  Social Needs  . Financial resource strain: Not on file  . Food insecurity    Worry: Not on file    Inability: Not on file  . Transportation needs    Medical: Not on file    Non-medical: Not on file  Tobacco Use  . Smoking status: Never Smoker  . Smokeless tobacco: Never Used  Substance and Sexual Activity  . Alcohol use: Not Currently  . Drug use: Never  . Sexual activity: Not on file  Lifestyle  . Physical activity    Days per week: Not on file    Minutes per session: Not on file  . Stress: Not on file  Relationships  . Social Herbalist on phone: Not on file    Gets together: Not on file    Attends religious service: Not on file    Active member of club or organization: Not on file    Attends meetings of clubs or organizations: Not on file    Relationship status: Not on file  . Intimate partner violence    Fear of current or ex partner: Not on file    Emotionally abused: Not on file  Physically abused: Not on file    Forced sexual activity: Not on file  Other Topics Concern  . Not on file  Social History Narrative  . Not on file    Outpatient Encounter Medications as of 06/25/2019  Medication Sig  . atorvastatin (LIPITOR) 20 MG tablet Take 1 tablet (20 mg total) by mouth daily.  Marland Kitchen diltiazem (CARDIZEM CD) 360 MG 24 hr capsule Take 1 capsule (360 mg total) by mouth daily.  . furosemide (LASIX) 80 MG tablet Take 1 tablet (80 mg total) by mouth daily for 30 days.  Marland Kitchen gabapentin (NEURONTIN) 100 MG capsule Take 1 capsule (100 mg total) by mouth 3 (three) times daily.  Marland Kitchen  levothyroxine (SYNTHROID) 200 MCG tablet Take 1 tablet (200 mcg total) by mouth daily before breakfast.  . levothyroxine (SYNTHROID) 50 MCG tablet Take 1 tablet (50 mcg total) by mouth daily before breakfast.  . metFORMIN (GLUCOPHAGE-XR) 750 MG 24 hr tablet TAKE 1 TABLET BY MOUTH EVERY DAY WITH BREAKFAST  . Multiple Vitamin (MULTIVITAMIN) tablet Take 1 tablet by mouth 2 (two) times daily.  Marland Kitchen spironolactone (ALDACTONE) 25 MG tablet Take 1 tablet (25 mg total) by mouth daily for 30 days.  . valsartan (DIOVAN) 80 MG tablet Take 1 tablet (80 mg total) by mouth daily.  Alveda Reasons 20 MG TABS tablet TAKE 1 TABLET (20 MG TOTAL) BY MOUTH DAILY WITH SUPPER FOR 30 DAYS.  . [DISCONTINUED] doxycycline (VIBRA-TABS) 100 MG tablet Take 1 tablet (100 mg total) by mouth 2 (two) times daily.   No facility-administered encounter medications on file as of 06/25/2019.     Allergies  Allergen Reactions  . Morphine And Related Nausea And Vomiting    Review of Systems  Constitutional: Positive for activity change, fatigue and malaise/fatigue. Negative for appetite change, chills, diaphoresis, fever, unexpected weight change and weight loss.  HENT: Negative.   Eyes: Negative.  Negative for blurred vision, photophobia and visual disturbance.  Respiratory: Positive for shortness of breath. Negative for cough, choking, chest tightness and wheezing.   Cardiovascular: Positive for leg swelling. Negative for chest pain, palpitations, orthopnea, syncope and PND.  Gastrointestinal: Negative for abdominal distention, abdominal pain, anal bleeding, blood in stool, constipation, diarrhea, nausea, rectal pain and vomiting.  Endocrine: Negative.  Negative for polydipsia, polyphagia and polyuria.  Genitourinary: Negative for decreased urine volume, difficulty urinating, discharge, dysuria, frequency, hematuria, penile pain, penile swelling, scrotal swelling, testicular pain and urgency.  Musculoskeletal: Negative for arthralgias,  gait problem, myalgias, neck pain and neck stiffness.  Skin: Negative.  Negative for pallor.  Allergic/Immunologic: Negative.   Neurological: Negative for dizziness, tremors, seizures, syncope, facial asymmetry, speech difficulty, weakness, light-headedness, numbness and headaches.  Hematological: Negative.  Negative for adenopathy. Does not bruise/bleed easily.  Psychiatric/Behavioral: Positive for dysphoric mood. Negative for agitation, behavioral problems, confusion, decreased concentration, hallucinations, self-injury, sleep disturbance and suicidal ideas. The patient is not nervous/anxious and is not hyperactive.   All other systems reviewed and are negative.        Observations/Objective: No vital signs or physical exam, this was a telephone or virtual health encounter.  Pt alert and oriented, answers all questions appropriately, and able to speak in full sentences.    Assessment and Plan: Richard Crawford was seen today for medical management of chronic issues, hypertension, diabetes and atrial fibrillation.  Diagnoses and all orders for this visit:  Hypertension associated with diabetes ([place]) Stable. Labs pending. DASH diet and exercise encouraged. Report any persistent high or low readings.  -  Thyroid Panel With TSH; Future -     CMP14+EGFR; Future -     CBC with Differential/Platelet; Future  Type 2 diabetes mellitus without complication, without long-term current use of insulin ([place]) Labs pending. Will adjust therapy if warranted. Has follow up with endocrinology next month.  -     Bayer DCA Hb A1c Waived; Future -     CMP14+EGFR; Future  Hyperlipidemia associated with type 2 diabetes mellitus ([place]) Labs pending. Diet and exercise encouraged.  -     Lipid panel; Future  Morbid obesity with BMI of 50.0-59.9, adult ([facility]) Diet and exercise encouraged. Labs ordered.  -     Bayer DCA Hb A1c Waived; Future -     Testosterone,Free and Total; Future -     Thyroid Panel With  TSH; Future -     Lipid panel; Future -     CMP14+EGFR; Future -     CBC with Differential/Platelet; Future  Chronic heart failure with preserved ejection fraction ([place]) Doing well. No significant edema or weight changes. Followed by cardiology.   Diabetic mononeuropathy associated with type 2 diabetes mellitus ([place]) Onset of bilateral lower extremity paresthesias in last 2 months. B12 normal. Will trial gabapentin to see if beneficial. Report any new or worsening problems.  -     gabapentin (NEURONTIN) 100 MG capsule; Take 1 capsule (100 mg total) by mouth 3 (three) times daily.  Other fatigue Ongoing for several months. Will check below.  -     Testosterone,Free and Total; Future -     Thyroid Panel With TSH; Future -     CMP14+EGFR; Future -     CBC with Differential/Platelet; Future  Low libido Ongoing for several months. Was previously on repletion therapy. Will check levels to see if therapy needs to reinitiated.  -     Testosterone,Free and Total; Future -     Thyroid Panel With TSH; Future     Follow Up Instructions: Return in about 3 months (around 09/24/2019), or if symptoms worsen or fail to improve, for DM, HTN, Lipids.    I discussed the assessment and treatment plan with the patient. The patient was provided an opportunity to ask questions and all were answered. The patient agreed with the plan and demonstrated an understanding of the instructions.   The patient was advised to call back or seek an in-person evaluation if the symptoms worsen or if the condition fails to improve as anticipated.  The above assessment and management plan was discussed with the patient. The patient verbalized understanding of and has agreed to the management plan. Patient is aware to call the clinic if they develop any new symptoms or if symptoms persist or worsen. Patient is aware when to return to the clinic for a follow-up visit. Patient educated on when it is appropriate to go to the  emergency department.    I provided 25 minutes of non-face-to-face time during this encounter. The call started at 1450. The call ended at 1515. The other time was used for coordination of care.    Monia Pouch, FNP-C [facility] [address] [place], [place] [address] [phone] 06/25/19

## 2019-06-26 ENCOUNTER — Other Ambulatory Visit: Payer: Self-pay

## 2019-06-26 ENCOUNTER — Other Ambulatory Visit: Payer: BC Managed Care – PPO

## 2019-06-26 DIAGNOSIS — R6882 Decreased libido: Secondary | ICD-10-CM

## 2019-06-26 DIAGNOSIS — E1169 Type 2 diabetes mellitus with other specified complication: Secondary | ICD-10-CM

## 2019-06-26 DIAGNOSIS — I10 Essential (primary) hypertension: Secondary | ICD-10-CM | POA: Diagnosis not present

## 2019-06-26 DIAGNOSIS — Z6841 Body Mass Index (BMI) 40.0 and over, adult: Secondary | ICD-10-CM | POA: Diagnosis not present

## 2019-06-26 DIAGNOSIS — E1159 Type 2 diabetes mellitus with other circulatory complications: Secondary | ICD-10-CM | POA: Diagnosis not present

## 2019-06-26 DIAGNOSIS — R5383 Other fatigue: Secondary | ICD-10-CM

## 2019-06-26 DIAGNOSIS — E119 Type 2 diabetes mellitus without complications: Secondary | ICD-10-CM | POA: Diagnosis not present

## 2019-06-26 DIAGNOSIS — I152 Hypertension secondary to endocrine disorders: Secondary | ICD-10-CM

## 2019-06-26 LAB — BAYER DCA HB A1C WAIVED: HB A1C (BAYER DCA - WAIVED): 7.2 % — ABNORMAL HIGH (ref <7.0)

## 2019-06-28 LAB — CMP14+EGFR
ALT: 17 IU/L (ref 0–44)
AST: 19 IU/L (ref 0–40)
Albumin/Globulin Ratio: 1.3 (ref 1.2–2.2)
Albumin: 4.3 g/dL (ref 3.8–4.8)
Alkaline Phosphatase: 94 IU/L (ref 39–117)
BUN/Creatinine Ratio: 15 (ref 10–24)
BUN: 18 mg/dL (ref 8–27)
Bilirubin Total: 0.8 mg/dL (ref 0.0–1.2)
CO2: 27 mmol/L (ref 20–29)
Calcium: 9.3 mg/dL (ref 8.6–10.2)
Chloride: 99 mmol/L (ref 96–106)
Creatinine, Ser: 1.19 mg/dL (ref 0.76–1.27)
GFR calc Af Amer: 74 mL/min/{1.73_m2} (ref >59)
GFR calc non Af Amer: 64 mL/min/{1.73_m2} (ref >59)
Globulin, Total: 3.3 g/dL (ref 1.5–4.5)
Glucose: 132 mg/dL — ABNORMAL HIGH (ref 65–99)
Potassium: 4.6 mmol/L (ref 3.5–5.2)
Sodium: 139 mmol/L (ref 134–144)
Total Protein: 7.6 g/dL (ref 6.0–8.5)

## 2019-06-28 LAB — THYROID PANEL WITH TSH
Free Thyroxine Index: 2.4 (ref 1.2–4.9)
T3 Uptake Ratio: 27 % (ref 24–39)
T4, Total: 8.9 ug/dL (ref 4.5–12.0)
TSH: 1.59 u[IU]/mL (ref 0.450–4.500)

## 2019-06-28 LAB — CBC WITH DIFFERENTIAL/PLATELET
Basophils Absolute: 0.1 10*3/uL (ref 0.0–0.2)
Basos: 1 %
EOS (ABSOLUTE): 0.3 10*3/uL (ref 0.0–0.4)
Eos: 3 %
Hematocrit: 38.9 % (ref 37.5–51.0)
Hemoglobin: 12.9 g/dL — ABNORMAL LOW (ref 13.0–17.7)
Immature Grans (Abs): 0 10*3/uL (ref 0.0–0.1)
Immature Granulocytes: 0 %
Lymphocytes Absolute: 3.3 10*3/uL — ABNORMAL HIGH (ref 0.7–3.1)
Lymphs: 37 %
MCH: 28.9 pg (ref 26.6–33.0)
MCHC: 33.2 g/dL (ref 31.5–35.7)
MCV: 87 fL (ref 79–97)
Monocytes Absolute: 1.3 10*3/uL — ABNORMAL HIGH (ref 0.1–0.9)
Monocytes: 15 %
Neutrophils Absolute: 3.9 10*3/uL (ref 1.4–7.0)
Neutrophils: 44 %
Platelets: 266 10*3/uL (ref 150–450)
RBC: 4.47 x10E6/uL (ref 4.14–5.80)
RDW: 12.5 % (ref 11.6–15.4)
WBC: 8.9 10*3/uL (ref 3.4–10.8)

## 2019-06-28 LAB — LIPID PANEL
Chol/HDL Ratio: 2.6 ratio (ref 0.0–5.0)
Cholesterol, Total: 86 mg/dL — ABNORMAL LOW (ref 100–199)
HDL: 33 mg/dL — ABNORMAL LOW (ref >39)
LDL Chol Calc (NIH): 35 mg/dL (ref 0–99)
Triglycerides: 90 mg/dL (ref 0–149)
VLDL Cholesterol Cal: 18 mg/dL (ref 5–40)

## 2019-06-28 LAB — TESTOSTERONE,FREE AND TOTAL
Testosterone, Free: 2.8 pg/mL — ABNORMAL LOW (ref 6.6–18.1)
Testosterone: 104 ng/dL — ABNORMAL LOW (ref 264–916)

## 2019-06-29 ENCOUNTER — Other Ambulatory Visit: Payer: Self-pay | Admitting: *Deleted

## 2019-06-29 DIAGNOSIS — I5032 Chronic diastolic (congestive) heart failure: Secondary | ICD-10-CM

## 2019-06-29 DIAGNOSIS — E1159 Type 2 diabetes mellitus with other circulatory complications: Secondary | ICD-10-CM

## 2019-06-29 DIAGNOSIS — R6882 Decreased libido: Secondary | ICD-10-CM

## 2019-06-29 DIAGNOSIS — E1141 Type 2 diabetes mellitus with diabetic mononeuropathy: Secondary | ICD-10-CM

## 2019-06-29 DIAGNOSIS — I152 Hypertension secondary to endocrine disorders: Secondary | ICD-10-CM

## 2019-07-12 ENCOUNTER — Other Ambulatory Visit: Payer: Self-pay | Admitting: *Deleted

## 2019-07-12 DIAGNOSIS — Z20828 Contact with and (suspected) exposure to other viral communicable diseases: Secondary | ICD-10-CM | POA: Diagnosis not present

## 2019-07-12 DIAGNOSIS — Z20822 Contact with and (suspected) exposure to covid-19: Secondary | ICD-10-CM

## 2019-07-13 LAB — NOVEL CORONAVIRUS, NAA: SARS-CoV-2, NAA: NOT DETECTED

## 2019-07-16 ENCOUNTER — Other Ambulatory Visit: Payer: Self-pay | Admitting: Family Medicine

## 2019-08-03 ENCOUNTER — Emergency Department (HOSPITAL_COMMUNITY): Payer: BC Managed Care – PPO

## 2019-08-03 ENCOUNTER — Encounter (HOSPITAL_COMMUNITY): Payer: Self-pay | Admitting: *Deleted

## 2019-08-03 ENCOUNTER — Other Ambulatory Visit: Payer: Self-pay

## 2019-08-03 ENCOUNTER — Encounter: Payer: Self-pay | Admitting: Physician Assistant

## 2019-08-03 ENCOUNTER — Ambulatory Visit (INDEPENDENT_AMBULATORY_CARE_PROVIDER_SITE_OTHER): Payer: BC Managed Care – PPO | Admitting: Physician Assistant

## 2019-08-03 ENCOUNTER — Inpatient Hospital Stay (HOSPITAL_COMMUNITY)
Admission: EM | Admit: 2019-08-03 | Discharge: 2019-08-07 | DRG: 872 | Disposition: A | Discharge status: Home / Self Care | Payer: BC Managed Care – PPO | Attending: Family Medicine | Admitting: Family Medicine

## 2019-08-03 DIAGNOSIS — I5032 Chronic diastolic (congestive) heart failure: Secondary | ICD-10-CM | POA: Diagnosis present

## 2019-08-03 DIAGNOSIS — Z7901 Long term (current) use of anticoagulants: Secondary | ICD-10-CM

## 2019-08-03 DIAGNOSIS — E1141 Type 2 diabetes mellitus with diabetic mononeuropathy: Secondary | ICD-10-CM | POA: Diagnosis not present

## 2019-08-03 DIAGNOSIS — F40240 Claustrophobia: Secondary | ICD-10-CM | POA: Diagnosis present

## 2019-08-03 DIAGNOSIS — Z981 Arthrodesis status: Secondary | ICD-10-CM

## 2019-08-03 DIAGNOSIS — Z20828 Contact with and (suspected) exposure to other viral communicable diseases: Secondary | ICD-10-CM | POA: Diagnosis not present

## 2019-08-03 DIAGNOSIS — M25569 Pain in unspecified knee: Secondary | ICD-10-CM

## 2019-08-03 DIAGNOSIS — G4733 Obstructive sleep apnea (adult) (pediatric): Secondary | ICD-10-CM | POA: Diagnosis not present

## 2019-08-03 DIAGNOSIS — I10 Essential (primary) hypertension: Secondary | ICD-10-CM | POA: Diagnosis not present

## 2019-08-03 DIAGNOSIS — G43909 Migraine, unspecified, not intractable, without status migrainosus: Secondary | ICD-10-CM | POA: Diagnosis present

## 2019-08-03 DIAGNOSIS — M791 Myalgia, unspecified site: Secondary | ICD-10-CM

## 2019-08-03 DIAGNOSIS — Z79899 Other long term (current) drug therapy: Secondary | ICD-10-CM

## 2019-08-03 DIAGNOSIS — M19012 Primary osteoarthritis, left shoulder: Secondary | ICD-10-CM | POA: Diagnosis present

## 2019-08-03 DIAGNOSIS — I4891 Unspecified atrial fibrillation: Secondary | ICD-10-CM | POA: Diagnosis not present

## 2019-08-03 DIAGNOSIS — I4821 Permanent atrial fibrillation: Secondary | ICD-10-CM | POA: Diagnosis not present

## 2019-08-03 DIAGNOSIS — I361 Nonrheumatic tricuspid (valve) insufficiency: Secondary | ICD-10-CM | POA: Diagnosis not present

## 2019-08-03 DIAGNOSIS — R509 Fever, unspecified: Secondary | ICD-10-CM | POA: Diagnosis present

## 2019-08-03 DIAGNOSIS — Z7952 Long term (current) use of systemic steroids: Secondary | ICD-10-CM

## 2019-08-03 DIAGNOSIS — R531 Weakness: Secondary | ICD-10-CM | POA: Diagnosis present

## 2019-08-03 DIAGNOSIS — E119 Type 2 diabetes mellitus without complications: Secondary | ICD-10-CM | POA: Diagnosis not present

## 2019-08-03 DIAGNOSIS — E785 Hyperlipidemia, unspecified: Secondary | ICD-10-CM | POA: Diagnosis present

## 2019-08-03 DIAGNOSIS — F419 Anxiety disorder, unspecified: Secondary | ICD-10-CM | POA: Diagnosis present

## 2019-08-03 DIAGNOSIS — E1159 Type 2 diabetes mellitus with other circulatory complications: Secondary | ICD-10-CM | POA: Diagnosis not present

## 2019-08-03 DIAGNOSIS — Z8249 Family history of ischemic heart disease and other diseases of the circulatory system: Secondary | ICD-10-CM

## 2019-08-03 DIAGNOSIS — Z79890 Hormone replacement therapy (postmenopausal): Secondary | ICD-10-CM

## 2019-08-03 DIAGNOSIS — Z96611 Presence of right artificial shoulder joint: Secondary | ICD-10-CM | POA: Diagnosis present

## 2019-08-03 DIAGNOSIS — R3129 Other microscopic hematuria: Secondary | ICD-10-CM | POA: Diagnosis present

## 2019-08-03 DIAGNOSIS — Z85850 Personal history of malignant neoplasm of thyroid: Secondary | ICD-10-CM

## 2019-08-03 DIAGNOSIS — M8580 Other specified disorders of bone density and structure, unspecified site: Secondary | ICD-10-CM | POA: Diagnosis present

## 2019-08-03 DIAGNOSIS — M25511 Pain in right shoulder: Secondary | ICD-10-CM | POA: Diagnosis not present

## 2019-08-03 DIAGNOSIS — R000 Tachycardia, unspecified: Secondary | ICD-10-CM | POA: Diagnosis present

## 2019-08-03 DIAGNOSIS — Z809 Family history of malignant neoplasm, unspecified: Secondary | ICD-10-CM

## 2019-08-03 DIAGNOSIS — Z5329 Procedure and treatment not carried out because of patient's decision for other reasons: Secondary | ICD-10-CM | POA: Diagnosis not present

## 2019-08-03 DIAGNOSIS — I152 Hypertension secondary to endocrine disorders: Secondary | ICD-10-CM | POA: Diagnosis present

## 2019-08-03 DIAGNOSIS — G8929 Other chronic pain: Secondary | ICD-10-CM | POA: Diagnosis not present

## 2019-08-03 DIAGNOSIS — R6883 Chills (without fever): Secondary | ICD-10-CM

## 2019-08-03 DIAGNOSIS — W57XXXA Bitten or stung by nonvenomous insect and other nonvenomous arthropods, initial encounter: Secondary | ICD-10-CM | POA: Diagnosis present

## 2019-08-03 DIAGNOSIS — A419 Sepsis, unspecified organism: Secondary | ICD-10-CM | POA: Diagnosis not present

## 2019-08-03 DIAGNOSIS — R918 Other nonspecific abnormal finding of lung field: Secondary | ICD-10-CM | POA: Diagnosis not present

## 2019-08-03 DIAGNOSIS — K8020 Calculus of gallbladder without cholecystitis without obstruction: Secondary | ICD-10-CM | POA: Diagnosis not present

## 2019-08-03 DIAGNOSIS — M353 Polymyalgia rheumatica: Secondary | ICD-10-CM | POA: Diagnosis present

## 2019-08-03 DIAGNOSIS — Z6841 Body Mass Index (BMI) 40.0 and over, adult: Secondary | ICD-10-CM

## 2019-08-03 DIAGNOSIS — E1169 Type 2 diabetes mellitus with other specified complication: Secondary | ICD-10-CM | POA: Diagnosis not present

## 2019-08-03 DIAGNOSIS — Z7984 Long term (current) use of oral hypoglycemic drugs: Secondary | ICD-10-CM

## 2019-08-03 DIAGNOSIS — E890 Postprocedural hypothyroidism: Secondary | ICD-10-CM | POA: Diagnosis present

## 2019-08-03 LAB — BASIC METABOLIC PANEL
Anion gap: 14 (ref 5–15)
BUN: 26 mg/dL — ABNORMAL HIGH (ref 8–23)
CO2: 21 mmol/L — ABNORMAL LOW (ref 22–32)
Calcium: 9.3 mg/dL (ref 8.9–10.3)
Chloride: 97 mmol/L — ABNORMAL LOW (ref 98–111)
Creatinine, Ser: 1.38 mg/dL — ABNORMAL HIGH (ref 0.61–1.24)
GFR calc Af Amer: >60 mL/min (ref >60)
GFR calc non Af Amer: 54 mL/min — ABNORMAL LOW (ref >60)
Glucose, Bld: 221 mg/dL — ABNORMAL HIGH (ref 70–99)
Potassium: 3.6 mmol/L (ref 3.5–5.1)
Sodium: 132 mmol/L — ABNORMAL LOW (ref 135–145)

## 2019-08-03 LAB — URINALYSIS, ROUTINE W REFLEX MICROSCOPIC
Bacteria, UA: NONE SEEN
Bilirubin Urine: NEGATIVE
Glucose, UA: NEGATIVE mg/dL
Ketones, ur: NEGATIVE mg/dL
Leukocytes,Ua: NEGATIVE
Nitrite: NEGATIVE
Protein, ur: NEGATIVE mg/dL
Specific Gravity, Urine: 1.015 (ref 1.005–1.030)
pH: 7 (ref 5.0–8.0)

## 2019-08-03 LAB — CBC WITH DIFFERENTIAL/PLATELET
Abs Immature Granulocytes: 0.08 10*3/uL — ABNORMAL HIGH (ref 0.00–0.07)
Basophils Absolute: 0.1 10*3/uL (ref 0.0–0.1)
Basophils Relative: 0 %
Eosinophils Absolute: 0.1 10*3/uL (ref 0.0–0.5)
Eosinophils Relative: 1 %
HCT: 38.7 % — ABNORMAL LOW (ref 39.0–52.0)
Hemoglobin: 12.8 g/dL — ABNORMAL LOW (ref 13.0–17.0)
Immature Granulocytes: 1 %
Lymphocytes Relative: 8 %
Lymphs Abs: 1.2 10*3/uL (ref 0.7–4.0)
MCH: 29 pg (ref 26.0–34.0)
MCHC: 33.1 g/dL (ref 30.0–36.0)
MCV: 87.6 fL (ref 80.0–100.0)
Monocytes Absolute: 1.8 10*3/uL — ABNORMAL HIGH (ref 0.1–1.0)
Monocytes Relative: 12 %
Neutro Abs: 12.5 10*3/uL — ABNORMAL HIGH (ref 1.7–7.7)
Neutrophils Relative %: 78 %
Platelets: 227 10*3/uL (ref 150–400)
RBC: 4.42 MIL/uL (ref 4.22–5.81)
RDW: 13.2 % (ref 11.5–15.5)
WBC: 15.8 10*3/uL — ABNORMAL HIGH (ref 4.0–10.5)
nRBC: 0 % (ref 0.0–0.2)

## 2019-08-03 LAB — LACTIC ACID, PLASMA: Lactic Acid, Venous: 2 mmol/L (ref 0.5–1.9)

## 2019-08-03 MED ORDER — HYDROMORPHONE HCL 1 MG/ML IJ SOLN
1.0000 mg | Freq: Once | INTRAMUSCULAR | Status: AC
  Administered 2019-08-03: 23:00:00 1 mg via INTRAVENOUS
  Filled 2019-08-03: qty 1

## 2019-08-03 MED ORDER — ACETAMINOPHEN 500 MG PO TABS
1000.0000 mg | ORAL_TABLET | Freq: Once | ORAL | Status: AC
  Administered 2019-08-03: 1000 mg via ORAL
  Filled 2019-08-03: qty 2

## 2019-08-03 MED ORDER — SODIUM CHLORIDE 0.9 % IV SOLN
1.0000 g | Freq: Once | INTRAVENOUS | Status: AC
  Administered 2019-08-04: 1 g via INTRAVENOUS
  Filled 2019-08-03: qty 10

## 2019-08-03 MED ORDER — ONDANSETRON HCL 4 MG/2ML IJ SOLN
4.0000 mg | Freq: Once | INTRAMUSCULAR | Status: AC
  Administered 2019-08-03: 4 mg via INTRAVENOUS
  Filled 2019-08-03: qty 2

## 2019-08-03 MED ORDER — HYDROMORPHONE HCL 1 MG/ML IJ SOLN
INTRAMUSCULAR | Status: AC
  Administered 2019-08-03: 1 mg via INTRAVENOUS
  Filled 2019-08-03: qty 1

## 2019-08-03 MED ORDER — SODIUM CHLORIDE 0.9 % IV BOLUS
1000.0000 mL | Freq: Once | INTRAVENOUS | Status: AC
  Administered 2019-08-03: 1000 mL via INTRAVENOUS

## 2019-08-03 MED ORDER — DOXYCYCLINE HYCLATE 100 MG PO TABS: 100.0000 mg | ORAL_TABLET | Freq: Two times a day (BID) | ORAL | 0 refills | Status: DC

## 2019-08-03 MED ORDER — PREDNISONE 10 MG (48) PO TBPK: ORAL_TABLET | ORAL | 0 refills | Status: DC

## 2019-08-03 NOTE — ED Provider Notes (Signed)
Care assumed from Dr. Roderic Palau.  Patient here with fever and right shoulder pain.  History of shoulder replacement on chronic pain.  Febrile to 102.  Believes he slept on his shoulder wrong.  Placed on doxycycline for possible "spider bite" to his legs but none seen on exam.  Fever of uncertain source.  Discussed possibility of septic joint with patient.  He adamantly refuses arthrocentesis.  He does not want to range his right shoulder.  It is not warm or red however.  Discussed with patient that arthrocentesis would be recommended for further evaluation to rule out septic joint versus infection. He refuses this.  He also takes Xarelto.  Blood cultures obtained broad-spectrum antibiotics started.  Coronavirus test sent.  Plan admission for ongoing fever with unclear source.  Coronavirus test pending.  Discussed with Dr. Maudie Mercury. Patient continues to refuse arthrocentesis.  Richard Crawford was evaluated in Emergency Department on 08/04/2019 for the symptoms described in the history of present illness. He was evaluated in the context of the global COVID-19 pandemic, which necessitated consideration that the patient might be at risk for infection with the SARS-CoV-2 virus that causes COVID-19. Institutional protocols and algorithms that pertain to the evaluation of patients at risk for COVID-19 are in a state of rapid change based on information released by regulatory bodies including the CDC and federal and state organizations. These policies and algorithms were followed during the patient's care in the ED.    Ezequiel Essex, MD 08/04/19 [phone]

## 2019-08-03 NOTE — ED Notes (Signed)
Patient yelling out in pain asking for pain medication at this time. Patient breathing fast, reminded to slow his breathing so he would not hypoventilate.

## 2019-08-03 NOTE — ED Notes (Signed)
Pt sitting behind triage- yelling out, c/o pain, moaning- pt came in by EMS, wearing only his boxer's pt is obese and cannot fit in paper scrubs. Pt family was on way up here per EMS, however no one has arrived yet. Triage nurse, Theadora Rama, RN called this charge nurse to inform pt was disturbing other patients and she was unable to hear to triage patients. This nurse spoke with pt and explained he would have to lower his voice. Explained that he should be waiting in the lobby, but I was making an exception to this rule, b/c he was not dressed. Informed that pt would be moved to lobby if he could not lower his voice. Pt verbalized understanding.

## 2019-08-03 NOTE — ED Provider Notes (Signed)
[facility] EMERGENCY DEPARTMENT Provider Note   CSN: [id number] Arrival date & time: 08/03/19  [id number]     History   Chief Complaint Chief Complaint  Patient presents with  . Shoulder Pain  . Fever    HPI Richard Crawford is a 63 y.o. male.     Patient complains of fevers aches and worsening right shoulder pain.  Patient has a history of a shoulder replacement.  Patient also thinks maybe he was bit by a spider but did not see any spider.  The history is provided by the patient. No language interpreter was used.  Shoulder Pain Upper extremity pain location: Right shoulder. Pain details:    Quality:  Aching   Radiates to:  Does not radiate   Severity:  Moderate   Onset quality:  Sudden   Timing:  Constant   Progression:  Worsening Associated symptoms: fever   Associated symptoms: no back pain and no fatigue   Fever Associated symptoms: no chest pain, no congestion, no cough, no diarrhea, no headaches and no rash     Past Medical History:  Diagnosis Date  . Anxiety   . Arthritis    left shoulder  . Atrial fibrillation ([place])   . Cancer ([place]) 1989   thyroid  . Cellulitis   . Chronic heart failure with preserved ejection fraction ([place])   . Diabetes mellitus without complication ([place])   . Hemorrhoids    external  . Hx of colonic polyps   . Hyperlipidemia   . Hypertension   . Migraine   . Papillary thyroid carcinoma ([place])   . Postoperative hypothyroidism   . Primary osteoarthritis of left shoulder   . Sleep apnea   . Venous stasis dermatitis of both lower extremities     Patient Active Problem List   Diagnosis Date Noted  . Diabetic mononeuropathy associated with type 2 diabetes mellitus ([place]) 06/25/2019  . Low libido 06/25/2019  . Hypertensive retinopathy of both eyes 03/21/2019  . Permanent atrial fibrillation ([place]) 03/09/2019  . Fatigue 03/09/2019  . Chronic diastolic HF (heart failure) ([place]) 03/09/2019  . Educated about COVID-19 virus infection  03/09/2019  . Morbid obesity with BMI of 50.0-59.9, adult ([place]) 12/15/2018  . History of colonic polyps 12/15/2018  . Skin abnormalities 12/15/2018  . Generalized anxiety disorder 06/29/2018  . Chronic heart failure with preserved ejection fraction ([place]) 05/01/2018  . Hyperlipidemia associated with type 2 diabetes mellitus ([place]) 02/08/2018  . Venous stasis dermatitis of both lower extremities 01/05/2018  . Obstructive sleep apnea 01/05/2018  . Papillary thyroid carcinoma ([place]) 11/29/2017  . Primary osteoarthritis of left shoulder 11/15/2017  . Combined forms of age-related cataract of both eyes 11/14/2017  . Myopia with astigmatism and presbyopia, bilateral 11/14/2017  . Hypertension associated with diabetes ([place]) 09/26/2017  . Corns and callosity 09/26/2017  . Postoperative hypothyroidism 09/26/2017  . Type 2 diabetes mellitus without complication, without long-term current use of insulin ([place]) 09/26/2017  . Rapid atrial fibrillation ([place]) 09/16/2016    Past Surgical History:  Procedure Laterality Date  . ANKLE FUSION    . KNEE ARTHROSCOPY    . SHOULDER ARTHROSCOPY          Home Medications    Prior to Admission medications   Medication Sig Start Date End Date Taking? Authorizing Provider  atorvastatin (LIPITOR) 20 MG tablet Take 1 tablet (20 mg total) by mouth daily. 02/22/19   Baruch Gouty, FNP  celecoxib (CELEBREX) 200 MG capsule TAKE 1 CAPSULE (200 MG TOTAL)  BY MOUTH DAILY FOR 30 DAYS. 07/16/19 08/15/19  Baruch Gouty, FNP  diltiazem (CARDIZEM CD) 360 MG 24 hr capsule Take 1 capsule (360 mg total) by mouth daily. 03/07/19   Minus Breeding, MD  doxycycline (VIBRA-TABS) 100 MG tablet Take 1 tablet (100 mg total) by mouth 2 (two) times daily. 1 po bid 08/03/19   Terald Sleeper, PA-C  furosemide (LASIX) 80 MG tablet Take 1 tablet (80 mg total) by mouth daily for 30 days. 03/20/19 04/19/19  Baruch Gouty, FNP  gabapentin (NEURONTIN) 100 MG capsule Take 1 capsule (100 mg total) by  mouth 3 (three) times daily. 06/25/19   Baruch Gouty, FNP  levothyroxine (SYNTHROID) 200 MCG tablet Take 1 tablet (200 mcg total) by mouth daily before breakfast. 05/30/19   Nida, Marella Chimes, MD  levothyroxine (SYNTHROID) 50 MCG tablet Take 1 tablet (50 mcg total) by mouth daily before breakfast. 05/30/19   Nida, Marella Chimes, MD  metFORMIN (GLUCOPHAGE-XR) 750 MG 24 hr tablet TAKE 1 TABLET BY MOUTH EVERY DAY WITH BREAKFAST 06/05/19   Baruch Gouty, FNP  Multiple Vitamin (MULTIVITAMIN) tablet Take 1 tablet by mouth 2 (two) times daily.    [provider]  predniSONE (STERAPRED UNI-PAK 48 TAB) 10 MG (48) TBPK tablet Take as directed for 12 days 08/03/19   Terald Sleeper, PA-C  spironolactone (ALDACTONE) 25 MG tablet Take 1 tablet (25 mg total) by mouth daily for 30 days. 03/20/19 04/19/19  Baruch Gouty, FNP  valsartan (DIOVAN) 80 MG tablet Take 1 tablet (80 mg total) by mouth daily. 06/13/19   Rakes, Connye Burkitt, FNP  XARELTO 20 MG TABS tablet TAKE 1 TABLET (20 MG TOTAL) BY MOUTH DAILY WITH SUPPER FOR 30 DAYS. 04/30/19   Baruch Gouty, FNP    Family History Family History  Problem Relation Age of Onset  . Cancer Mother        breast, stomach, ovarian  . Hypertension Mother   . Dementia Mother   . Heart disease Father     Social History Social History   Tobacco Use  . Smoking status: Never Smoker  . Smokeless tobacco: Never Used  Substance Use Topics  . Alcohol use: Not Currently  . Drug use: Never     Allergies   Morphine and related   Review of Systems Review of Systems  Constitutional: Positive for fever. Negative for appetite change and fatigue.  HENT: Negative for congestion, ear discharge and sinus pressure.   Eyes: Negative for discharge.  Respiratory: Negative for cough.   Cardiovascular: Negative for chest pain.  Gastrointestinal: Negative for abdominal pain and diarrhea.  Genitourinary: Negative for frequency and hematuria.  Musculoskeletal: Negative for  back pain.       Painful right shoulder with fever and chills  Skin: Negative for rash.  Neurological: Negative for seizures and headaches.  Psychiatric/Behavioral: Negative for hallucinations.     Physical Exam Updated Vital Signs BP (!) 115/56   Pulse 90   Temp (!) 102.5 F (39.2 C) (Oral)   Resp 20   Ht 6\' 3"  (1.905 m)   Wt (!) 181.9 kg   SpO2 98%   BMI 50.12 kg/m   Physical Exam Vitals signs and nursing note reviewed.  Constitutional:      Appearance: He is well-developed.  HENT:     Head: Normocephalic.     Nose: Nose normal.  Eyes:     General: No scleral icterus.    Conjunctiva/sclera: Conjunctivae normal.  Neck:     Musculoskeletal: Neck supple.     Thyroid: No thyromegaly.  Cardiovascular:     Rate and Rhythm: Normal rate and regular rhythm.     Heart sounds: No murmur. No friction rub. No gallop.   Pulmonary:     Breath sounds: No stridor. No wheezing or rales.  Chest:     Chest wall: No tenderness.  Abdominal:     General: There is no distension.     Tenderness: There is no abdominal tenderness. There is no rebound.  Musculoskeletal:     Comments: Decreased range of motion in right shoulder with significant discomfort  Lymphadenopathy:     Cervical: No cervical adenopathy.  Skin:    Findings: No erythema or rash.  Neurological:     Mental Status: He is oriented to person, place, and time.     Motor: No abnormal muscle tone.     Coordination: Coordination normal.  Psychiatric:        Behavior: Behavior normal.      ED Treatments / Results  Labs (all labs ordered are listed, but only abnormal results are displayed) Labs Reviewed  CBC WITH DIFFERENTIAL/PLATELET - Abnormal; Notable for the following components:      Result Value   WBC 15.8 (*)    Hemoglobin 12.8 (*)    HCT 38.7 (*)    Neutro Abs 12.5 (*)    Monocytes Absolute 1.8 (*)    Abs Immature Granulocytes 0.08 (*)    All other components within normal limits  BASIC METABOLIC PANEL  - Abnormal; Notable for the following components:   Sodium 132 (*)    Chloride 97 (*)    CO2 21 (*)    Glucose, Bld 221 (*)    BUN 26 (*)    Creatinine, Ser 1.38 (*)    GFR calc non Af Amer 54 (*)    All other components within normal limits  LACTIC ACID, PLASMA - Abnormal; Notable for the following components:   Lactic Acid, Venous 2.0 (*)    All other components within normal limits  URINALYSIS, ROUTINE W REFLEX MICROSCOPIC - Abnormal; Notable for the following components:   Hgb urine dipstick MODERATE (*)    All other components within normal limits  URINE CULTURE  CULTURE, BLOOD (ROUTINE X 2)  CULTURE, BLOOD (ROUTINE X 2)  SARS CORONAVIRUS 2 (TAT 6-24 HRS)  LACTIC ACID, PLASMA  SEDIMENTATION RATE  C-REACTIVE PROTEIN    EKG None  Radiology Dg Shoulder Right  Result Date: 08/03/2019 CLINICAL DATA:  Pain in the right shoulder EXAM: RIGHT SHOULDER - 2+ VIEW COMPARISON:  None. FINDINGS: No acute fracture is seen. The patient has had a prior humeral head arthroplasty. There is diffuse osteopenia. No periprosthetic lucency is seen. No significant overlying soft tissue swelling. IMPRESSION: No acute osseous abnormality. Electronically Signed   By: Prudencio Pair M.D.   On: 08/03/2019 21:41    Procedures Procedures (including critical care time)  Medications Ordered in ED Medications  acetaminophen (TYLENOL) tablet 1,000 mg (1,000 mg Oral Given 08/03/19 2044)  sodium chloride 0.9 % bolus 1,000 mL (1,000 mLs Intravenous New Bag/Given 08/03/19 2253)  HYDROmorphone (DILAUDID) injection 1 mg (1 mg Intravenous Given 08/03/19 2252)  ondansetron (ZOFRAN) injection 4 mg (4 mg Intravenous Given 08/03/19 2252)     Initial Impression / Assessment and Plan / ED Course  I have reviewed the triage vital signs and the nursing notes.  Pertinent labs & imaging results that were available during  my care of the patient were reviewed by me and considered in my medical decision making (see chart  for details).     Patient with fevers chills and right shoulder discomfort.  Patient will have blood cultures done and antibiotics started.  Possible infection in right shoulder and possible viral infection.  Patient will be admitted to the hospitalist for pain control antibiotics and possible orthopedic consult  Final Clinical Impressions(s) / ED Diagnoses   Final diagnoses:  None    ED Discharge Orders    None       Milton Ferguson, MD 08/03/19 2343

## 2019-08-03 NOTE — ED Notes (Signed)
Patient assisted with urinal, patient was able to stand at bedside. Urine specimen collected at this time

## 2019-08-03 NOTE — Progress Notes (Signed)
Telephone visit  Subjective: CC: Cellulitis PCP: Baruch Gouty, FNP [id number] Richard Crawford is a 63 y.o. male calls for telephone consult today. Patient provides verbal consent for consult held via phone.  Patient is identified with 2 separate identifiers.  At this time the entire area is on COVID-19 social distancing and stay home orders are in place.  Patient is of higher risk and therefore we are performing this by a virtual method.  Location of patient: Home Location of provider: HOME Others present for call: Wife   Patient feels like that he got bit on his lower leg.  States he did not feel the bite.  He thought he had some chills but it seems to be better now his wife squeezes calf and he does not say that it is sore.  He states that he does feel warmer.  He cannot tell a difference in swelling.  He also has chronic right shoulder pain sore and has a difficulty with movement.  I do think there is some cellulitis going on we will start him with antibiotic.   ROS: Per HPI  Allergies  Allergen Reactions  . Morphine And Related Nausea And Vomiting   Past Medical History:  Diagnosis Date  . Anxiety   . Arthritis    left shoulder  . Atrial fibrillation ([place])   . Cancer ([place]) 1989   thyroid  . Cellulitis   . Chronic heart failure with preserved ejection fraction ([place])   . Diabetes mellitus without complication ([place])   . Hemorrhoids    external  . Hx of colonic polyps   . Hyperlipidemia   . Hypertension   . Migraine   . Papillary thyroid carcinoma ([place])   . Postoperative hypothyroidism   . Primary osteoarthritis of left shoulder   . Sleep apnea   . Venous stasis dermatitis of both lower extremities    No current facility-administered medications for this visit.  No current outpatient medications on file.  Facility-Administered Medications Ordered in Other Visits:  .  acetaminophen (TYLENOL) tablet 650 mg, 650 mg, Oral, Q6H PRN, 650 mg at 08/04/19 1831 **OR**  acetaminophen (TYLENOL) suppository 650 mg, 650 mg, Rectal, Q6H PRN, Jani Gravel, MD .  atorvastatin (LIPITOR) tablet 20 mg, 20 mg, Oral, Daily, Jani Gravel, MD, 20 mg at 08/05/19 1645 .  diltiazem (CARDIZEM CD) 24 hr capsule 360 mg, 360 mg, Oral, Daily, Jani Gravel, MD, 360 mg at 08/05/19 [phone]  doxycycline (VIBRAMYCIN) 100 mg in sodium chloride 0.9 % 250 mL IVPB, 100 mg, Intravenous, Q12H, Emokpae, Courage, MD, Last Rate: 125 mL/hr at 08/05/19 1643, 100 mg at 08/05/19 1643 .  furosemide (LASIX) tablet 80 mg, 80 mg, Oral, Daily, Jani Gravel, MD, 80 mg at 08/05/19 0844 .  gabapentin (NEURONTIN) capsule 100 mg, 100 mg, Oral, TID, Jani Gravel, MD, 100 mg at 08/05/19 2310 .  HYDROmorphone (DILAUDID) injection 1 mg, 1 mg, Intravenous, Q4H PRN, Jani Gravel, MD, 1 mg at 08/05/19 2331 .  insulin aspart (novoLOG) injection 0-5 Units, 0-5 Units, Subcutaneous, QHS, Jani Gravel, MD .  insulin aspart (novoLOG) injection 0-9 Units, 0-9 Units, Subcutaneous, TID WC, Jani Gravel, MD, 1 Units at 08/05/19 1216 .  irbesartan (AVAPRO) tablet 75 mg, 75 mg, Oral, Daily, Jani Gravel, MD, 75 mg at 08/05/19 0843 .  levothyroxine (SYNTHROID) tablet 200 mcg, 200 mcg, Oral, QAC breakfast, Jani Gravel, MD, 200 mcg at 08/05/19 [phone] .  levothyroxine (SYNTHROID) tablet 50 mcg, 50 mcg, Oral, QAC  breakfast, Jani Gravel, MD, 50 mcg at 08/05/19 0615 .  methocarbamol (ROBAXIN) tablet 750 mg, 750 mg, Oral, Q6H, Emokpae, Courage, MD, 750 mg at 08/05/19 2320 .  multivitamin with minerals tablet 1 tablet, 1 tablet, Oral, BID, Jani Gravel, MD, 1 tablet at 08/05/19 2310 .  ondansetron (ZOFRAN) injection 4 mg, 4 mg, Intravenous, Q6H PRN, Jani Gravel, MD, 4 mg at 08/04/19 0132 .  oxyCODONE (Oxy IR/ROXICODONE) immediate release tablet 5 mg, 5 mg, Oral, Q4H PRN, Denton Brick, Courage, MD, 5 mg at 08/05/19 1216 .  rivaroxaban (XARELTO) tablet 20 mg, 20 mg, Oral, Q supper, Franky Macho, RPH, 20 mg at 08/05/19 1645 .  senna-docusate (Senokot-S) tablet 2 tablet, 2  tablet, Oral, BID, Emokpae, Courage, MD, 2 tablet at 08/05/19 2309 .  spironolactone (ALDACTONE) tablet 25 mg, 25 mg, Oral, Daily, Jani Gravel, MD, 25 mg at 08/05/19 [id number]  Assessment/ Plan: 63 y.o. male   1. Fever, unspecified fever cause - predniSONE (STERAPRED UNI-PAK 48 TAB) 10 MG (48) TBPK tablet; Take as directed for 12 days (Patient taking differently: Take 1 tablet by mouth See admin instructions. Take as directed for 12 days, starting with 6 tablets on day 1, then as directed thereafter)  Dispense: 48 tablet; Refill: 0 - doxycycline (VIBRA-TABS) 100 MG tablet; Take 1 tablet (100 mg total) by mouth 2 (two) times daily. 1 po bid (Patient taking differently: Take 100 mg by mouth 2 (two) times daily. 10 day course starting on 08/03/2019)  Dispense: 20 tablet; Refill: 0  2. Chills - predniSONE (STERAPRED UNI-PAK 48 TAB) 10 MG (48) TBPK tablet; Take as directed for 12 days (Patient taking differently: Take 1 tablet by mouth See admin instructions. Take as directed for 12 days, starting with 6 tablets on day 1, then as directed thereafter)  Dispense: 48 tablet; Refill: 0 - doxycycline (VIBRA-TABS) 100 MG tablet; Take 1 tablet (100 mg total) by mouth 2 (two) times daily. 1 po bid (Patient taking differently: Take 100 mg by mouth 2 (two) times daily. 10 day course starting on 08/03/2019)  Dispense: 20 tablet; Refill: 0  3. Myalgia - predniSONE (STERAPRED UNI-PAK 48 TAB) 10 MG (48) TBPK tablet; Take as directed for 12 days (Patient taking differently: Take 1 tablet by mouth See admin instructions. Take as directed for 12 days, starting with 6 tablets on day 1, then as directed thereafter)  Dispense: 48 tablet; Refill: 0 - doxycycline (VIBRA-TABS) 100 MG tablet; Take 1 tablet (100 mg total) by mouth 2 (two) times daily. 1 po bid (Patient taking differently: Take 100 mg by mouth 2 (two) times daily. 10 day course starting on 08/03/2019)  Dispense: 20 tablet; Refill: 0    No follow-ups on file.   Continue all other maintenance medications as listed above.  Start time: 12:42 PM End time: 12:56 PM  Meds ordered this encounter  Medications  . predniSONE (STERAPRED UNI-PAK 48 TAB) 10 MG (48) TBPK tablet    Sig: Take as directed for 12 days    Dispense:  48 tablet    Refill:  0    Order Specific Question:   Supervising Provider    Answer:   Janora Norlander [id number]  . doxycycline (VIBRA-TABS) 100 MG tablet    Sig: Take 1 tablet (100 mg total) by mouth 2 (two) times daily. 1 po bid    Dispense:  20 tablet    Refill:  0    Order Specific Question:   Supervising Provider    Answer:  Janora Norlander [[id number]]    Particia Nearing PA-C [place] [phone]

## 2019-08-03 NOTE — ED Triage Notes (Signed)
Pt with right shoulder pain since waking up this morning.  Pt states with hx of arthritis.

## 2019-08-03 NOTE — ED Notes (Signed)
EKG done and patient on cardiac monitoring at this time 

## 2019-08-03 NOTE — ED Notes (Signed)
Chills and fever since last night.  Pt states his PCP started antibiotics for a "spider bite" and pt is not able to tell me or show the triage nurse of his "spider bite".

## 2019-08-04 ENCOUNTER — Observation Stay (HOSPITAL_COMMUNITY): Payer: BC Managed Care – PPO

## 2019-08-04 ENCOUNTER — Encounter (HOSPITAL_COMMUNITY): Payer: Self-pay | Admitting: Internal Medicine

## 2019-08-04 ENCOUNTER — Observation Stay (HOSPITAL_BASED_OUTPATIENT_CLINIC_OR_DEPARTMENT_OTHER): Payer: BC Managed Care – PPO

## 2019-08-04 DIAGNOSIS — F419 Anxiety disorder, unspecified: Secondary | ICD-10-CM | POA: Diagnosis present

## 2019-08-04 DIAGNOSIS — R918 Other nonspecific abnormal finding of lung field: Secondary | ICD-10-CM | POA: Diagnosis not present

## 2019-08-04 DIAGNOSIS — E890 Postprocedural hypothyroidism: Secondary | ICD-10-CM | POA: Diagnosis present

## 2019-08-04 DIAGNOSIS — M8580 Other specified disorders of bone density and structure, unspecified site: Secondary | ICD-10-CM | POA: Diagnosis present

## 2019-08-04 DIAGNOSIS — R3129 Other microscopic hematuria: Secondary | ICD-10-CM | POA: Diagnosis present

## 2019-08-04 DIAGNOSIS — Z20828 Contact with and (suspected) exposure to other viral communicable diseases: Secondary | ICD-10-CM | POA: Diagnosis present

## 2019-08-04 DIAGNOSIS — A419 Sepsis, unspecified organism: Secondary | ICD-10-CM | POA: Diagnosis present

## 2019-08-04 DIAGNOSIS — Z79899 Other long term (current) drug therapy: Secondary | ICD-10-CM | POA: Diagnosis not present

## 2019-08-04 DIAGNOSIS — R509 Fever, unspecified: Secondary | ICD-10-CM

## 2019-08-04 DIAGNOSIS — I361 Nonrheumatic tricuspid (valve) insufficiency: Secondary | ICD-10-CM

## 2019-08-04 DIAGNOSIS — M19012 Primary osteoarthritis, left shoulder: Secondary | ICD-10-CM | POA: Diagnosis present

## 2019-08-04 DIAGNOSIS — E1159 Type 2 diabetes mellitus with other circulatory complications: Secondary | ICD-10-CM | POA: Diagnosis not present

## 2019-08-04 DIAGNOSIS — M25511 Pain in right shoulder: Secondary | ICD-10-CM

## 2019-08-04 DIAGNOSIS — E1169 Type 2 diabetes mellitus with other specified complication: Secondary | ICD-10-CM | POA: Diagnosis present

## 2019-08-04 DIAGNOSIS — R000 Tachycardia, unspecified: Secondary | ICD-10-CM | POA: Diagnosis present

## 2019-08-04 DIAGNOSIS — G4733 Obstructive sleep apnea (adult) (pediatric): Secondary | ICD-10-CM | POA: Diagnosis present

## 2019-08-04 DIAGNOSIS — I5032 Chronic diastolic (congestive) heart failure: Secondary | ICD-10-CM | POA: Diagnosis present

## 2019-08-04 DIAGNOSIS — M353 Polymyalgia rheumatica: Secondary | ICD-10-CM | POA: Diagnosis present

## 2019-08-04 DIAGNOSIS — W57XXXA Bitten or stung by nonvenomous insect and other nonvenomous arthropods, initial encounter: Secondary | ICD-10-CM | POA: Diagnosis present

## 2019-08-04 DIAGNOSIS — I152 Hypertension secondary to endocrine disorders: Secondary | ICD-10-CM | POA: Diagnosis present

## 2019-08-04 DIAGNOSIS — Z6841 Body Mass Index (BMI) 40.0 and over, adult: Secondary | ICD-10-CM | POA: Diagnosis not present

## 2019-08-04 DIAGNOSIS — E119 Type 2 diabetes mellitus without complications: Secondary | ICD-10-CM

## 2019-08-04 DIAGNOSIS — G8929 Other chronic pain: Secondary | ICD-10-CM | POA: Diagnosis present

## 2019-08-04 DIAGNOSIS — E1141 Type 2 diabetes mellitus with diabetic mononeuropathy: Secondary | ICD-10-CM | POA: Diagnosis present

## 2019-08-04 DIAGNOSIS — G43909 Migraine, unspecified, not intractable, without status migrainosus: Secondary | ICD-10-CM | POA: Diagnosis present

## 2019-08-04 DIAGNOSIS — F40240 Claustrophobia: Secondary | ICD-10-CM | POA: Diagnosis present

## 2019-08-04 DIAGNOSIS — K8020 Calculus of gallbladder without cholecystitis without obstruction: Secondary | ICD-10-CM | POA: Diagnosis not present

## 2019-08-04 DIAGNOSIS — I4821 Permanent atrial fibrillation: Secondary | ICD-10-CM

## 2019-08-04 DIAGNOSIS — E785 Hyperlipidemia, unspecified: Secondary | ICD-10-CM | POA: Diagnosis present

## 2019-08-04 LAB — SEDIMENTATION RATE: Sed Rate: 35 mm/hr — ABNORMAL HIGH (ref 0–16)

## 2019-08-04 LAB — COMPREHENSIVE METABOLIC PANEL
ALT: 39 U/L (ref 0–44)
AST: 34 U/L (ref 15–41)
Albumin: 3.9 g/dL (ref 3.5–5.0)
Alkaline Phosphatase: 80 U/L (ref 38–126)
Anion gap: 12 (ref 5–15)
BUN: 28 mg/dL — ABNORMAL HIGH (ref 8–23)
CO2: 25 mmol/L (ref 22–32)
Calcium: 8.7 mg/dL — ABNORMAL LOW (ref 8.9–10.3)
Chloride: 100 mmol/L (ref 98–111)
Creatinine, Ser: 1.3 mg/dL — ABNORMAL HIGH (ref 0.61–1.24)
GFR calc Af Amer: >60 mL/min (ref >60)
GFR calc non Af Amer: 58 mL/min — ABNORMAL LOW (ref >60)
Glucose, Bld: 174 mg/dL — ABNORMAL HIGH (ref 70–99)
Potassium: 3.9 mmol/L (ref 3.5–5.1)
Sodium: 137 mmol/L (ref 135–145)
Total Bilirubin: 1.8 mg/dL — ABNORMAL HIGH (ref 0.3–1.2)
Total Protein: 8.1 g/dL (ref 6.5–8.1)

## 2019-08-04 LAB — GLUCOSE, CAPILLARY
Glucose-Capillary: 131 mg/dL — ABNORMAL HIGH (ref 70–99)
Glucose-Capillary: 143 mg/dL — ABNORMAL HIGH (ref 70–99)
Glucose-Capillary: 153 mg/dL — ABNORMAL HIGH (ref 70–99)
Glucose-Capillary: 194 mg/dL — ABNORMAL HIGH (ref 70–99)

## 2019-08-04 LAB — SARS CORONAVIRUS 2 (TAT 6-24 HRS): SARS Coronavirus 2: NEGATIVE

## 2019-08-04 LAB — ECHOCARDIOGRAM COMPLETE
Height: 75 in
Weight: 6416 oz

## 2019-08-04 LAB — LACTIC ACID, PLASMA: Lactic Acid, Venous: 2.5 mmol/L (ref 0.5–1.9)

## 2019-08-04 LAB — CBC
HCT: 39 % (ref 39.0–52.0)
Hemoglobin: 12.6 g/dL — ABNORMAL LOW (ref 13.0–17.0)
MCH: 29 pg (ref 26.0–34.0)
MCHC: 32.3 g/dL (ref 30.0–36.0)
MCV: 89.9 fL (ref 80.0–100.0)
Platelets: 193 10*3/uL (ref 150–400)
RBC: 4.34 MIL/uL (ref 4.22–5.81)
RDW: 13.4 % (ref 11.5–15.5)
WBC: 15.9 10*3/uL — ABNORMAL HIGH (ref 4.0–10.5)
nRBC: 0 % (ref 0.0–0.2)

## 2019-08-04 LAB — HIV ANTIBODY (ROUTINE TESTING W REFLEX): HIV Screen 4th Generation wRfx: NONREACTIVE

## 2019-08-04 LAB — C-REACTIVE PROTEIN: CRP: 18.4 mg/dL — ABNORMAL HIGH (ref <1.0)

## 2019-08-04 MED ORDER — SENNOSIDES-DOCUSATE SODIUM 8.6-50 MG PO TABS
2.0000 | ORAL_TABLET | Freq: Two times a day (BID) | ORAL | Status: DC
  Administered 2019-08-05 – 2019-08-06 (×3): 2 via ORAL
  Filled 2019-08-04 (×9): qty 2

## 2019-08-04 MED ORDER — IRBESARTAN 75 MG PO TABS
75.0000 mg | ORAL_TABLET | Freq: Every day | ORAL | Status: DC
  Administered 2019-08-05 – 2019-08-07 (×3): 75 mg via ORAL
  Filled 2019-08-04 (×6): qty 1

## 2019-08-04 MED ORDER — VANCOMYCIN HCL IN DEXTROSE 1-5 GM/200ML-% IV SOLN: 1000.0000 mg | INTRAVENOUS | Status: DC

## 2019-08-04 MED ORDER — VANCOMYCIN HCL 10 G IV SOLR
2500.0000 mg | Freq: Once | INTRAVENOUS | Status: AC
  Administered 2019-08-04: 2500 mg via INTRAVENOUS
  Filled 2019-08-04: qty 2000

## 2019-08-04 MED ORDER — SODIUM CHLORIDE 0.9 % IV SOLN
INTRAVENOUS | Status: AC
  Administered 2019-08-04: 02:00:00 via INTRAVENOUS

## 2019-08-04 MED ORDER — ACETAMINOPHEN 325 MG PO TABS
650.0000 mg | ORAL_TABLET | Freq: Four times a day (QID) | ORAL | Status: DC | PRN
  Administered 2019-08-04: 650 mg via ORAL
  Filled 2019-08-04: qty 2

## 2019-08-04 MED ORDER — FUROSEMIDE 80 MG PO TABS
80.0000 mg | ORAL_TABLET | Freq: Every day | ORAL | Status: DC
  Administered 2019-08-04 – 2019-08-06 (×3): 80 mg via ORAL
  Filled 2019-08-04 (×2): qty 1
  Filled 2019-08-04: qty 2
  Filled 2019-08-04: qty 1

## 2019-08-04 MED ORDER — LACTULOSE 10 GM/15ML PO SOLN
30.0000 g | Freq: Once | ORAL | Status: AC
  Administered 2019-08-05: 30 g via ORAL
  Filled 2019-08-04: qty 60

## 2019-08-04 MED ORDER — LEVOTHYROXINE SODIUM 50 MCG PO TABS
50.0000 ug | ORAL_TABLET | Freq: Every day | ORAL | Status: DC
  Administered 2019-08-05 – 2019-08-07 (×3): 50 ug via ORAL
  Filled 2019-08-04 (×3): qty 1

## 2019-08-04 MED ORDER — ACETAMINOPHEN 650 MG RE SUPP: 650.0000 mg | Freq: Four times a day (QID) | RECTAL | Status: DC | PRN

## 2019-08-04 MED ORDER — GABAPENTIN 100 MG PO CAPS
100.0000 mg | ORAL_CAPSULE | Freq: Three times a day (TID) | ORAL | Status: DC
  Administered 2019-08-04 – 2019-08-07 (×9): 100 mg via ORAL
  Filled 2019-08-04 (×9): qty 1

## 2019-08-04 MED ORDER — INSULIN ASPART 100 UNIT/ML ~~LOC~~ SOLN
0.0000 [IU] | Freq: Every day | SUBCUTANEOUS | Status: DC
  Filled 2019-08-04: qty 1

## 2019-08-04 MED ORDER — SODIUM CHLORIDE 0.9 % IV SOLN
100.0000 mg | Freq: Two times a day (BID) | INTRAVENOUS | Status: DC
  Administered 2019-08-05 – 2019-08-07 (×5): 100 mg via INTRAVENOUS
  Filled 2019-08-04 (×9): qty 100

## 2019-08-04 MED ORDER — PIPERACILLIN-TAZOBACTAM 3.375 G IVPB
3.3750 g | Freq: Three times a day (TID) | INTRAVENOUS | Status: DC
  Administered 2019-08-04 (×2): 3.375 g via INTRAVENOUS
  Filled 2019-08-04 (×2): qty 50

## 2019-08-04 MED ORDER — INSULIN ASPART 100 UNIT/ML ~~LOC~~ SOLN
0.0000 [IU] | Freq: Three times a day (TID) | SUBCUTANEOUS | Status: DC
  Administered 2019-08-04: 12:00:00 2 [IU] via SUBCUTANEOUS
  Administered 2019-08-04 – 2019-08-06 (×5): 1 [IU] via SUBCUTANEOUS
  Administered 2019-08-07: 2 [IU] via SUBCUTANEOUS
  Filled 2019-08-04: qty 1

## 2019-08-04 MED ORDER — VANCOMYCIN HCL 1000 MG IV SOLR
INTRAVENOUS | Status: AC
  Filled 2019-08-04: qty 2000

## 2019-08-04 MED ORDER — LEVOTHYROXINE SODIUM 100 MCG PO TABS
200.0000 ug | ORAL_TABLET | Freq: Every day | ORAL | Status: DC
  Administered 2019-08-05 – 2019-08-07 (×3): 200 ug via ORAL
  Filled 2019-08-04 (×3): qty 2

## 2019-08-04 MED ORDER — SPIRONOLACTONE 25 MG PO TABS
25.0000 mg | ORAL_TABLET | Freq: Every day | ORAL | Status: DC
  Administered 2019-08-04 – 2019-08-06 (×3): 25 mg via ORAL
  Filled 2019-08-04 (×7): qty 1

## 2019-08-04 MED ORDER — OXYCODONE HCL 5 MG PO TABS
5.0000 mg | ORAL_TABLET | ORAL | Status: DC | PRN
  Administered 2019-08-04 – 2019-08-07 (×6): 5 mg via ORAL
  Filled 2019-08-04 (×7): qty 1

## 2019-08-04 MED ORDER — VANCOMYCIN HCL 500 MG IV SOLR
INTRAVENOUS | Status: AC
  Filled 2019-08-04: qty 500

## 2019-08-04 MED ORDER — HYDROMORPHONE HCL 1 MG/ML IJ SOLN
1.0000 mg | INTRAMUSCULAR | Status: DC | PRN
  Administered 2019-08-04 – 2019-08-05 (×6): 1 mg via INTRAVENOUS
  Filled 2019-08-04 (×6): qty 1

## 2019-08-04 MED ORDER — DILTIAZEM HCL ER COATED BEADS 180 MG PO CP24
360.0000 mg | ORAL_CAPSULE | Freq: Every day | ORAL | Status: DC
  Administered 2019-08-04 – 2019-08-07 (×4): 360 mg via ORAL
  Filled 2019-08-04: qty 2
  Filled 2019-08-04: qty 1
  Filled 2019-08-04 (×3): qty 2
  Filled 2019-08-04 (×2): qty 1

## 2019-08-04 MED ORDER — ONDANSETRON HCL 4 MG/2ML IJ SOLN
4.0000 mg | Freq: Four times a day (QID) | INTRAMUSCULAR | Status: DC | PRN
  Administered 2019-08-04: 4 mg via INTRAVENOUS
  Filled 2019-08-04: qty 2

## 2019-08-04 MED ORDER — VANCOMYCIN HCL 1.25 G IV SOLR
1250.0000 mg | Freq: Two times a day (BID) | INTRAVENOUS | Status: DC
  Administered 2019-08-04: 1250 mg via INTRAVENOUS
  Filled 2019-08-04 (×5): qty 1250

## 2019-08-04 MED ORDER — VANCOMYCIN HCL 500 MG IV SOLR
500.0000 mg | Freq: Once | INTRAVENOUS | Status: DC
  Filled 2019-08-04: qty 500

## 2019-08-04 MED ORDER — RIVAROXABAN 20 MG PO TABS
20.0000 mg | ORAL_TABLET | Freq: Every day | ORAL | Status: DC
  Administered 2019-08-05 – 2019-08-06 (×2): 20 mg via ORAL
  Filled 2019-08-04 (×4): qty 1

## 2019-08-04 MED ORDER — VANCOMYCIN HCL 10 G IV SOLR
2500.0000 mg | INTRAVENOUS | Status: DC
  Filled 2019-08-04 (×2): qty 2500

## 2019-08-04 MED ORDER — ADULT MULTIVITAMIN W/MINERALS CH
1.0000 | ORAL_TABLET | Freq: Two times a day (BID) | ORAL | Status: DC
  Administered 2019-08-04 – 2019-08-07 (×6): 1 via ORAL
  Filled 2019-08-04 (×6): qty 1

## 2019-08-04 MED ORDER — ATORVASTATIN CALCIUM 20 MG PO TABS
20.0000 mg | ORAL_TABLET | Freq: Every day | ORAL | Status: DC
  Administered 2019-08-04 – 2019-08-06 (×3): 20 mg via ORAL
  Filled 2019-08-04 (×2): qty 2
  Filled 2019-08-04 (×3): qty 1

## 2019-08-04 NOTE — Progress Notes (Addendum)
Pharmacy Antibiotic Note  Richard Crawford is a 63 y.o. male admitted on 08/03/2019 with sepsis.  Pharmacy has been consulted for Vancomycin dosing. Pt also to receive Rocephin in ED.  Plan: Vancomycin 2500mg  IV q24h. Goal AUC 400-550. Expected AUC: 473 SCr used: 1.38 with Vd 0.5 (pt with BMI 50) Will f/u renal function, micro data, and pt's clinical condition Vanc levels prn F/u Gram negative coverage upon admission  Addendum (0250) Pharmacy consulted to start Zosyn  Plan: Zosyn 3.375gm IV q8h - each dose over 4 hours   Height: 6\' 3"  (190.5 cm) Weight: (!) 401 lb (181.9 kg) IBW/kg (Calculated) : 84.5  Temp (24hrs), Avg:102 F (38.9 C), Min:101.5 F (38.6 C), Max:102.5 F (39.2 C)  Recent Labs  Lab 08/03/19 2245  WBC 15.8*  CREATININE 1.38*  LATICACIDVEN 2.0*    Estimated Creatinine Clearance: 94.5 mL/min (A) (by C-G formula based on SCr of 1.38 mg/dL (H)).    Allergies  Allergen Reactions  . Morphine And Related Nausea And Vomiting    Antimicrobials this admission: 10/24 Vanc >>  10/24 Rocephin  Microbiology results: 10/23 BCx:  10/23 UCx:  10/23: SARS coronavirus 2:   Thank you for allowing pharmacy to be a part of this patient's care.  Sherlon Handing, PharmD, BCPS 08/04/2019 12:03 AM

## 2019-08-04 NOTE — ED Notes (Signed)
Date and time results received: 08/04/19 0056  Test: Lactic Critical Value: 2.5  Name of Provider Notified: Maudie Mercury, MD

## 2019-08-04 NOTE — ED Notes (Signed)
Called [facility] to cancel transport of Pt to [facility].  Pt staying at [facility].

## 2019-08-04 NOTE — ED Notes (Signed)
Out of bed to [facility] with assistance from spouse

## 2019-08-04 NOTE — ED Notes (Signed)
Lunch provided.

## 2019-08-04 NOTE — ED Notes (Signed)
Spouse at bedside  Pt is informed that bed has been requested at [facility]

## 2019-08-04 NOTE — H&P (Addendum)
TRH H&P    Patient Demographics:    Richard Crawford, is a 63 y.o. male  MRN: [id number]  DOB - 1955/10/30  Admit Date - 08/03/2019  Referring MD/NP/PA:  [place]  Outpatient Primary MD for the patient is Rakes, Connye Burkitt, FNP  Patient coming from:  home  Chief complaint-  fever   HPI:    Richard Crawford  is a 63 y.o. male, w h/o papillary thyroid carcinoma s/p resection, hypothyroidism, hypertension, hyperlipidemia, Dm2, Pafib, CHF, OSA, presents with c/o right shoulder pain, and noted to have fever in ED.  Pt states he was bitten by an insect, or tick yesterday. Or spider.   In ED,  T 101.5 P 118, R 22, Bp 133/77 pox 99% on RA Wt 181.9kg  CXR IMPRESSION: No active disease.  R shoulder FINDINGS: No acute fracture is seen. The patient has had a prior humeral head arthroplasty. There is diffuse osteopenia. No periprosthetic lucency is seen. No significant overlying soft tissue swelling.  IMPRESSION: No acute osseous abnormality.   Urinalysis negative Wbc 15.8, Hgb 12.8, Ptl 227 Na 132, K 3.6, Bun 26, Creatinine 1.38 Glucose 221 Hco3 21, AG 14 Lactic 2.0,   2.5  Crp 18.4  Pt will be admitted for fever, unclear source.     Review of systems:    In addition to the HPI above,    No Headache, No changes with Vision or hearing, No problems swallowing food or Liquids, No Chest pain, Cough or Shortness of Breath, No Abdominal pain, No Nausea or Vomiting, bowel movements are regular, No Blood in stool or Urine, No dysuria, No new skin rashes or bruises,   No new weakness, tingling, numbness in any extremity, No recent weight gain or loss, No polyuria, polydypsia or polyphagia, No significant Mental Stressors.  All other systems reviewed and are negative.    Past History of the following :    Past Medical History:  Diagnosis Date  . Anxiety   . Arthritis    left shoulder  .  Atrial fibrillation ([place])   . Cancer ([place]) 1989   thyroid  . Cellulitis   . Chronic heart failure with preserved ejection fraction ([place])   . Diabetes mellitus without complication ([place])   . Hemorrhoids    external  . Hx of colonic polyps   . Hyperlipidemia   . Hypertension   . Migraine   . Papillary thyroid carcinoma ([place])   . Postoperative hypothyroidism   . Primary osteoarthritis of left shoulder   . Sleep apnea   . Venous stasis dermatitis of both lower extremities       Past Surgical History:  Procedure Laterality Date  . ANKLE FUSION    . KNEE ARTHROSCOPY    . SHOULDER ARTHROSCOPY    . TOTAL SHOULDER REPLACEMENT Right       Social History:      Social History   Tobacco Use  . Smoking status: Never Smoker  . Smokeless tobacco: Never Used  Substance Use Topics  . Alcohol use: Not Currently  Family History :     Family History  Problem Relation Age of Onset  . Cancer Mother        breast, stomach, ovarian  . Hypertension Mother   . Dementia Mother   . Heart disease Father        Home Medications:   Prior to Admission medications   Medication Sig Start Date End Date Taking? Authorizing Provider  atorvastatin (LIPITOR) 20 MG tablet Take 1 tablet (20 mg total) by mouth daily. 02/22/19   Baruch Gouty, FNP  celecoxib (CELEBREX) 200 MG capsule TAKE 1 CAPSULE (200 MG TOTAL) BY MOUTH DAILY FOR 30 DAYS. 07/16/19 08/15/19  Baruch Gouty, FNP  diltiazem (CARDIZEM CD) 360 MG 24 hr capsule Take 1 capsule (360 mg total) by mouth daily. 03/07/19   Minus Breeding, MD  doxycycline (VIBRA-TABS) 100 MG tablet Take 1 tablet (100 mg total) by mouth 2 (two) times daily. 1 po bid 08/03/19   Terald Sleeper, PA-C  furosemide (LASIX) 80 MG tablet Take 1 tablet (80 mg total) by mouth daily for 30 days. 03/20/19 04/19/19  Baruch Gouty, FNP  gabapentin (NEURONTIN) 100 MG capsule Take 1 capsule (100 mg total) by mouth 3 (three) times daily. 06/25/19   Baruch Gouty, FNP   levothyroxine (SYNTHROID) 200 MCG tablet Take 1 tablet (200 mcg total) by mouth daily before breakfast. 05/30/19   Nida, Marella Chimes, MD  levothyroxine (SYNTHROID) 50 MCG tablet Take 1 tablet (50 mcg total) by mouth daily before breakfast. 05/30/19   Nida, Marella Chimes, MD  metFORMIN (GLUCOPHAGE-XR) 750 MG 24 hr tablet TAKE 1 TABLET BY MOUTH EVERY DAY WITH BREAKFAST 06/05/19   Baruch Gouty, FNP  Multiple Vitamin (MULTIVITAMIN) tablet Take 1 tablet by mouth 2 (two) times daily.    [provider]  predniSONE (STERAPRED UNI-PAK 48 TAB) 10 MG (48) TBPK tablet Take as directed for 12 days 08/03/19   Terald Sleeper, PA-C  spironolactone (ALDACTONE) 25 MG tablet Take 1 tablet (25 mg total) by mouth daily for 30 days. 03/20/19 04/19/19  Baruch Gouty, FNP  valsartan (DIOVAN) 80 MG tablet Take 1 tablet (80 mg total) by mouth daily. 06/13/19   Rakes, Connye Burkitt, FNP  XARELTO 20 MG TABS tablet TAKE 1 TABLET (20 MG TOTAL) BY MOUTH DAILY WITH SUPPER FOR 30 DAYS. 04/30/19   Baruch Gouty, FNP     Allergies:     Allergies  Allergen Reactions  . Morphine And Related Nausea And Vomiting     Physical Exam:   Vitals  Blood pressure 134/83, pulse 85, temperature 99 F (37.2 C), temperature source Oral, resp. rate 18, height '6\' 3"'$  (1.905 m), weight (!) 181.9 kg, SpO2 94 %.  1.  General: axoxo3  2. Psychiatric: euthymic  3. Neurologic: cn2-12 intact, reflexes 2+ symmetric, diffuse with no clonus, motor 5/5 in all 4 ext  4. HEENMT:  Anicteric, pupils 1.34m symmetric, direct, consensual, near intact Neck: no jvd  5. Respiratory : CTAB  6. Cardiovascular : Irr, irr, s1, s2, no m/g/r  7. Gastrointestinal:  Abd: soft, morbidly obese, nt, nd, +bs  8. Skin:  Ext: no c/c/e, + tatoos,  Slight warmth over the anterior aspect of the right shoulder, scar over ant aspect of right shoulder,  R TKA scar.  Multiple tatoos  9.Musculoskeletal:  Pt unable to move right shoulder, ? Due to pain      Data Review:    CBC Recent Labs  Lab 08/03/19  2245  WBC 15.8*  HGB 12.8*  HCT 38.7*  PLT 227  MCV 87.6  MCH 29.0  MCHC 33.1  RDW 13.2  LYMPHSABS 1.2  MONOABS 1.8*  EOSABS 0.1  BASOSABS 0.1   ------------------------------------------------------------------------------------------------------------------  Results for orders placed or performed during the hospital encounter of 08/03/19 (from the past 48 hour(s))  Urinalysis, Routine w reflex microscopic     Status: Abnormal   Collection Time: 08/03/19  9:55 PM  Result Value Ref Range   Color, Urine YELLOW YELLOW   APPearance CLEAR CLEAR   Specific Gravity, Urine 1.015 1.005 - 1.030   pH 7.0 5.0 - 8.0   Glucose, UA NEGATIVE NEGATIVE mg/dL   Hgb urine dipstick MODERATE (A) NEGATIVE   Bilirubin Urine NEGATIVE NEGATIVE   Ketones, ur NEGATIVE NEGATIVE mg/dL   Protein, ur NEGATIVE NEGATIVE mg/dL   Nitrite NEGATIVE NEGATIVE   Leukocytes,Ua NEGATIVE NEGATIVE   RBC / HPF 11-20 0 - 5 RBC/hpf   WBC, UA 0-5 0 - 5 WBC/hpf   Bacteria, UA NONE SEEN NONE SEEN    Comment: Performed at [facility], [address] [place], [place] [address]  CBC with Differential     Status: Abnormal   Collection Time: 08/03/19 10:45 PM  Result Value Ref Range   WBC 15.8 (H) 4.0 - 10.5 K/uL   RBC 4.42 4.22 - 5.81 MIL/uL   Hemoglobin 12.8 (L) 13.0 - 17.0 g/dL   HCT 38.7 (L) 39.0 - 52.0 %   MCV 87.6 80.0 - 100.0 fL   MCH 29.0 26.0 - 34.0 pg   MCHC 33.1 30.0 - 36.0 g/dL   RDW 13.2 11.5 - 15.5 %   Platelets 227 150 - 400 K/uL   nRBC 0.0 0.0 - 0.2 %   Neutrophils Relative % 78 %   Neutro Abs 12.5 (H) 1.7 - 7.7 K/uL   Lymphocytes Relative 8 %   Lymphs Abs 1.2 0.7 - 4.0 K/uL   Monocytes Relative 12 %   Monocytes Absolute 1.8 (H) 0.1 - 1.0 K/uL   Eosinophils Relative 1 %   Eosinophils Absolute 0.1 0.0 - 0.5 K/uL   Basophils Relative 0 %   Basophils Absolute 0.1 0.0 - 0.1 K/uL   Immature Granulocytes 1 %   Abs Immature Granulocytes 0.08 (H)  0.00 - 0.07 K/uL    Comment: Performed at [facility], [address], [place], [place] [address]  Basic metabolic panel     Status: Abnormal   Collection Time: 08/03/19 10:45 PM  Result Value Ref Range   Sodium 132 (L) 135 - 145 mmol/L   Potassium 3.6 3.5 - 5.1 mmol/L   Chloride 97 (L) 98 - 111 mmol/L   CO2 21 (L) 22 - 32 mmol/L   Glucose, Bld 221 (H) 70 - 99 mg/dL   BUN 26 (H) 8 - 23 mg/dL   Creatinine, Ser 1.38 (H) 0.61 - 1.24 mg/dL   Calcium 9.3 8.9 - 10.3 mg/dL   GFR calc non Af Amer 54 (L) >60 mL/min   GFR calc Af Amer >60 >60 mL/min   Anion gap 14 5 - 15    Comment: Performed at [facility], [address], [place], [place] [address]  Lactic acid, plasma     Status: Abnormal   Collection Time: 08/03/19 10:45 PM  Result Value Ref Range   Lactic Acid, Venous 2.0 (HH) 0.5 - 1.9 mmol/L    Comment: CRITICAL RESULT CALLED TO, READ BACK BY AND VERIFIED WITH: Sharyon Cable @ 2336  ON 08/03/19 BY JUW Performed at [facility], [address], [place], [place] [address]   Blood culture (routine x 2)     Status: None (Preliminary result)   Collection Time: 08/03/19 10:45 PM   Specimen: Right Antecubital; Blood  Result Value Ref Range   Specimen Description RIGHT ANTECUBITAL    Special Requests      BOTTLES DRAWN AEROBIC AND ANAEROBIC Blood Culture adequate volume Performed at [facility], [address], [place], [place] [address]    Culture PENDING    Report Status PENDING   Lactic acid, plasma     Status: Abnormal   Collection Time: 08/03/19 11:54 PM  Result Value Ref Range   Lactic Acid, Venous 2.5 (HH) 0.5 - 1.9 mmol/L    Comment: CRITICAL RESULT CALLED TO, READ BACK BY AND VERIFIED WITH: SAPPLET,J @ 0057 ON 08/04/19 BY JUW Performed at [facility], [address], [place], [place] [address]   Blood culture (routine x 2)     Status: None (Preliminary result)   Collection Time: 08/03/19 11:54 PM   Specimen: BLOOD LEFT FOREARM  Result Value Ref Range   Specimen Description  BLOOD LEFT FOREARM    Special Requests      BOTTLES DRAWN AEROBIC AND ANAEROBIC Blood Culture adequate volume Performed at [facility], [address], [place], [place] [address]    Culture PENDING    Report Status PENDING   Sedimentation rate     Status: Abnormal   Collection Time: 08/03/19 11:54 PM  Result Value Ref Range   Sed Rate 35 (H) 0 - 16 mm/hr    Comment: Performed at [facility], [address] [place], [place] [address]  C-reactive protein     Status: Abnormal   Collection Time: 08/03/19 11:54 PM  Result Value Ref Range   CRP 18.4 (H) <1.0 mg/dL    Comment: Performed at [facility], [address] [place], [place] [address]  Glucose, capillary     Status: Abnormal   Collection Time: 08/04/19 12:40 AM  Result Value Ref Range   Glucose-Capillary 194 (H) 70 - 99 mg/dL    Chemistries  Recent Labs  Lab 08/03/19 2245  NA 132*  K 3.6  CL 97*  CO2 21*  GLUCOSE 221*  BUN 26*  CREATININE 1.38*  CALCIUM 9.3   ------------------------------------------------------------------------------------------------------------------  ------------------------------------------------------------------------------------------------------------------ GFR: Estimated Creatinine Clearance: 94.5 mL/min (A) (by C-G formula based on SCr of 1.38 mg/dL (H)). Liver Function Tests: No results for input(s): AST, ALT, ALKPHOS, BILITOT, PROT, ALBUMIN in the last 168 hours. No results for input(s): LIPASE, AMYLASE in the last 168 hours. No results for input(s): AMMONIA in the last 168 hours. Coagulation Profile: No results for input(s): INR, PROTIME in the last 168 hours. Cardiac Enzymes: No results for input(s): CKTOTAL, CKMB, CKMBINDEX, TROPONINI in the last 168 hours. BNP (last 3 results) No results for input(s): PROBNP in the last 8760 hours. HbA1C: No results for input(s): HGBA1C in the last 72 hours. CBG: Recent Labs  Lab 08/04/19 0040  GLUCAP 194*   Lipid Profile: No results  for input(s): CHOL, HDL, LDLCALC, TRIG, CHOLHDL, LDLDIRECT in the last 72 hours. Thyroid Function Tests: No results for input(s): TSH, T4TOTAL, FREET4, T3FREE, THYROIDAB in the last 72 hours. Anemia Panel: No results for input(s): VITAMINB12, FOLATE, FERRITIN, TIBC, IRON, RETICCTPCT in the last 72 hours.  --------------------------------------------------------------------------------------------------------------- Urine analysis:    Component Value Date/Time   COLORURINE YELLOW 08/03/2019 2155   APPEARANCEUR CLEAR 08/03/2019 2155  LABSPEC 1.015 08/03/2019 2155   PHURINE 7.0 08/03/2019 2155   GLUCOSEU NEGATIVE 08/03/2019 2155   HGBUR MODERATE (A) 08/03/2019 2155   BILIRUBINUR NEGATIVE 08/03/2019 2155   KETONESUR NEGATIVE 08/03/2019 2155   PROTEINUR NEGATIVE 08/03/2019 2155   NITRITE NEGATIVE 08/03/2019 2155   LEUKOCYTESUR NEGATIVE 08/03/2019 2155      Imaging Results:    Dg Shoulder Right  Result Date: 08/03/2019 CLINICAL DATA:  Pain in the right shoulder EXAM: RIGHT SHOULDER - 2+ VIEW COMPARISON:  None. FINDINGS: No acute fracture is seen. The patient has had a prior humeral head arthroplasty. There is diffuse osteopenia. No periprosthetic lucency is seen. No significant overlying soft tissue swelling. IMPRESSION: No acute osseous abnormality. Electronically Signed   By: Prudencio Pair M.D.   On: 08/03/2019 21:41   Dg Chest Portable 1 View  Result Date: 08/03/2019 CLINICAL DATA:  63 year old male with fever and chills. EXAM: PORTABLE CHEST 1 VIEW COMPARISON:  None FINDINGS: Mild cardiomegaly. There is no focal consolidation, pleural effusion or pneumothorax. No acute osseous pathology. IMPRESSION: No active disease. Electronically Signed   By: Anner Crete M.D.   On: 08/03/2019 23:40    Afib at 109   Assessment & Plan:    Principal Problem:   Fever Active Problems:   Hypertension associated with diabetes ([place])   Hyperlipidemia associated with type 2 diabetes mellitus  ([place])   Type 2 diabetes mellitus without complication, without long-term current use of insulin (HCC)   Permanent atrial fibrillation (HCC)   Chronic diastolic HF (heart failure) (HCC)   Diabetic mononeuropathy associated with type 2 diabetes mellitus (HCC)   Right shoulder pain  Fever , unclear source Sepsis (Fever, Tachycardia, Leukocytosis, rr=22) Covid -19 pending Check lyme  Check blood culture x2 Check ESR Check CT chest/ abd/ pelvis Check cardiac echo  vanco iv, zosyn iv pharmacy to dose  R shoulder pain Pt states claustrophobic, declines MRI currently, please ask again in am Dilaudid '1mg'$  iv q4h prn  Zofran '4mg'$  iv q6h prn nausea Please consult orthopedics in AM  Hypertension/ Chronic CHF  Cont Diovan '80mg'$  po qday Cont Aldactone '25mg'$  po qday Cont Lasix '80mg'$  po qday  Pafib Cont Cardizem CD '360mg'$  po qday Cont Xarelto '20mg'$  po qday  Hyperlipidemia Cont Lipitor '20mg'$  po qhs  Hypothyroidism Cont Levothyroxine 250 micrograms po qday  Dm2 DC Metformin due to hx of CHF fsbs ac and qhs, ISS  Microscopic hematuria Check CT abd/ pelvis    DVT Prophylaxis-  Xarelto SCDs   AM Labs Ordered, also please review Full Orders  Family Communication: Admission, patients condition and plan of care including tests being ordered have been discussed with the patient  who indicate understanding and agree with the plan and Code Status.  Code Status:  FULL CODE per patient, notified wife that patient will be admitted to [facility]  Admission status:   Observation: Based on patients clinical presentation and evaluation of above clinical data, I have made determination that patient meets observation criteria at this time.     Time spent in minutes : 70 minutes    Jani Gravel M.D on 08/04/2019 at 2:29 AM

## 2019-08-04 NOTE — ED Notes (Signed)
Lunch provided  Pt reports great discomfort due to bed   He is morbidly obese and asks for chair to sit on

## 2019-08-04 NOTE — Progress Notes (Addendum)
  Patient seen and evaluated, chart reviewed, please see EMR for updated orders. Please see full H&P dictated by admitting physician Dr. Maudie Mercury for same date of service.   Brief summary 63 year old male with PMHx of DM, morbid obesity/OSA, Afib with chronic anticoagulation, h/o papillary thyroid carcinoma s/p resection with hypothyroidism and chronic pain/arthralgias admitted on 08/04/2019 with fevers and arthralgias -Patient apparently had a insect bite on 08/02/2019--is concerned it may have been a tick, but also states he could have been a spider, he  is not sure  -- No rash noted  A/p 1)Sepsis--patient met sepsis criteria on admission with a temp of 102.5,  heart rate up to 118 respiratory rate up to 24, leukocytosis with white count of 15.9-, lactic acid was elevated at 2.5 -Patient received IV Zosyn and vancomycin in the ED pending blood and urine culture results --Patient with myalgias, arthralgias and fevers -No HAs, no visual disturbance, no neck pain or stiffness -Given significant arthralgias and concerns about possible tick exposure treat empirically with IV doxycycline pending further lab data -RMSF, Ehrlichia and Lyme antibodies pending -Please note that patient's PCP started him on doxycycline and prednisone a couple of days PTA--- ??  Steroid therapy explain some of patient's leukocytosis -Echo without vegetations -CT chest, abdomen and pelvis without evidence of infection   2)Rt shoulder pain and stiffness--patient was concerned about possible tick bite-status post previous right shoulder surgery -MRI of the right shoulder recommended patient declines -ESR is 35, CRP is 18.4 -Patient with myalgias, arthralgias and fevers -No headaches,  -IV doxycycline as above  3) permanent  atrial fibrillation--echo from 08/04/2019 with EF of 60 to 65%, no vegetations or significant valvular concerns -Continue Xarelto for anticoagulation and Cardizem for rate  control  4)Hypothyroidism--stable continue levothyroxine 250 mcg daily  5) morbid obesity/OSA--- CPAP nightly advised

## 2019-08-04 NOTE — ED Notes (Signed)
Sitting on side of bed   Reports discomfort due to [facility] bed has been requested earlier from [facility]

## 2019-08-04 NOTE — ED Notes (Signed)
Call to [facility] re: meds

## 2019-08-04 NOTE — Progress Notes (Signed)
ANTICOAGULATION CONSULT NOTE - Initial Consult  Pharmacy Consult for Xarelto Indication: atrial fibrillation  Allergies  Allergen Reactions  . Morphine And Related Nausea And Vomiting    Patient Measurements: Height: 6\' 3"  (190.5 cm) Weight: (!) 401 lb (181.9 kg) IBW/kg (Calculated) : 84.5  Vital Signs: Temp: 99 F (37.2 C) (10/24 0046) Temp Source: Oral (10/24 0046) BP: 134/83 (10/24 0200) Pulse Rate: 85 (10/24 0200)  Labs: Recent Labs    08/03/19 2245  HGB 12.8*  HCT 38.7*  PLT 227  CREATININE 1.38*    Estimated Creatinine Clearance: 94.5 mL/min (A) (by C-G formula based on SCr of 1.38 mg/dL (H)).   Medical History: Past Medical History:  Diagnosis Date  . Anxiety   . Arthritis    left shoulder  . Atrial fibrillation ([place])   . Cancer ([place]) 1989   thyroid  . Cellulitis   . Chronic heart failure with preserved ejection fraction ([place])   . Diabetes mellitus without complication ([place])   . Hemorrhoids    external  . Hx of colonic polyps   . Hyperlipidemia   . Hypertension   . Migraine   . Papillary thyroid carcinoma ([place])   . Postoperative hypothyroidism   . Primary osteoarthritis of left shoulder   . Sleep apnea   . Venous stasis dermatitis of both lower extremities     Medications:  See electronic med rec  Assessment: 63 y.o. M presents with fever, sepsis. Pt on Xarelto 20mg  daily PTA for afib. Pharmacy consulted to dose while inpatient. CBC ok on admission.  Goal of Therapy:  Prevention of CVA Monitor platelets by anticoagulation protocol: Yes   Plan:  Xarelto 20mg  daily Will f/u renal function and CBC  Sherlon Handing, PharmD, BCPS [facility] Clinical pharmacist phone [phone] 08/04/2019,2:54 AM

## 2019-08-04 NOTE — ED Notes (Signed)
Meal provided 

## 2019-08-04 NOTE — ED Notes (Signed)
When attempting to open Diluadid for Pt to administer via IV, the IV medication came apart and spilled on Pt. Another RN retrieved new dose of medication from Pyxis for this Pt.

## 2019-08-04 NOTE — Progress Notes (Signed)
*  PRELIMINARY RESULTS* Echocardiogram 2D Echocardiogram has been performed.  Richard Crawford 08/04/2019, 9:54 AM

## 2019-08-04 NOTE — ED Notes (Signed)
Pt informed that wife in enroute to visit  He continues to sit in chair due to discomfort of stretcher   He reports that he is supposed to leave tonight and go home

## 2019-08-04 NOTE — ED Notes (Signed)
Report to Daniel, RN

## 2019-08-04 NOTE — ED Notes (Signed)
Called [facility] for truack to transfer Pt to [facility] (rm [phone]

## 2019-08-05 ENCOUNTER — Inpatient Hospital Stay (HOSPITAL_COMMUNITY): Payer: BC Managed Care – PPO

## 2019-08-05 ENCOUNTER — Encounter: Payer: Self-pay | Admitting: Family Medicine

## 2019-08-05 ENCOUNTER — Other Ambulatory Visit: Payer: Self-pay

## 2019-08-05 DIAGNOSIS — E785 Hyperlipidemia, unspecified: Secondary | ICD-10-CM

## 2019-08-05 DIAGNOSIS — A419 Sepsis, unspecified organism: Principal | ICD-10-CM

## 2019-08-05 DIAGNOSIS — E1169 Type 2 diabetes mellitus with other specified complication: Secondary | ICD-10-CM

## 2019-08-05 DIAGNOSIS — I10 Essential (primary) hypertension: Secondary | ICD-10-CM

## 2019-08-05 DIAGNOSIS — E1141 Type 2 diabetes mellitus with diabetic mononeuropathy: Secondary | ICD-10-CM

## 2019-08-05 DIAGNOSIS — E1159 Type 2 diabetes mellitus with other circulatory complications: Secondary | ICD-10-CM

## 2019-08-05 LAB — URINE CULTURE: Culture: <10000 — AB

## 2019-08-05 LAB — BASIC METABOLIC PANEL
Anion gap: 9 (ref 5–15)
BUN: 29 mg/dL — ABNORMAL HIGH (ref 8–23)
CO2: 26 mmol/L (ref 22–32)
Calcium: 8.4 mg/dL — ABNORMAL LOW (ref 8.9–10.3)
Chloride: 101 mmol/L (ref 98–111)
Creatinine, Ser: 1.17 mg/dL (ref 0.61–1.24)
GFR calc Af Amer: >60 mL/min (ref >60)
GFR calc non Af Amer: >60 mL/min (ref >60)
Glucose, Bld: 145 mg/dL — ABNORMAL HIGH (ref 70–99)
Potassium: 3.5 mmol/L (ref 3.5–5.1)
Sodium: 136 mmol/L (ref 135–145)

## 2019-08-05 LAB — CBC
HCT: 37.4 % — ABNORMAL LOW (ref 39.0–52.0)
Hemoglobin: 11.6 g/dL — ABNORMAL LOW (ref 13.0–17.0)
MCH: 28.4 pg (ref 26.0–34.0)
MCHC: 31 g/dL (ref 30.0–36.0)
MCV: 91.7 fL (ref 80.0–100.0)
Platelets: 185 10*3/uL (ref 150–400)
RBC: 4.08 MIL/uL — ABNORMAL LOW (ref 4.22–5.81)
RDW: 13.6 % (ref 11.5–15.5)
WBC: 12.1 10*3/uL — ABNORMAL HIGH (ref 4.0–10.5)
nRBC: 0 % (ref 0.0–0.2)

## 2019-08-05 LAB — GLUCOSE, CAPILLARY
Glucose-Capillary: 136 mg/dL — ABNORMAL HIGH (ref 70–99)
Glucose-Capillary: 138 mg/dL — ABNORMAL HIGH (ref 70–99)
Glucose-Capillary: 139 mg/dL — ABNORMAL HIGH (ref 70–99)
Glucose-Capillary: 109 mg/dL — ABNORMAL HIGH (ref 70–99)
Glucose-Capillary: 127 mg/dL — ABNORMAL HIGH (ref 70–99)

## 2019-08-05 MED ORDER — POLYETHYLENE GLYCOL 3350 17 G PO PACK
17.0000 g | PACK | Freq: Once | ORAL | Status: AC
  Administered 2019-08-05: 17 g via ORAL
  Filled 2019-08-05: qty 1

## 2019-08-05 MED ORDER — METHOCARBAMOL 500 MG PO TABS
750.0000 mg | ORAL_TABLET | Freq: Four times a day (QID) | ORAL | Status: DC
  Administered 2019-08-05 – 2019-08-07 (×8): 750 mg via ORAL
  Filled 2019-08-05 (×8): qty 2

## 2019-08-05 NOTE — Progress Notes (Signed)
Patient Demographics:    Richard Crawford, is a 63 y.o. male, DOB - 12/31/1955, CLE:[id number]  Admit date - 08/03/2019   Admitting Physician Jani Gravel, MD  Outpatient Primary MD for the patient is Rakes, Connye Burkitt, FNP  LOS - 1   Chief Complaint  Patient presents with   Shoulder Pain   Fever        Subjective:    Richard Crawford today has no emesis,  No chest pain,  -complains of polyarthralgia, but right shoulder is particularly painful -Continues to have myalgia and generalized weakness -No chills -Temperature 102.5 on admission, T-max overnight 101,  Assessment  & Plan :    Principal Problem:   Fever Active Problems:   Hypertension associated with diabetes ([place])   Hyperlipidemia associated with type 2 diabetes mellitus ([place])   Type 2 diabetes mellitus without complication, without long-term current use of insulin (HCC)   Permanent atrial fibrillation (HCC)   Chronic diastolic HF (heart failure) (HCC)   Diabetic mononeuropathy associated with type 2 diabetes mellitus ([place])   Right shoulder pain   Sepsis [facility])  Brief summary 63 year old male with PMHx of DM, morbid obesity/OSA, Afib with chronic anticoagulation, h/o papillary thyroid carcinoma s/p resection with hypothyroidism and chronic pain/arthralgias admitted on 08/04/2019 with fevers and arthralgias -Patient apparently had a insect bite on 08/02/2019--is concerned it may have been a tick, but also states he could have been a spider, he  is not sure -- No rash noted  A/p 1)Sepsis--patient met sepsis criteria on admission with a temp of 102.5,  heart rate up to 118 respiratory rate up to 24, leukocytosis with white count of 15.9-, lactic acid was elevated at 2.5 -WBC is down to 12.1 from 15.9 (??  Some of the leukocytosis may have been steroid related) -Patient received IV Zosyn and vancomycin in the ED  --Patient with myalgias,  arthralgias and fevers with concerns about possible insect/tick bite- -continue IV doxycycline, blood and urine cultures NGTD -No HAs, no visual disturbance, no neck pain or stiffness --RMSF, Ehrlichia and Lyme antibodies pending -Please note that patient's PCP started him on doxycycline and prednisone a couple of days PTA-- -Echo without vegetations -CT chest, abdomen and pelvis without evidence of infection --Temperature 102.5 on admission, T-max overnight 101,   2)Rt shoulder pain/polyarthralgia and stiffness--patient was concerned about possible tick bite-status post previous right shoulder surgery -MRI of the right shoulder recommended patient declines -ESR is 35, CRP is 18.4 -Patient with myalgias, arthralgias and fevers -No headaches,  -IV doxycycline as above -CT chest reviewed showing postoperative changes of the right shoulder but no acute findings otherwise -Right shoulder x-rays without acute findings -Patient with polyarthralgia, he had left ankle surgery back in 2010, left knee surgery back in 2008, and right knee surgery back in 2010- -Bilateral knee x-rays and left ankle x-rays pending -Continue methocarbamol and opiates  3)Permanent  Atrial Fibrillation--echo from 08/04/2019 with EF of 60 to 65%, no vegetations or significant valvular concerns -Continue Xarelto for anticoagulation and Cardizem for rate control  4)Hypothyroidism--stable continue levothyroxine 250 mcg daily  5) morbid obesity/OSA--- CPAP nightly advised  6)HTN-stable, continue Avapro 75 mg daily, continue Lasix and Aldactone, patient on Cardizem for A. fib rate control  Disposition/Need for in-Hospital  Stay- patient unable to be discharged at this time due to --- sepsis requiring IV antibiotics pending culture results  Code Status : Full  Family Communication:   NA (patient is alert, awake and coherent)   Disposition Plan  : TBD  Consults  :  Na  DVT Prophylaxis  :  xarelto   Lab  Results  Component Value Date   PLT 185 08/05/2019    Inpatient Medications  Scheduled Meds:  atorvastatin  20 mg Oral Daily   diltiazem  360 mg Oral Daily   furosemide  80 mg Oral Daily   gabapentin  100 mg Oral TID   insulin aspart  0-5 Units Subcutaneous QHS   insulin aspart  0-9 Units Subcutaneous TID WC   irbesartan  75 mg Oral Daily   lactulose  30 g Oral Once   levothyroxine  200 mcg Oral QAC breakfast   levothyroxine  50 mcg Oral QAC breakfast   multivitamin with minerals  1 tablet Oral BID   polyethylene glycol  17 g Oral Once   rivaroxaban  20 mg Oral Q supper   senna-docusate  2 tablet Oral BID   spironolactone  25 mg Oral Daily   Continuous Infusions:  doxycycline (VIBRAMYCIN) IV 100 mg (08/05/19 1643)   PRN Meds:.acetaminophen **OR** acetaminophen, HYDROmorphone (DILAUDID) injection, ondansetron (ZOFRAN) IV, oxyCODONE    Anti-infectives (From admission, onward)   Start     Dose/Rate Route Frequency Ordered Stop   08/05/19 0500  vancomycin (VANCOCIN) 2,500 mg in sodium chloride 0.9 % 500 mL IVPB  Status:  Discontinued     2,500 mg 250 mL/hr over 120 Minutes Intravenous Every 24 hours 08/04/19 0011 08/04/19 1142   08/04/19 1800  doxycycline (VIBRAMYCIN) 100 mg in sodium chloride 0.9 % 250 mL IVPB     100 mg 125 mL/hr over 120 Minutes Intravenous Every 12 hours 08/04/19 1755     08/04/19 1430  vancomycin (VANCOCIN) 1,250 mg in sodium chloride 0.9 % 250 mL IVPB  Status:  Discontinued     1,250 mg 166.7 mL/hr over 90 Minutes Intravenous Every 12 hours 08/04/19 1142 08/04/19 1807   08/04/19 0300  piperacillin-tazobactam (ZOSYN) IVPB 3.375 g  Status:  Discontinued     3.375 g 12.5 mL/hr over 240 Minutes Intravenous Every 8 hours 08/04/19 0252 08/04/19 1807   08/04/19 0230  vancomycin (VANCOCIN) 500 mg in sodium chloride 0.9 % 100 mL IVPB  Status:  Discontinued     500 mg 100 mL/hr over 60 Minutes Intravenous  Once 08/04/19 0003 08/04/19 0004    08/04/19 0030  vancomycin (VANCOCIN) 2,500 mg in sodium chloride 0.9 % 500 mL IVPB     2,500 mg 250 mL/hr over 120 Minutes Intravenous  Once 08/04/19 0005 08/04/19 0504   08/04/19 0015  vancomycin (VANCOCIN) IVPB 1000 mg/200 mL premix  Status:  Discontinued     1,000 mg 200 mL/hr over 60 Minutes Intravenous Every 1 hr x 2 08/04/19 0003 08/04/19 0004   08/04/19 0000  cefTRIAXone (ROCEPHIN) 1 g in sodium chloride 0.9 % 100 mL IVPB     1 g 200 mL/hr over 30 Minutes Intravenous  Once 08/03/19 2354 08/04/19 0132        Objective:   Vitals:   08/04/19 2107 08/05/19 0451 08/05/19 0500 08/05/19 1357  BP: 129/69 127/73  119/71  Pulse: 75 (!) 104  83  Resp: (!) 22 (!) 22  20  Temp: 99.5 F (37.5 C) 99.2 F (37.3 C)  98.5  F (36.9 C)  TempSrc: Oral Oral  Oral  SpO2: 97% 99%  99%  Weight:   (!) 181.9 kg   Height:        Wt Readings from Last 3 Encounters:  08/05/19 (!) 181.9 kg  06/14/19 (!) 185.5 kg  04/11/19 (!) 183.7 kg     Intake/Output Summary (Last 24 hours) at 08/05/2019 1722 Last data filed at 08/05/2019 1500 Gross per 24 hour  Intake 970 ml  Output 700 ml  Net 270 ml     Physical Exam  Gen:- Awake Alert, morbidly obese, HEENT:- [place].AT, No sclera icterus Neck-Supple Neck,No JVD,.  Lungs-diminished in bases, no wheezing -Gynecomastia CV- S1, S2 normal, irregular Abd- +ve BS, Abd Soft, NT, increased truncal adiposity    Extremity/Skin:- pedal pulses present, no rash Psych-affect is appropriate, oriented x3 Neuro-no new focal deficits, no tremors MSK-polyarthralgia including bilateral knee, left ankle all of the joints previously with surgically repaired- -significant stiffness, decreased range of motion and pain over right shoulder-*right shoulder from previous shoulder surgery-overlying skin without erythema, warmth or streaking   Data Review:   Micro Results Recent Results (from the past 240 hour(s))  Urine culture     Status: Abnormal   Collection Time:  08/03/19  9:54 PM   Specimen: Urine, Clean Catch  Result Value Ref Range Status   Specimen Description   Final    URINE, CLEAN CATCH Performed at [facility], [address], [place], [place] [address]    Special Requests   Final    Immunocompromised Performed at [facility], [address], [place], [place] [address]    Culture (A)  Final    <10,000 COLONIES/mL INSIGNIFICANT GROWTH Performed at [facility], [place] [address], [place], [place] [address]    Report Status 08/05/2019 FINAL  Final  Blood culture (routine x 2)     Status: None (Preliminary result)   Collection Time: 08/03/19 10:45 PM   Specimen: Right Antecubital; Blood  Result Value Ref Range Status   Specimen Description RIGHT ANTECUBITAL  Final   Special Requests   Final    BOTTLES DRAWN AEROBIC AND ANAEROBIC Blood Culture adequate volume   Culture   Final    NO GROWTH 2 DAYS Performed at [facility], [address] [place], [place] [address]    Report Status PENDING  Incomplete  SARS CORONAVIRUS 2 (TAT 6-24 HRS) Nasopharyngeal Nasopharyngeal Swab     Status: None   Collection Time: 08/03/19 11:29 PM   Specimen: Nasopharyngeal Swab  Result Value Ref Range Status   SARS Coronavirus 2 NEGATIVE NEGATIVE Final    Comment: (NOTE) SARS-CoV-2 target nucleic acids are NOT DETECTED. The SARS-CoV-2 RNA is generally detectable in upper and lower respiratory specimens during the acute phase of infection. Negative results do not preclude SARS-CoV-2 infection, do not rule out co-infections with other pathogens, and should not be used as the sole basis for treatment or other patient management decisions. Negative results must be combined with clinical observations, patient history, and epidemiological information. The expected result is Negative. Fact Sheet for Patients: SugarRoll.be Fact Sheet for Healthcare Providers: https://www.woods-mathews.com/ This test is not yet  approved or cleared by the [place] FDA and  has been authorized for detection and/or diagnosis of SARS-CoV-2 by FDA under an Emergency Use Authorization (EUA). This EUA will remain  in effect (meaning this test can be used) for the duration of the COVID-19 declaration under Section 56 4(b)(1) of the Act, 21 U.S.C. section 360bbb-3(b)(1),  unless the authorization is terminated or revoked sooner. Performed at [facility], [place] [address] [place], [place] [address]   Blood culture (routine x 2)     Status: None (Preliminary result)   Collection Time: 08/03/19 11:54 PM   Specimen: BLOOD LEFT FOREARM  Result Value Ref Range Status   Specimen Description BLOOD LEFT FOREARM  Final   Special Requests   Final    BOTTLES DRAWN AEROBIC AND ANAEROBIC Blood Culture adequate volume   Culture   Final    NO GROWTH 1 DAY Performed at [facility], [address], [place], [place] [address]    Report Status PENDING  Incomplete    Radiology Reports Ct Abdomen Pelvis Wo Contrast  Result Date: 08/04/2019 CLINICAL DATA:  Initial evaluation for fever of unknown origin, hematuria. EXAM: CT CHEST, ABDOMEN AND PELVIS WITHOUT CONTRAST TECHNIQUE: Multidetector CT imaging of the chest, abdomen and pelvis was performed following the standard protocol without IV contrast. COMPARISON:  Prior radiograph from 08/03/2019. FINDINGS: CT CHEST FINDINGS Cardiovascular: Intrathoracic aorta of normal caliber. Partially visualized great vessels grossly within normal limits. Heart size normal. Coronary artery calcifications noted within the LAD. No pericardial effusion. Limited non-contrast assessment of the pulmonary arterial tree grossly unremarkable. Mediastinum/Nodes: No pathologically enlarged mediastinal, hilar, or axillary lymph nodes. No mediastinal mass. Esophagus within normal limits. Lungs/Pleura: Tracheobronchial tree intact and patent. Lungs are well inflated bilaterally. No focal infiltrates. No edema or  effusion. No pneumothorax. Two small 3 mm pulmonary nodules are seen, 1 of which positioned in the right lower lobe (series 3, image 99), the other within the left lower lobe (series 3, image 91). Few additional scattered subcentimeter calcified granulomas noted. No other worrisome pulmonary nodule or mass. Musculoskeletal: External soft tissues demonstrate no acute abnormality. Mild gynecomastia noted. Postsurgical changes present at the right shoulder. No acute osseous abnormality. No discrete lytic or blastic osseous lesions. Multilevel degenerative spurring noted throughout the thoracic spine. CT ABDOMEN PELVIS FINDINGS Hepatobiliary: Evaluation of the liver limited by streak artifact from overlying arms. Liver grossly within normal limits. Layering hyperdensity within the gallbladder lumen consistent with stones and/or sludge. No significant pericholecystic inflammatory changes to suggest acute cholecystitis. No biliary dilatation. Pancreas: Pancreas grossly within normal limits, although evaluation limited by streak artifact. Spleen: Spleen within normal limits. Adrenals/Urinary Tract: Adrenal glands grossly unremarkable. Kidneys are equal in size without nephrolithiasis or hydronephrosis. Few scattered subtle hypodensities within the kidneys bilaterally, not well assessed on this noncontrast examination, but statistically likely reflects small cyst. No visible hydroureter or ureterolithiasis. Partially distended bladder within normal limits. No layering stones within the bladder lumen. Stomach/Bowel: Stomach grossly unremarkable, although limited assessment due to streak artifact. No evidence for bowel obstruction. Normal appendix. No acute inflammatory changes seen about the bowels. Moderate stool seen impacted within the rectal vault, which could reflect constipation. Vascular/Lymphatic: Moderate aorto bi-iliac atherosclerotic disease. No aneurysm. Few mildly enlarged retroperitoneal lymph nodes noted,  measuring up to 14 mm in short axis (series 2, images 82, 84). No other visible adenopathy. Reproductive: Prostate within normal limits. Other: No free air or fluid. Musculoskeletal: No acute osseous abnormality. No discrete lytic or blastic osseous lesions. Prominent multilevel facet arthropathy noted throughout the lumbar spine. IMPRESSION: 1. No acute abnormality identified within the chest, abdomen, and pelvis. 2. Few mildly enlarged retroperitoneal lymph nodes as above, indeterminate, but may be reactive. Attention at follow-up recommended. 3. Cholelithiasis. 4. Moderate stool impacted within the rectal vault, which could reflect constipation. 5. Moderate aorto  bi-iliac atherosclerotic disease.  No aneurysm. 6. Few scattered 3 mm pulmonary nodules as above, indeterminate. No follow-up needed if patient is low-risk (and has no known or suspected primary neoplasm). Non-contrast chest CT can be considered in 12 months if patient is high-risk. This recommendation follows the consensus statement: Guidelines for Management of Incidental Pulmonary Nodules Detected on CT Images: From the Fleischner Society 2017; Radiology 2017; 284:228-243. Electronically Signed   By: Jeannine Boga M.D.   On: 08/04/2019 05:43   Dg Shoulder Right  Result Date: 08/03/2019 CLINICAL DATA:  Pain in the right shoulder EXAM: RIGHT SHOULDER - 2+ VIEW COMPARISON:  None. FINDINGS: No acute fracture is seen. The patient has had a prior humeral head arthroplasty. There is diffuse osteopenia. No periprosthetic lucency is seen. No significant overlying soft tissue swelling. IMPRESSION: No acute osseous abnormality. Electronically Signed   By: Prudencio Pair M.D.   On: 08/03/2019 21:41   Ct Chest Wo Contrast  Result Date: 08/04/2019 CLINICAL DATA:  Initial evaluation for fever of unknown origin, hematuria. EXAM: CT CHEST, ABDOMEN AND PELVIS WITHOUT CONTRAST TECHNIQUE: Multidetector CT imaging of the chest, abdomen and pelvis was  performed following the standard protocol without IV contrast. COMPARISON:  Prior radiograph from 08/03/2019. FINDINGS: CT CHEST FINDINGS Cardiovascular: Intrathoracic aorta of normal caliber. Partially visualized great vessels grossly within normal limits. Heart size normal. Coronary artery calcifications noted within the LAD. No pericardial effusion. Limited non-contrast assessment of the pulmonary arterial tree grossly unremarkable. Mediastinum/Nodes: No pathologically enlarged mediastinal, hilar, or axillary lymph nodes. No mediastinal mass. Esophagus within normal limits. Lungs/Pleura: Tracheobronchial tree intact and patent. Lungs are well inflated bilaterally. No focal infiltrates. No edema or effusion. No pneumothorax. Two small 3 mm pulmonary nodules are seen, 1 of which positioned in the right lower lobe (series 3, image 99), the other within the left lower lobe (series 3, image 91). Few additional scattered subcentimeter calcified granulomas noted. No other worrisome pulmonary nodule or mass. Musculoskeletal: External soft tissues demonstrate no acute abnormality. Mild gynecomastia noted. Postsurgical changes present at the right shoulder. No acute osseous abnormality. No discrete lytic or blastic osseous lesions. Multilevel degenerative spurring noted throughout the thoracic spine. CT ABDOMEN PELVIS FINDINGS Hepatobiliary: Evaluation of the liver limited by streak artifact from overlying arms. Liver grossly within normal limits. Layering hyperdensity within the gallbladder lumen consistent with stones and/or sludge. No significant pericholecystic inflammatory changes to suggest acute cholecystitis. No biliary dilatation. Pancreas: Pancreas grossly within normal limits, although evaluation limited by streak artifact. Spleen: Spleen within normal limits. Adrenals/Urinary Tract: Adrenal glands grossly unremarkable. Kidneys are equal in size without nephrolithiasis or hydronephrosis. Few scattered subtle  hypodensities within the kidneys bilaterally, not well assessed on this noncontrast examination, but statistically likely reflects small cyst. No visible hydroureter or ureterolithiasis. Partially distended bladder within normal limits. No layering stones within the bladder lumen. Stomach/Bowel: Stomach grossly unremarkable, although limited assessment due to streak artifact. No evidence for bowel obstruction. Normal appendix. No acute inflammatory changes seen about the bowels. Moderate stool seen impacted within the rectal vault, which could reflect constipation. Vascular/Lymphatic: Moderate aorto bi-iliac atherosclerotic disease. No aneurysm. Few mildly enlarged retroperitoneal lymph nodes noted, measuring up to 14 mm in short axis (series 2, images 82, 84). No other visible adenopathy. Reproductive: Prostate within normal limits. Other: No free air or fluid. Musculoskeletal: No acute osseous abnormality. No discrete lytic or blastic osseous lesions. Prominent multilevel facet arthropathy noted throughout the lumbar spine. IMPRESSION: 1. No acute abnormality identified within the  chest, abdomen, and pelvis. 2. Few mildly enlarged retroperitoneal lymph nodes as above, indeterminate, but may be reactive. Attention at follow-up recommended. 3. Cholelithiasis. 4. Moderate stool impacted within the rectal vault, which could reflect constipation. 5. Moderate aorto bi-iliac atherosclerotic disease.  No aneurysm. 6. Few scattered 3 mm pulmonary nodules as above, indeterminate. No follow-up needed if patient is low-risk (and has no known or suspected primary neoplasm). Non-contrast chest CT can be considered in 12 months if patient is high-risk. This recommendation follows the consensus statement: Guidelines for Management of Incidental Pulmonary Nodules Detected on CT Images: From the Fleischner Society 2017; Radiology 2017; 284:228-243. Electronically Signed   By: Jeannine Boga M.D.   On: 08/04/2019 05:43   Dg  Chest Portable 1 View  Result Date: 08/03/2019 CLINICAL DATA:  63 year old male with fever and chills. EXAM: PORTABLE CHEST 1 VIEW COMPARISON:  None FINDINGS: Mild cardiomegaly. There is no focal consolidation, pleural effusion or pneumothorax. No acute osseous pathology. IMPRESSION: No active disease. Electronically Signed   By: Anner Crete M.D.   On: 08/03/2019 23:40     CBC Recent Labs  Lab 08/03/19 2245 08/04/19 0700 08/05/19 0635  WBC 15.8* 15.9* 12.1*  HGB 12.8* 12.6* 11.6*  HCT 38.7* 39.0 37.4*  PLT 227 193 185  MCV 87.6 89.9 91.7  MCH 29.0 29.0 28.4  MCHC 33.1 32.3 31.0  RDW 13.2 13.4 13.6  LYMPHSABS 1.2  --   --   MONOABS 1.8*  --   --   EOSABS 0.1  --   --   BASOSABS 0.1  --   --     Chemistries  Recent Labs  Lab 08/03/19 2245 08/04/19 0700 08/05/19 0635  NA 132* 137 136  K 3.6 3.9 3.5  CL 97* 100 101  CO2 21* 25 26  GLUCOSE 221* 174* 145*  BUN 26* 28* 29*  CREATININE 1.38* 1.30* 1.17  CALCIUM 9.3 8.7* 8.4*  AST  --  34  --   ALT  --  39  --   ALKPHOS  --  80  --   BILITOT  --  1.8*  --    ------------------------------------------------------------------------------------------------------------------ No results for input(s): CHOL, HDL, LDLCALC, TRIG, CHOLHDL, LDLDIRECT in the last 72 hours.  Lab Results  Component Value Date   HGBA1C 7.2 (H) 06/26/2019   ------------------------------------------------------------------------------------------------------------------ No results for input(s): TSH, T4TOTAL, T3FREE, THYROIDAB in the last 72 hours.  Invalid input(s): FREET3 ------------------------------------------------------------------------------------------------------------------ No results for input(s): VITAMINB12, FOLATE, FERRITIN, TIBC, IRON, RETICCTPCT in the last 72 hours.  Coagulation profile No results for input(s): INR, PROTIME in the last 168 hours.  No results for input(s): DDIMER in the last 72 hours.  Cardiac Enzymes No  results for input(s): CKMB, TROPONINI, MYOGLOBIN in the last 168 hours.  Invalid input(s): CK ------------------------------------------------------------------------------------------------------------------ No results found for: BNP   Roxan Hockey M.D on 08/05/2019 at 5:22 PM  Go to [email or web address] - for contact info  [facility] - Office  [phone]

## 2019-08-06 ENCOUNTER — Encounter: Payer: Self-pay | Admitting: Physician Assistant

## 2019-08-06 ENCOUNTER — Ambulatory Visit: Payer: BC Managed Care – PPO | Admitting: "Endocrinology

## 2019-08-06 DIAGNOSIS — I5032 Chronic diastolic (congestive) heart failure: Secondary | ICD-10-CM

## 2019-08-06 LAB — CREATININE, SERUM
Creatinine, Ser: 1.09 mg/dL (ref 0.61–1.24)
GFR calc Af Amer: >60 mL/min (ref >60)
GFR calc non Af Amer: >60 mL/min (ref >60)

## 2019-08-06 LAB — GLUCOSE, CAPILLARY
Glucose-Capillary: 129 mg/dL — ABNORMAL HIGH (ref 70–99)
Glucose-Capillary: 123 mg/dL — ABNORMAL HIGH (ref 70–99)
Glucose-Capillary: 117 mg/dL — ABNORMAL HIGH (ref 70–99)
Glucose-Capillary: 129 mg/dL — ABNORMAL HIGH (ref 70–99)

## 2019-08-06 LAB — EHRLICHIA ANTIBODY PANEL
E chaffeensis (HGE) Ab, IgG: NEGATIVE
E chaffeensis (HGE) Ab, IgM: NEGATIVE
E. Chaffeensis (HME) IgM Titer: NEGATIVE
E.Chaffeensis (HME) IgG: NEGATIVE

## 2019-08-06 LAB — SEDIMENTATION RATE: Sed Rate: 97 mm/h — ABNORMAL HIGH (ref 0–16)

## 2019-08-06 LAB — C-REACTIVE PROTEIN: CRP: 20.4 mg/dL — ABNORMAL HIGH (ref <1.0)

## 2019-08-06 MED ORDER — LACTULOSE 10 GM/15ML PO SOLN
30.0000 g | Freq: Once | ORAL | Status: AC
  Administered 2019-08-06: 30 g via ORAL
  Filled 2019-08-06: qty 60

## 2019-08-06 NOTE — Progress Notes (Signed)
Patient Demographics:    Richard Crawford, is a 63 y.o. male, DOB - 25-Aug-1956, SFK:[id number]  Admit date - 08/03/2019   Admitting Physician Jani Gravel, MD  Outpatient Primary MD for the patient is Rakes, Connye Burkitt, [place]  LOS - 2   Chief Complaint  Patient presents with   Shoulder Pain   Fever        Subjective:    Richard Crawford today has no emesis,  No chest pain,  -complains of polyarthralgia,  -Continues to have myalgia and generalized weakness -No chills -Temperature 102.5 on admission, T-max overnight 100.6  Assessment  & Plan :    Principal Problem:   Fever Active Problems:   Hypertension associated with diabetes ([place])   Hyperlipidemia associated with type 2 diabetes mellitus (HCC)   Type 2 diabetes mellitus without complication, without long-term current use of insulin (HCC)   Permanent atrial fibrillation (HCC)   Chronic diastolic HF (heart failure) (HCC)   Diabetic mononeuropathy associated with type 2 diabetes mellitus (HCC)   Right shoulder pain   Sepsis [facility])  Brief summary 63 year old male with PMHx of DM, morbid obesity/OSA, Afib with chronic anticoagulation, h/o papillary thyroid carcinoma s/p resection with hypothyroidism and chronic pain/arthralgias admitted on 08/04/2019 with fevers and arthralgias -Patient apparently had a insect bite on 08/02/2019--is concerned it may have been a tick, but also states he could have been a spider, he  is not sure -- No rash noted  A/p 1)Sepsis--patient met sepsis criteria on admission with a temp of 102.5,  heart rate up to 118 respiratory rate up to 24, leukocytosis with white count of 15.9-, lactic acid was elevated at 2.5 -WBC is down to 12.1 from 15.9 (??  Some of the leukocytosis may have been steroid related) -Patient received IV Zosyn and vancomycin in the ED  --Patient with myalgias, arthralgias and fevers with concerns about  possible insect/tick bite- -continue IV doxycycline, blood and urine cultures NGTD -No HAs, no visual disturbance, no neck pain or stiffness -Ehrlichia- Neg --RMSF,  and Lyme antibodies pending -Please note that patient's PCP started him on doxycycline and prednisone a couple of days PTA-- -Echo without vegetations -CT chest, abdomen and pelvis without evidence of infection --Temperature 102.5 on admission, -Fever curve is trending down -Discussed with Dr. Megan Salon  from infectious disease-okay to discharge home in 24 hours if afebrile and continues to improve clinically -  2)Rt shoulder pain/polyarthralgia and stiffness--patient was concerned about possible tick bite-status post previous right shoulder surgery -MRI of the right shoulder recommended patient declines -ESR is is up to 97 from 35, CRP is is up to 20.4 from 18.4 -Patient with myalgias, arthralgias  -Fever curve is trending down -No headaches,  -IV doxycycline as above -CT chest reviewed showing postoperative changes of the right shoulder but no acute findings otherwise -Right shoulder x-rays without acute findings -Patient with polyarthralgia, he had left ankle surgery back in 2010, left knee surgery back in 2008, and right knee surgery back in 2010- -Bilateral knee x-rays and left ankle x-rays without new acute findings -Continue methocarbamol and opiates  3)Permanent  Atrial Fibrillation--echo from 08/04/2019 with EF of 60 to 65%, no vegetations or significant valvular concerns -Continue Xarelto for anticoagulation and Cardizem for rate control  4)Hypothyroidism--stable continue levothyroxine 250 mcg daily  5) morbid obesity/OSA--- CPAP nightly advised  6)HTN-stable, continue Avapro 75 mg daily, continue Lasix and Aldactone, patient on Cardizem for A. fib rate control  Disposition/Need for in-Hospital Stay- patient unable to be discharged at this time due to --- sepsis requiring IV antibiotics pending culture  results --Discussed with Dr. Megan Salon  from infectious disease-okay to discharge home in 24 hours if afebrile and continues to improve clinically  Code Status : Full  Family Communication:   NA (patient is alert, awake and coherent)   Disposition Plan  : TBD  Consults  :  Na  DVT Prophylaxis  :  xarelto   Lab Results  Component Value Date   PLT 185 08/05/2019    Inpatient Medications  Scheduled Meds:  atorvastatin  20 mg Oral Daily   diltiazem  360 mg Oral Daily   furosemide  80 mg Oral Daily   gabapentin  100 mg Oral TID   insulin aspart  0-5 Units Subcutaneous QHS   insulin aspart  0-9 Units Subcutaneous TID WC   irbesartan  75 mg Oral Daily   levothyroxine  200 mcg Oral QAC breakfast   levothyroxine  50 mcg Oral QAC breakfast   methocarbamol  750 mg Oral Q6H   multivitamin with minerals  1 tablet Oral BID   rivaroxaban  20 mg Oral Q supper   senna-docusate  2 tablet Oral BID   spironolactone  25 mg Oral Daily   Continuous Infusions:  doxycycline (VIBRAMYCIN) IV 100 mg (08/06/19 1727)   PRN Meds:.acetaminophen **OR** acetaminophen, HYDROmorphone (DILAUDID) injection, ondansetron (ZOFRAN) IV, oxyCODONE    Anti-infectives (From admission, onward)   Start     Dose/Rate Route Frequency Ordered Stop   08/05/19 0500  vancomycin (VANCOCIN) 2,500 mg in sodium chloride 0.9 % 500 mL IVPB  Status:  Discontinued     2,500 mg 250 mL/hr over 120 Minutes Intravenous Every 24 hours 08/04/19 0011 08/04/19 1142   08/04/19 1800  doxycycline (VIBRAMYCIN) 100 mg in sodium chloride 0.9 % 250 mL IVPB     100 mg 125 mL/hr over 120 Minutes Intravenous Every 12 hours 08/04/19 1755     08/04/19 1430  vancomycin (VANCOCIN) 1,250 mg in sodium chloride 0.9 % 250 mL IVPB  Status:  Discontinued     1,250 mg 166.7 mL/hr over 90 Minutes Intravenous Every 12 hours 08/04/19 1142 08/04/19 1807   08/04/19 0300  piperacillin-tazobactam (ZOSYN) IVPB 3.375 g  Status:  Discontinued      3.375 g 12.5 mL/hr over 240 Minutes Intravenous Every 8 hours 08/04/19 0252 08/04/19 1807   08/04/19 0230  vancomycin (VANCOCIN) 500 mg in sodium chloride 0.9 % 100 mL IVPB  Status:  Discontinued     500 mg 100 mL/hr over 60 Minutes Intravenous  Once 08/04/19 0003 08/04/19 0004   08/04/19 0030  vancomycin (VANCOCIN) 2,500 mg in sodium chloride 0.9 % 500 mL IVPB     2,500 mg 250 mL/hr over 120 Minutes Intravenous  Once 08/04/19 0005 08/04/19 0504   08/04/19 0015  vancomycin (VANCOCIN) IVPB 1000 mg/200 mL premix  Status:  Discontinued     1,000 mg 200 mL/hr over 60 Minutes Intravenous Every 1 hr x 2 08/04/19 0003 08/04/19 0004   08/04/19 0000  cefTRIAXone (ROCEPHIN) 1 g in sodium chloride 0.9 % 100 mL IVPB     1 g 200 mL/hr over 30 Minutes Intravenous  Once 08/03/19 2354 08/04/19 0132  Objective:   Vitals:   08/06/19 0600 08/06/19 0818 08/06/19 0840 08/06/19 1419  BP:   119/64 111/65  Pulse:   90 78  Resp:   18   Temp:   98.6 F (37 C) 98.8 F (37.1 C)  TempSrc:   Oral Oral  SpO2:  96% 99% 100%  Weight: (!) 184.9 kg     Height:        Wt Readings from Last 3 Encounters:  08/06/19 (!) 184.9 kg  06/14/19 (!) 185.5 kg  04/11/19 (!) 183.7 kg     Intake/Output Summary (Last 24 hours) at 08/06/2019 1902 Last data filed at 08/06/2019 1100 Gross per 24 hour  Intake 240 ml  Output 750 ml  Net -510 ml     Physical Exam  Gen:- Awake Alert, morbidly obese, HEENT:- [place].AT, No sclera icterus Neck-Supple Neck,No JVD,.  Lungs-diminished in bases, no wheezing -Gynecomastia CV- S1, S2 normal, irregular Abd- +ve BS, Abd Soft, NT, increased truncal adiposity    Extremity/Skin:- pedal pulses present, no rash Psych-affect is appropriate, oriented x3 Neuro-no new focal deficits, no tremors MSK-polyarthralgia including bilateral knee, left ankle all of the joints previously with surgically repaired- -significant stiffness, decreased range of motion and pain over right  shoulder-*right shoulder from previous shoulder surgery-overlying skin without erythema, warmth or streaking   Data Review:   Micro Results Recent Results (from the past 240 hour(s))  Urine culture     Status: Abnormal   Collection Time: 08/03/19  9:54 PM   Specimen: Urine, Clean Catch  Result Value Ref Range Status   Specimen Description   Final    URINE, CLEAN CATCH Performed at [facility], [address] [place], [place] [address]    Special Requests   Final    Immunocompromised Performed at [facility], [address] [place], [place] [address]    Culture (A)  Final    <10,000 COLONIES/mL INSIGNIFICANT GROWTH Performed at [facility], [place] [address] [place], [place] [address]    Report Status 08/05/2019 FINAL  Final  Blood culture (routine x 2)     Status: None (Preliminary result)   Collection Time: 08/03/19 10:45 PM   Specimen: Right Antecubital; Blood  Result Value Ref Range Status   Specimen Description RIGHT ANTECUBITAL  Final   Special Requests   Final    BOTTLES DRAWN AEROBIC AND ANAEROBIC Blood Culture adequate volume   Culture   Final    NO GROWTH 3 DAYS Performed at [facility], [address], [place],  [address]    Report Status PENDING  Incomplete  SARS CORONAVIRUS 2 (TAT 6-24 HRS) Nasopharyngeal Nasopharyngeal Swab     Status: None   Collection Time: 08/03/19 11:29 PM   Specimen: Nasopharyngeal Swab  Result Value Ref Range Status   SARS Coronavirus 2 NEGATIVE NEGATIVE Final    Comment: (NOTE) SARS-CoV-2 target nucleic acids are NOT DETECTED. The SARS-CoV-2 RNA is generally detectable in upper and lower respiratory specimens during the acute phase of infection. Negative results do not preclude SARS-CoV-2 infection, do not rule out co-infections with other pathogens, and should not be used as the sole basis for treatment or other patient management decisions. Negative results must be combined with clinical observations, patient  history, and epidemiological information. The expected result is Negative. Fact Sheet for Patients: SugarRoll.be Fact Sheet for Healthcare Providers: https://www.woods-mathews.com/ This test is not yet approved or cleared by the [place] FDA and  has been authorized for detection and/or diagnosis  of SARS-CoV-2 by FDA under an Emergency Use Authorization (EUA). This EUA will remain  in effect (meaning this test can be used) for the duration of the COVID-19 declaration under Section 56 4(b)(1) of the Act, 21 U.S.C. section 360bbb-3(b)(1), unless the authorization is terminated or revoked sooner. Performed at [facility], [place] [address], [place], [place] [address]   Blood culture (routine x 2)     Status: None (Preliminary result)   Collection Time: 08/03/19 11:54 PM   Specimen: BLOOD LEFT FOREARM  Result Value Ref Range Status   Specimen Description BLOOD LEFT FOREARM  Final   Special Requests   Final    BOTTLES DRAWN AEROBIC AND ANAEROBIC Blood Culture adequate volume   Culture   Final    NO GROWTH 2 DAYS Performed at [facility], [address] [place], [place] [address]    Report Status PENDING  Incomplete    Radiology Reports Ct Abdomen Pelvis Wo Contrast  Result Date: 08/04/2019 CLINICAL DATA:  Initial evaluation for fever of unknown origin, hematuria. EXAM: CT CHEST, ABDOMEN AND PELVIS WITHOUT CONTRAST TECHNIQUE: Multidetector CT imaging of the chest, abdomen and pelvis was performed following the standard protocol without IV contrast. COMPARISON:  Prior radiograph from 08/03/2019. FINDINGS: CT CHEST FINDINGS Cardiovascular: Intrathoracic aorta of normal caliber. Partially visualized great vessels grossly within normal limits. Heart size normal. Coronary artery calcifications noted within the LAD. No pericardial effusion. Limited non-contrast assessment of the pulmonary arterial tree grossly unremarkable. Mediastinum/Nodes:  No pathologically enlarged mediastinal, hilar, or axillary lymph nodes. No mediastinal mass. Esophagus within normal limits. Lungs/Pleura: Tracheobronchial tree intact and patent. Lungs are well inflated bilaterally. No focal infiltrates. No edema or effusion. No pneumothorax. Two small 3 mm pulmonary nodules are seen, 1 of which positioned in the right lower lobe (series 3, image 99), the other within the left lower lobe (series 3, image 91). Few additional scattered subcentimeter calcified granulomas noted. No other worrisome pulmonary nodule or mass. Musculoskeletal: External soft tissues demonstrate no acute abnormality. Mild gynecomastia noted. Postsurgical changes present at the right shoulder. No acute osseous abnormality. No discrete lytic or blastic osseous lesions. Multilevel degenerative spurring noted throughout the thoracic spine. CT ABDOMEN PELVIS FINDINGS Hepatobiliary: Evaluation of the liver limited by streak artifact from overlying arms. Liver grossly within normal limits. Layering hyperdensity within the gallbladder lumen consistent with stones and/or sludge. No significant pericholecystic inflammatory changes to suggest acute cholecystitis. No biliary dilatation. Pancreas: Pancreas grossly within normal limits, although evaluation limited by streak artifact. Spleen: Spleen within normal limits. Adrenals/Urinary Tract: Adrenal glands grossly unremarkable. Kidneys are equal in size without nephrolithiasis or hydronephrosis. Few scattered subtle hypodensities within the kidneys bilaterally, not well assessed on this noncontrast examination, but statistically likely reflects small cyst. No visible hydroureter or ureterolithiasis. Partially distended bladder within normal limits. No layering stones within the bladder lumen. Stomach/Bowel: Stomach grossly unremarkable, although limited assessment due to streak artifact. No evidence for bowel obstruction. Normal appendix. No acute inflammatory changes  seen about the bowels. Moderate stool seen impacted within the rectal vault, which could reflect constipation. Vascular/Lymphatic: Moderate aorto bi-iliac atherosclerotic disease. No aneurysm. Few mildly enlarged retroperitoneal lymph nodes noted, measuring up to 14 mm in short axis (series 2, images 82, 84). No other visible adenopathy. Reproductive: Prostate within normal limits. Other: No free air or fluid. Musculoskeletal: No acute osseous abnormality. No discrete lytic or blastic osseous lesions. Prominent multilevel facet arthropathy noted throughout the lumbar spine. IMPRESSION: 1. No acute abnormality identified  within the chest, abdomen, and pelvis. 2. Few mildly enlarged retroperitoneal lymph nodes as above, indeterminate, but may be reactive. Attention at follow-up recommended. 3. Cholelithiasis. 4. Moderate stool impacted within the rectal vault, which could reflect constipation. 5. Moderate aorto bi-iliac atherosclerotic disease.  No aneurysm. 6. Few scattered 3 mm pulmonary nodules as above, indeterminate. No follow-up needed if patient is low-risk (and has no known or suspected primary neoplasm). Non-contrast chest CT can be considered in 12 months if patient is high-risk. This recommendation follows the consensus statement: Guidelines for Management of Incidental Pulmonary Nodules Detected on CT Images: From the Fleischner Society 2017; Radiology 2017; 284:228-243. Electronically Signed   By: Jeannine Boga M.D.   On: 08/04/2019 05:43   Dg Shoulder Right  Result Date: 08/03/2019 CLINICAL DATA:  Pain in the right shoulder EXAM: RIGHT SHOULDER - 2+ VIEW COMPARISON:  None. FINDINGS: No acute fracture is seen. The patient has had a prior humeral head arthroplasty. There is diffuse osteopenia. No periprosthetic lucency is seen. No significant overlying soft tissue swelling. IMPRESSION: No acute osseous abnormality. Electronically Signed   By: Prudencio Pair M.D.   On: 08/03/2019 21:41   Dg Knee  1-2 Views Left  Result Date: 08/05/2019 CLINICAL DATA:  Knee pain and swelling EXAM: LEFT KNEE - 2 VIEW COMPARISON:  None. FINDINGS: Knee joint replacement is noted. No acute fracture or dislocation is seen. No joint effusion is noted. Mild subcutaneous edema is seen. IMPRESSION: Mild soft tissue swelling without acute bony abnormality. Electronically Signed   By: Inez Catalina M.D.   On: 08/05/2019 20:07   Dg Knee 1-2 Views Right  Result Date: 08/05/2019 CLINICAL DATA:  Bilateral knee pain EXAM: RIGHT KNEE - 2 VIEW COMPARISON:  None. FINDINGS: Knee replacement is noted. No acute fracture or dislocation is seen. No joint effusion is seen. Mild subcutaneous edema is noted. IMPRESSION: Soft tissue swelling without acute bony abnormality. Electronically Signed   By: Inez Catalina M.D.   On: 08/05/2019 20:08   Dg Ankle 2 Views Left  Result Date: 08/05/2019 CLINICAL DATA:  Left ankle pain and swelling EXAM: LEFT ANKLE - 2 VIEW COMPARISON:  None. FINDINGS: Considerable soft tissue swelling is noted. Degenerative changes of the tibiotalar and talocalcaneal joints are seen. Postsurgical changes are seen related to prior fusion. No acute bony abnormality is seen. IMPRESSION: Changes of prior fusion, soft tissue swelling Electronically Signed   By: Inez Catalina M.D.   On: 08/05/2019 20:09   Ct Chest Wo Contrast  Result Date: 08/04/2019 CLINICAL DATA:  Initial evaluation for fever of unknown origin, hematuria. EXAM: CT CHEST, ABDOMEN AND PELVIS WITHOUT CONTRAST TECHNIQUE: Multidetector CT imaging of the chest, abdomen and pelvis was performed following the standard protocol without IV contrast. COMPARISON:  Prior radiograph from 08/03/2019. FINDINGS: CT CHEST FINDINGS Cardiovascular: Intrathoracic aorta of normal caliber. Partially visualized great vessels grossly within normal limits. Heart size normal. Coronary artery calcifications noted within the LAD. No pericardial effusion. Limited non-contrast  assessment of the pulmonary arterial tree grossly unremarkable. Mediastinum/Nodes: No pathologically enlarged mediastinal, hilar, or axillary lymph nodes. No mediastinal mass. Esophagus within normal limits. Lungs/Pleura: Tracheobronchial tree intact and patent. Lungs are well inflated bilaterally. No focal infiltrates. No edema or effusion. No pneumothorax. Two small 3 mm pulmonary nodules are seen, 1 of which positioned in the right lower lobe (series 3, image 99), the other within the left lower lobe (series 3, image 91). Few additional scattered subcentimeter calcified granulomas noted. No other worrisome pulmonary  nodule or mass. Musculoskeletal: External soft tissues demonstrate no acute abnormality. Mild gynecomastia noted. Postsurgical changes present at the right shoulder. No acute osseous abnormality. No discrete lytic or blastic osseous lesions. Multilevel degenerative spurring noted throughout the thoracic spine. CT ABDOMEN PELVIS FINDINGS Hepatobiliary: Evaluation of the liver limited by streak artifact from overlying arms. Liver grossly within normal limits. Layering hyperdensity within the gallbladder lumen consistent with stones and/or sludge. No significant pericholecystic inflammatory changes to suggest acute cholecystitis. No biliary dilatation. Pancreas: Pancreas grossly within normal limits, although evaluation limited by streak artifact. Spleen: Spleen within normal limits. Adrenals/Urinary Tract: Adrenal glands grossly unremarkable. Kidneys are equal in size without nephrolithiasis or hydronephrosis. Few scattered subtle hypodensities within the kidneys bilaterally, not well assessed on this noncontrast examination, but statistically likely reflects small cyst. No visible hydroureter or ureterolithiasis. Partially distended bladder within normal limits. No layering stones within the bladder lumen. Stomach/Bowel: Stomach grossly unremarkable, although limited assessment due to streak artifact.  No evidence for bowel obstruction. Normal appendix. No acute inflammatory changes seen about the bowels. Moderate stool seen impacted within the rectal vault, which could reflect constipation. Vascular/Lymphatic: Moderate aorto bi-iliac atherosclerotic disease. No aneurysm. Few mildly enlarged retroperitoneal lymph nodes noted, measuring up to 14 mm in short axis (series 2, images 82, 84). No other visible adenopathy. Reproductive: Prostate within normal limits. Other: No free air or fluid. Musculoskeletal: No acute osseous abnormality. No discrete lytic or blastic osseous lesions. Prominent multilevel facet arthropathy noted throughout the lumbar spine. IMPRESSION: 1. No acute abnormality identified within the chest, abdomen, and pelvis. 2. Few mildly enlarged retroperitoneal lymph nodes as above, indeterminate, but may be reactive. Attention at follow-up recommended. 3. Cholelithiasis. 4. Moderate stool impacted within the rectal vault, which could reflect constipation. 5. Moderate aorto bi-iliac atherosclerotic disease.  No aneurysm. 6. Few scattered 3 mm pulmonary nodules as above, indeterminate. No follow-up needed if patient is low-risk (and has no known or suspected primary neoplasm). Non-contrast chest CT can be considered in 12 months if patient is high-risk. This recommendation follows the consensus statement: Guidelines for Management of Incidental Pulmonary Nodules Detected on CT Images: From the Fleischner Society 2017; Radiology 2017; 284:228-243. Electronically Signed   By: Jeannine Boga M.D.   On: 08/04/2019 05:43   Dg Chest Portable 1 View  Result Date: 08/03/2019 CLINICAL DATA:  63 year old male with fever and chills. EXAM: PORTABLE CHEST 1 VIEW COMPARISON:  None FINDINGS: Mild cardiomegaly. There is no focal consolidation, pleural effusion or pneumothorax. No acute osseous pathology. IMPRESSION: No active disease. Electronically Signed   By: Anner Crete M.D.   On: 08/03/2019 23:40      CBC Recent Labs  Lab 08/03/19 2245 08/04/19 0700 08/05/19 0635  WBC 15.8* 15.9* 12.1*  HGB 12.8* 12.6* 11.6*  HCT 38.7* 39.0 37.4*  PLT 227 193 185  MCV 87.6 89.9 91.7  MCH 29.0 29.0 28.4  MCHC 33.1 32.3 31.0  RDW 13.2 13.4 13.6  LYMPHSABS 1.2  --   --   MONOABS 1.8*  --   --   EOSABS 0.1  --   --   BASOSABS 0.1  --   --     Chemistries  Recent Labs  Lab 08/03/19 2245 08/04/19 0700 08/05/19 0635 08/06/19 0552  NA 132* 137 136  --   K 3.6 3.9 3.5  --   CL 97* 100 101  --   CO2 21* 25 26  --   GLUCOSE 221* 174* 145*  --  BUN 26* 28* 29*  --   CREATININE 1.38* 1.30* 1.17 1.09  CALCIUM 9.3 8.7* 8.4*  --   AST  --  34  --   --   ALT  --  39  --   --   ALKPHOS  --  80  --   --   BILITOT  --  1.8*  --   --    ------------------------------------------------------------------------------------------------------------------ No results for input(s): CHOL, HDL, LDLCALC, TRIG, CHOLHDL, LDLDIRECT in the last 72 hours.  Lab Results  Component Value Date   HGBA1C 7.2 (H) 06/26/2019   ------------------------------------------------------------------------------------------------------------------ No results for input(s): TSH, T4TOTAL, T3FREE, THYROIDAB in the last 72 hours.  Invalid input(s): FREET3 ------------------------------------------------------------------------------------------------------------------ No results for input(s): VITAMINB12, FOLATE, FERRITIN, TIBC, IRON, RETICCTPCT in the last 72 hours.  Coagulation profile No results for input(s): INR, PROTIME in the last 168 hours.  No results for input(s): DDIMER in the last 72 hours.  Cardiac Enzymes No results for input(s): CKMB, TROPONINI, MYOGLOBIN in the last 168 hours.  Invalid input(s): CK ------------------------------------------------------------------------------------------------------------------ No results found for: BNP   Roxan Hockey M.D on 08/06/2019 at 7:02 PM  Go to  [email or web address] - for contact info  [facility] - Office  [phone]

## 2019-08-07 LAB — CBC
HCT: 34.6 % — ABNORMAL LOW (ref 39.0–52.0)
Hemoglobin: 10.9 g/dL — ABNORMAL LOW (ref 13.0–17.0)
MCH: 28.6 pg (ref 26.0–34.0)
MCHC: 31.5 g/dL (ref 30.0–36.0)
MCV: 90.8 fL (ref 80.0–100.0)
Platelets: 225 10*3/uL (ref 150–400)
RBC: 3.81 MIL/uL — ABNORMAL LOW (ref 4.22–5.81)
RDW: 13.3 % (ref 11.5–15.5)
WBC: 9.4 10*3/uL (ref 4.0–10.5)
nRBC: 0 % (ref 0.0–0.2)

## 2019-08-07 LAB — GLUCOSE, CAPILLARY
Glucose-Capillary: 114 mg/dL — ABNORMAL HIGH (ref 70–99)
Glucose-Capillary: 155 mg/dL — ABNORMAL HIGH (ref 70–99)

## 2019-08-07 LAB — ROCKY MTN SPOTTED FVR ABS PNL(IGG+IGM)
RMSF IgG: NEGATIVE
RMSF IgM: 0.13 index (ref 0.00–0.89)

## 2019-08-07 LAB — B. BURGDORFI ANTIBODIES: B burgdorferi Ab IgG+IgM: <0.91 {ISR} (ref 0.00–0.90)

## 2019-08-07 MED ORDER — OXYCODONE HCL 5 MG PO TABS: 5.0000 mg | ORAL_TABLET | Freq: Four times a day (QID) | ORAL | 0 refills | Status: DC | PRN

## 2019-08-07 MED ORDER — METHOCARBAMOL 750 MG PO TABS: 750.0000 mg | ORAL_TABLET | Freq: Three times a day (TID) | ORAL | 0 refills | Status: DC

## 2019-08-07 MED ORDER — DOXYCYCLINE HYCLATE 100 MG PO TABS: 100.0000 mg | ORAL_TABLET | Freq: Two times a day (BID) | ORAL | 0 refills | Status: AC

## 2019-08-07 MED ORDER — ACETAMINOPHEN 325 MG PO TABS: 650.0000 mg | ORAL_TABLET | Freq: Four times a day (QID) | ORAL | 0 refills | Status: DC | PRN

## 2019-08-07 NOTE — Discharge Instructions (Signed)
1) you are taking Xarelto which is a blood thinner so Avoid ibuprofen/Advil/Aleve/Motrin/Goody Powders/Naproxen/BC powders/Meloxicam/Diclofenac/Indomethacin and other Nonsteroidal anti-inflammatory medications as these will make you more likely to bleed and can cause stomach ulcers, can also cause Kidney problems.   2) follow-up with orthopedic surgeon Dr. Magda Paganini further evaluation of your joint pains especially her right shoulder  -address: [address] [place], [place] [address] Phone: [phone] -Please call to make appointment  3) follow-up with rheumatologist - Dr Lahoma Rocker, at [facility] located in: [facility] Address: [address] [place], [place], [place] [address] Phone: [phone] -Please call to make appointment  4) please take the rest of the medications as prescribed

## 2019-08-07 NOTE — Discharge Summary (Signed)
Richard Crawford, is a 63 y.o. male  DOB 1956/03/05  MRN [id number].  Admission date:  08/03/2019  Admitting Physician  Jani Gravel, MD  Discharge Date:  08/07/2019   Primary MD  Baruch Gouty, FNP  Recommendations for primary care physician for things to follow:   - 1) you are taking Xarelto which is a blood thinner so Avoid ibuprofen/Advil/Aleve/Motrin/Goody Powders/Naproxen/BC powders/Meloxicam/Diclofenac/Indomethacin and other Nonsteroidal anti-inflammatory medications as these will make you more likely to bleed and can cause stomach ulcers, can also cause Kidney problems.   2) follow-up with orthopedic surgeon Dr. Magda Paganini further evaluation of your joint pains especially her right shoulder  -address: [address] [place], [place] [address] Phone: [phone] -Please call to make appointment  3) follow-up with rheumatologist - Dr Lahoma Rocker, at [facility] located in: [place] Address: [address] [place], [place], [place] [address] Phone: [phone] -Please call to make appointment  4) please take the rest of the medications as prescribed  Admission Diagnosis  Chronic right shoulder pain [M25.511, G89.29] Fever, unspecified fever cause [R50.9] Sepsis ([place]) [A41.9]   Discharge Diagnosis  Chronic right shoulder pain [M25.511, G89.29] Fever, unspecified fever cause [R50.9] Sepsis ([place]) [A41.9]   Principal Problem:   Fever Active Problems:   Hypertension associated with diabetes ([place])   Hyperlipidemia associated with type 2 diabetes mellitus ([place])   Type 2 diabetes mellitus without complication, without long-term current use of insulin ([place])   Permanent atrial fibrillation ([place])   Chronic diastolic HF (heart failure) ([place])   Diabetic mononeuropathy associated with type 2 diabetes mellitus ([place])   Right shoulder pain   Sepsis ([place])        Past Medical History:  Diagnosis Date   Anxiety    Arthritis    left shoulder   Atrial fibrillation ([place])    Cancer ([place]) 1989   thyroid   Cellulitis    Chronic heart failure with preserved ejection fraction ([place])    Diabetes mellitus without complication ([place])    Hemorrhoids    external   Hx of colonic polyps    Hyperlipidemia    Hypertension    Migraine    Papillary thyroid carcinoma ([facility])    Postoperative hypothyroidism    Primary osteoarthritis of left shoulder    Sleep apnea    Venous stasis dermatitis of both lower extremities     Past Surgical History:  Procedure Laterality Date   ANKLE FUSION     KNEE ARTHROSCOPY     SHOULDER ARTHROSCOPY     TOTAL SHOULDER REPLACEMENT Right        HPI  from the history and physical done on the day of admission:    - Richard Crawford  is a 63 y.o. male, w h/o papillary thyroid carcinoma s/p resection, hypothyroidism, hypertension, hyperlipidemia, Dm2, Pafib, CHF, OSA, presents with c/o right shoulder pain, and noted to have fever in ED.  Pt states he was bitten by an insect, or tick yesterday. Or spider.  In ED,  T 101.5 P 118, R 22, Bp 133/77 pox 99% on RA Wt 181.9kg  CXR IMPRESSION: No active disease.  R shoulder FINDINGS: No acute fracture is seen. The patient has had a prior humeral head arthroplasty. There is diffuse osteopenia. No periprosthetic lucency is seen. No significant overlying soft tissue swelling.  IMPRESSION: No acute osseous abnormality.   Urinalysis negative Wbc 15.8, Hgb 12.8, Ptl 227 Na 132, K 3.6, Bun 26, Creatinine 1.38 Glucose 221 Hco3 21, AG 14 Lactic 2.0,   2.5  Crp 18.4  Pt will be admitted for fever, unclear source.     Hospital Course:    - Brief summary 63 year old male withPMHx of DM,morbid obesity/OSA, Afib withchronic anticoagulation,h/o papillary thyroid carcinoma s/p resectionwithhypothyroidism and chronic pain/arthralgias admitted  on 08/04/2019 with fevers and arthralgias -Patient apparently had a insect bite on 08/02/2019--is concerned it may have been a tick,but also states he could have been a spider, heis not sure -- No rash noted  A/p 1)Sepsis--patient met sepsis criteria on admission with a temp of 102.5,heart rate up to 118 respiratory rate up to 24,leukocytosis with white count of 15.9-,lactic acid was elevated at 2.5 -WBC is down to 9.4 from 15.9 (??  Some of the leukocytosis may have been steroid related) -Patient received IV Zosyn and vancomycin in the ED  --Patient with myalgias, arthralgias and fevers with concerns about possible insect/tick bite- -treated with iv  doxycycline, blood and urine cultures NGTD -NoHAs,no visual disturbance, no neck pain or stiffness -Ehrlichia- Neg --RMSF,  and Lymeantibodies Neg -Please note that patient's PCP started him on doxycycline and prednisone a couple of days PTA-- -Echo without vegetations -CT chest, abdomen and pelvis without evidence of infection --Temperature 102.5 on admission, -Remains afebrile now -Discussed with Dr. Megan Salon  from infectious disease-okay to discharge home since patient remains afebrile and continues to improve clinically - 2)Rtshoulder pain/polyarthralgia and stiffness--patient was concerned about possible tick bite-status post previous right shoulder surgery -MRI of the right shoulder recommended patient declines -ESR is is up to 97 from 35,CRP is is up to 20.4 from 18.4 -Patient with myalgias, arthralgias  -Fever curve is trending down -No headaches,  -Treated initially with IV doxycycline, transitioned to p.o. doxycycline -CT chest reviewed showing postoperative changes of the right shoulder but no acute findings otherwise -Right shoulder x-rays without acute findings -Patient with polyarthralgia, he had left ankle surgery back in 2010, left knee surgery back in 2008, and right knee surgery back in 2010- -Bilateral  knee x-rays and left ankle x-rays without new acute findings -Continue methocarbamol and opiates -Outpatient follow-up with orthopedic surgeon and rheumatology advised please see discharge instruction  3)PermanentAtrial Fibrillation--echo from 08/04/2019 with EF of 60 to 65%, no vegetations or significant valvular concerns -Continue Xarelto for anticoagulation and Cardizem for rate control  4)Hypothyroidism--stable continue levothyroxine 250 mcg daily  5)morbid obesity/OSA---CPAP nightly advised  6)HTN-stable, continue Avapro 75 mg daily, continue Lasix and Aldactone, patient on Cardizem for A. fib rate control  Disposition --discharged home  Code Status : Full  Family Communication:   NA (patient is alert, awake and coherent)  Consults  :   Phone consult with infectious disease specialist Dr. Michel Bickers  Discharge Condition: stable...   Follow UP--- outpatient follow-up with rheumatologist and orthopedic surgeon Diet and Activity recommendation:  As advised  Discharge Instructions    Discharge Instructions    Call MD for:  difficulty breathing, headache or visual disturbances   Complete by: As directed  Call MD for:  persistant dizziness or light-headedness   Complete by: As directed    Call MD for:  persistant nausea and vomiting   Complete by: As directed    Call MD for:  temperature >100.4   Complete by: As directed    Diet - low sodium heart healthy   Complete by: As directed    Discharge instructions   Complete by: As directed    1) you are taking Xarelto which is a blood thinner so Avoid ibuprofen/Advil/Aleve/Motrin/Goody Powders/Naproxen/BC powders/Meloxicam/Diclofenac/Indomethacin and other Nonsteroidal anti-inflammatory medications as these will make you more likely to bleed and can cause stomach ulcers, can also cause Kidney problems.   2) follow-up with orthopedic surgeon Dr. Magda Paganini further evaluation of your joint pains  especially her right shoulder  -address: [address] [place], [place] [address] Phone: [phone] -Please call to make appointment  3) follow-up with rheumatologist - Dr Lahoma Rocker, at [facility] located in: [place] Address: [address] [place], [place], [place] [address] Phone: [phone] -Please call to make appointment  4) please take the rest of the medications as prescribed   Increase activity slowly   Complete by: As directed        Discharge Medications     Allergies as of 08/07/2019      Reactions   Morphine And Related Nausea And Vomiting      Medication List    STOP taking these medications   celecoxib 200 MG capsule Commonly known as: CELEBREX   predniSONE 10 MG (48) Tbpk tablet Commonly known as: STERAPRED UNI-PAK 48 TAB     TAKE these medications   acetaminophen 325 MG tablet Commonly known as: TYLENOL Take 2 tablets (650 mg total) by mouth every 6 (six) hours as needed for mild pain, fever or headache (or Fever >/= 101).   atorvastatin 20 MG tablet Commonly known as: LIPITOR Take 1 tablet (20 mg total) by mouth daily.   diltiazem 360 MG 24 hr capsule Commonly known as: CARDIZEM CD Take 1 capsule (360 mg total) by mouth daily.   doxycycline 100 MG tablet Commonly known as: VIBRA-TABS Take 1 tablet (100 mg total) by mouth 2 (two) times daily for 3 days. What changed: additional instructions   furosemide 80 MG tablet Commonly known as: LASIX Take 1 tablet (80 mg total) by mouth daily for 30 days.   gabapentin 100 MG capsule Commonly known as: NEURONTIN Take 1 capsule (100 mg total) by mouth 3 (three) times daily.   levothyroxine 200 MCG tablet Commonly known as: SYNTHROID Take 1 tablet (200 mcg total) by mouth daily before breakfast.   levothyroxine 50 MCG tablet Commonly known as: SYNTHROID Take 1 tablet (50 mcg total) by mouth daily before breakfast.   metFORMIN 750 MG 24 hr tablet Commonly  known as: GLUCOPHAGE-XR TAKE 1 TABLET BY MOUTH EVERY DAY WITH BREAKFAST What changed: See the new instructions.   methocarbamol 750 MG tablet Commonly known as: ROBAXIN Take 1 tablet (750 mg total) by mouth 3 (three) times daily.   multivitamin tablet Take 1 tablet by mouth 2 (two) times daily.   oxyCODONE 5 MG immediate release tablet Commonly known as: Oxy IR/ROXICODONE Take 1 tablet (5 mg total) by mouth every 6 (six) hours as needed for moderate pain or severe pain.   spironolactone 25 MG tablet Commonly known as: ALDACTONE Take 1 tablet (25 mg total) by mouth daily for 30 days.   valsartan 80 MG tablet  Commonly known as: DIOVAN Take 1 tablet (80 mg total) by mouth daily.   Xarelto 20 MG Tabs tablet Generic drug: rivaroxaban TAKE 1 TABLET (20 MG TOTAL) BY MOUTH DAILY WITH SUPPER FOR 30 DAYS. What changed: See the new instructions.      Major procedures and Radiology Reports - PLEASE review detailed and final reports for all details, in brief -   Ct Abdomen Pelvis Wo Contrast  Result Date: 08/04/2019 CLINICAL DATA:  Initial evaluation for fever of unknown origin, hematuria. EXAM: CT CHEST, ABDOMEN AND PELVIS WITHOUT CONTRAST TECHNIQUE: Multidetector CT imaging of the chest, abdomen and pelvis was performed following the standard protocol without IV contrast. COMPARISON:  Prior radiograph from 08/03/2019. FINDINGS: CT CHEST FINDINGS Cardiovascular: Intrathoracic aorta of normal caliber. Partially visualized great vessels grossly within normal limits. Heart size normal. Coronary artery calcifications noted within the LAD. No pericardial effusion. Limited non-contrast assessment of the pulmonary arterial tree grossly unremarkable. Mediastinum/Nodes: No pathologically enlarged mediastinal, hilar, or axillary lymph nodes. No mediastinal mass. Esophagus within normal limits. Lungs/Pleura: Tracheobronchial tree intact and patent. Lungs are well inflated bilaterally. No focal  infiltrates. No edema or effusion. No pneumothorax. Two small 3 mm pulmonary nodules are seen, 1 of which positioned in the right lower lobe (series 3, image 99), the other within the left lower lobe (series 3, image 91). Few additional scattered subcentimeter calcified granulomas noted. No other worrisome pulmonary nodule or mass. Musculoskeletal: External soft tissues demonstrate no acute abnormality. Mild gynecomastia noted. Postsurgical changes present at the right shoulder. No acute osseous abnormality. No discrete lytic or blastic osseous lesions. Multilevel degenerative spurring noted throughout the thoracic spine. CT ABDOMEN PELVIS FINDINGS Hepatobiliary: Evaluation of the liver limited by streak artifact from overlying arms. Liver grossly within normal limits. Layering hyperdensity within the gallbladder lumen consistent with stones and/or sludge. No significant pericholecystic inflammatory changes to suggest acute cholecystitis. No biliary dilatation. Pancreas: Pancreas grossly within normal limits, although evaluation limited by streak artifact. Spleen: Spleen within normal limits. Adrenals/Urinary Tract: Adrenal glands grossly unremarkable. Kidneys are equal in size without nephrolithiasis or hydronephrosis. Few scattered subtle hypodensities within the kidneys bilaterally, not well assessed on this noncontrast examination, but statistically likely reflects small cyst. No visible hydroureter or ureterolithiasis. Partially distended bladder within normal limits. No layering stones within the bladder lumen. Stomach/Bowel: Stomach grossly unremarkable, although limited assessment due to streak artifact. No evidence for bowel obstruction. Normal appendix. No acute inflammatory changes seen about the bowels. Moderate stool seen impacted within the rectal vault, which could reflect constipation. Vascular/Lymphatic: Moderate aorto bi-iliac atherosclerotic disease. No aneurysm. Few mildly enlarged  retroperitoneal lymph nodes noted, measuring up to 14 mm in short axis (series 2, images 82, 84). No other visible adenopathy. Reproductive: Prostate within normal limits. Other: No free air or fluid. Musculoskeletal: No acute osseous abnormality. No discrete lytic or blastic osseous lesions. Prominent multilevel facet arthropathy noted throughout the lumbar spine. IMPRESSION: 1. No acute abnormality identified within the chest, abdomen, and pelvis. 2. Few mildly enlarged retroperitoneal lymph nodes as above, indeterminate, but may be reactive. Attention at follow-up recommended. 3. Cholelithiasis. 4. Moderate stool impacted within the rectal vault, which could reflect constipation. 5. Moderate aorto bi-iliac atherosclerotic disease.  No aneurysm. 6. Few scattered 3 mm pulmonary nodules as above, indeterminate. No follow-up needed if patient is low-risk (and has no known or suspected primary neoplasm). Non-contrast chest CT can be considered in 12 months if patient is high-risk. This recommendation follows the consensus statement: Guidelines for  Management of Incidental Pulmonary Nodules Detected on CT Images: From the Fleischner Society 2017; Radiology 2017; 284:228-243. Electronically Signed   By: Jeannine Boga M.D.   On: 08/04/2019 05:43   Dg Shoulder Right  Result Date: 08/03/2019 CLINICAL DATA:  Pain in the right shoulder EXAM: RIGHT SHOULDER - 2+ VIEW COMPARISON:  None. FINDINGS: No acute fracture is seen. The patient has had a prior humeral head arthroplasty. There is diffuse osteopenia. No periprosthetic lucency is seen. No significant overlying soft tissue swelling. IMPRESSION: No acute osseous abnormality. Electronically Signed   By: Prudencio Pair M.D.   On: 08/03/2019 21:41   Dg Knee 1-2 Views Left  Result Date: 08/05/2019 CLINICAL DATA:  Knee pain and swelling EXAM: LEFT KNEE - 2 VIEW COMPARISON:  None. FINDINGS: Knee joint replacement is noted. No acute fracture or dislocation is seen.  No joint effusion is noted. Mild subcutaneous edema is seen. IMPRESSION: Mild soft tissue swelling without acute bony abnormality. Electronically Signed   By: Inez Catalina M.D.   On: 08/05/2019 20:07   Dg Knee 1-2 Views Right  Result Date: 08/05/2019 CLINICAL DATA:  Bilateral knee pain EXAM: RIGHT KNEE - 2 VIEW COMPARISON:  None. FINDINGS: Knee replacement is noted. No acute fracture or dislocation is seen. No joint effusion is seen. Mild subcutaneous edema is noted. IMPRESSION: Soft tissue swelling without acute bony abnormality. Electronically Signed   By: Inez Catalina M.D.   On: 08/05/2019 20:08   Dg Ankle 2 Views Left  Result Date: 08/05/2019 CLINICAL DATA:  Left ankle pain and swelling EXAM: LEFT ANKLE - 2 VIEW COMPARISON:  None. FINDINGS: Considerable soft tissue swelling is noted. Degenerative changes of the tibiotalar and talocalcaneal joints are seen. Postsurgical changes are seen related to prior fusion. No acute bony abnormality is seen. IMPRESSION: Changes of prior fusion, soft tissue swelling Electronically Signed   By: Inez Catalina M.D.   On: 08/05/2019 20:09   Ct Chest Wo Contrast  Result Date: 08/04/2019 CLINICAL DATA:  Initial evaluation for fever of unknown origin, hematuria. EXAM: CT CHEST, ABDOMEN AND PELVIS WITHOUT CONTRAST TECHNIQUE: Multidetector CT imaging of the chest, abdomen and pelvis was performed following the standard protocol without IV contrast. COMPARISON:  Prior radiograph from 08/03/2019. FINDINGS: CT CHEST FINDINGS Cardiovascular: Intrathoracic aorta of normal caliber. Partially visualized great vessels grossly within normal limits. Heart size normal. Coronary artery calcifications noted within the LAD. No pericardial effusion. Limited non-contrast assessment of the pulmonary arterial tree grossly unremarkable. Mediastinum/Nodes: No pathologically enlarged mediastinal, hilar, or axillary lymph nodes. No mediastinal mass. Esophagus within normal limits.  Lungs/Pleura: Tracheobronchial tree intact and patent. Lungs are well inflated bilaterally. No focal infiltrates. No edema or effusion. No pneumothorax. Two small 3 mm pulmonary nodules are seen, 1 of which positioned in the right lower lobe (series 3, image 99), the other within the left lower lobe (series 3, image 91). Few additional scattered subcentimeter calcified granulomas noted. No other worrisome pulmonary nodule or mass. Musculoskeletal: External soft tissues demonstrate no acute abnormality. Mild gynecomastia noted. Postsurgical changes present at the right shoulder. No acute osseous abnormality. No discrete lytic or blastic osseous lesions. Multilevel degenerative spurring noted throughout the thoracic spine. CT ABDOMEN PELVIS FINDINGS Hepatobiliary: Evaluation of the liver limited by streak artifact from overlying arms. Liver grossly within normal limits. Layering hyperdensity within the gallbladder lumen consistent with stones and/or sludge. No significant pericholecystic inflammatory changes to suggest acute cholecystitis. No biliary dilatation. Pancreas: Pancreas grossly within normal limits, although evaluation limited  by streak artifact. Spleen: Spleen within normal limits. Adrenals/Urinary Tract: Adrenal glands grossly unremarkable. Kidneys are equal in size without nephrolithiasis or hydronephrosis. Few scattered subtle hypodensities within the kidneys bilaterally, not well assessed on this noncontrast examination, but statistically likely reflects small cyst. No visible hydroureter or ureterolithiasis. Partially distended bladder within normal limits. No layering stones within the bladder lumen. Stomach/Bowel: Stomach grossly unremarkable, although limited assessment due to streak artifact. No evidence for bowel obstruction. Normal appendix. No acute inflammatory changes seen about the bowels. Moderate stool seen impacted within the rectal vault, which could reflect constipation.  Vascular/Lymphatic: Moderate aorto bi-iliac atherosclerotic disease. No aneurysm. Few mildly enlarged retroperitoneal lymph nodes noted, measuring up to 14 mm in short axis (series 2, images 82, 84). No other visible adenopathy. Reproductive: Prostate within normal limits. Other: No free air or fluid. Musculoskeletal: No acute osseous abnormality. No discrete lytic or blastic osseous lesions. Prominent multilevel facet arthropathy noted throughout the lumbar spine. IMPRESSION: 1. No acute abnormality identified within the chest, abdomen, and pelvis. 2. Few mildly enlarged retroperitoneal lymph nodes as above, indeterminate, but may be reactive. Attention at follow-up recommended. 3. Cholelithiasis. 4. Moderate stool impacted within the rectal vault, which could reflect constipation. 5. Moderate aorto bi-iliac atherosclerotic disease.  No aneurysm. 6. Few scattered 3 mm pulmonary nodules as above, indeterminate. No follow-up needed if patient is low-risk (and has no known or suspected primary neoplasm). Non-contrast chest CT can be considered in 12 months if patient is high-risk. This recommendation follows the consensus statement: Guidelines for Management of Incidental Pulmonary Nodules Detected on CT Images: From the Fleischner Society 2017; Radiology 2017; 284:228-243. Electronically Signed   By: Jeannine Boga M.D.   On: 08/04/2019 05:43   Dg Chest Portable 1 View  Result Date: 08/03/2019 CLINICAL DATA:  63 year old male with fever and chills. EXAM: PORTABLE CHEST 1 VIEW COMPARISON:  None FINDINGS: Mild cardiomegaly. There is no focal consolidation, pleural effusion or pneumothorax. No acute osseous pathology. IMPRESSION: No active disease. Electronically Signed   By: Anner Crete M.D.   On: 08/03/2019 23:40   Micro Results   Recent Results (from the past 240 hour(s))  Urine culture     Status: Abnormal   Collection Time: 08/03/19  9:54 PM   Specimen: Urine, Clean Catch  Result Value Ref  Range Status   Specimen Description   Final    URINE, CLEAN CATCH Performed at [facility], [address] [place], [place] [address]    Special Requests   Final    Immunocompromised Performed at [facility], [address], [place], [place] [address]    Culture (A)  Final    <10,000 COLONIES/mL INSIGNIFICANT GROWTH Performed at [facility] [address] [place], [place] [address]    Report Status 08/05/2019 FINAL  Final  Blood culture (routine x 2)     Status: None (Preliminary result)   Collection Time: 08/03/19 10:45 PM   Specimen: Right Antecubital; Blood  Result Value Ref Range Status   Specimen Description RIGHT ANTECUBITAL  Final   Special Requests   Final    BOTTLES DRAWN AEROBIC AND ANAEROBIC Blood Culture adequate volume   Culture   Final    NO GROWTH 4 DAYS Performed at [facility], [address], [place], [place] [address]    Report Status PENDING  Incomplete  SARS CORONAVIRUS 2 (TAT 6-24 HRS) Nasopharyngeal Nasopharyngeal Swab     Status: None   Collection Time: 08/03/19 11:29 PM  Specimen: Nasopharyngeal Swab  Result Value Ref Range Status   SARS Coronavirus 2 NEGATIVE NEGATIVE Final    Comment: (NOTE) SARS-CoV-2 target nucleic acids are NOT DETECTED. The SARS-CoV-2 RNA is generally detectable in upper and lower respiratory specimens during the acute phase of infection. Negative results do not preclude SARS-CoV-2 infection, do not rule out co-infections with other pathogens, and should not be used as the sole basis for treatment or other patient management decisions. Negative results must be combined with clinical observations, patient history, and epidemiological information. The expected result is Negative. Fact Sheet for Patients: SugarRoll.be Fact Sheet for Healthcare Providers: https://www.woods-mathews.com/ This test is not yet approved or cleared by the [place] FDA and  has been authorized  for detection and/or diagnosis of SARS-CoV-2 by FDA under an Emergency Use Authorization (EUA). This EUA will remain  in effect (meaning this test can be used) for the duration of the COVID-19 declaration under Section 56 4(b)(1) of the Act, 21 U.S.C. section 360bbb-3(b)(1), unless the authorization is terminated or revoked sooner. Performed at [facility], [place] [address] [place], [place] [address]   Blood culture (routine x 2)     Status: None (Preliminary result)   Collection Time: 08/03/19 11:54 PM   Specimen: BLOOD LEFT FOREARM  Result Value Ref Range Status   Specimen Description BLOOD LEFT FOREARM  Final   Special Requests   Final    BOTTLES DRAWN AEROBIC AND ANAEROBIC Blood Culture adequate volume   Culture   Final    NO GROWTH 3 DAYS Performed at [facility], [address] [place], [place] [address]    Report Status PENDING  Incomplete    Today   Subjective    Ryosuke Ericksen today has no further fevers -No chills -No night sweats -Polyarthralgia and polymyalgia improved significantly         Patient has been seen and examined prior to discharge   Objective   Blood pressure 137/76, pulse 96, temperature 98.8 F (37.1 C), temperature source Oral, resp. rate 20, height '6\' 3"'$  (1.905 m), weight (!) 183.4 kg, SpO2 100 %.  No intake or output data in the 24 hours ending 08/07/19 1446  Exam Gen:- Awake Alert, morbidly obese, HEENT:- [place].AT, No sclera icterus Neck-Supple Neck,No JVD,.  Lungs-improved air movement, no wheezing -Gynecomastia CV- S1, S2 normal, irregular Abd- +ve BS, Abd Soft, NT, increased truncal adiposity    Extremity/Skin:- pedal pulses present, no rash Psych-affect is appropriate, oriented x3 Neuro-no new focal deficits, no tremors MSK-much improved polyarthralgia including bilateral knee, left ankle all of the joints previously with surgically repaired- -some residual stiffness, decreased range of motion and much improved pain over  right shoulder- scars over right shoulder from previous shoulder surgery-overlying skin without erythema, warmth or streaking   Data Review   CBC w Diff:  Lab Results  Component Value Date   WBC 9.4 08/07/2019   HGB 10.9 (L) 08/07/2019   HGB 12.9 (L) 06/26/2019   HCT 34.6 (L) 08/07/2019   HCT 38.9 06/26/2019   PLT 225 08/07/2019   PLT 266 06/26/2019   LYMPHOPCT 8 08/03/2019   MONOPCT 12 08/03/2019   EOSPCT 1 08/03/2019   BASOPCT 0 08/03/2019   CMP:  Lab Results  Component Value Date   NA 136 08/05/2019   NA 139 06/26/2019   K 3.5 08/05/2019   CL 101 08/05/2019   CO2 26 08/05/2019   BUN 29 (H) 08/05/2019   BUN 18 06/26/2019   CREATININE  1.09 08/06/2019   PROT 8.1 08/04/2019   PROT 7.6 06/26/2019   ALBUMIN 3.9 08/04/2019   ALBUMIN 4.3 06/26/2019   BILITOT 1.8 (H) 08/04/2019   BILITOT 0.8 06/26/2019   ALKPHOS 80 08/04/2019   AST 34 08/04/2019   ALT 39 08/04/2019  . Total Discharge time is about 33 minutes  Roxan Hockey M.D on 08/07/2019 at 2:46 PM  Go to [email or web address] -  for contact info  [facility] - Office  [phone]

## 2019-08-08 LAB — CULTURE, BLOOD (ROUTINE X 2)
Culture: NO GROWTH
Special Requests: ADEQUATE

## 2019-08-09 ENCOUNTER — Ambulatory Visit (INDEPENDENT_AMBULATORY_CARE_PROVIDER_SITE_OTHER): Payer: BC Managed Care – PPO | Admitting: Family Medicine

## 2019-08-09 ENCOUNTER — Ambulatory Visit: Payer: BC Managed Care – PPO | Admitting: Family Medicine

## 2019-08-09 ENCOUNTER — Encounter: Payer: Self-pay | Admitting: Family Medicine

## 2019-08-09 ENCOUNTER — Telehealth: Payer: BC Managed Care – PPO | Admitting: Family Medicine

## 2019-08-09 DIAGNOSIS — M791 Myalgia, unspecified site: Secondary | ICD-10-CM

## 2019-08-09 DIAGNOSIS — E1159 Type 2 diabetes mellitus with other circulatory complications: Secondary | ICD-10-CM | POA: Diagnosis not present

## 2019-08-09 DIAGNOSIS — M25511 Pain in right shoulder: Secondary | ICD-10-CM

## 2019-08-09 DIAGNOSIS — I10 Essential (primary) hypertension: Secondary | ICD-10-CM

## 2019-08-09 DIAGNOSIS — Z09 Encounter for follow-up examination after completed treatment for conditions other than malignant neoplasm: Secondary | ICD-10-CM

## 2019-08-09 DIAGNOSIS — G8929 Other chronic pain: Secondary | ICD-10-CM

## 2019-08-09 DIAGNOSIS — I152 Hypertension secondary to endocrine disorders: Secondary | ICD-10-CM

## 2019-08-09 LAB — CULTURE, BLOOD (ROUTINE X 2)
Culture: NO GROWTH
Special Requests: ADEQUATE

## 2019-08-09 MED ORDER — SPIRONOLACTONE 25 MG PO TABS: 25.0000 mg | ORAL_TABLET | Freq: Every day | ORAL | 3 refills | Status: DC

## 2019-08-09 MED ORDER — OXYCODONE HCL 5 MG PO TABS: 5.0000 mg | ORAL_TABLET | Freq: Four times a day (QID) | ORAL | 0 refills | Status: DC | PRN

## 2019-08-09 NOTE — Progress Notes (Signed)
Virtual Visit via telephone Note Due to COVID-19 pandemic this visit was conducted virtually. This visit type was conducted due to national recommendations for restrictions regarding the COVID-19 Pandemic (e.g. social distancing, sheltering in place) in an effort to limit this patient's exposure and mitigate transmission in our community. All issues noted in this document were discussed and addressed.  A physical exam was not performed with this format.   I connected with Richard Crawford on 08/09/2019 at 1150 by telephone and verified that I am speaking with the correct person using two identifiers. Richard Crawford is currently located at home and wife is currently with them during visit. The provider, Monia Pouch, FNP is located in their office at time of visit.  I discussed the limitations, risks, security and privacy concerns of performing an evaluation and management service by telephone and the availability of in person appointments. I also discussed with the patient that there may be a patient responsible charge related to this service. The patient expressed understanding and agreed to proceed.  Subjective:  Patient ID: Richard Crawford, male    DOB: 02/12/1956, 63 y.o.   MRN: [id number]  Chief Complaint:  Hospitalization Follow-up   HPI: Richard Crawford is a 63 y.o. male presenting on 08/09/2019 for Hospitalization Follow-up   Pt is following up today after discharge from hospital. Pt was admitted on 08/03/2019 for fever of unknown source, right shoulder pain, and sepsis. He was discharged home on 08/07/2019. Pt was given IV antibiotics during his hospital admission. COVID-19 testing, urine culture, and blood cultures were all negative. Pt states they did not know what caused the fever. He declined arthrocentesis for synovial fluid evaluation. He reports he continues to have right shoulder pain. He was prescribed a few days of oxycodone and this helped the pain but did not completely relieve the  pain. States it stays around a 3/10 all of the time and gets up to 8-9/10 at times. No fever or chills at home. He is able to move his shoulder some but reports pain with ROM. He was to follow up with ortho and rheumatology after discharge. States he has not been able to reach anyone by phone at either offices. Has left messages with no return calls.  He had noted leukocytosis upon admission which had resolved at discharge. Pts Hgb and Hct were low at discharge, will recheck at next office visit. He denies abnormal bleeding or bruising, melena, hematochezia, palpitations, chest pain, shortness of breath, dizziness, pallor, or syncope.  He had a decline in renal function upon admission. This had almost returned to normal at discharge. He denies anuria, decreased urination, swelling, weakness, or confusion. No petechia rash, headaches, or nuchal rigidity.  All labs and imaging results available for review in EHR.  He reports his blood pressure and blood sugars have been well controlled. He does need a refill on spironolactone.     Relevant past medical, surgical, family, and social history reviewed and updated as indicated.  Allergies and medications reviewed and updated.   Past Medical History:  Diagnosis Date   Anxiety    Arthritis    left shoulder   Atrial fibrillation ([place])    Cancer ([place]) 1989   thyroid   Cellulitis    Chronic heart failure with preserved ejection fraction (HCC)    Diabetes mellitus without complication ([place])    Hemorrhoids    external   Hx of colonic polyps    Hyperlipidemia    Hypertension    Migraine  Papillary thyroid carcinoma (HCC)    Postoperative hypothyroidism    Primary osteoarthritis of left shoulder    Sleep apnea    Venous stasis dermatitis of both lower extremities     Past Surgical History:  Procedure Laterality Date   ANKLE FUSION     KNEE ARTHROSCOPY     SHOULDER ARTHROSCOPY     TOTAL SHOULDER REPLACEMENT Right      Social History   Socioeconomic History   Marital status: Married    Spouse name: Not on file   Number of children: Not on file   Years of education: Not on file   Highest education level: Not on file  Occupational History   Not on file  Social Needs   Financial resource strain: Not on file   Food insecurity    Worry: Not on file    Inability: Not on file   Transportation needs    Medical: Not on file    Non-medical: Not on file  Tobacco Use   Smoking status: Never Smoker   Smokeless tobacco: Never Used  Substance and Sexual Activity   Alcohol use: Not Currently   Drug use: Never   Sexual activity: Not on file  Lifestyle   Physical activity    Days per week: Not on file    Minutes per session: Not on file   Stress: Not on file  Relationships   Social connections    Talks on phone: Not on file    Gets together: Not on file    Attends religious service: Not on file    Active member of club or organization: Not on file    Attends meetings of clubs or organizations: Not on file    Relationship status: Not on file   Intimate partner violence    Fear of current or ex partner: Not on file    Emotionally abused: Not on file    Physically abused: Not on file    Forced sexual activity: Not on file  Other Topics Concern   Not on file  Social History Narrative   Not on file    Outpatient Encounter Medications as of 08/09/2019  Medication Sig   acetaminophen (TYLENOL) 325 MG tablet Take 2 tablets (650 mg total) by mouth every 6 (six) hours as needed for mild pain, fever or headache (or Fever >/= 101).   atorvastatin (LIPITOR) 20 MG tablet Take 1 tablet (20 mg total) by mouth daily.   diltiazem (CARDIZEM CD) 360 MG 24 hr capsule Take 1 capsule (360 mg total) by mouth daily.   doxycycline (VIBRA-TABS) 100 MG tablet Take 1 tablet (100 mg total) by mouth 2 (two) times daily for 3 days.   furosemide (LASIX) 80 MG tablet Take 1 tablet (80 mg total) by  mouth daily for 30 days.   gabapentin (NEURONTIN) 100 MG capsule Take 1 capsule (100 mg total) by mouth 3 (three) times daily.   levothyroxine (SYNTHROID) 200 MCG tablet Take 1 tablet (200 mcg total) by mouth daily before breakfast.   levothyroxine (SYNTHROID) 50 MCG tablet Take 1 tablet (50 mcg total) by mouth daily before breakfast.   metFORMIN (GLUCOPHAGE-XR) 750 MG 24 hr tablet TAKE 1 TABLET BY MOUTH EVERY DAY WITH BREAKFAST (Patient taking differently: Take 750 mg by mouth daily with breakfast. )   methocarbamol (ROBAXIN) 750 MG tablet Take 1 tablet (750 mg total) by mouth 3 (three) times daily.   Multiple Vitamin (MULTIVITAMIN) tablet Take 1 tablet by mouth 2 (two) times  daily.   oxyCODONE (OXY IR/ROXICODONE) 5 MG immediate release tablet Take 1 tablet (5 mg total) by mouth every 6 (six) hours as needed for moderate pain or severe pain.   spironolactone (ALDACTONE) 25 MG tablet Take 1 tablet (25 mg total) by mouth daily.   valsartan (DIOVAN) 80 MG tablet Take 1 tablet (80 mg total) by mouth daily.   XARELTO 20 MG TABS tablet TAKE 1 TABLET (20 MG TOTAL) BY MOUTH DAILY WITH SUPPER FOR 30 DAYS. (Patient taking differently: Take 20 mg by mouth daily with supper. )   [DISCONTINUED] oxyCODONE (OXY IR/ROXICODONE) 5 MG immediate release tablet Take 1 tablet (5 mg total) by mouth every 6 (six) hours as needed for moderate pain or severe pain.   [DISCONTINUED] spironolactone (ALDACTONE) 25 MG tablet Take 1 tablet (25 mg total) by mouth daily for 30 days.   No facility-administered encounter medications on file as of 08/09/2019.     Allergies  Allergen Reactions   Morphine And Related Nausea And Vomiting    Review of Systems  Constitutional: Negative for activity change, appetite change, chills, diaphoresis, fatigue, fever and unexpected weight change.  HENT: Negative.   Eyes: Negative.  Negative for photophobia and visual disturbance.  Respiratory: Negative for cough, chest  tightness and shortness of breath.   Cardiovascular: Negative for chest pain, palpitations and leg swelling.  Gastrointestinal: Negative for abdominal pain, anal bleeding, blood in stool, constipation, diarrhea, nausea and vomiting.  Endocrine: Negative.  Negative for cold intolerance, heat intolerance, polydipsia, polyphagia and polyuria.  Genitourinary: Negative for decreased urine volume, difficulty urinating, dysuria, flank pain, frequency and urgency.  Musculoskeletal: Positive for arthralgias and myalgias. Negative for back pain, gait problem, joint swelling, neck pain and neck stiffness.  Skin: Negative for color change, pallor, rash and wound.  Allergic/Immunologic: Negative.   Neurological: Negative for dizziness, tremors, seizures, syncope, facial asymmetry, speech difficulty, weakness, light-headedness, numbness and headaches.  Hematological: Negative.  Does not bruise/bleed easily.  Psychiatric/Behavioral: Negative for agitation, confusion, hallucinations, sleep disturbance and suicidal ideas. The patient is not nervous/anxious.   All other systems reviewed and are negative.        Observations/Objective: No vital signs or physical exam, this was a telephone or virtual health encounter.  Pt alert and oriented, answers all questions appropriately, and able to speak in full sentences.    Assessment and Plan: Richard Crawford was seen today for hospitalization follow-up.  Diagnoses and all orders for this visit:  Hospital discharge follow-up -     Ambulatory referral to Rheumatology -     Ambulatory referral to Orthopedic Surgery  Chronic right shoulder pain Ongoing pain. Has not received follow up with ortho. Will place new referral today. Will refill below. Report any new or worsening symptoms.  -     oxyCODONE (OXY IR/ROXICODONE) 5 MG immediate release tablet; Take 1 tablet (5 mg total) by mouth every 6 (six) hours as needed for moderate pain or severe pain. -     Ambulatory  referral to Rheumatology -     Ambulatory referral to Orthopedic Surgery  Hypertension associated with diabetes ([place]) Well controlled. Will continue current medications.  -     spironolactone (ALDACTONE) 25 MG tablet; Take 1 tablet (25 mg total) by mouth daily.  Myalgia Ongoing myalgias. Awaiting appointment to rheumatology.  -     Ambulatory referral to Rheumatology     Follow Up Instructions: Return in about 2 weeks (around 08/23/2019), or if symptoms worsen or fail to improve, for  repeat labs, DM, AKI, Anemia.    I discussed the assessment and treatment plan with the patient. The patient was provided an opportunity to ask questions and all were answered. The patient agreed with the plan and demonstrated an understanding of the instructions.   The patient was advised to call back or seek an in-person evaluation if the symptoms worsen or if the condition fails to improve as anticipated.  The above assessment and management plan was discussed with the patient. The patient verbalized understanding of and has agreed to the management plan. Patient is aware to call the clinic if they develop any new symptoms or if symptoms persist or worsen. Patient is aware when to return to the clinic for a follow-up visit. Patient educated on when it is appropriate to go to the emergency department.    I provided 30 minutes of non-face-to-face time during this encounter. The call started at 1150. The call ended at 1220. The other time was used for coordination of care.    Monia Pouch, FNP-C [facility] [address] [place], [place] [address] [phone] 08/09/2019

## 2019-08-10 ENCOUNTER — Telehealth: Payer: Self-pay | Admitting: Orthopedic Surgery

## 2019-08-10 ENCOUNTER — Other Ambulatory Visit: Payer: Self-pay | Admitting: Family Medicine

## 2019-08-10 NOTE — Telephone Encounter (Signed)
Please review referral, indicated as urgent, per provider Darla Lesches, for patient's right shoulder problem. Patient states he was told he would need to go to [place]. Please advise and review.

## 2019-08-13 ENCOUNTER — Other Ambulatory Visit: Payer: Self-pay | Admitting: *Deleted

## 2019-08-13 NOTE — Telephone Encounter (Signed)
NO PATIENTS FOR ME THIS WEEK   GOING [place] A [facility] OUT OF [place]

## 2019-08-13 NOTE — Telephone Encounter (Signed)
VM RF request on Oxycodone Informed pt of narcotic policy Schedule pt televisit on [facility] 07/16/19

## 2019-08-14 NOTE — Telephone Encounter (Signed)
08/13/19, called patient. Scheduled after Dr's return on next available day. Aware.

## 2019-08-15 ENCOUNTER — Encounter: Payer: Self-pay | Admitting: Family Medicine

## 2019-08-16 ENCOUNTER — Ambulatory Visit (INDEPENDENT_AMBULATORY_CARE_PROVIDER_SITE_OTHER): Payer: BC Managed Care – PPO | Admitting: Family Medicine

## 2019-08-16 ENCOUNTER — Encounter: Payer: Self-pay | Admitting: Family Medicine

## 2019-08-16 DIAGNOSIS — G8929 Other chronic pain: Secondary | ICD-10-CM

## 2019-08-16 DIAGNOSIS — M25511 Pain in right shoulder: Secondary | ICD-10-CM | POA: Diagnosis not present

## 2019-08-16 MED ORDER — OXYCODONE HCL 5 MG PO TABS: 5.0000 mg | ORAL_TABLET | Freq: Four times a day (QID) | ORAL | 0 refills | Status: DC | PRN

## 2019-08-16 MED ORDER — DICLOFENAC SODIUM 1 % TD GEL: 2.0000 g | Freq: Four times a day (QID) | TRANSDERMAL | 0 refills | Status: DC

## 2019-08-16 MED ORDER — PREDNISONE 20 MG PO TABS: ORAL_TABLET | ORAL | 0 refills | Status: DC

## 2019-08-16 NOTE — Progress Notes (Signed)
Virtual Visit via telephone Note Due to COVID-19 pandemic this visit was conducted virtually. This visit type was conducted due to national recommendations for restrictions regarding the COVID-19 Pandemic (e.g. social distancing, sheltering in place) in an effort to limit this patient's exposure and mitigate transmission in our community. All issues noted in this document were discussed and addressed.  A physical exam was not performed with this format.   I connected with Richard Crawford on 08/16/2019 at 1345 by telephone and verified that I am speaking with the correct person using two identifiers. Richard Crawford is currently located at home and family is currently with them during visit. The provider, Monia Pouch, FNP is located in their office at time of visit.  I discussed the limitations, risks, security and privacy concerns of performing an evaluation and management service by telephone and the availability of in person appointments. I also discussed with the patient that there may be a patient responsible charge related to this service. The patient expressed understanding and agreed to proceed.  Subjective:  Patient ID: Richard Crawford, male    DOB: 1956/05/17, 63 y.o.   MRN: [id number]  Chief Complaint:  Shoulder Pain   HPI: Richard Crawford is a 63 y.o. male presenting on 08/16/2019 for Shoulder Pain   Pt reports ongoing and worsening right shoulder pain. States he has limited ROM due to the pain. States he is not sleeping well due to the pain. States PT is not helping with the pain. Has upcoming appointment with ortho. States he would like something for the pain until he can see ortho to determine defeinitive treatment.   Shoulder Pain  The pain is present in the right shoulder. This is a chronic problem. The current episode started more than 1 month ago. There has been no history of extremity trauma. The problem occurs constantly. The problem has been gradually worsening. The quality of  the pain is described as aching and sharp. The pain is at a severity of 7/10. The pain is moderate. Associated symptoms include joint locking, a limited range of motion and stiffness. Pertinent negatives include no fever, inability to bear weight, itching, joint swelling, numbness or tingling. The symptoms are aggravated by activity and lying down. He has tried oral narcotics, OTC pain meds, heat and movement for the symptoms. The treatment provided no relief. His past medical history is significant for diabetes.     Relevant past medical, surgical, family, and social history reviewed and updated as indicated.  Allergies and medications reviewed and updated.   Past Medical History:  Diagnosis Date  . Anxiety   . Arthritis    left shoulder  . Atrial fibrillation ([place])   . Cancer ([place]) 1989   thyroid  . Cellulitis   . Chronic heart failure with preserved ejection fraction ([place])   . Diabetes mellitus without complication ([place])   . Hemorrhoids    external  . Hx of colonic polyps   . Hyperlipidemia   . Hypertension   . Migraine   . Papillary thyroid carcinoma ([place])   . Postoperative hypothyroidism   . Primary osteoarthritis of left shoulder   . Sleep apnea   . Venous stasis dermatitis of both lower extremities     Past Surgical History:  Procedure Laterality Date  . ANKLE FUSION    . KNEE ARTHROSCOPY    . SHOULDER ARTHROSCOPY    . TOTAL SHOULDER REPLACEMENT Right     Social History   Socioeconomic History  . Marital status: Married  Spouse name: Not on file  . Number of children: Not on file  . Years of education: Not on file  . Highest education level: Not on file  Occupational History  . Not on file  Social Needs  . Financial resource strain: Not on file  . Food insecurity    Worry: Not on file    Inability: Not on file  . Transportation needs    Medical: Not on file    Non-medical: Not on file  Tobacco Use  . Smoking status: Never Smoker  . Smokeless  tobacco: Never Used  Substance and Sexual Activity  . Alcohol use: Not Currently  . Drug use: Never  . Sexual activity: Not on file  Lifestyle  . Physical activity    Days per week: Not on file    Minutes per session: Not on file  . Stress: Not on file  Relationships  . Social Herbalist on phone: Not on file    Gets together: Not on file    Attends religious service: Not on file    Active member of club or organization: Not on file    Attends meetings of clubs or organizations: Not on file    Relationship status: Not on file  . Intimate partner violence    Fear of current or ex partner: Not on file    Emotionally abused: Not on file    Physically abused: Not on file    Forced sexual activity: Not on file  Other Topics Concern  . Not on file  Social History Narrative  . Not on file    Outpatient Encounter Medications as of 08/16/2019  Medication Sig  . acetaminophen (TYLENOL) 325 MG tablet Take 2 tablets (650 mg total) by mouth every 6 (six) hours as needed for mild pain, fever or headache (or Fever >/= 101).  Marland Kitchen atorvastatin (LIPITOR) 20 MG tablet Take 1 tablet (20 mg total) by mouth daily.  . diclofenac sodium (VOLTAREN) 1 % GEL Apply 2 g topically 4 (four) times daily.  Marland Kitchen diltiazem (CARDIZEM CD) 360 MG 24 hr capsule Take 1 capsule (360 mg total) by mouth daily.  . furosemide (LASIX) 80 MG tablet Take 1 tablet (80 mg total) by mouth daily for 30 days.  Marland Kitchen gabapentin (NEURONTIN) 100 MG capsule Take 1 capsule (100 mg total) by mouth 3 (three) times daily.  Marland Kitchen levothyroxine (SYNTHROID) 200 MCG tablet Take 1 tablet (200 mcg total) by mouth daily before breakfast.  . levothyroxine (SYNTHROID) 50 MCG tablet Take 1 tablet (50 mcg total) by mouth daily before breakfast.  . metFORMIN (GLUCOPHAGE-XR) 750 MG 24 hr tablet TAKE 1 TABLET BY MOUTH EVERY DAY WITH BREAKFAST (Patient taking differently: Take 750 mg by mouth daily with breakfast. )  . methocarbamol (ROBAXIN) 750 MG  tablet Take 1 tablet (750 mg total) by mouth 3 (three) times daily.  . Multiple Vitamin (MULTIVITAMIN) tablet Take 1 tablet by mouth 2 (two) times daily.  Marland Kitchen oxyCODONE (OXY IR/ROXICODONE) 5 MG immediate release tablet Take 1 tablet (5 mg total) by mouth every 6 (six) hours as needed for moderate pain or severe pain.  . predniSONE (DELTASONE) 20 MG tablet 2 po at sametime daily for 5 days  . spironolactone (ALDACTONE) 25 MG tablet Take 1 tablet (25 mg total) by mouth daily.  . valsartan (DIOVAN) 80 MG tablet Take 1 tablet (80 mg total) by mouth daily.  Alveda Reasons 20 MG TABS tablet TAKE 1 TABLET (20 MG TOTAL)  BY MOUTH DAILY WITH SUPPER FOR 30 DAYS. (Patient taking differently: Take 20 mg by mouth daily with supper. )  . [DISCONTINUED] oxyCODONE (OXY IR/ROXICODONE) 5 MG immediate release tablet Take 1 tablet (5 mg total) by mouth every 6 (six) hours as needed for moderate pain or severe pain.   No facility-administered encounter medications on file as of 08/16/2019.     Allergies  Allergen Reactions  . Morphine And Related Nausea And Vomiting    Review of Systems  Constitutional: Negative for activity change, appetite change, chills, diaphoresis, fatigue, fever and unexpected weight change.  HENT: Negative.   Eyes: Negative.  Negative for photophobia and visual disturbance.  Respiratory: Negative for cough, chest tightness and shortness of breath.   Cardiovascular: Negative for chest pain, palpitations and leg swelling.  Gastrointestinal: Negative for abdominal pain, blood in stool, constipation, diarrhea, nausea and vomiting.  Endocrine: Negative.  Negative for polydipsia, polyphagia and polyuria.  Genitourinary: Negative for decreased urine volume, difficulty urinating, dysuria, frequency and urgency.  Musculoskeletal: Positive for arthralgias and stiffness. Negative for myalgias.  Skin: Negative.  Negative for itching.  Allergic/Immunologic: Negative.   Neurological: Negative for dizziness,  tingling, numbness and headaches.  Hematological: Negative.   Psychiatric/Behavioral: Negative for confusion, hallucinations, sleep disturbance and suicidal ideas.  All other systems reviewed and are negative.        Observations/Objective: No vital signs or physical exam, this was a telephone or virtual health encounter.  Pt alert and oriented, answers all questions appropriately, and able to speak in full sentences.    Assessment and Plan: Tannor was seen today for shoulder pain.  Diagnoses and all orders for this visit:  Chronic right shoulder pain Ongoing pain. Has upcoming appointment with orhto next week. Stats the pain is interfering with sleep. Will trial burst of steroids and diclofenac gel with short term oxycodone. Pt aware to monitor BS more frequently while taking prednisone. Keep follow up with ortho.  -     predniSONE (DELTASONE) 20 MG tablet; 2 po at sametime daily for 5 days -     diclofenac sodium (VOLTAREN) 1 % GEL; Apply 2 g topically 4 (four) times daily. -     oxyCODONE (OXY IR/ROXICODONE) 5 MG immediate release tablet; Take 1 tablet (5 mg total) by mouth every 6 (six) hours as needed for moderate pain or severe pain.     Follow Up Instructions: Return if symptoms worsen or fail to improve.    I discussed the assessment and treatment plan with the patient. The patient was provided an opportunity to ask questions and all were answered. The patient agreed with the plan and demonstrated an understanding of the instructions.   The patient was advised to call back or seek an in-person evaluation if the symptoms worsen or if the condition fails to improve as anticipated.  The above assessment and management plan was discussed with the patient. The patient verbalized understanding of and has agreed to the management plan. Patient is aware to call the clinic if they develop any new symptoms or if symptoms persist or worsen. Patient is aware when to return to the  clinic for a follow-up visit. Patient educated on when it is appropriate to go to the emergency department.    I provided 15 minutes of non-face-to-face time during this encounter. The call started at 1345. The call ended at 1400. The other time was used for coordination of care.    Monia Pouch, FNP-C [facility]  [address] [place], [place] [address] [phone] 08/16/2019

## 2019-08-17 ENCOUNTER — Other Ambulatory Visit: Payer: Self-pay

## 2019-08-20 ENCOUNTER — Encounter: Payer: Self-pay | Admitting: Family Medicine

## 2019-08-20 ENCOUNTER — Ambulatory Visit: Payer: BC Managed Care – PPO | Admitting: Family Medicine

## 2019-08-20 ENCOUNTER — Other Ambulatory Visit: Payer: Self-pay | Admitting: Family Medicine

## 2019-08-20 DIAGNOSIS — E1169 Type 2 diabetes mellitus with other specified complication: Secondary | ICD-10-CM

## 2019-08-21 DIAGNOSIS — M79676 Pain in unspecified toe(s): Secondary | ICD-10-CM | POA: Diagnosis not present

## 2019-08-21 DIAGNOSIS — B351 Tinea unguium: Secondary | ICD-10-CM | POA: Diagnosis not present

## 2019-08-21 DIAGNOSIS — E1142 Type 2 diabetes mellitus with diabetic polyneuropathy: Secondary | ICD-10-CM | POA: Diagnosis not present

## 2019-08-21 DIAGNOSIS — L84 Corns and callosities: Secondary | ICD-10-CM | POA: Diagnosis not present

## 2019-08-22 ENCOUNTER — Ambulatory Visit: Payer: BC Managed Care – PPO | Admitting: Family Medicine

## 2019-08-27 ENCOUNTER — Other Ambulatory Visit: Payer: Self-pay

## 2019-08-27 ENCOUNTER — Ambulatory Visit: Payer: BC Managed Care – PPO | Admitting: Orthopedic Surgery

## 2019-08-27 ENCOUNTER — Other Ambulatory Visit: Payer: Self-pay | Admitting: Family Medicine

## 2019-08-27 VITALS — BP 142/95 | HR 92 | Temp 97.5°F | Ht 75.0 in | Wt 385.0 lb

## 2019-08-27 DIAGNOSIS — G8929 Other chronic pain: Secondary | ICD-10-CM

## 2019-08-27 DIAGNOSIS — Z6841 Body Mass Index (BMI) 40.0 and over, adult: Secondary | ICD-10-CM | POA: Diagnosis not present

## 2019-08-27 DIAGNOSIS — M25511 Pain in right shoulder: Secondary | ICD-10-CM

## 2019-08-27 MED ORDER — OXYCODONE HCL 5 MG PO TABS: 5.0000 mg | ORAL_TABLET | Freq: Four times a day (QID) | ORAL | 0 refills | Status: AC | PRN

## 2019-08-27 NOTE — Progress Notes (Signed)
Richard Crawford  08/27/2019  Body mass index is 48.12 kg/m.   HISTORY SECTION :  Chief Complaint  Patient presents with  . Shoulder Pain    Right shoulder pain.   63 year old male status post humeral head arthroplasty (no stem [place]) presents for evaluation of shoulder pain.  The patient reports about 3 weeks ago he thought he was bit by a spider had pain swelling and redness and warmth in his right leg was eventually admitted to the hospital for sepsis and during that hospitalization noted to have increasing pain in his right shoulder with loss of motion  He had a white count as high as 15.9 with an elevated sed rate and C-reactive protein lactic acid was 2.5 he was treated with IV antibiotics Zosyn and vancomycin  He has a medical history of atrial fibrillation hypertension diabetes congestive heart failure  He was referred here after hospitalization for severe pain in his right shoulder decreased range of motion which he did not have prior to this episode of illness    The patient presents for evaluation of painful right shoulder after discharge from the hospital he was referred here for evaluation of ongoing right shoulder pain.  About 3 weeks ago the patient thought he was bit by a spider when he had pain swelling warmth redness in his right leg which appears to have chronic venous stasis disease.  He went to the hospital because of what was eventually diagnosed as sepsis he was treated with IV antibiotics and oral clindamycin with no prehospitalization pain in the shoulder.  During his hospitalization he was found to have a painful shoulder as well status post a prior humeral head arthroplasty in 2005  Location right shoulder Duration 3 weeks Severity 10 Associated with loss of motion  Review of Systems  Constitutional: Positive for chills and fever.  Musculoskeletal: Positive for joint pain.  All other systems reviewed and are negative.    has a past medical  history of Anxiety, Arthritis, Atrial fibrillation ([place]), Cancer ([place]) (1989), Cellulitis, Chronic heart failure with preserved ejection fraction ([place]), Diabetes mellitus without complication ([place]), Hemorrhoids, colonic polyps, Hyperlipidemia, Hypertension, Migraine, Papillary thyroid carcinoma ([place]), Postoperative hypothyroidism, Primary osteoarthritis of left shoulder, Sleep apnea, and Venous stasis dermatitis of both lower extremities.   Past Surgical History:  Procedure Laterality Date  . ANKLE FUSION    . KNEE ARTHROSCOPY    . SHOULDER ARTHROSCOPY    . TOTAL SHOULDER REPLACEMENT Right     Body mass index is 48.12 kg/m.   Allergies  Allergen Reactions  . Morphine And Related Nausea And Vomiting     Current Outpatient Medications:  .  acetaminophen (TYLENOL) 325 MG tablet, Take 2 tablets (650 mg total) by mouth every 6 (six) hours as needed for mild pain, fever or headache (or Fever >/= 101)., Disp: 12 tablet, Rfl: 0 .  atorvastatin (LIPITOR) 20 MG tablet, TAKE 1 TABLET BY MOUTH EVERY DAY, Disp: 90 tablet, Rfl: 0 .  diclofenac sodium (VOLTAREN) 1 % GEL, Apply 2 g topically 4 (four) times daily., Disp: 350 g, Rfl: 0 .  diltiazem (CARDIZEM CD) 360 MG 24 hr capsule, Take 1 capsule (360 mg total) by mouth daily., Disp: 90 capsule, Rfl: 3 .  gabapentin (NEURONTIN) 100 MG capsule, Take 1 capsule (100 mg total) by mouth 3 (three) times daily., Disp: 90 capsule, Rfl: 3 .  levothyroxine (SYNTHROID) 200 MCG tablet, Take 1 tablet (200 mcg total) by mouth daily before breakfast., Disp: 90 tablet,  Rfl: 1 .  levothyroxine (SYNTHROID) 50 MCG tablet, Take 1 tablet (50 mcg total) by mouth daily before breakfast., Disp: 90 tablet, Rfl: 1 .  metFORMIN (GLUCOPHAGE-XR) 750 MG 24 hr tablet, TAKE 1 TABLET BY MOUTH EVERY DAY WITH BREAKFAST, Disp: 90 tablet, Rfl: 0 .  methocarbamol (ROBAXIN) 750 MG tablet, Take 1 tablet (750 mg total) by mouth 3 (three) times daily., Disp: 90 tablet, Rfl: 0 .  Multiple  Vitamin (MULTIVITAMIN) tablet, Take 1 tablet by mouth 2 (two) times daily., Disp: , Rfl:  .  oxyCODONE (OXY IR/ROXICODONE) 5 MG immediate release tablet, Take 1 tablet (5 mg total) by mouth every 6 (six) hours as needed for up to 5 days for moderate pain or severe pain., Disp: 20 tablet, Rfl: 0 .  spironolactone (ALDACTONE) 25 MG tablet, Take 1 tablet (25 mg total) by mouth daily., Disp: 30 tablet, Rfl: 3 .  valsartan (DIOVAN) 80 MG tablet, Take 1 tablet (80 mg total) by mouth daily., Disp: 90 tablet, Rfl: 0 .  XARELTO 20 MG TABS tablet, TAKE 1 TABLET (20 MG TOTAL) BY MOUTH DAILY WITH SUPPER FOR 30 DAYS. (Patient taking differently: Take 20 mg by mouth daily with supper. ), Disp: 90 tablet, Rfl: 1 .  furosemide (LASIX) 80 MG tablet, Take 1 tablet (80 mg total) by mouth daily for 30 days., Disp: 30 tablet, Rfl: 3 .  predniSONE (DELTASONE) 20 MG tablet, 2 po at sametime daily for 5 days (Patient not taking: Reported on 08/27/2019), Disp: 10 tablet, Rfl: 0   PHYSICAL EXAM SECTION: 1) BP (!) 142/95   Pulse 92   Temp (!) 97.5 F (36.4 C)   Ht 6\' 3"  (1.905 m)   Wt (!) 385 lb (174.6 kg)   BMI 48.12 kg/m   Body mass index is 48.12 kg/m. General appearance: Well-developed well-nourished no gross deformities  2) Cardiovascular normal pulse and perfusion in the right upper extremity normal color without edema  3) Neurologically deep tendon reflexes are equal and normal, no sensation loss or deficits no pathologic reflexes  4) Psychological: Awake alert and oriented x3 mood and affect normal  5) Skin no lacerations or ulcerations no nodularity no palpable masses, no erythema or nodularity  6) Musculoskeletal:   Anterior incision over the right shoulder looks normal there is no warmth or redness to the skin he has no active abduction flexion or external rotation his passive external rotation is normal but painful it is hard to pick his arm up to abduction or flexion because of pain I could not test  his rotator cuff  Stability could not be tested   MEDICAL DECISION SECTION:  Encounter Diagnoses  Name Primary?  . Chronic right shoulder pain   . Body mass index 40.0-44.9, adult ([place]) Yes  . Body mass index 45.0-49.9, adult ([place])   . Morbid obesity ([place])    The patient meets the AMA guidelines for Morbid (severe) obesity with a BMI > 40.0 and I have recommended weight loss.  Imaging The hospital image shows a humeral head arthroplasty Only with very thin glenohumeral joint but there is not appear to be any erosion that appears to be normal although it 15 years may be developing glenoid arthritis and with a history of sepsis could be infected joint  Plan:  (Rx., Inj., surg., Frx, MRI/CT, XR:2)  He has several medical problems he is on Xarelto he has atrial fibrillation diabetes hypertension obesity  I am referring him to a shoulder specialist for definitive management.  Although does not appear to be infected now may have had an episode of septic arthritis with periprosthetic infection    4:56 PM Arther Abbott, MD  08/27/2019

## 2019-08-30 ENCOUNTER — Other Ambulatory Visit: Payer: Self-pay

## 2019-08-30 ENCOUNTER — Encounter: Payer: Self-pay | Admitting: Family Medicine

## 2019-08-30 ENCOUNTER — Ambulatory Visit (INDEPENDENT_AMBULATORY_CARE_PROVIDER_SITE_OTHER): Payer: BC Managed Care – PPO | Admitting: Family Medicine

## 2019-08-30 VITALS — BP 118/65 | HR 100 | Temp 98.6°F | Resp 20 | Ht 75.0 in | Wt 386.0 lb

## 2019-08-30 DIAGNOSIS — D508 Other iron deficiency anemias: Secondary | ICD-10-CM | POA: Diagnosis not present

## 2019-08-30 DIAGNOSIS — E119 Type 2 diabetes mellitus without complications: Secondary | ICD-10-CM

## 2019-08-30 DIAGNOSIS — E1159 Type 2 diabetes mellitus with other circulatory complications: Secondary | ICD-10-CM | POA: Diagnosis not present

## 2019-08-30 DIAGNOSIS — M25511 Pain in right shoulder: Secondary | ICD-10-CM

## 2019-08-30 DIAGNOSIS — G8929 Other chronic pain: Secondary | ICD-10-CM

## 2019-08-30 DIAGNOSIS — I152 Hypertension secondary to endocrine disorders: Secondary | ICD-10-CM

## 2019-08-30 DIAGNOSIS — I10 Essential (primary) hypertension: Secondary | ICD-10-CM

## 2019-08-30 DIAGNOSIS — Z6841 Body Mass Index (BMI) 40.0 and over, adult: Secondary | ICD-10-CM

## 2019-08-30 DIAGNOSIS — D649 Anemia, unspecified: Secondary | ICD-10-CM | POA: Insufficient documentation

## 2019-08-30 NOTE — Patient Instructions (Signed)
Shoulder Pain Many things can cause shoulder pain, including:  An injury.  Moving the shoulder in the same way again and again (overuse).  Joint pain (arthritis). Pain can come from:  Swelling and irritation (inflammation) of any part of the shoulder.  An injury to the shoulder joint.  An injury to: ? Tissues that connect muscle to bone (tendons). ? Tissues that connect bones to each other (ligaments). ? Bones. Follow these instructions at home: Watch for changes in your symptoms. Let your doctor know about them. Follow these instructions to help with your pain. If you have a sling:  Wear the sling as told by your doctor. Remove it only as told by your doctor.  Loosen the sling if your fingers: ? Tingle. ? Become numb. ? Turn cold and blue.  Keep the sling clean.  If the sling is not waterproof: ? Do not let it get wet. ? Take the sling off when you shower or bathe. Managing pain, stiffness, and swelling   If told, put ice on the painful area: ? Put ice in a plastic bag. ? Place a towel between your skin and the bag. ? Leave the ice on for 20 minutes, 2-3 times a day. Stop putting ice on if it does not help with the pain.  Squeeze a soft ball or a foam pad as much as possible. This prevents swelling in the shoulder. It also helps to strengthen the arm. General instructions  Take over-the-counter and prescription medicines only as told by your doctor.  Keep all follow-up visits as told by your doctor. This is important. Contact a doctor if:  Your pain gets worse.  Medicine does not help your pain.  You have new pain in your arm, hand, or fingers. Get help right away if:  Your arm, hand, or fingers: ? Tingle. ? Are numb. ? Are swollen. ? Are painful. ? Turn white or blue. Summary  Shoulder pain can be caused by many things. These include injury, moving the shoulder in the same away again and again, and joint pain.  Watch for changes in your symptoms.  Let your doctor know about them.  This condition may be treated with a sling, ice, and pain medicine.  Contact your doctor if the pain gets worse or you have new pain. Get help right away if your arm, hand, or fingers tingle or get numb, swollen, or painful.  Keep all follow-up visits as told by your doctor. This is important. This information is not intended to replace advice given to you by your health care provider. Make sure you discuss any questions you have with your health care provider. Document Released: 03/15/2008 Document Revised: 04/11/2018 Document Reviewed: 04/11/2018 Elsevier Patient Education  2020 Elsevier Inc.  

## 2019-08-31 LAB — ANEMIA PROFILE B
Basophils Absolute: 0.1 10*3/uL (ref 0.0–0.2)
Basos: 1 %
EOS (ABSOLUTE): 0.2 10*3/uL (ref 0.0–0.4)
Eos: 3 %
Ferritin: 364 ng/mL (ref 30–400)
Folate: >20 ng/mL (ref >3.0)
Hematocrit: 39 % (ref 37.5–51.0)
Hemoglobin: 13.3 g/dL (ref 13.0–17.7)
Immature Grans (Abs): 0.1 10*3/uL (ref 0.0–0.1)
Immature Granulocytes: 1 %
Iron Saturation: 29 % (ref 15–55)
Iron: 81 ug/dL (ref 38–169)
Lymphocytes Absolute: 2.2 10*3/uL (ref 0.7–3.1)
Lymphs: 28 %
MCH: 29.4 pg (ref 26.6–33.0)
MCHC: 34.1 g/dL (ref 31.5–35.7)
MCV: 86 fL (ref 79–97)
Monocytes Absolute: 0.8 10*3/uL (ref 0.1–0.9)
Monocytes: 10 %
Neutrophils Absolute: 4.6 10*3/uL (ref 1.4–7.0)
Neutrophils: 57 %
Platelets: 226 10*3/uL (ref 150–450)
RBC: 4.53 x10E6/uL (ref 4.14–5.80)
RDW: 13.1 % (ref 11.6–15.4)
Retic Ct Pct: 0.8 % (ref 0.6–2.6)
Total Iron Binding Capacity: 280 ug/dL (ref 250–450)
UIBC: 199 ug/dL (ref 111–343)
Vitamin B-12: 1522 pg/mL — ABNORMAL HIGH (ref 232–1245)
WBC: 7.9 10*3/uL (ref 3.4–10.8)

## 2019-09-01 NOTE — Progress Notes (Signed)
Subjective:  Patient ID: Richard Crawford, male    DOB: 12-05-1955, 63 y.o.   MRN: [id number]  Patient Care Team: Baruch Gouty, FNP as PCP - General (Family Medicine) Minus Breeding, MD as PCP - Cardiology (Cardiology)   Chief Complaint:  Medical Management of Chronic Issues (3 [place] ), Hyperlipidemia, Hypertension, and Diabetes   HPI: Richard Crawford is a 63 y.o. male presenting on 08/30/2019 for Medical Management of Chronic Issues (3 [place] ), Hyperlipidemia, Hypertension, and Diabetes  1. Hypertension associated with diabetes ([place]) Blood pressure has been well controlled. No dizziness, headaches, chest pain, shortness of breath, confusion, weakness, palpitations, or leg swelling. Compliant with medications without associated side effects. Has not been exercising and tries to watch diet.   2. Other iron deficiency anemia Hgb and Hct were low with last blood draw. Pt denies fatigue, weakness, shortness of breath, palpitations, abnormal bleeding or bruising, or pallor. No dizziness or syncope.   3. Type 2 diabetes mellitus without complication, without long-term current use of insulin ([place]) Doing well. Blood sugars have been in the low 100s mostly. No polyuria, polyphagia, or polydipsia. Compliant with medications without associated side effects.  4. Morbid obesity with BMI of 50.0-59.9, adult ([place]) Does not exercise on a regular basis. Does try to watch what he eats but does eat what he likes and wants.   5. Chronic right shoulder pain Ongoing and worsening over the last several weeks. Was seen in the ED for the pain and at ortho for the pain. States the pain is not getting better and he has limited ROM due to the pain. States he went to the ortho appointment and was prescribed oxycodone and referred to a shoulder specialist. Pt states the oxycodone are like "syrup" on ice cream and he needs something stronger for the pain. Pt aware he needs to follow up with the shoulder specialist to  determine his treatment plan. Pt agrees and will follow up. Pt aware he can be referred to pain management if he does not get resolution of his symptoms.      Relevant past medical, surgical, family, and social history reviewed and updated as indicated.  Allergies and medications reviewed and updated. Date reviewed: Chart in Epic.   Past Medical History:  Diagnosis Date  . Anxiety   . Arthritis    left shoulder  . Atrial fibrillation ([place])   . Cancer ([place]) 1989   thyroid  . Cellulitis   . Chronic heart failure with preserved ejection fraction ([place])   . Diabetes mellitus without complication ([place])   . Hemorrhoids    external  . Hx of colonic polyps   . Hyperlipidemia   . Hypertension   . Migraine   . Papillary thyroid carcinoma ([place])   . Postoperative hypothyroidism   . Primary osteoarthritis of left shoulder   . Sleep apnea   . Venous stasis dermatitis of both lower extremities     Past Surgical History:  Procedure Laterality Date  . ANKLE FUSION    . KNEE ARTHROSCOPY    . SHOULDER ARTHROSCOPY    . TOTAL SHOULDER REPLACEMENT Right     Social History   Socioeconomic History  . Marital status: Married    Spouse name: Not on file  . Number of children: Not on file  . Years of education: Not on file  . Highest education level: Not on file  Occupational History  . Not on file  Social Needs  . Financial resource strain:  Not on file  . Food insecurity    Worry: Not on file    Inability: Not on file  . Transportation needs    Medical: Not on file    Non-medical: Not on file  Tobacco Use  . Smoking status: Never Smoker  . Smokeless tobacco: Never Used  Substance and Sexual Activity  . Alcohol use: Not Currently  . Drug use: Never  . Sexual activity: Not on file  Lifestyle  . Physical activity    Days per week: Not on file    Minutes per session: Not on file  . Stress: Not on file  Relationships  . Social Herbalist on phone: Not on file     Gets together: Not on file    Attends religious service: Not on file    Active member of club or organization: Not on file    Attends meetings of clubs or organizations: Not on file    Relationship status: Not on file  . Intimate partner violence    Fear of current or ex partner: Not on file    Emotionally abused: Not on file    Physically abused: Not on file    Forced sexual activity: Not on file  Other Topics Concern  . Not on file  Social History Narrative  . Not on file    Outpatient Encounter Medications as of 08/30/2019  Medication Sig  . acetaminophen (TYLENOL) 325 MG tablet Take 2 tablets (650 mg total) by mouth every 6 (six) hours as needed for mild pain, fever or headache (or Fever >/= 101).  Marland Kitchen atorvastatin (LIPITOR) 20 MG tablet TAKE 1 TABLET BY MOUTH EVERY DAY  . diclofenac sodium (VOLTAREN) 1 % GEL Apply 2 g topically 4 (four) times daily.  Marland Kitchen diltiazem (CARDIZEM CD) 360 MG 24 hr capsule Take 1 capsule (360 mg total) by mouth daily.  . furosemide (LASIX) 80 MG tablet Take 1 tablet (80 mg total) by mouth daily for 30 days.  Marland Kitchen gabapentin (NEURONTIN) 100 MG capsule Take 1 capsule (100 mg total) by mouth 3 (three) times daily.  Marland Kitchen levothyroxine (SYNTHROID) 200 MCG tablet Take 1 tablet (200 mcg total) by mouth daily before breakfast.  . levothyroxine (SYNTHROID) 50 MCG tablet Take 1 tablet (50 mcg total) by mouth daily before breakfast.  . metFORMIN (GLUCOPHAGE-XR) 750 MG 24 hr tablet TAKE 1 TABLET BY MOUTH EVERY DAY WITH BREAKFAST  . methocarbamol (ROBAXIN) 750 MG tablet Take 1 tablet (750 mg total) by mouth 3 (three) times daily.  . Multiple Vitamin (MULTIVITAMIN) tablet Take 1 tablet by mouth 2 (two) times daily.  Marland Kitchen oxyCODONE (OXY IR/ROXICODONE) 5 MG immediate release tablet Take 1 tablet (5 mg total) by mouth every 6 (six) hours as needed for up to 5 days for moderate pain or severe pain.  Marland Kitchen spironolactone (ALDACTONE) 25 MG tablet Take 1 tablet (25 mg total) by mouth daily.   . valsartan (DIOVAN) 80 MG tablet Take 1 tablet (80 mg total) by mouth daily.  Alveda Reasons 20 MG TABS tablet TAKE 1 TABLET (20 MG TOTAL) BY MOUTH DAILY WITH SUPPER FOR 30 DAYS. (Patient taking differently: Take 20 mg by mouth daily with supper. )  . [DISCONTINUED] predniSONE (DELTASONE) 20 MG tablet 2 po at sametime daily for 5 days (Patient not taking: Reported on 08/27/2019)   No facility-administered encounter medications on file as of 08/30/2019.     Allergies  Allergen Reactions  . Morphine And Related Nausea And Vomiting  Review of Systems  Constitutional: Positive for activity change. Negative for appetite change, chills, diaphoresis, fatigue, fever and unexpected weight change.  HENT: Negative.   Eyes: Negative.  Negative for photophobia and visual disturbance.  Respiratory: Negative for cough, chest tightness and shortness of breath.   Cardiovascular: Negative for chest pain, palpitations and leg swelling.  Gastrointestinal: Negative for abdominal distention, abdominal pain, anal bleeding, blood in stool, constipation, diarrhea, nausea, rectal pain and vomiting.  Endocrine: Negative.  Negative for cold intolerance, heat intolerance, polydipsia, polyphagia and polyuria.  Genitourinary: Negative for decreased urine volume, difficulty urinating, dysuria, frequency, hematuria and urgency.  Musculoskeletal: Positive for arthralgias and myalgias. Negative for back pain, gait problem, joint swelling, neck pain and neck stiffness.  Skin: Negative.  Negative for color change and pallor.  Allergic/Immunologic: Negative.   Neurological: Negative for dizziness, tremors, seizures, syncope, facial asymmetry, speech difficulty, weakness, light-headedness, numbness and headaches.  Hematological: Negative.  Does not bruise/bleed easily.  Psychiatric/Behavioral: Negative for confusion, hallucinations, sleep disturbance and suicidal ideas.  All other systems reviewed and are negative.        Objective:  BP 118/65   Pulse 100   Temp 98.6 F (37 C)   Resp 20   Ht 6\' 3"  (1.905 m)   Wt (!) 386 lb (175.1 kg)   SpO2 96%   BMI 48.25 kg/m    Wt Readings from Last 3 Encounters:  08/30/19 (!) 386 lb (175.1 kg)  08/27/19 (!) 385 lb (174.6 kg)  08/07/19 (!) 404 lb 4.8 oz (183.4 kg)    Physical Exam Vitals signs and nursing note reviewed.  Constitutional:      General: He is not in acute distress.    Appearance: Normal appearance. He is well-developed and well-groomed. He is morbidly obese. He is not ill-appearing, toxic-appearing or diaphoretic.  HENT:     Head: Normocephalic and atraumatic.     Jaw: There is normal jaw occlusion.     Right Ear: Hearing normal.     Left Ear: Hearing normal.     Nose: Nose normal.     Mouth/Throat:     Lips: Pink.     Mouth: Mucous membranes are moist.     Pharynx: Oropharynx is clear. Uvula midline.  Eyes:     General: Lids are normal.     Extraocular Movements: Extraocular movements intact.     Conjunctiva/sclera: Conjunctivae normal.     Pupils: Pupils are equal, round, and reactive to light.  Neck:     Musculoskeletal: Normal range of motion and neck supple.     Thyroid: No thyroid mass, thyromegaly or thyroid tenderness.     Vascular: No carotid bruit or JVD.     Trachea: Trachea and phonation normal.  Cardiovascular:     Rate and Rhythm: Normal rate. Rhythm irregularly irregular.     Chest Wall: PMI is not displaced.     Pulses: Normal pulses.     Heart sounds: Normal heart sounds. No murmur. No friction rub. No gallop.   Pulmonary:     Effort: Pulmonary effort is normal. No respiratory distress.     Breath sounds: Normal breath sounds. No wheezing.  Abdominal:     General: Bowel sounds are normal. There is no distension or abdominal bruit.     Palpations: Abdomen is soft. There is no hepatomegaly or splenomegaly.     Tenderness: There is no abdominal tenderness. There is no right CVA tenderness or left CVA tenderness.      Hernia:  No hernia is present.  Musculoskeletal:     Right shoulder: He exhibits decreased range of motion, tenderness, pain and decreased strength. He exhibits no bony tenderness, no swelling, no effusion, no crepitus, no deformity, no laceration, no spasm and normal pulse.     Right elbow: Normal.    Cervical back: Normal.     Right lower leg: No edema.     Left lower leg: 1+ Edema (chronic from previous surgery) present.     Comments: Pt guarding right shoulder and has it pulled tight to his side. Does move some during exam when talking about other issues. Pain with any ROM attempts. Cap refill, pulses, and sensation intact distally.   Lymphadenopathy:     Cervical: No cervical adenopathy.  Skin:    General: Skin is warm and dry.     Capillary Refill: Capillary refill takes less than 2 seconds.     Coloration: Skin is not cyanotic, jaundiced or pale.     Findings: No rash.  Neurological:     General: No focal deficit present.     Mental Status: He is alert and oriented to person, place, and time.     Cranial Nerves: Cranial nerves are intact. No cranial nerve deficit.     Sensory: Sensation is intact. No sensory deficit.     Motor: Motor function is intact. No weakness.     Coordination: Coordination is intact. Coordination normal.     Gait: Gait is intact. Gait normal.     Deep Tendon Reflexes: Reflexes are normal and symmetric. Reflexes normal.  Psychiatric:        Attention and Perception: Attention and perception normal.        Mood and Affect: Mood and affect normal.        Speech: Speech normal.        Behavior: Behavior normal. Behavior is cooperative.        Thought Content: Thought content normal.        Cognition and Memory: Cognition and memory normal.        Judgment: Judgment normal.     Results for orders placed or performed in visit on 08/30/19  Anemia Profile  Result Value Ref Range   Total Iron Binding Capacity 280 250 - 450 ug/dL   UIBC 199 111 - 343 ug/dL    Iron 81 38 - 169 ug/dL   Iron Saturation 29 15 - 55 %   Ferritin 364 30 - 400 ng/mL   Vitamin B-12 1,522 (H) 232 - 1,245 pg/mL   Folate >20.0 >3.0 ng/mL   WBC 7.9 3.4 - 10.8 x10E3/uL   RBC 4.53 4.14 - 5.80 x10E6/uL   Hemoglobin 13.3 13.0 - 17.7 g/dL   Hematocrit 39.0 37.5 - 51.0 %   MCV 86 79 - 97 fL   MCH 29.4 26.6 - 33.0 pg   MCHC 34.1 31.5 - 35.7 g/dL   RDW 13.1 11.6 - 15.4 %   Platelets 226 150 - 450 x10E3/uL   Neutrophils 57 Not Estab. %   Lymphs 28 Not Estab. %   Monocytes 10 Not Estab. %   Eos 3 Not Estab. %   Basos 1 Not Estab. %   Neutrophils Absolute 4.6 1.4 - 7.0 x10E3/uL   Lymphocytes Absolute 2.2 0.7 - 3.1 x10E3/uL   Monocytes Absolute 0.8 0.1 - 0.9 x10E3/uL   EOS (ABSOLUTE) 0.2 0.0 - 0.4 x10E3/uL   Basophils Absolute 0.1 0.0 - 0.2 x10E3/uL   Immature Granulocytes 1 Not Estab. %  Immature Grans (Abs) 0.1 0.0 - 0.1 x10E3/uL   Retic Ct Pct 0.8 0.6 - 2.6 %       Pertinent labs & imaging results that were available during my care of the patient were reviewed by me and considered in my medical decision making.  Assessment & Plan:  Hosey was seen today for medical management of chronic issues, hyperlipidemia, hypertension and diabetes.  Diagnoses and all orders for this visit:  Hypertension associated with diabetes ([place]) Well controlled. No changes in regimen. Diet and exercise encouraged. DASH diet.  Continue medications as prescribed.   Other iron deficiency anemia No abnormal bleeding or bruising. Will recheck today.  -     CBC with Differential/Platelet -     Anemia Profile  Type 2 diabetes mellitus without complication, without long-term current use of insulin ([facility]) Well controlled. Labs due 09/27/2019. Pt will return to have completed.   Morbid obesity with BMI of 50.0-59.9, adult ([place]) Diet and exercise encouraged.   Chronic right shoulder pain Keep follow up appointment with shoulder specialist. Call the office if you have not received an  appointment by Monday.    Total time spent with patient 45 mintues.  Greater than 50% of encounter spent in coordination of care/counseling.  Continue all other maintenance medications.  Follow up plan: Return in about 2 months (around 10/30/2019), or if symptoms worsen or fail to improve, for DM.  Continue healthy lifestyle choices, including diet (rich in fruits, vegetables, and lean proteins, and low in salt and simple carbohydrates) and exercise (at least 30 minutes of moderate physical activity daily).  Educational handout given for shoulder.   The above assessment and management plan was discussed with the patient. The patient verbalized understanding of and has agreed to the management plan. Patient is aware to call the clinic if they develop any new symptoms or if symptoms persist or worsen. Patient is aware when to return to the clinic for a follow-up visit. Patient educated on when it is appropriate to go to the emergency department.   Monia Pouch, FNP-C [facility] [phone]

## 2019-09-04 ENCOUNTER — Other Ambulatory Visit: Payer: Self-pay | Admitting: Family Medicine

## 2019-09-04 DIAGNOSIS — I152 Hypertension secondary to endocrine disorders: Secondary | ICD-10-CM

## 2019-09-04 DIAGNOSIS — E1159 Type 2 diabetes mellitus with other circulatory complications: Secondary | ICD-10-CM

## 2019-09-05 ENCOUNTER — Other Ambulatory Visit: Payer: Self-pay

## 2019-09-05 ENCOUNTER — Telehealth: Payer: Self-pay | Admitting: *Deleted

## 2019-09-05 ENCOUNTER — Ambulatory Visit: Payer: BC Managed Care – PPO | Admitting: Orthopedic Surgery

## 2019-09-05 ENCOUNTER — Encounter: Payer: Self-pay | Admitting: Orthopedic Surgery

## 2019-09-05 ENCOUNTER — Telehealth: Payer: Self-pay

## 2019-09-05 DIAGNOSIS — M25511 Pain in right shoulder: Secondary | ICD-10-CM | POA: Diagnosis not present

## 2019-09-05 DIAGNOSIS — Z96611 Presence of right artificial shoulder joint: Secondary | ICD-10-CM | POA: Diagnosis not present

## 2019-09-05 MED ORDER — DIAZEPAM 10 MG PO TABS: ORAL_TABLET | ORAL | 0 refills | Status: DC

## 2019-09-05 NOTE — Telephone Encounter (Signed)
I called the patient and scheduled him for Monday morning at 10 with Dr. Junius Roads.

## 2019-09-05 NOTE — Telephone Encounter (Signed)
Please advise 

## 2019-09-05 NOTE — Progress Notes (Signed)
Office Visit Note   Patient: Richard Crawford           Date of Birth: Jul 28, 1956           MRN: [id number] Visit Date: 09/05/2019 Requested by: Richard Crawford, [address] [place],  [place] [id number] PCP: Richard Gouty, FNP  Subjective: Chief Complaint  Patient presents with  . Right Shoulder - Pain    HPI: Richard Crawford is a patient with right shoulder pain.  He was recently hospitalized for "sepsis".  Received 5 days of IV antibiotics.  Was having fevers chills and body aches before that.  This was attributed to a tick bite.  He improved but since he has been off the antibiotics he has noted some recurrence in the shoulder pain.  Denies any fevers or chills.  The patient does have bilateral total knee replacements as well as left ankle fusion.  The knee replacements were done about 10 years or so ago.  [place] in [place] hometown.  He is left-hand dominant.  Has known left shoulder arthritis as well.              ROS: All systems reviewed are negative as they relate to the chief complaint within the history of present illness.  Patient denies  fevers or chills.   Assessment & Plan: Visit Diagnoses:  1. Acute pain of right shoulder   2. Presence of right artificial shoulder joint     Plan: Impression is right shoulder pain with a lot of focal localized pain in the deltoid region.  Obvious concern here would be closed space infection within that shoulder joint.  The knees themselves are not warm and have no effusion.  He has been off antibiotics now for about 3 weeks.  Had negative blood cultures.  Concern here would be for any potential seeding of an infection in his shoulder to the knees.  Plain radiographs of the shoulder are pretty unremarkable.  Plan is MRI of the shoulder to evaluate both deltoid atrophy and lack of function along with potential infection.  He is not having any neck pain sudden think this is radicular in nature.  Viral brachial  neuritis is another possibility with the focal localized pain in the shoulder joint.  That would go along more with the weakness.  Plan also to draw CBC differential sed rate C-reactive protein today in addition to the MRI scan.  Also want a get him set up for right shoulder aspiration early next week.  We will see what this shows.  May also need to get nerve study to evaluate that deltoid if the deltoid is not firing more convincingly on his return visit.  Follow-Up Instructions: Return for after MRI.   Orders:  Orders Placed This Encounter  Procedures  . MR SHOULDER RIGHT WO CONTRAST  . CBC with Differential  . Sed Rate (ESR)  . C-reactive protein   Meds ordered this encounter  Medications  . diazepam (VALIUM) 10 MG tablet    Sig: Take 1 30 minutes before MRI scan for anxiety.    Dispense:  3 tablet    Refill:  0      Procedures: No procedures performed   Clinical Data: No additional findings.  Objective: Vital Signs: There were no vitals taken for this visit.  Physical Exam:   Constitutional: Patient appears well-developed HEENT:  Head: Normocephalic Eyes:EOM are normal Neck: Normal range of motion Cardiovascular: Normal rate Pulmonary/chest: Effort normal  Neurologic: Patient is alert Skin: Skin is warm Psychiatric: Patient has normal mood and affect    Ortho Exam: Ortho exam demonstrates left shoulder diminished range of motion but functional deltoid.  Both knees have no effusion and no warmth.  Right shoulder has pretty limited range of motion which is painful.  Having a hard time getting the deltoid to fire.  Subscap strength is strong.  Biceps triceps EPL FPL interosseous strength also good.  Radial pulses intact.  Mild to take much in the way of warmth in the right shoulder region versus left.  No lymphadenopathy in the right shoulder girdle region versus left.  Specialty Comments:  No specialty comments available.  Imaging: No results found.   PMFS  History: Patient Active Problem List   Diagnosis Date Noted  . Chronic right shoulder pain 08/30/2019  . Absolute anemia 08/30/2019  . Fever 08/04/2019  . Right shoulder pain 08/04/2019  . Sepsis ([place]) 08/04/2019  . Diabetic mononeuropathy associated with type 2 diabetes mellitus ([place]) 06/25/2019  . Low libido 06/25/2019  . Hypertensive retinopathy of both eyes 03/21/2019  . Permanent atrial fibrillation ([place]) 03/09/2019  . Fatigue 03/09/2019  . Chronic diastolic HF (heart failure) ([place]) 03/09/2019  . Educated about COVID-19 virus infection 03/09/2019  . Morbid obesity with BMI of 50.0-59.9, adult ([place]) 12/15/2018  . History of colonic polyps 12/15/2018  . Skin abnormalities 12/15/2018  . Generalized anxiety disorder 06/29/2018  . Chronic heart failure with preserved ejection fraction ([place]) 05/01/2018  . Hyperlipidemia associated with type 2 diabetes mellitus ([place]) 02/08/2018  . Venous stasis dermatitis of both lower extremities 01/05/2018  . Obstructive sleep apnea 01/05/2018  . Papillary thyroid carcinoma ([place]) 11/29/2017  . Primary osteoarthritis of left shoulder 11/15/2017  . Combined forms of age-related cataract of both eyes 11/14/2017  . Myopia with astigmatism and presbyopia, bilateral 11/14/2017  . Hypertension associated with diabetes ([place]) 09/26/2017  . Corns and callosity 09/26/2017  . Postoperative hypothyroidism 09/26/2017  . Type 2 diabetes mellitus without complication, without long-term current use of insulin ([place]) 09/26/2017  . Rapid atrial fibrillation ([place]) 09/16/2016   Past Medical History:  Diagnosis Date  . Anxiety   . Arthritis    left shoulder  . Atrial fibrillation ([place])   . Cancer ([place]) 1989   thyroid  . Cellulitis   . Chronic heart failure with preserved ejection fraction ([place])   . Diabetes mellitus without complication ([place])   . Hemorrhoids    external  . Hx of colonic polyps   . Hyperlipidemia   . Hypertension   . Migraine   . Papillary  thyroid carcinoma ([place])   . Postoperative hypothyroidism   . Primary osteoarthritis of left shoulder   . Sleep apnea   . Venous stasis dermatitis of both lower extremities     Family History  Problem Relation Age of Onset  . Cancer Mother        breast, stomach, ovarian  . Hypertension Mother   . Dementia Mother   . Heart disease Father     Past Surgical History:  Procedure Laterality Date  . ANKLE FUSION    . KNEE ARTHROSCOPY    . SHOULDER ARTHROSCOPY    . TOTAL SHOULDER REPLACEMENT Right    Social History   Occupational History  . Not on file  Tobacco Use  . Smoking status: Never Smoker  . Smokeless tobacco: Never Used  Substance and Sexual Activity  . Alcohol use: Not Currently  . Drug use: Never  .  Sexual activity: Not on file

## 2019-09-05 NOTE — Telephone Encounter (Signed)
Dr Marlou Sa would like patient to be worked in with Dr Junius Roads early next week for right shoulder aspiration. Was not sure if you would want extra time for this. Can you please reach out to him to get scheduled? Dr Marlou Sa is ordering a STAT MRI to r/o for joint infection to be done by Monday.

## 2019-09-05 NOTE — Telephone Encounter (Signed)
20 minutes should be fine.

## 2019-09-06 LAB — CBC WITH DIFFERENTIAL/PLATELET
Absolute Monocytes: 1008 cells/uL — ABNORMAL HIGH (ref 200–950)
Basophils Absolute: 57 cells/uL (ref 0–200)
Basophils Relative: 0.8 %
Eosinophils Absolute: 220 cells/uL (ref 15–500)
Eosinophils Relative: 3.1 %
HCT: 37.1 % — ABNORMAL LOW (ref 38.5–50.0)
Hemoglobin: 12.5 g/dL — ABNORMAL LOW (ref 13.2–17.1)
Lymphs Abs: 2166 cells/uL (ref 850–3900)
MCH: 29.5 pg (ref 27.0–33.0)
MCHC: 33.7 g/dL (ref 32.0–36.0)
MCV: 87.5 fL (ref 80.0–100.0)
MPV: 10.2 fL (ref 7.5–12.5)
Monocytes Relative: 14.2 %
Neutro Abs: 3649 cells/uL (ref 1500–7800)
Neutrophils Relative %: 51.4 %
Platelets: 203 10*3/uL (ref 140–400)
RBC: 4.24 10*6/uL (ref 4.20–5.80)
RDW: 12.9 % (ref 11.0–15.0)
Total Lymphocyte: 30.5 %
WBC: 7.1 10*3/uL (ref 3.8–10.8)

## 2019-09-06 LAB — SEDIMENTATION RATE: Sed Rate: 25 mm/h — ABNORMAL HIGH (ref 0–20)

## 2019-09-06 LAB — C-REACTIVE PROTEIN: CRP: 3.9 mg/L (ref <8.0)

## 2019-09-07 ENCOUNTER — Other Ambulatory Visit (HOSPITAL_COMMUNITY): Payer: BC Managed Care – PPO

## 2019-09-09 ENCOUNTER — Ambulatory Visit (HOSPITAL_COMMUNITY)
Admission: RE | Admit: 2019-09-09 | Discharge: 2019-09-09 | Disposition: A | Discharge status: Home / Self Care | Payer: BC Managed Care – PPO | Source: Ambulatory Visit | Attending: Orthopedic Surgery | Admitting: Orthopedic Surgery

## 2019-09-09 ENCOUNTER — Encounter (HOSPITAL_COMMUNITY): Payer: Self-pay

## 2019-09-09 DIAGNOSIS — Z96611 Presence of right artificial shoulder joint: Secondary | ICD-10-CM

## 2019-09-09 NOTE — Progress Notes (Signed)
Labs neg for infxn pls claal thx

## 2019-09-10 ENCOUNTER — Other Ambulatory Visit: Payer: Self-pay

## 2019-09-10 ENCOUNTER — Ambulatory Visit: Payer: Self-pay

## 2019-09-10 ENCOUNTER — Encounter: Payer: Self-pay | Admitting: Family Medicine

## 2019-09-10 ENCOUNTER — Ambulatory Visit: Payer: BC Managed Care – PPO | Admitting: Family Medicine

## 2019-09-10 DIAGNOSIS — M25511 Pain in right shoulder: Secondary | ICD-10-CM | POA: Diagnosis not present

## 2019-09-10 NOTE — Progress Notes (Signed)
Subjective: Patient is here for ultrasound-guided right glenohumeral aspiration.  Severe pain without injury.  Dr. Marlou Sa would like to have aspiration to rule out infection.  No more fevers since hospitalization.  ESR/CRP normal.   Objective:  Unable to actively abduct, but good passive ROM.  Diagnostics:  No abscess seen.  Rotator cuff not well-visualized due to body habitus.  Possibly a small effusion seen in posterior glenohumeral joint.  Procedure: Ultrasound-guided right glenohumeral aspiration: After sterile prep with Betadine, injected 8 cc 1% lidocaine without epinephrine using a 22-gauge spinal needle, passing the needle into the glenohumeral joint.  Lidocaine seen entering joint capsule, but unable to aspirate any fluid.  Will notify Dr. Marlou Sa.  He'll proceed with open MRI tomorrow.

## 2019-09-10 NOTE — Telephone Encounter (Signed)
Pt is scheduled at [facility] on Wed Dec 2 at 7:45am check in for 815am appt. Pt is aware of appt.

## 2019-09-13 LAB — HM DIABETES EYE EXAM

## 2019-09-14 ENCOUNTER — Encounter: Payer: Self-pay | Admitting: Family Medicine

## 2019-09-14 ENCOUNTER — Telehealth: Payer: Self-pay | Admitting: Family Medicine

## 2019-09-14 NOTE — Telephone Encounter (Signed)
Pt advised pain medications can't be filled outside of an office visit and he would ntbs to discuss pain medication. Pt scheduled 09/19/19 at 12:20 with Monia Pouch.

## 2019-09-17 ENCOUNTER — Other Ambulatory Visit: Payer: Self-pay | Admitting: Family Medicine

## 2019-09-17 DIAGNOSIS — M25511 Pain in right shoulder: Secondary | ICD-10-CM

## 2019-09-17 DIAGNOSIS — G8929 Other chronic pain: Secondary | ICD-10-CM

## 2019-09-17 MED ORDER — PREDNISONE 20 MG PO TABS: ORAL_TABLET | ORAL | 0 refills | Status: DC

## 2019-09-18 ENCOUNTER — Other Ambulatory Visit: Payer: Self-pay | Admitting: *Deleted

## 2019-09-18 MED ORDER — GLUCOSE BLOOD VI STRP: ORAL_STRIP | 3 refills | Status: DC

## 2019-09-18 MED ORDER — PRODIGY LANCETS 28G MISC: 3 refills | Status: DC

## 2019-09-19 ENCOUNTER — Ambulatory Visit: Payer: BC Managed Care – PPO | Admitting: Family Medicine

## 2019-09-19 ENCOUNTER — Ambulatory Visit: Payer: BC Managed Care – PPO | Admitting: Rheumatology

## 2019-09-19 ENCOUNTER — Other Ambulatory Visit: Payer: Self-pay

## 2019-09-19 ENCOUNTER — Encounter: Payer: Self-pay | Admitting: Family Medicine

## 2019-09-19 VITALS — BP 123/74 | HR 99 | Temp 97.8°F | Resp 20 | Ht 75.0 in | Wt 390.0 lb

## 2019-09-19 DIAGNOSIS — M19012 Primary osteoarthritis, left shoulder: Secondary | ICD-10-CM

## 2019-09-19 DIAGNOSIS — E119 Type 2 diabetes mellitus without complications: Secondary | ICD-10-CM

## 2019-09-19 DIAGNOSIS — M25511 Pain in right shoulder: Secondary | ICD-10-CM | POA: Diagnosis not present

## 2019-09-19 DIAGNOSIS — E1141 Type 2 diabetes mellitus with diabetic mononeuropathy: Secondary | ICD-10-CM | POA: Diagnosis not present

## 2019-09-19 DIAGNOSIS — G8929 Other chronic pain: Secondary | ICD-10-CM

## 2019-09-19 DIAGNOSIS — L03116 Cellulitis of left lower limb: Secondary | ICD-10-CM

## 2019-09-19 LAB — GLUCOSE HEMOCUE WAIVED: Glu Hemocue Waived: 170 mg/dL — ABNORMAL HIGH (ref 65–99)

## 2019-09-19 MED ORDER — METHOCARBAMOL 750 MG PO TABS: 750.0000 mg | ORAL_TABLET | Freq: Three times a day (TID) | ORAL | 0 refills | Status: DC

## 2019-09-19 MED ORDER — CELECOXIB 200 MG PO CAPS: 200.0000 mg | ORAL_CAPSULE | Freq: Two times a day (BID) | ORAL | 2 refills | Status: AC

## 2019-09-19 MED ORDER — GABAPENTIN 100 MG PO CAPS: 100.0000 mg | ORAL_CAPSULE | Freq: Three times a day (TID) | ORAL | 3 refills | Status: DC

## 2019-09-19 MED ORDER — DOXYCYCLINE HYCLATE 100 MG PO TABS: 100.0000 mg | ORAL_TABLET | Freq: Two times a day (BID) | ORAL | 0 refills | Status: AC

## 2019-09-19 MED ORDER — SULFAMETHOXAZOLE-TRIMETHOPRIM 800-160 MG PO TABS: 1.0000 | ORAL_TABLET | Freq: Two times a day (BID) | ORAL | 0 refills | Status: AC

## 2019-09-19 NOTE — Patient Instructions (Signed)

## 2019-09-19 NOTE — Progress Notes (Signed)
Subjective:  Patient ID: Richard Crawford, male    DOB: [birth year], 63 y.o.   MRN: [id number]  Patient Care Team: Baruch Gouty, FNP as PCP - General (Family Medicine) Minus Breeding, MD as PCP - Cardiology (Cardiology)   Chief Complaint:  Shoulder Pain (Needs RF on pain meds)   HPI: Richard Crawford is a 63 y.o. male presenting on 09/19/2019 for Shoulder Pain (Needs RF on pain meds)   1. Chronic right shoulder pain 2. Primary osteoarthritis of left shoulder Pt presents today with ongoing right and left shoulder pain. Pt attempted a MRI x 2 and was unable to complete the exam due to pain during the exam. Referral to pain management has been placed but pt has not received an appointment. US guided joint aspiration did not reveal an infection. States he was told he needed to have the MRI completed to determine possible underlying cause. He started the prednisone but feels it is causing his blood sugar to be elevated and he does not wish to continue this. Pt states nothing is helping the pain and it is difficult for him to move his right arm without assisting it with his left arm. No injury, fever, deformity, redness, swelling, or wounds present.   3. Diabetic mononeuropathy associated with type 2 diabetes mellitus ([place]) Reports symptoms are well controlled with gabapentin and he denies associated side effects. States he usually takes the gabapentin at night and this does really well for his symptoms.   4. Type 2 diabetes mellitus without complication, without long-term current use of insulin ([facility]) Pt feels his blood sugar has been elevated due to the ongoing shoulder pain and starting the prednisone. Pt states his sugars have been in the mid 100's for the last week and he is wondering if he still needs the prednisone. Pt also reports he has a crack in his left heel and is concerned about it also because of his diabetes.        Relevant past medical, surgical, family, and social history  reviewed and updated as indicated.  Allergies and medications reviewed and updated. Date reviewed: Chart in Epic.   Past Medical History:  Diagnosis Date  . Anxiety   . Arthritis    left shoulder  . Atrial fibrillation ([place])   . Cancer ([place]) 1989   thyroid  . Cellulitis   . Chronic heart failure with preserved ejection fraction ([place])   . Diabetes mellitus without complication ([place])   . Hemorrhoids    external  . Hx of colonic polyps   . Hyperlipidemia   . Hypertension   . Migraine   . Papillary thyroid carcinoma ([place])   . Postoperative hypothyroidism   . Primary osteoarthritis of left shoulder   . Sleep apnea   . Venous stasis dermatitis of both lower extremities     Past Surgical History:  Procedure Laterality Date  . ANKLE FUSION    . KNEE ARTHROSCOPY    . SHOULDER ARTHROSCOPY    . TOTAL SHOULDER REPLACEMENT Right     Social History   Socioeconomic History  . Marital status: Married    Spouse name: Not on file  . Number of children: Not on file  . Years of education: Not on file  . Highest education level: Not on file  Occupational History  . Not on file  Social Needs  . Financial resource strain: Not on file  . Food insecurity    Worry: Not on file    Inability:  Not on file  . Transportation needs    Medical: Not on file    Non-medical: Not on file  Tobacco Use  . Smoking status: Never Smoker  . Smokeless tobacco: Never Used  Substance and Sexual Activity  . Alcohol use: Not Currently  . Drug use: Never  . Sexual activity: Not on file  Lifestyle  . Physical activity    Days per week: Not on file    Minutes per session: Not on file  . Stress: Not on file  Relationships  . Social Herbalist on phone: Not on file    Gets together: Not on file    Attends religious service: Not on file    Active member of club or organization: Not on file    Attends meetings of clubs or organizations: Not on file    Relationship status: Not on file  .  Intimate partner violence    Fear of current or ex partner: Not on file    Emotionally abused: Not on file    Physically abused: Not on file    Forced sexual activity: Not on file  Other Topics Concern  . Not on file  Social History Narrative  . Not on file    Outpatient Encounter Medications as of 09/19/2019  Medication Sig  . acetaminophen (TYLENOL) 325 MG tablet Take 2 tablets (650 mg total) by mouth every 6 (six) hours as needed for mild pain, fever or headache (or Fever >/= 101).  [place] Kitchen atorvastatin (LIPITOR) 20 MG tablet TAKE 1 TABLET BY MOUTH EVERY DAY  . celecoxib (CELEBREX) 200 MG capsule Take 1 capsule (200 mg total) by mouth 2 (two) times daily.  . diazepam (VALIUM) 10 MG tablet Take 1 30 minutes before MRI scan for anxiety.  . diclofenac sodium (VOLTAREN) 1 % GEL Apply 2 g topically 4 (four) times daily.  [place] Kitchen diltiazem (CARDIZEM CD) 360 MG 24 hr capsule Take 1 capsule (360 mg total) by mouth daily.  [place] Kitchen diltiazem (TIAZAC) 360 MG 24 hr capsule Take 360 mg by mouth daily.  [place] Kitchen doxycycline (VIBRA-TABS) 100 MG tablet Take 1 tablet (100 mg total) by mouth 2 (two) times daily for 10 days. 1 po bid  . furosemide (LASIX) 80 MG tablet Take 1 tablet (80 mg total) by mouth daily for 30 days.  [place] Kitchen gabapentin (NEURONTIN) 100 MG capsule Take 1 capsule (100 mg total) by mouth 3 (three) times daily.  [place] Kitchen glucose blood test strip Check BS daily and as needed Dx E11.9  . levothyroxine (SYNTHROID) 200 MCG tablet Take 1 tablet (200 mcg total) by mouth daily before breakfast.  . levothyroxine (SYNTHROID) 50 MCG tablet Take 1 tablet (50 mcg total) by mouth daily before breakfast.  . metFORMIN (GLUCOPHAGE-XR) 750 MG 24 hr tablet TAKE 1 TABLET BY MOUTH EVERY DAY WITH BREAKFAST  . methocarbamol (ROBAXIN) 750 MG tablet Take 1 tablet (750 mg total) by mouth 3 (three) times daily.  . Multiple Vitamin (MULTIVITAMIN) tablet Take 1 tablet by mouth 2 (two) times daily.  . Prodigy Lancets 28G MISC Check BS daily and as  needed Dx E11.9  . spironolactone (ALDACTONE) 25 MG tablet Take 1 tablet (25 mg total) by mouth daily.  [place] Kitchen sulfamethoxazole-trimethoprim (BACTRIM DS) 800-160 MG tablet Take 1 tablet by mouth 2 (two) times daily for 7 days.  . valsartan (DIOVAN) 80 MG tablet TAKE 1 TABLET BY MOUTH EVERY DAY  . XARELTO 20 MG TABS tablet TAKE 1 TABLET (20 MG TOTAL) BY  MOUTH DAILY WITH SUPPER FOR 30 DAYS. (Patient taking differently: Take 20 mg by mouth daily with supper. )  . [DISCONTINUED] gabapentin (NEURONTIN) 100 MG capsule Take 1 capsule (100 mg total) by mouth 3 (three) times daily.  . [DISCONTINUED] methocarbamol (ROBAXIN) 750 MG tablet Take 1 tablet (750 mg total) by mouth 3 (three) times daily.  . [DISCONTINUED] predniSONE (DELTASONE) 20 MG tablet 2 po at sametime daily for 5 days   No facility-administered encounter medications on file as of 09/19/2019.     Allergies  Allergen Reactions  . Morphine And Related Nausea And Vomiting    Review of Systems  Constitutional: Negative for activity change, appetite change, chills, diaphoresis, fatigue, fever and unexpected weight change.  HENT: Negative.   Eyes: Negative.  Negative for visual disturbance.  Respiratory: Negative for cough, chest tightness and shortness of breath.   Cardiovascular: Negative for chest pain, palpitations and leg swelling.  Gastrointestinal: Negative for abdominal pain, blood in stool, constipation, diarrhea, nausea and vomiting.  Endocrine: Negative.  Negative for polydipsia, polyphagia and polyuria.  Genitourinary: Negative for decreased urine volume, difficulty urinating, dysuria, frequency and urgency.  Musculoskeletal: Positive for arthralgias and myalgias.  Skin: Positive for color change and wound.  Allergic/Immunologic: Negative.   Neurological: Negative for dizziness, tremors, seizures, syncope, facial asymmetry, speech difficulty, weakness, light-headedness, numbness and headaches.  Hematological: Negative.    Psychiatric/Behavioral: Negative for confusion, hallucinations, sleep disturbance and suicidal ideas.  All other systems reviewed and are negative.       Objective:  BP 123/74   Pulse 99   Temp 97.8 F (36.6 C) (Temporal)   Resp 20   Ht 6\' 3"  (1.905 m)   Wt (!) 390 lb (176.9 kg)   SpO2 95%   BMI 48.75 kg/m    Wt Readings from Last 3 Encounters:  09/19/19 (!) 390 lb (176.9 kg)  08/30/19 (!) 386 lb (175.1 kg)  08/27/19 (!) 385 lb (174.6 kg)    Physical Exam Vitals signs and nursing note reviewed.  Constitutional:      General: He is not in acute distress.    Appearance: Normal appearance. He is well-developed and well-groomed. He is morbidly obese. He is not ill-appearing, toxic-appearing or diaphoretic.  HENT:     Head: Normocephalic and atraumatic.     Jaw: There is normal jaw occlusion.     Right Ear: Hearing normal.     Left Ear: Hearing normal.     Nose: Nose normal.     Mouth/Throat:     Lips: Pink.     Mouth: Mucous membranes are moist.     Pharynx: Oropharynx is clear. Uvula midline.  Eyes:     General: Lids are normal.     Extraocular Movements: Extraocular movements intact.     Conjunctiva/sclera: Conjunctivae normal.     Pupils: Pupils are equal, round, and reactive to light.  Neck:     Musculoskeletal: Normal range of motion and neck supple.     Thyroid: No thyroid mass, thyromegaly or thyroid tenderness.     Vascular: No carotid bruit or JVD.     Trachea: Trachea and phonation normal.  Cardiovascular:     Rate and Rhythm: Normal rate. Rhythm regularly irregular.     Chest Wall: PMI is not displaced.     Pulses: Normal pulses.          Dorsalis pedis pulses are 2+ on the right side and 2+ on the left side.  Posterior tibial pulses are 2+ on the right side and 2+ on the left side.     Heart sounds: Normal heart sounds. No murmur. No friction rub. No gallop.   Pulmonary:     Effort: Pulmonary effort is normal. No respiratory distress.     Breath  sounds: Normal breath sounds. No wheezing.  Abdominal:     General: Bowel sounds are normal. There is no distension or abdominal bruit.     Palpations: Abdomen is soft. There is no hepatomegaly or splenomegaly.     Tenderness: There is no abdominal tenderness. There is no right CVA tenderness or left CVA tenderness.     Hernia: No hernia is present.  Musculoskeletal:     Right shoulder: He exhibits decreased range of motion, tenderness, pain and decreased strength. He exhibits no bony tenderness, no swelling, no effusion, no crepitus, no deformity, no laceration, no spasm and normal pulse.     Left shoulder: He exhibits pain. He exhibits normal range of motion, no tenderness, no bony tenderness, no swelling, no effusion, no crepitus, no deformity, no laceration, no spasm, normal pulse and normal strength.     Right elbow: Normal.    Cervical back: He exhibits decreased range of motion. He exhibits no tenderness, no bony tenderness, no swelling, no edema, no deformity, no laceration, no pain, no spasm and normal pulse.     Thoracic back: Normal.     Right lower leg: 2+ Edema present.     Left lower leg: 2+ Edema present.       Feet:     Comments: Pt guarding right shoulder. Pt able to raise arm with assistance from left hand. Unable to lift on own due to pain.  Feet:     Right foot:     Skin integrity: Callus and dry skin present.     Left foot:     Skin integrity: Erythema, warmth, callus, dry skin and fissure present. No ulcer, blister or skin breakdown.  Lymphadenopathy:     Cervical: No cervical adenopathy.  Skin:    General: Skin is warm and dry.     Capillary Refill: Capillary refill takes less than 2 seconds.     Coloration: Skin is not cyanotic, jaundiced or pale.     Findings: No rash.  Neurological:     General: No focal deficit present.     Mental Status: He is alert and oriented to person, place, and time.     Cranial Nerves: Cranial nerves are intact. No cranial nerve  deficit.     Sensory: Sensation is intact. No sensory deficit.     Motor: Motor function is intact. No weakness.     Coordination: Coordination is intact. Coordination normal.     Gait: Gait is intact. Gait normal.     Deep Tendon Reflexes: Reflexes are normal and symmetric. Reflexes normal.  Psychiatric:        Attention and Perception: Attention and perception normal.        Mood and Affect: Mood and affect normal.        Speech: Speech normal.        Behavior: Behavior normal. Behavior is cooperative.        Thought Content: Thought content normal.        Cognition and Memory: Cognition and memory normal.        Judgment: Judgment normal.     Results for orders placed or performed in visit on 09/19/19  FINGERSTICK BLOOD SUGAR  Result Value Ref Range   Glu Hemocue Waived 170 (H) 65 - 99 mg/dL       Pertinent labs & imaging results that were available during my care of the patient were reviewed by me and considered in my medical decision making.  Assessment & Plan:  Richard Crawford was seen today for shoulder pain.  Diagnoses and all orders for this visit:  Chronic right shoulder pain Primary osteoarthritis of left shoulder Followed by ortho and referred to pain management. Pt aware to have MRI as scheduled this Friday. Will continue below. Keep specialist appointments.  -     methocarbamol (ROBAXIN) 750 MG tablet; Take 1 tablet (750 mg total) by mouth 3 (three) times daily. -     celecoxib (CELEBREX) 200 MG capsule; Take 1 capsule (200 mg total) by mouth 2 (two) times daily.  Diabetic mononeuropathy associated with type 2 diabetes mellitus ([place]) Well controlled with below, will continue.  -     gabapentin (NEURONTIN) 100 MG capsule; Take 1 capsule (100 mg total) by mouth 3 (three) times daily. -     methocarbamol (ROBAXIN) 750 MG tablet; Take 1 tablet (750 mg total) by mouth 3 (three) times daily.  Type 2 diabetes mellitus without complication, without long-term current use of  insulin ([facility]) Blood sugar 170 in office today. Will stop prednisone due to spikes in blood sugar. Continue medications as prescribed and report any persistently high readings.  -     FINGERSTICK BLOOD SUGAR  Cellulitis of left heel Wound care discussed in detail. Medications as prescribed. Report any new or worsening symptoms. Follow up with Dr. Irving Shows and in office in 2 weeks. -     doxycycline (VIBRA-TABS) 100 MG tablet; Take 1 tablet (100 mg total) by mouth 2 (two) times daily for 10 days. 1 po bid -     sulfamethoxazole-trimethoprim (BACTRIM DS) 800-160 MG tablet; Take 1 tablet by mouth 2 (two) times daily for 7 days.    Total time spent with patient 40 minutes.  Greater than 50% of encounter spent in coordination of care/counseling.  Continue all other maintenance medications.  Follow up plan: Return if symptoms worsen or fail to improve.  Continue healthy lifestyle choices, including diet (rich in fruits, vegetables, and lean proteins, and low in salt and simple carbohydrates) and exercise (at least 30 minutes of moderate physical activity daily).  Educational handout given for DM  The above assessment and management plan was discussed with the patient. The patient verbalized understanding of and has agreed to the management plan. Patient is aware to call the clinic if they develop any new symptoms or if symptoms persist or worsen. Patient is aware when to return to the clinic for a follow-up visit. Patient educated on when it is appropriate to go to the emergency department.   Monia Pouch, FNP-C [facility] [phone]

## 2019-09-26 ENCOUNTER — Other Ambulatory Visit: Payer: Self-pay

## 2019-09-26 ENCOUNTER — Ambulatory Visit: Payer: Medicare Other | Admitting: Orthopedic Surgery

## 2019-09-26 ENCOUNTER — Encounter: Payer: Self-pay | Admitting: Orthopedic Surgery

## 2019-09-26 DIAGNOSIS — M25511 Pain in right shoulder: Secondary | ICD-10-CM

## 2019-09-27 ENCOUNTER — Encounter: Payer: Self-pay | Admitting: Orthopedic Surgery

## 2019-09-27 NOTE — Progress Notes (Signed)
Office Visit Note   Patient: Richard Crawford           Date of Birth: 1956/08/28           MRN: [id number] Visit Date: 09/26/2019 Requested by: Baruch Gouty, [place],  [place] [id number] PCP: Baruch Gouty, FNP  Subjective: Chief Complaint  Patient presents with  . Follow-up    HPI: Richard Crawford is a patient with right shoulder pain.  Since we have seen him he has had an MRI scan.  Does show a longitudinal split tear in the biceps along with supraspinatus tendinosis and moderate AC joint arthritis.  He has had that shoulder partial replacement in for 15 years.  Currently denying any acute pain.  Actually is getting little bit better.  Laboratory values demonstrate decreased in sed rate to 25 and CRP at 3.9.  He does report 2 episodes of having a 100 degree temperature.  He is currently been on antibiotics for 2 days for a foot problem.  Denies any pain in his total knees.              ROS: All systems reviewed are negative as they relate to the chief complaint within the history of present illness.  Patient denies  fevers or chills.   Assessment & Plan: Visit Diagnoses:  1. Acute pain of right shoulder     Plan: Impression is right shoulder pain with some of the potential history clouded by use of antibiotics.  Nonetheless his sed rate and C-reactive protein are in the near normal range.  The MRI scan does not show a joint effusion.  Dr. Junius Roads tried to aspirate the shoulder in did not have any success with any fluid within the shoulder joint.  I think all this bodes well for absence of infection but it does not really explain the acute pain that he had.  [place]-Turner syndrome also under consideration.  At this time I do not think there is really any intervention to be done for the shoulder.  He does have a split tear of the biceps tendon which could give him some aggravating pain but not the acute debilitating pain that he had recently.  I would favor observation for now.   I think if that shoulder joint is infected it will become more obvious once he is off oral antibiotics.  Follow-Up Instructions: Return in about 4 weeks (around 10/24/2019).   Orders:  No orders of the defined types were placed in this encounter.  No orders of the defined types were placed in this encounter.     Procedures: No procedures performed   Clinical Data: No additional findings.  Objective: Vital Signs: There were no vitals taken for this visit.  Physical Exam:   Constitutional: Patient appears well-developed HEENT:  Head: Normocephalic Eyes:EOM are normal Neck: Normal range of motion Cardiovascular: Normal rate Pulmonary/chest: Effort normal Neurologic: Patient is alert Skin: Skin is warm Psychiatric: Patient has normal mood and affect    Ortho Exam: Ortho exam demonstrates 5 out of 5 grip EPL FPL interosseous wrist flexion extension bicep triceps and deltoid strength.  Still has some pain with passive range of motion of the shoulder.  Occasional radiation into the biceps region.  No warmth or lymphadenopathy around the shoulder girdle region on the right versus left.  No asymmetric AC joint tenderness is present.  Specialty Comments:  No specialty comments available.  Imaging: No results found.   PMFS History: Patient Active Problem  List   Diagnosis Date Noted  . Chronic right shoulder pain 08/30/2019  . Absolute anemia 08/30/2019  . Fever 08/04/2019  . Right shoulder pain 08/04/2019  . Sepsis ([facility]) 08/04/2019  . Diabetic mononeuropathy associated with type 2 diabetes mellitus ([place]) 06/25/2019  . Low libido 06/25/2019  . Hypertensive retinopathy of both eyes 03/21/2019  . Permanent atrial fibrillation ([place]) 03/09/2019  . Fatigue 03/09/2019  . Chronic diastolic HF (heart failure) ([facility]) 03/09/2019  . Educated about COVID-19 virus infection 03/09/2019  . Morbid obesity with BMI of 50.0-59.9, adult ([place]) 12/15/2018  . History of colonic polyps  12/15/2018  . Skin abnormalities 12/15/2018  . Generalized anxiety disorder 06/29/2018  . Chronic heart failure with preserved ejection fraction ([place]) 05/01/2018  . Hyperlipidemia associated with type 2 diabetes mellitus ([place]) 02/08/2018  . Venous stasis dermatitis of both lower extremities 01/05/2018  . Obstructive sleep apnea 01/05/2018  . Papillary thyroid carcinoma ([place]) 11/29/2017  . Primary osteoarthritis of left shoulder 11/15/2017  . Combined forms of age-related cataract of both eyes 11/14/2017  . Myopia with astigmatism and presbyopia, bilateral 11/14/2017  . Hypertension associated with diabetes ([place] [facility]) 09/26/2017  . Corns and callosity 09/26/2017  . Postoperative hypothyroidism 09/26/2017  . Type 2 diabetes mellitus without complication, without long-term current use of insulin ([place]) 09/26/2017  . Rapid atrial fibrillation ([place]) 09/16/2016   Past Medical History:  Diagnosis Date  . Anxiety   . Arthritis    left shoulder  . Atrial fibrillation ([facility])   . Cancer ([facility]) 1989   thyroid  . Cellulitis   . Chronic heart failure with preserved ejection fraction ([place])   . Diabetes mellitus without complication ([place])   . Hemorrhoids    external  . Hx of colonic polyps   . Hyperlipidemia   . Hypertension   . Migraine   . Papillary thyroid carcinoma ([place])   . Postoperative hypothyroidism   . Primary osteoarthritis of left shoulder   . Sleep apnea   . Venous stasis dermatitis of both lower extremities     Family History  Problem Relation Age of Onset  . Cancer Mother        breast, stomach, ovarian  . Hypertension Mother   . Dementia Mother   . Heart disease Father     Past Surgical History:  Procedure Laterality Date  . ANKLE FUSION    . KNEE ARTHROSCOPY    . SHOULDER ARTHROSCOPY    . TOTAL SHOULDER REPLACEMENT Right    Social History   Occupational History  . Not on file  Tobacco Use  . Smoking status: Never Smoker  . Smokeless tobacco: Never Used  Substance  and Sexual Activity  . Alcohol use: Not Currently  . Drug use: Never  . Sexual activity: Not on file

## 2019-09-28 ENCOUNTER — Other Ambulatory Visit: Payer: Self-pay | Admitting: Family Medicine

## 2019-09-28 DIAGNOSIS — G8929 Other chronic pain: Secondary | ICD-10-CM

## 2019-09-28 DIAGNOSIS — M25511 Pain in right shoulder: Secondary | ICD-10-CM

## 2019-10-21 ENCOUNTER — Other Ambulatory Visit: Payer: Self-pay | Admitting: Family Medicine

## 2019-10-29 ENCOUNTER — Ambulatory Visit: Payer: Medicare Other | Admitting: Orthopedic Surgery

## 2019-10-29 ENCOUNTER — Other Ambulatory Visit: Payer: Self-pay

## 2019-10-29 DIAGNOSIS — M25511 Pain in right shoulder: Secondary | ICD-10-CM

## 2019-10-30 ENCOUNTER — Telehealth: Payer: Self-pay | Admitting: Family Medicine

## 2019-10-31 ENCOUNTER — Encounter: Payer: Self-pay | Admitting: Orthopedic Surgery

## 2019-10-31 ENCOUNTER — Other Ambulatory Visit: Payer: Self-pay

## 2019-10-31 ENCOUNTER — Ambulatory Visit (INDEPENDENT_AMBULATORY_CARE_PROVIDER_SITE_OTHER): Payer: Medicare Other | Admitting: Family Medicine

## 2019-10-31 ENCOUNTER — Encounter: Payer: Self-pay | Admitting: Family Medicine

## 2019-10-31 VITALS — BP 116/79 | HR 85 | Temp 98.0°F | Resp 20 | Ht 75.0 in | Wt 393.0 lb

## 2019-10-31 DIAGNOSIS — E890 Postprocedural hypothyroidism: Secondary | ICD-10-CM

## 2019-10-31 DIAGNOSIS — E1169 Type 2 diabetes mellitus with other specified complication: Secondary | ICD-10-CM

## 2019-10-31 DIAGNOSIS — I10 Essential (primary) hypertension: Secondary | ICD-10-CM

## 2019-10-31 DIAGNOSIS — E1141 Type 2 diabetes mellitus with diabetic mononeuropathy: Secondary | ICD-10-CM

## 2019-10-31 DIAGNOSIS — I152 Hypertension secondary to endocrine disorders: Secondary | ICD-10-CM

## 2019-10-31 DIAGNOSIS — E1159 Type 2 diabetes mellitus with other circulatory complications: Secondary | ICD-10-CM | POA: Diagnosis not present

## 2019-10-31 DIAGNOSIS — E785 Hyperlipidemia, unspecified: Secondary | ICD-10-CM

## 2019-10-31 DIAGNOSIS — E119 Type 2 diabetes mellitus without complications: Secondary | ICD-10-CM

## 2019-10-31 LAB — BAYER DCA HB A1C WAIVED: HB A1C (BAYER DCA - WAIVED): 6.5 % (ref <7.0)

## 2019-10-31 MED ORDER — GABAPENTIN 100 MG PO CAPS: 300.0000 mg | ORAL_CAPSULE | Freq: Three times a day (TID) | ORAL | 3 refills | Status: DC

## 2019-10-31 NOTE — Patient Instructions (Signed)

## 2019-10-31 NOTE — Progress Notes (Signed)
Office Visit Note   Patient: Richard Crawford           Date of Birth: 03-08-1956           MRN: [id number] Visit Date: 10/29/2019 Requested by: Baruch Gouty, [place],  [place] [id number] PCP: Baruch Gouty, FNP  Subjective: Chief Complaint  Patient presents with  . Right Shoulder - Follow-up    HPI: Richard Crawford is a patient here for 4-week follow-up on right shoulder.  In general he is doing very well.  Reports decreased pain and increased motion.  He has been going to pain management where they have increased his gabapentin.  He is currently also importantly on antibiotics for some type of heel ulcer.  Denies any fevers or chills.  Left shoulder hurts him more than the right shoulder.  Concern early on in this clinical course was for acute infection.  Aspiration was negative and laboratory values unremarkable for that.  Currently he is improved.              ROS: All systems reviewed are negative as they relate to the chief complaint within the history of present illness.  Patient denies  fevers or chills.   Assessment & Plan: Visit Diagnoses:  1. Acute pain of right shoulder     Plan: Impression is resolved pain right shoulder.  This does not look like infection at this time however he has been on some oral antibiotics for another problem in the heel.  I think we need to wait this out longer with no intervention indicated at this time.  He had no fluid in the joint.  If he has recurrent symptoms in that shoulder I would favor CT scanning just to see if there is an effusion in the joint.  Otherwise we will see him back as needed.  No indication for further work-up or intervention at this time.  Follow-Up Instructions: Return if symptoms worsen or fail to improve.   Orders:  No orders of the defined types were placed in this encounter.  No orders of the defined types were placed in this encounter.     Procedures: No procedures performed   Clinical Data: No  additional findings.  Objective: Vital Signs: There were no vitals taken for this visit.  Physical Exam:   Constitutional: Patient appears well-developed HEENT:  Head: Normocephalic Eyes:EOM are normal Neck: Normal range of motion Cardiovascular: Normal rate Pulmonary/chest: Effort normal Neurologic: Patient is alert Skin: Skin is warm Psychiatric: Patient has normal mood and affect    Ortho Exam: Ortho exam demonstrates full active and passive range of motion of the neck.  On the right-hand side he has excellent range of motion without much pain.  No warmth to the shoulder.  Rotator cuff strength is good.  No clicking or grinding in the shoulder.  No AC joint tenderness.  Specialty Comments:  No specialty comments available.  Imaging: No results found.   PMFS History: Patient Active Problem List   Diagnosis Date Noted  . Chronic right shoulder pain 08/30/2019  . Absolute anemia 08/30/2019  . Fever 08/04/2019  . Right shoulder pain 08/04/2019  . Sepsis ([facility] [facility]) 08/04/2019  . Diabetic mononeuropathy associated with type 2 diabetes mellitus ([place]) 06/25/2019  . Low libido 06/25/2019  . Hypertensive retinopathy of both eyes 03/21/2019  . Permanent atrial fibrillation ([place]) 03/09/2019  . Fatigue 03/09/2019  . Chronic diastolic HF (heart failure) ([place]) 03/09/2019  . Educated about COVID-19 virus infection  03/09/2019  . Morbid obesity with BMI of 50.0-59.9, adult ([place]) 12/15/2018  . History of colonic polyps 12/15/2018  . Skin abnormalities 12/15/2018  . Generalized anxiety disorder 06/29/2018  . Chronic heart failure with preserved ejection fraction ([place]) 05/01/2018  . Hyperlipidemia associated with type 2 diabetes mellitus ([place]) 02/08/2018  . Venous stasis dermatitis of both lower extremities 01/05/2018  . Obstructive sleep apnea 01/05/2018  . Papillary thyroid carcinoma ([place]) 11/29/2017  . Primary osteoarthritis of left shoulder 11/15/2017  . Combined forms of  age-related cataract of both eyes 11/14/2017  . Myopia with astigmatism and presbyopia, bilateral 11/14/2017  . Hypertension associated with diabetes ([place]) 09/26/2017  . Corns and callosity 09/26/2017  . Postoperative hypothyroidism 09/26/2017  . Type 2 diabetes mellitus without complication, without long-term current use of insulin ([place]) 09/26/2017  . Rapid atrial fibrillation ([place]) 09/16/2016   Past Medical History:  Diagnosis Date  . Anxiety   . Arthritis    left shoulder  . Atrial fibrillation ([facility])   . Cancer ([facility]) 1989   thyroid  . Cellulitis   . Chronic heart failure with preserved ejection fraction ([place])   . Diabetes mellitus without complication ([place])   . Hemorrhoids    external  . Hx of colonic polyps   . Hyperlipidemia   . Hypertension   . Migraine   . Papillary thyroid carcinoma ([place])   . Postoperative hypothyroidism   . Primary osteoarthritis of left shoulder   . Sleep apnea   . Venous stasis dermatitis of both lower extremities     Family History  Problem Relation Age of Onset  . Cancer Mother        breast, stomach, ovarian  . Hypertension Mother   . Dementia Mother   . Heart disease Father     Past Surgical History:  Procedure Laterality Date  . ANKLE FUSION    . KNEE ARTHROSCOPY    . SHOULDER ARTHROSCOPY    . TOTAL SHOULDER REPLACEMENT Right    Social History   Occupational History  . Not on file  Tobacco Use  . Smoking status: Never Smoker  . Smokeless tobacco: Never Used  Substance and Sexual Activity  . Alcohol use: Not Currently  . Drug use: Never  . Sexual activity: Not on file

## 2019-11-01 LAB — CBC WITH DIFFERENTIAL/PLATELET
Basophils Absolute: 0.1 10*3/uL (ref 0.0–0.2)
Basos: 1 %
EOS (ABSOLUTE): 0.2 10*3/uL (ref 0.0–0.4)
Eos: 3 %
Hematocrit: 39.9 % (ref 37.5–51.0)
Hemoglobin: 13 g/dL (ref 13.0–17.7)
Immature Grans (Abs): 0 10*3/uL (ref 0.0–0.1)
Immature Granulocytes: 0 %
Lymphocytes Absolute: 2.4 10*3/uL (ref 0.7–3.1)
Lymphs: 33 %
MCH: 29.2 pg (ref 26.6–33.0)
MCHC: 32.6 g/dL (ref 31.5–35.7)
MCV: 90 fL (ref 79–97)
Monocytes Absolute: 0.8 10*3/uL (ref 0.1–0.9)
Monocytes: 11 %
Neutrophils Absolute: 3.7 10*3/uL (ref 1.4–7.0)
Neutrophils: 52 %
Platelets: 238 10*3/uL (ref 150–450)
RBC: 4.45 x10E6/uL (ref 4.14–5.80)
RDW: 12.9 % (ref 11.6–15.4)
WBC: 7.1 10*3/uL (ref 3.4–10.8)

## 2019-11-01 LAB — CMP14+EGFR
ALT: 18 IU/L (ref 0–44)
AST: 21 IU/L (ref 0–40)
Albumin/Globulin Ratio: 1.5 (ref 1.2–2.2)
Albumin: 4.4 g/dL (ref 3.8–4.8)
Alkaline Phosphatase: 92 IU/L (ref 39–117)
BUN/Creatinine Ratio: 15 (ref 10–24)
BUN: 16 mg/dL (ref 8–27)
Bilirubin Total: 0.8 mg/dL (ref 0.0–1.2)
CO2: 27 mmol/L (ref 20–29)
Calcium: 9.3 mg/dL (ref 8.6–10.2)
Chloride: 102 mmol/L (ref 96–106)
Creatinine, Ser: 1.05 mg/dL (ref 0.76–1.27)
GFR calc Af Amer: 86 mL/min/{1.73_m2} (ref >59)
GFR calc non Af Amer: 74 mL/min/{1.73_m2} (ref >59)
Globulin, Total: 3 g/dL (ref 1.5–4.5)
Glucose: 233 mg/dL — ABNORMAL HIGH (ref 65–99)
Potassium: 4.4 mmol/L (ref 3.5–5.2)
Sodium: 144 mmol/L (ref 134–144)
Total Protein: 7.4 g/dL (ref 6.0–8.5)

## 2019-11-01 LAB — THYROID PANEL WITH TSH
Free Thyroxine Index: 2.4 (ref 1.2–4.9)
T3 Uptake Ratio: 27 % (ref 24–39)
T4, Total: 8.8 ug/dL (ref 4.5–12.0)
TSH: 0.392 u[IU]/mL — ABNORMAL LOW (ref 0.450–4.500)

## 2019-11-01 LAB — LIPID PANEL
Chol/HDL Ratio: 2.6 ratio (ref 0.0–5.0)
Cholesterol, Total: 97 mg/dL — ABNORMAL LOW (ref 100–199)
HDL: 37 mg/dL — ABNORMAL LOW (ref >39)
LDL Chol Calc (NIH): 39 mg/dL (ref 0–99)
Triglycerides: 112 mg/dL (ref 0–149)
VLDL Cholesterol Cal: 21 mg/dL (ref 5–40)

## 2019-11-01 NOTE — Progress Notes (Signed)
Subjective:  Patient ID: Richard Crawford, male    DOB: [birth year], 64 y.o.   MRN: [id number]  Patient Care Team: Baruch Gouty, FNP as PCP - General (Family Medicine) Minus Breeding, MD as PCP - Cardiology (Cardiology)   Chief Complaint:  Medical Management of Chronic Issues (2-3 mo ), Diabetes, Hypothyroidism, Hyperlipidemia, and Hypertension   HPI: Richard Crawford is a 64 y.o. male presenting on 10/31/2019 for Medical Management of Chronic Issues (2-3 mo ), Diabetes, Hypothyroidism, Hyperlipidemia, and Hypertension   1. Type 2 diabetes mellitus without complication, without long-term current use of insulin (HCC) Pt presents for follow up evaluation of Type 2 diabetes mellitus.  Current symptoms include foot ulcerations. Patient denies hyperglycemia, hypoglycemia , increased appetite, nausea, polydipsia, polyuria, visual disturbances, vomiting and weight loss.  Current diabetic medications include Metformin Compliant with meds - Yes  Current monitoring regimen: home blood tests - several times weekly Home blood sugar records: Did not bring log today but denies high or low readings, states ranging from 120-160. Any episodes of hypoglycemia? no  Known diabetic complications: peripheral neuropathy, cardiovascular disease and peripheral vascular disease Cardiovascular risk factors: advanced age (older than 52 for men, 19 for women), diabetes mellitus, dyslipidemia, hypertension, male gender, obesity (BMI >= 30 kg/m2) and sedentary lifestyle Eye exam current (within one year): yes Podiatry yearly?  Yes Weight trend: fluctuating a bit Current diet: in general, a "healthy" diet   Current exercise: none  PNA Vaccine UTD?  No Hep B Vaccine?  Yes Tdap Vaccine UTD?  No Urine microalbumin UTD? No  Is He on ACE inhibitor or angiotensin II receptor blocker?  Yes, valsartan Is He on statin? Yes lipitor Is He on ASA 81 mg daily?  No    2. Hypertension associated with diabetes  ([name]) Complaint with meds - Yes Current Medications - lopressor, valsartan, lasix, aldactone Checking BP at home - no Exercising Regularly - No Watching Salt intake - No Pertinent ROS:  Headache - No Fatigue - Yes Visual Disturbances - No Chest pain - No Dyspnea - No Palpitations - No LE edema - Yes They report good compliance with medications and can restate their regimen by memory. No medication side effects.  Family, social, and smoking history reviewed.   BP Readings from Last 3 Encounters:  10/31/19 116/79  09/19/19 123/74  08/30/19 118/65   CMP Latest Ref Rng & Units 10/31/2019 08/06/2019 08/05/2019  Glucose 65 - 99 mg/dL 233(H) - 145(H)  BUN 8 - 27 mg/dL 16 - 29(H)  Creatinine 0.76 - 1.27 mg/dL 1.05 1.09 1.17  Sodium 134 - 144 mmol/L 144 - 136  Potassium 3.5 - 5.2 mmol/L 4.4 - 3.5  Chloride 96 - 106 mmol/L 102 - 101  CO2 20 - 29 mmol/L 27 - 26  Calcium 8.6 - 10.2 mg/dL 9.3 - 8.4(L)  Total Protein 6.0 - 8.5 g/dL 7.4 - -  Total Bilirubin 0.0 - 1.2 mg/dL 0.8 - -  Alkaline Phos 39 - 117 IU/L 92 - -  AST 0 - 40 IU/L 21 - -  ALT 0 - 44 IU/L 18 - -      3. Hyperlipidemia associated with type 2 diabetes mellitus ([name]) Compliant with medications - Yes Current medications - lipitor Side effects from medications - No Diet - generally healthy Exercise - none  Lab Results  Component Value Date   CHOL 97 (L) 10/31/2019   HDL 37 (L) 10/31/2019   LDLCALC 39 10/31/2019   TRIG 112 10/31/2019  CHOLHDL 2.6 10/31/2019     Family and personal medical history reviewed. Smoking and ETOH history reviewed.    4. Postoperative hypothyroidism Compliant with medications - Yes Current medications - synthroid 250 mcg daily Adverse side effects - No Weight - stable  Bowel habit changes - No Heat or cold intolerance - No Mood changes - No Changes in sleep habits - No Fatigue - Yes Skin, hair, or nail changes - No Tremor - No Palpitations - No Edema - Yes Shortness of  breath - No  Lab Results  Component Value Date   TSH 0.392 (L) 10/31/2019     5. Diabetic mononeuropathy associated with type 2 diabetes mellitus ([place]) Reports symptoms have greatly improved since neurology increase gabapentin to 300 mg 3 times daily.  States he is tolerating this dose without associated side effects.     Relevant past medical, surgical, family, and social history reviewed and updated as indicated.  Allergies and medications reviewed and updated. Date reviewed: Chart in Epic.   Past Medical History:  Diagnosis Date  . Anxiety   . Arthritis    left shoulder  . Atrial fibrillation ([place])   . Cancer ([place]) 1989   thyroid  . Cellulitis   . Chronic heart failure with preserved ejection fraction ([place])   . Diabetes mellitus without complication ([place])   . Hemorrhoids    external  . Hx of colonic polyps   . Hyperlipidemia   . Hypertension   . Migraine   . Papillary thyroid carcinoma ([place])   . Postoperative hypothyroidism   . Primary osteoarthritis of left shoulder   . Sleep apnea   . Venous stasis dermatitis of both lower extremities     Past Surgical History:  Procedure Laterality Date  . ANKLE FUSION    . KNEE ARTHROSCOPY    . SHOULDER ARTHROSCOPY    . TOTAL SHOULDER REPLACEMENT Right     Social History   Socioeconomic History  . Marital status: Married    Spouse name: Not on file  . Number of children: Not on file  . Years of education: Not on file  . Highest education level: Not on file  Occupational History  . Not on file  Tobacco Use  . Smoking status: Never Smoker  . Smokeless tobacco: Never Used  Substance and Sexual Activity  . Alcohol use: Not Currently  . Drug use: Never  . Sexual activity: Not on file  Other Topics Concern  . Not on file  Social History Narrative  . Not on file   Social Determinants of Health   Financial Resource Strain:   . Difficulty of Paying Living Expenses: Not on file  Food Insecurity:   . Worried  About Charity fundraiser in the Last Year: Not on file  . Ran Out of Food in the Last Year: Not on file  Transportation Needs:   . Lack of Transportation (Medical): Not on file  . Lack of Transportation (Non-Medical): Not on file  Physical Activity:   . Days of Exercise per Week: Not on file  . Minutes of Exercise per Session: Not on file  Stress:   . Feeling of Stress : Not on file  Social Connections:   . Frequency of Communication with Friends and Family: Not on file  . Frequency of Social Gatherings with Friends and Family: Not on file  . Attends Religious Services: Not on file  . Active Member of Clubs or Organizations: Not on file  . Attends  Club or Organization Meetings: Not on file  . Marital Status: Not on file  Intimate Partner Violence:   . Fear of Current or Ex-Partner: Not on file  . Emotionally Abused: Not on file  . Physically Abused: Not on file  . Sexually Abused: Not on file    Outpatient Encounter Medications as of 10/31/2019  Medication Sig  . atorvastatin (LIPITOR) 20 MG tablet TAKE 1 TABLET BY MOUTH EVERY DAY  . celecoxib (CELEBREX) 200 MG capsule Take 1 capsule (200 mg total) by mouth 2 (two) times daily.  . furosemide (LASIX) 80 MG tablet Take 1 tablet (80 mg total) by mouth daily for 30 days.  Marland Kitchen gabapentin (NEURONTIN) 100 MG capsule Take 3 capsules (300 mg total) by mouth 3 (three) times daily.  Marland Kitchen glucose blood test strip Check BS daily and as needed Dx E11.9  . levothyroxine (SYNTHROID) 200 MCG tablet Take 1 tablet (200 mcg total) by mouth daily before breakfast.  . levothyroxine (SYNTHROID) 50 MCG tablet Take 1 tablet (50 mcg total) by mouth daily before breakfast.  . metFORMIN (GLUCOPHAGE-XR) 750 MG 24 hr tablet TAKE 1 TABLET BY MOUTH EVERY DAY WITH BREAKFAST  . methocarbamol (ROBAXIN) 750 MG tablet Take 1 tablet (750 mg total) by mouth 3 (three) times daily.  . metoprolol tartrate (LOPRESSOR) 100 MG tablet Take 200 mg by mouth daily.  . Multiple  Vitamin (MULTIVITAMIN) tablet Take 1 tablet by mouth 2 (two) times daily.  . Prodigy Lancets 28G MISC Check BS daily and as needed Dx E11.9  . spironolactone (ALDACTONE) 25 MG tablet Take 1 tablet (25 mg total) by mouth daily.  . valsartan (DIOVAN) 80 MG tablet TAKE 1 TABLET BY MOUTH EVERY DAY  . XARELTO 20 MG TABS tablet TAKE 1 TABLET (20 MG TOTAL) BY MOUTH DAILY WITH SUPPER FOR 30 DAYS.  . [DISCONTINUED] gabapentin (NEURONTIN) 100 MG capsule Take 1 capsule (100 mg total) by mouth 3 (three) times daily.  Marland Kitchen diltiazem (CARDIZEM CD) 360 MG 24 hr capsule Take 1 capsule (360 mg total) by mouth daily. (Patient not taking: Reported on 10/31/2019)  . [DISCONTINUED] acetaminophen (TYLENOL) 325 MG tablet Take 2 tablets (650 mg total) by mouth every 6 (six) hours as needed for mild pain, fever or headache (or Fever >/= 101).  . [DISCONTINUED] diazepam (VALIUM) 10 MG tablet Take 1 30 minutes before MRI scan for anxiety.  . [DISCONTINUED] diclofenac Sodium (VOLTAREN) 1 % GEL APPLY 2 GRAMS TO AFFECTED AREA 4 TIMES A DAY  . [DISCONTINUED] diltiazem (TIAZAC) 360 MG 24 hr capsule Take 360 mg by mouth daily.   No facility-administered encounter medications on file as of 10/31/2019.    Allergies  Allergen Reactions  . Morphine And Related Nausea And Vomiting    Review of Systems  Constitutional: Positive for fatigue. Negative for activity change, appetite change, chills, diaphoresis, fever and unexpected weight change.  HENT: Negative.   Eyes: Negative.  Negative for photophobia and visual disturbance.  Respiratory: Negative for cough, chest tightness and shortness of breath.   Cardiovascular: Negative for chest pain, palpitations and leg swelling.  Gastrointestinal: Negative for abdominal pain, blood in stool, constipation, diarrhea, nausea and vomiting.  Endocrine: Negative.   Genitourinary: Negative for decreased urine volume, difficulty urinating, dysuria, frequency and urgency.  Musculoskeletal:  Positive for arthralgias and myalgias.  Skin: Positive for wound (left heel, under care of Dr. Irving Shows).  Allergic/Immunologic: Negative.   Neurological: Negative for dizziness, tremors, seizures, syncope, facial asymmetry, speech difficulty, weakness, light-headedness, numbness  and headaches.  Hematological: Negative.   Psychiatric/Behavioral: Negative for confusion, hallucinations, sleep disturbance and suicidal ideas.  All other systems reviewed and are negative.       Objective:  BP 116/79   Pulse 85   Temp 98 F (36.7 C)   Resp 20   Ht '6\' 3"'$  (1.905 m)   Wt (!) 393 lb (178.3 kg)   SpO2 96%   BMI 49.12 kg/m    Wt Readings from Last 3 Encounters:  10/31/19 (!) 393 lb (178.3 kg)  09/19/19 (!) 390 lb (176.9 kg)  08/30/19 (!) 386 lb (175.1 kg)    Physical Exam Vitals and nursing note reviewed.  Constitutional:      General: He is not in acute distress.    Appearance: Normal appearance. He is well-developed and well-groomed. He is morbidly obese. He is not ill-appearing, toxic-appearing or diaphoretic.  HENT:     Head: Normocephalic and atraumatic.     Jaw: There is normal jaw occlusion.     Right Ear: Hearing normal.     Left Ear: Hearing normal.     Nose: Nose normal.     Mouth/Throat:     Lips: Pink.     Mouth: Mucous membranes are moist.     Pharynx: Oropharynx is clear. Uvula midline.  Eyes:     General: Lids are normal.     Extraocular Movements: Extraocular movements intact.     Conjunctiva/sclera: Conjunctivae normal.     Pupils: Pupils are equal, round, and reactive to light.  Neck:     Thyroid: No thyroid mass, thyromegaly or thyroid tenderness.     Vascular: No carotid bruit or JVD.     Trachea: Trachea and phonation normal.  Cardiovascular:     Rate and Rhythm: Normal rate and regular rhythm.     Chest Wall: PMI is not displaced.     Pulses: Normal pulses.     Heart sounds: Normal heart sounds. No murmur. No friction rub. No gallop.   Pulmonary:      Effort: Pulmonary effort is normal. No respiratory distress.     Breath sounds: Normal breath sounds. No wheezing.  Abdominal:     General: Bowel sounds are normal. There is no distension or abdominal bruit.     Palpations: Abdomen is soft. There is no hepatomegaly or splenomegaly.     Tenderness: There is no abdominal tenderness. There is no right CVA tenderness or left CVA tenderness.     Hernia: No hernia is present.  Musculoskeletal:     Right shoulder: Tenderness present. No swelling, deformity, effusion, laceration, bony tenderness or crepitus. Decreased range of motion (greatly improved since last visit). Normal strength. Normal pulse.     Right upper arm: Normal.     Right elbow: Normal.     Cervical back: Normal, normal range of motion and neck supple.     Right lower leg: No edema.     Left lower leg: No edema.  Feet:     Right foot:     Skin integrity: Callus and dry skin present.     Left foot:     Skin integrity: Ulcer (left heel, seeing podiatry), callus and dry skin present.  Lymphadenopathy:     Cervical: No cervical adenopathy.  Skin:    General: Skin is warm and dry.     Capillary Refill: Capillary refill takes less than 2 seconds.     Coloration: Skin is not cyanotic, jaundiced or pale.  Findings: No rash.  Neurological:     General: No focal deficit present.     Mental Status: He is alert and oriented to person, place, and time.     Cranial Nerves: Cranial nerves are intact. No cranial nerve deficit.     Sensory: Sensation is intact. No sensory deficit.     Motor: Motor function is intact. No weakness.     Coordination: Coordination is intact. Coordination normal.     Gait: Gait is intact. Gait normal.     Deep Tendon Reflexes: Reflexes are normal and symmetric. Reflexes normal.  Psychiatric:        Attention and Perception: Attention and perception normal.        Mood and Affect: Mood and affect normal.        Speech: Speech normal.        Behavior:  Behavior normal. Behavior is cooperative.        Thought Content: Thought content normal.        Cognition and Memory: Cognition and memory normal.        Judgment: Judgment normal.     Results for orders placed or performed in visit on 10/31/19  CBC with Differential/Platelet  Result Value Ref Range   WBC 7.1 3.4 - 10.8 x10E3/uL   RBC 4.45 4.14 - 5.80 x10E6/uL   Hemoglobin 13.0 13.0 - 17.7 g/dL   Hematocrit 39.9 37.5 - 51.0 %   MCV 90 79 - 97 fL   MCH 29.2 26.6 - 33.0 pg   MCHC 32.6 31.5 - 35.7 g/dL   RDW 12.9 11.6 - 15.4 %   Platelets 238 150 - 450 x10E3/uL   Neutrophils 52 Not Estab. %   Lymphs 33 Not Estab. %   Monocytes 11 Not Estab. %   Eos 3 Not Estab. %   Basos 1 Not Estab. %   Neutrophils Absolute 3.7 1.4 - 7.0 x10E3/uL   Lymphocytes Absolute 2.4 0.7 - 3.1 x10E3/uL   Monocytes Absolute 0.8 0.1 - 0.9 x10E3/uL   EOS (ABSOLUTE) 0.2 0.0 - 0.4 x10E3/uL   Basophils Absolute 0.1 0.0 - 0.2 x10E3/uL   Immature Granulocytes 0 Not Estab. %   Immature Grans (Abs) 0.0 0.0 - 0.1 x10E3/uL  CMP14+EGFR  Result Value Ref Range   Glucose 233 (H) 65 - 99 mg/dL   BUN 16 8 - 27 mg/dL   Creatinine, Ser 1.05 0.76 - 1.27 mg/dL   GFR calc non Af Amer 74 >59 mL/min/1.73   GFR calc Af Amer 86 >59 mL/min/1.73   BUN/Creatinine Ratio 15 10 - 24   Sodium 144 134 - 144 mmol/L   Potassium 4.4 3.5 - 5.2 mmol/L   Chloride 102 96 - 106 mmol/L   CO2 27 20 - 29 mmol/L   Calcium 9.3 8.6 - 10.2 mg/dL   Total Protein 7.4 6.0 - 8.5 g/dL   Albumin 4.4 3.8 - 4.8 g/dL   Globulin, Total 3.0 1.5 - 4.5 g/dL   Albumin/Globulin Ratio 1.5 1.2 - 2.2   Bilirubin Total 0.8 0.0 - 1.2 mg/dL   Alkaline Phosphatase 92 39 - 117 IU/L   AST 21 0 - 40 IU/L   ALT 18 0 - 44 IU/L  Lipid panel  Result Value Ref Range   Cholesterol, Total 97 (L) 100 - 199 mg/dL   Triglycerides 112 0 - 149 mg/dL   HDL 37 (L) >39 mg/dL   VLDL Cholesterol Cal 21 5 - 40 mg/dL   LDL Chol Calc (NIH)  39 0 - 99 mg/dL   Chol/HDL Ratio 2.6  0.0 - 5.0 ratio  Thyroid Panel With TSH  Result Value Ref Range   TSH 0.392 (L) 0.450 - 4.500 uIU/mL   T4, Total 8.8 4.5 - 12.0 ug/dL   T3 Uptake Ratio 27 24 - 39 %   Free Thyroxine Index 2.4 1.2 - 4.9  Bayer DCA Hb A1c Waived  Result Value Ref Range   HB A1C (BAYER DCA - WAIVED) 6.5 <7.0 %     Notes from cardiology, neurology, orthopedics, and podiatry reviewed in detail.  Pertinent labs & imaging results that were available during my care of the patient were reviewed by me and considered in my medical decision making.  Assessment & Plan:  Mehki was seen today for medical management of chronic issues, diabetes, hypothyroidism, hyperlipidemia and hypertension.  Diagnoses and all orders for this visit:  Type 2 diabetes mellitus without complication, without long-term current use of insulin (HCC) A1c 6.5 today.  Patient would like help with meal planning and nutrition.  Will place referral to diabetic educator today.  No changes in medications.  Follow-up in 3 months as discussed. -     CBC with Differential/Platelet -     Bayer DCA Hb A1c Waived -     Ambulatory referral to diabetic education  Hypertension associated with diabetes ([place]) BP well controlled. Changes were not made in regimen today. Goal BP is 130/80. Pt aware to report any persistent high or low readings. DASH diet and exercise encouraged. Exercise at least 150 minutes per week and increase as tolerated. Goal BMI > 25. Stress management encouraged. Avoid nicotine and tobacco product use. Avoid excessive alcohol and NSAID's. Avoid more than 2000 mg of sodium daily. Medications as prescribed. Follow up as scheduled.  -     CBC with Differential/Platelet -     CMP14+EGFR  Hyperlipidemia associated with type 2 diabetes mellitus ([place]) Diet encouraged - increase intake of fresh fruits and vegetables, increase intake of lean proteins. Bake, broil, or grill foods. Avoid fried, greasy, and fatty foods. Avoid fast foods. Increase  intake of fiber-rich whole grains. Exercise encouraged - at least 150 minutes per week and advance as tolerated.  Goal BMI < 25. Continue medications as prescribed. Follow up in 3-6 months as discussed.  -     CBC with Differential/Platelet -     Lipid panel -     Ambulatory referral to diabetic education  Postoperative hypothyroidism Thyroid disease has been well controlled. Labs are pending.  Patient adamant on increasing dose of Synthroid due to not feeling well.  Patient states he felt his best when he was on 400 mcg daily.  Adjustments to regimen will be made if warranted. Make sure to take medications on an empty stomach with a full glass of water. Make sure to avoid vitamins or supplements for at least 4 hours before and 4 hours after taking medications. Repeat labs in 3 months if adjustments are made and in 6 months if stable.   -     CBC with Differential/Platelet -     Thyroid Panel With TSH  Diabetic mononeuropathy associated with type 2 diabetes mellitus ([place]) Patient doing well on gabapentin 300 mg 3 times daily.  Will continue.  Patient aware to report any new or worsening symptoms.  Follow-up in 3 months as discussed. -     CBC with Differential/Platelet -     Bayer DCA Hb A1c Waived -  gabapentin (NEURONTIN) 100 MG capsule; Take 3 capsules (300 mg total) by mouth 3 (three) times daily.    Total time spent with patient 45 minutes.  Greater than 50% of encounter spent in coordination of care/counseling.  Continue all other maintenance medications.  Follow up plan: Return in about 3 months (around 01/29/2020), or if symptoms worsen or fail to improve, for DM.  Continue healthy lifestyle choices, including diet (rich in fruits, vegetables, and lean proteins, and low in salt and simple carbohydrates) and exercise (at least 30 minutes of moderate physical activity daily).  Educational handout given for DM  The above assessment and management plan was discussed with the  patient. The patient verbalized understanding of and has agreed to the management plan. Patient is aware to call the clinic if they develop any new symptoms or if symptoms persist or worsen. Patient is aware when to return to the clinic for a follow-up visit. Patient educated on when it is appropriate to go to the emergency department.   Monia Pouch, FNP-C [facility] [phone]

## 2019-11-06 ENCOUNTER — Encounter: Payer: Medicare Other | Attending: Family Medicine | Admitting: Nutrition

## 2019-11-06 ENCOUNTER — Other Ambulatory Visit: Payer: Self-pay

## 2019-11-06 VITALS — Ht 75.0 in | Wt 397.0 lb

## 2019-11-06 DIAGNOSIS — I10 Essential (primary) hypertension: Secondary | ICD-10-CM | POA: Diagnosis present

## 2019-11-06 DIAGNOSIS — Z6841 Body Mass Index (BMI) 40.0 and over, adult: Secondary | ICD-10-CM | POA: Diagnosis present

## 2019-11-06 DIAGNOSIS — IMO0002 Reserved for concepts with insufficient information to code with codable children: Secondary | ICD-10-CM

## 2019-11-06 DIAGNOSIS — E118 Type 2 diabetes mellitus with unspecified complications: Secondary | ICD-10-CM | POA: Diagnosis present

## 2019-11-06 DIAGNOSIS — E1165 Type 2 diabetes mellitus with hyperglycemia: Secondary | ICD-10-CM | POA: Insufficient documentation

## 2019-11-06 DIAGNOSIS — I5032 Chronic diastolic (congestive) heart failure: Secondary | ICD-10-CM | POA: Insufficient documentation

## 2019-11-06 DIAGNOSIS — E1159 Type 2 diabetes mellitus with other circulatory complications: Secondary | ICD-10-CM | POA: Diagnosis not present

## 2019-11-06 DIAGNOSIS — I152 Hypertension secondary to endocrine disorders: Secondary | ICD-10-CM

## 2019-11-06 NOTE — Progress Notes (Signed)
  Medical Nutrition Therapy:  Appt start time: 1600 end time:  1700.   Assessment:  Primary concerns today: Obesity, DM Type 2, Sleep apnea. Sees Dr. Dorris Fetch, Endocrinology. 250 mg Leveothroxine. COmplains of swelling and weight gain. Family hx of DM. Thyroid Cancer 1989. Wt a year ago 25 lbs less..  New Dx DM. Doesn't like a lot of vegetable. Stays up late at night due to pain and sleep apnea. Sleeps in during day. Been in [place] x 2 yrs.  Was in hospital in October in 2020.  Sees Dr. Dorris Fetch for Endocrinology for thyroid and DM.   Lab Results  Component Value Date   HGBA1C 6.5 10/31/2019   CMP Latest Ref Rng & Units 10/31/2019 08/06/2019 08/05/2019  Glucose 65 - 99 mg/dL 233(H) - 145(H)  BUN 8 - 27 mg/dL 16 - 29(H)  Creatinine 0.76 - 1.27 mg/dL 1.05 1.09 1.17  Sodium 134 - 144 mmol/L 144 - 136  Potassium 3.5 - 5.2 mmol/L 4.4 - 3.5  Chloride 96 - 106 mmol/L 102 - 101  CO2 20 - 29 mmol/L 27 - 26  Calcium 8.6 - 10.2 mg/dL 9.3 - 8.4(L)  Total Protein 6.0 - 8.5 g/dL 7.4 - -  Total Bilirubin 0.0 - 1.2 mg/dL 0.8 - -  Alkaline Phos 39 - 117 IU/L 92 - -  AST 0 - 40 IU/L 21 - -  ALT 0 - 44 IU/L 18 - -   Lipid Panel     Component Value Date/Time   CHOL 97 (L) 10/31/2019 1412   TRIG 112 10/31/2019 1412   HDL 37 (L) 10/31/2019 1412   CHOLHDL 2.6 10/31/2019 1412   LDLCALC 39 10/31/2019 1412   LABVLDL 21 10/31/2019 1412     Preferred Learning Style:    No preference indicated   Learning Readiness:   Ready  Change in progress   MEDICATIONS:   DIETARY INTAKE: .   Eats 2-3 meals per day. Irregular meal pattern.  Usual physical activity:   Estimated energy needs: 1800  calories 200 g carbohydrates 135 g protein 50 g fat  Progress Towards Goal(s):  In progress.   Nutritional Diagnosis:  NB-1.1 Food and nutrition-related knowledge deficit As related to DM Type 2, OBesity.  As evidenced by A1C 6.5% and BMI 49.    Intervention:  Nutrition and Diabetes education provided on  My Plate, CHO counting, meal planning, portion sizes, timing of meals, avoiding snacks between meals unless having a low blood sugar, target ranges for A1C and blood sugars, signs/symptoms and treatment of hyper/hypoglycemia, monitoring blood sugars, taking medications as prescribed, benefits of exercising 30 minutes per day and prevention of complications of DM.  Goals Follow My Plate  Eat meals on time Cut out snacks. DO not eat past 7 pm. Increase fresh fruits and vegetables. Drink only water-gallon a day unless ordered differntly by heart md or family md. Walk 30 minutes a day Lose 1-2 lbs per week. Avoid salt and processed foods..  Teaching Method Utilized: Visual Auditory Hands on  Handouts given during visit include:  The Plate Method   Meal Plan Card  Diabetes Instructions   Barriers to learning/adherence to lifestyle change: none  Demonstrated degree of understanding via:  Teach Back   Monitoring/Evaluation:  Dietary intake, exercise, , and body weight in 1 month(s).

## 2019-11-08 ENCOUNTER — Encounter: Payer: Self-pay | Admitting: Nutrition

## 2019-11-08 NOTE — Patient Instructions (Signed)
Goals Follow My Plate  Eat meals on time Cut out snacks. DO not eat past 7 pm. Increase fresh fruits and vegetables. Drink only water-gallon a day unless ordered differntly by heart md or family md. Walk 30 minutes a day Lose 1-2 lbs per week. Avoid salt and processed foods.

## 2019-11-19 ENCOUNTER — Other Ambulatory Visit: Payer: Self-pay | Admitting: Family Medicine

## 2019-11-29 ENCOUNTER — Other Ambulatory Visit: Payer: Self-pay | Admitting: Family Medicine

## 2019-11-29 DIAGNOSIS — E1159 Type 2 diabetes mellitus with other circulatory complications: Secondary | ICD-10-CM

## 2019-11-29 DIAGNOSIS — I152 Hypertension secondary to endocrine disorders: Secondary | ICD-10-CM

## 2019-12-06 ENCOUNTER — Other Ambulatory Visit: Payer: Self-pay | Admitting: "Endocrinology

## 2019-12-07 ENCOUNTER — Ambulatory Visit (INDEPENDENT_AMBULATORY_CARE_PROVIDER_SITE_OTHER): Payer: Medicare Other | Admitting: Family

## 2019-12-07 ENCOUNTER — Telehealth: Payer: Self-pay | Admitting: Family Medicine

## 2019-12-07 ENCOUNTER — Encounter: Payer: Self-pay | Admitting: Family

## 2019-12-07 DIAGNOSIS — L03116 Cellulitis of left lower limb: Secondary | ICD-10-CM | POA: Diagnosis not present

## 2019-12-07 MED ORDER — CEPHALEXIN 500 MG PO CAPS: 500.0000 mg | ORAL_CAPSULE | Freq: Three times a day (TID) | ORAL | 0 refills | Status: DC

## 2019-12-07 NOTE — Progress Notes (Signed)
   Virtual Visit via telephone Note Due to COVID-19 pandemic this visit was conducted virtually. This visit type was conducted due to national recommendations for restrictions regarding the COVID-19 Pandemic (e.g. social distancing, sheltering in place) in an effort to limit this patient's exposure and mitigate transmission in our community. All issues noted in this document were discussed and addressed.  A physical exam was not performed with this format.  I connected with Richard Crawford on 12/07/19 at 1:03 pm by telephone and verified that I am speaking with the correct person using two identifiers. Richard Crawford is currently located at home and no one is currently with him during visit. The provider, Evelina Dun, FNP is located in their office at time of visit.  I discussed the limitations, risks, security and privacy concerns of performing an evaluation and management service by telephone and the availability of in person appointments. I also discussed with the patient that there may be a patient responsible charge related to this service. The patient expressed understanding and agreed to proceed.   History and Present Illness:  HPI  Pt calls the office today with left leg erythemas,swelling, and burning. He reports the swelling is better this morning since elevated his leg during the night. He states the redness "wraps around my leg".  He reports intermittent aching pain 4 out that that is worse when he hits his leg against something. He states his leg looks like it is peeling and flaking off.  He has has cellulitis in bilateral legs in the past.     Denies any SOB.   Review of Systems  Cardiovascular: Positive for leg swelling.  All other systems reviewed and are negative.    Observations/Objective: No SOB or distress noted   Assessment and Plan: 1. Cellulitis of left lower extremity Keep leg elevated Report any increase s/s of infection (increased redness, swelling, tenderness,  or fevers) If any SOB go to ED!!!! Keep follow up with PCP - cephALEXin (KEFLEX) 500 MG capsule; Take 1 capsule (500 mg total) by mouth 3 (three) times daily.  Dispense: 30 capsule; Refill: 0     I discussed the assessment and treatment plan with the patient. The patient was provided an opportunity to ask questions and all were answered. The patient agreed with the plan and demonstrated an understanding of the instructions.   The patient was advised to call back or seek an in-person evaluation if the symptoms worsen or if the condition fails to improve as anticipated.  The above assessment and management plan was discussed with the patient. The patient verbalized understanding of and has agreed to the management plan. Patient is aware to call the clinic if symptoms persist or worsen. Patient is aware when to return to the clinic for a follow-up visit. Patient educated on when it is appropriate to go to the emergency department.   Time call ended: 1:18 pm   I provided 15 minutes of non-face-to-face time during this encounter.    Evelina Dun, FNP

## 2019-12-07 NOTE — Telephone Encounter (Signed)
@  logo@ Incoming Patient Call  12/07/2019  What symptoms do you have? Lowe left leg near calf is red swollen and burning same leg he has a heel ulceration that is healing up   How long have you been sick? A couple days ago  Have you been seen for this problem? no  If your provider decides to give you a prescription, which pharmacy would you like for it to be sent to? [facility], pt has appt next week but wanted to talk to Sharyn Lull about this   Patient informed that this information will be sent to the clinical staff for review and that they should receive a follow up call.

## 2019-12-07 NOTE — Telephone Encounter (Signed)
Telvisit appt made

## 2019-12-11 ENCOUNTER — Other Ambulatory Visit: Payer: Self-pay

## 2019-12-12 ENCOUNTER — Encounter: Payer: Self-pay | Admitting: Family Medicine

## 2019-12-12 ENCOUNTER — Other Ambulatory Visit: Payer: Self-pay

## 2019-12-12 ENCOUNTER — Ambulatory Visit: Payer: Medicare Other | Admitting: Family Medicine

## 2019-12-12 VITALS — BP 110/64 | HR 78 | Temp 97.5°F | Ht 75.0 in | Wt 390.1 lb

## 2019-12-12 DIAGNOSIS — E119 Type 2 diabetes mellitus without complications: Secondary | ICD-10-CM

## 2019-12-12 DIAGNOSIS — I10 Essential (primary) hypertension: Secondary | ICD-10-CM | POA: Diagnosis not present

## 2019-12-12 DIAGNOSIS — E1159 Type 2 diabetes mellitus with other circulatory complications: Secondary | ICD-10-CM | POA: Diagnosis not present

## 2019-12-12 DIAGNOSIS — I152 Hypertension secondary to endocrine disorders: Secondary | ICD-10-CM

## 2019-12-12 DIAGNOSIS — L03116 Cellulitis of left lower limb: Secondary | ICD-10-CM | POA: Diagnosis not present

## 2019-12-12 MED ORDER — METOPROLOL TARTRATE 100 MG PO TABS: 200.0000 mg | ORAL_TABLET | Freq: Every day | ORAL | 3 refills | Status: DC

## 2019-12-12 MED ORDER — SPIRONOLACTONE 25 MG PO TABS: 25.0000 mg | ORAL_TABLET | Freq: Every day | ORAL | 3 refills | Status: DC

## 2019-12-12 MED ORDER — METFORMIN HCL ER 750 MG PO TB24: ORAL_TABLET | ORAL | 3 refills | Status: DC

## 2019-12-12 MED ORDER — VALSARTAN 80 MG PO TABS: 80.0000 mg | ORAL_TABLET | Freq: Every day | ORAL | 3 refills | Status: DC

## 2019-12-12 NOTE — Progress Notes (Signed)
Subjective:  Patient ID: Richard Crawford, male    DOB: 06-06-1956, 64 y.o.   MRN: [id number]  Patient Care Team: Baruch Gouty, FNP as PCP - General (Family Medicine) Minus Breeding, MD as PCP - Cardiology (Cardiology)   Chief Complaint:  Cellulitis (pt did televisit with Evelina Dun 12/07/19-given oral antibiotics which he is still taking and says the leg is improving)   HPI: Richard Crawford is a 64 y.o. male presenting on 12/12/2019 for Cellulitis (pt did televisit with Evelina Dun 12/07/19-given oral antibiotics which he is still taking and says the leg is improving)   1. Cellulitis of left lower extremity Pt following up today for cellulitis to LLE. He was seen by C. Hawks on 12/07/2019 and placed on Keflex. Pt has been taking this as prescribed. States he has noticed great improvement in symptoms. No fever, chills, weakness, or confusion.   2. Hypertension associated with diabetes ([place]) Has been taking medications as prescribed and doing well with them. No headaches, chest pain, shortness of breath, palpitations, weakness, or confusion.   3. Type 2 diabetes mellitus without complication, without long-term current use of insulin ([place]) Compliant with medications and tries to watch diet. No polyuria, polyphagia, or polydipsia.      Relevant past medical, surgical, family, and social history reviewed and updated as indicated.  Allergies and medications reviewed and updated. Date reviewed: Chart in Epic.   Past Medical History:  Diagnosis Date  . Anxiety   . Arthritis    left shoulder  . Atrial fibrillation ([place])   . Cancer ([place]) 1989   thyroid  . Cellulitis   . Chronic heart failure with preserved ejection fraction ([place])   . Diabetes mellitus without complication ([place])   . Hemorrhoids    external  . Hx of colonic polyps   . Hyperlipidemia   . Hypertension   . Migraine   . Papillary thyroid carcinoma ([place])   . Postoperative hypothyroidism   . Primary osteoarthritis  of left shoulder   . Sleep apnea   . Venous stasis dermatitis of both lower extremities     Past Surgical History:  Procedure Laterality Date  . ANKLE FUSION    . KNEE ARTHROSCOPY    . SHOULDER ARTHROSCOPY    . TOTAL SHOULDER REPLACEMENT Right     Social History   Socioeconomic History  . Marital status: Married    Spouse name: Not on file  . Number of children: Not on file  . Years of education: Not on file  . Highest education level: Not on file  Occupational History  . Not on file  Tobacco Use  . Smoking status: Never Smoker  . Smokeless tobacco: Never Used  Substance and Sexual Activity  . Alcohol use: Not Currently  . Drug use: Never  . Sexual activity: Not on file  Other Topics Concern  . Not on file  Social History Narrative  . Not on file   Social Determinants of Health   Financial Resource Strain:   . Difficulty of Paying Living Expenses: Not on file  Food Insecurity:   . Worried About Charity fundraiser in the Last Year: Not on file  . Ran Out of Food in the Last Year: Not on file  Transportation Needs:   . Lack of Transportation (Medical): Not on file  . Lack of Transportation (Non-Medical): Not on file  Physical Activity:   . Days of Exercise per Week: Not on file  . Minutes of  Exercise per Session: Not on file  Stress:   . Feeling of Stress : Not on file  Social Connections:   . Frequency of Communication with Friends and Family: Not on file  . Frequency of Social Gatherings with Friends and Family: Not on file  . Attends Religious Services: Not on file  . Active Member of Clubs or Organizations: Not on file  . Attends Archivist Meetings: Not on file  . Marital Status: Not on file  Intimate Partner Violence:   . Fear of Current or Ex-Partner: Not on file  . Emotionally Abused: Not on file  . Physically Abused: Not on file  . Sexually Abused: Not on file    Outpatient Encounter Medications as of 12/12/2019  Medication Sig  .  atorvastatin (LIPITOR) 20 MG tablet TAKE 1 TABLET BY MOUTH EVERY DAY  . celecoxib (CELEBREX) 200 MG capsule Take 1 capsule (200 mg total) by mouth 2 (two) times daily.  . cephALEXin (KEFLEX) 500 MG capsule Take 1 capsule (500 mg total) by mouth 3 (three) times daily.  . furosemide (LASIX) 80 MG tablet Take 1 tablet (80 mg total) by mouth daily for 30 days.  Marland Kitchen gabapentin (NEURONTIN) 300 MG capsule Take 300 mg by mouth daily.  Marland Kitchen glucose blood test strip Check BS daily and as needed Dx E11.9  . levothyroxine (SYNTHROID) 200 MCG tablet TAKE 1 TABLET (200 MCG TOTAL) BY MOUTH DAILY BEFORE BREAKFAST (TAKE IN ADDITION TO 50MCG TABLET)  . levothyroxine (SYNTHROID) 50 MCG tablet TAKE 1 TABLET DAILY BEFORE BREAKFAST. (TAKE IN ADDITION TO 200MCG TABLET FOR TOTAL DOSE 250 MCG)  . metFORMIN (GLUCOPHAGE-XR) 750 MG 24 hr tablet TAKE 1 TABLET BY MOUTH EVERY DAY WITH BREAKFAST  . metoprolol tartrate (LOPRESSOR) 100 MG tablet Take 2 tablets (200 mg total) by mouth daily.  . Multiple Vitamin (MULTIVITAMIN) tablet Take 1 tablet by mouth 2 (two) times daily.  . Prodigy Lancets 28G MISC Check BS daily and as needed Dx E11.9  . valsartan (DIOVAN) 80 MG tablet Take 1 tablet (80 mg total) by mouth daily.  Alveda Reasons 20 MG TABS tablet TAKE 1 TABLET (20 MG TOTAL) BY MOUTH DAILY WITH SUPPER FOR 30 DAYS.  . [DISCONTINUED] gabapentin (NEURONTIN) 100 MG capsule Take 3 capsules (300 mg total) by mouth 3 (three) times daily.  . [DISCONTINUED] metFORMIN (GLUCOPHAGE-XR) 750 MG 24 hr tablet TAKE 1 TABLET BY MOUTH EVERY DAY WITH BREAKFAST  . [DISCONTINUED] metoprolol tartrate (LOPRESSOR) 100 MG tablet Take 200 mg by mouth daily.  . [DISCONTINUED] valsartan (DIOVAN) 80 MG tablet TAKE 1 TABLET BY MOUTH EVERY DAY  . spironolactone (ALDACTONE) 25 MG tablet Take 1 tablet (25 mg total) by mouth daily.  . [DISCONTINUED] methocarbamol (ROBAXIN) 750 MG tablet Take 1 tablet (750 mg total) by mouth 3 (three) times daily.  . [DISCONTINUED]  spironolactone (ALDACTONE) 25 MG tablet Take 1 tablet (25 mg total) by mouth daily. (Patient not taking: Reported on 12/12/2019)   No facility-administered encounter medications on file as of 12/12/2019.    Allergies  Allergen Reactions  . Morphine And Related Nausea And Vomiting    Review of Systems  Constitutional: Negative for activity change, appetite change, chills, diaphoresis, fatigue, fever and unexpected weight change.  HENT: Negative.   Eyes: Negative.  Negative for photophobia and visual disturbance.  Respiratory: Negative for cough, chest tightness and shortness of breath.   Cardiovascular: Positive for leg swelling. Negative for chest pain and palpitations.  Gastrointestinal: Negative for abdominal pain,  blood in stool, constipation, diarrhea, nausea and vomiting.  Endocrine: Negative.  Negative for cold intolerance, heat intolerance, polydipsia, polyphagia and polyuria.  Genitourinary: Negative for decreased urine volume, difficulty urinating, dysuria, frequency and urgency.  Musculoskeletal: Positive for arthralgias and myalgias.  Skin: Positive for color change and wound (healing wound to left heel).  Allergic/Immunologic: Negative.   Neurological: Negative for dizziness, tremors, seizures, syncope, facial asymmetry, speech difficulty, weakness, light-headedness, numbness and headaches.  Hematological: Negative.   Psychiatric/Behavioral: Negative for confusion, hallucinations, sleep disturbance and suicidal ideas.  All other systems reviewed and are negative.       Objective:  BP 110/64   Pulse 78   Temp (!) 97.5 F (36.4 C) (Temporal)   Ht '6\' 3"'$  (1.905 m)   Wt (!) 390 lb 2 oz (177 kg)   BMI 48.76 kg/m    Wt Readings from Last 3 Encounters:  12/12/19 (!) 390 lb 2 oz (177 kg)  11/06/19 (!) 397 lb (180.1 kg)  10/31/19 (!) 393 lb (178.3 kg)    Physical Exam Vitals and nursing note reviewed.  Constitutional:      General: He is not in acute distress.     Appearance: Normal appearance. He is well-developed and well-groomed. He is morbidly obese. He is not ill-appearing, toxic-appearing or diaphoretic.  HENT:     Head: Normocephalic and atraumatic.     Jaw: There is normal jaw occlusion.     Right Ear: Hearing normal.     Left Ear: Hearing normal.     Nose: Nose normal.     Mouth/Throat:     Lips: Pink.     Mouth: Mucous membranes are moist.     Pharynx: Oropharynx is clear. Uvula midline.  Eyes:     General: Lids are normal.     Extraocular Movements: Extraocular movements intact.     Conjunctiva/sclera: Conjunctivae normal.     Pupils: Pupils are equal, round, and reactive to light.  Neck:     Thyroid: No thyroid mass, thyromegaly or thyroid tenderness.     Vascular: No carotid bruit or JVD.     Trachea: Trachea and phonation normal.  Cardiovascular:     Rate and Rhythm: Normal rate and regular rhythm.     Chest Wall: PMI is not displaced.     Pulses: Normal pulses.     Heart sounds: Normal heart sounds. No murmur. No friction rub. No gallop.   Pulmonary:     Effort: Pulmonary effort is normal. No respiratory distress.     Breath sounds: Normal breath sounds. No wheezing.  Abdominal:     General: Bowel sounds are normal. There is no distension or abdominal bruit.     Palpations: Abdomen is soft. There is no hepatomegaly or splenomegaly.     Tenderness: There is no abdominal tenderness. There is no right CVA tenderness or left CVA tenderness.     Hernia: No hernia is present.  Musculoskeletal:        General: Normal range of motion.     Cervical back: Normal range of motion and neck supple.     Right lower leg: No edema.     Left lower leg: No edema.  Lymphadenopathy:     Cervical: No cervical adenopathy.  Skin:    General: Skin is warm and dry.     Capillary Refill: Capillary refill takes less than 2 seconds.     Coloration: Skin is not cyanotic, jaundiced or pale.     Findings: No rash.  Comments: Healing wound  to left heal  Neurological:     General: No focal deficit present.     Mental Status: He is alert and oriented to person, place, and time.     Cranial Nerves: Cranial nerves are intact. No cranial nerve deficit.     Sensory: Sensation is intact. No sensory deficit.     Motor: Motor function is intact. No weakness.     Coordination: Coordination is intact. Coordination normal.     Gait: Gait is intact. Gait normal.     Deep Tendon Reflexes: Reflexes are normal and symmetric. Reflexes normal.  Psychiatric:        Attention and Perception: Attention and perception normal.        Mood and Affect: Mood and affect normal.        Speech: Speech normal.        Behavior: Behavior normal. Behavior is cooperative.        Thought Content: Thought content normal.        Cognition and Memory: Cognition and memory normal.        Judgment: Judgment normal.     Results for orders placed or performed in visit on 10/31/19  CBC with Differential/Platelet  Result Value Ref Range   WBC 7.1 3.4 - 10.8 x10E3/uL   RBC 4.45 4.14 - 5.80 x10E6/uL   Hemoglobin 13.0 13.0 - 17.7 g/dL   Hematocrit 39.9 37.5 - 51.0 %   MCV 90 79 - 97 fL   MCH 29.2 26.6 - 33.0 pg   MCHC 32.6 31.5 - 35.7 g/dL   RDW 12.9 11.6 - 15.4 %   Platelets 238 150 - 450 x10E3/uL   Neutrophils 52 Not Estab. %   Lymphs 33 Not Estab. %   Monocytes 11 Not Estab. %   Eos 3 Not Estab. %   Basos 1 Not Estab. %   Neutrophils Absolute 3.7 1.4 - 7.0 x10E3/uL   Lymphocytes Absolute 2.4 0.7 - 3.1 x10E3/uL   Monocytes Absolute 0.8 0.1 - 0.9 x10E3/uL   EOS (ABSOLUTE) 0.2 0.0 - 0.4 x10E3/uL   Basophils Absolute 0.1 0.0 - 0.2 x10E3/uL   Immature Granulocytes 0 Not Estab. %   Immature Grans (Abs) 0.0 0.0 - 0.1 x10E3/uL  CMP14+EGFR  Result Value Ref Range   Glucose 233 (H) 65 - 99 mg/dL   BUN 16 8 - 27 mg/dL   Creatinine, Ser 1.05 0.76 - 1.27 mg/dL   GFR calc non Af Amer 74 >59 mL/min/1.73   GFR calc Af Amer 86 >59 mL/min/1.73   BUN/Creatinine  Ratio 15 10 - 24   Sodium 144 134 - 144 mmol/L   Potassium 4.4 3.5 - 5.2 mmol/L   Chloride 102 96 - 106 mmol/L   CO2 27 20 - 29 mmol/L   Calcium 9.3 8.6 - 10.2 mg/dL   Total Protein 7.4 6.0 - 8.5 g/dL   Albumin 4.4 3.8 - 4.8 g/dL   Globulin, Total 3.0 1.5 - 4.5 g/dL   Albumin/Globulin Ratio 1.5 1.2 - 2.2   Bilirubin Total 0.8 0.0 - 1.2 mg/dL   Alkaline Phosphatase 92 39 - 117 IU/L   AST 21 0 - 40 IU/L   ALT 18 0 - 44 IU/L  Lipid panel  Result Value Ref Range   Cholesterol, Total 97 (L) 100 - 199 mg/dL   Triglycerides 112 0 - 149 mg/dL   HDL 37 (L) >39 mg/dL   VLDL Cholesterol Cal 21 5 - 40 mg/dL  LDL Chol Calc (NIH) 39 0 - 99 mg/dL   Chol/HDL Ratio 2.6 0.0 - 5.0 ratio  Thyroid Panel With TSH  Result Value Ref Range   TSH 0.392 (L) 0.450 - 4.500 uIU/mL   T4, Total 8.8 4.5 - 12.0 ug/dL   T3 Uptake Ratio 27 24 - 39 %   Free Thyroxine Index 2.4 1.2 - 4.9  Bayer DCA Hb A1c Waived  Result Value Ref Range   HB A1C (BAYER DCA - WAIVED) 6.5 <7.0 %       Pertinent labs & imaging results that were available during my care of the patient were reviewed by me and considered in my medical decision making.  Assessment & Plan:  [place] was seen today for cellulitis.  Diagnoses and all orders for this visit:  Cellulitis of left lower extremity Great improvement. Completed full course of Keflex. Report any new, worsening, or persistent symptoms.   Hypertension associated with diabetes ([place]) BP well controlled. Changes were not made in regimen today. Goal BP is 130/80. Pt aware to report any persistent high or low readings. DASH diet and exercise encouraged. Exercise at least 150 minutes per week and increase as tolerated. Goal BMI > 25. Stress management encouraged. Avoid nicotine and tobacco product use. Avoid excessive alcohol and NSAID's. Avoid more than 2000 mg of sodium daily. Medications as prescribed. Follow up as scheduled.  -     spironolactone (ALDACTONE) 25 MG tablet; Take 1  tablet (25 mg total) by mouth daily. -     metoprolol tartrate (LOPRESSOR) 100 MG tablet; Take 2 tablets (200 mg total) by mouth daily. -     valsartan (DIOVAN) 80 MG tablet; Take 1 tablet (80 mg total) by mouth daily.  Type 2 diabetes mellitus without complication, without long-term current use of insulin ([facility]) Refill on metformin today. Diet and exercise encouraged.  -     metFORMIN (GLUCOPHAGE-XR) 750 MG 24 hr tablet; TAKE 1 TABLET BY MOUTH EVERY DAY WITH BREAKFAST     Continue all other maintenance medications.  Follow up plan: Return in about 3 months (around 03/13/2020), or if symptoms worsen or fail to improve, for DM.  Continue healthy lifestyle choices, including diet (rich in fruits, vegetables, and lean proteins, and low in salt and simple carbohydrates) and exercise (at least 30 minutes of moderate physical activity daily).  Educational handout given for cellulitis  The above assessment and management plan was discussed with the patient. The patient verbalized understanding of and has agreed to the management plan. Patient is aware to call the clinic if they develop any new symptoms or if symptoms persist or worsen. Patient is aware when to return to the clinic for a follow-up visit. Patient educated on when it is appropriate to go to the emergency department.   Monia Pouch, FNP-C [facility] [phone]

## 2019-12-12 NOTE — Patient Instructions (Signed)

## 2019-12-18 ENCOUNTER — Other Ambulatory Visit: Payer: Self-pay | Admitting: Family Medicine

## 2019-12-18 DIAGNOSIS — E785 Hyperlipidemia, unspecified: Secondary | ICD-10-CM

## 2019-12-18 DIAGNOSIS — E1169 Type 2 diabetes mellitus with other specified complication: Secondary | ICD-10-CM

## 2019-12-20 ENCOUNTER — Telehealth: Payer: Self-pay | Admitting: Family Medicine

## 2019-12-20 ENCOUNTER — Other Ambulatory Visit: Payer: Self-pay | Admitting: Family Medicine

## 2019-12-20 ENCOUNTER — Ambulatory Visit: Payer: Medicare Other | Admitting: Nutrition

## 2019-12-20 DIAGNOSIS — G4733 Obstructive sleep apnea (adult) (pediatric): Secondary | ICD-10-CM

## 2019-12-20 NOTE — Telephone Encounter (Signed)
Pt called stating that he needs Dr [name] to put in an order for him to get a new CPAP machine. Says is current one isnt working anymore.

## 2019-12-20 NOTE — Telephone Encounter (Signed)
He will need referral to [facility] neurology. I will place this.

## 2019-12-20 NOTE — Telephone Encounter (Signed)
May need referral to pulmonolgy

## 2019-12-20 NOTE — Telephone Encounter (Signed)
Pt aware.

## 2019-12-25 ENCOUNTER — Encounter: Payer: Self-pay | Admitting: *Deleted

## 2020-01-01 ENCOUNTER — Other Ambulatory Visit: Payer: Self-pay | Admitting: "Endocrinology

## 2020-01-16 ENCOUNTER — Other Ambulatory Visit: Payer: Self-pay | Admitting: Family Medicine

## 2020-01-26 ENCOUNTER — Other Ambulatory Visit: Payer: Self-pay | Admitting: "Endocrinology

## 2020-01-30 ENCOUNTER — Ambulatory Visit: Payer: Medicare Other | Admitting: Nurse Practitioner

## 2020-01-30 ENCOUNTER — Ambulatory Visit: Payer: Medicare Other | Admitting: Family Medicine

## 2020-01-30 ENCOUNTER — Encounter: Payer: Self-pay | Admitting: Nurse Practitioner

## 2020-01-30 ENCOUNTER — Other Ambulatory Visit: Payer: Self-pay

## 2020-01-30 VITALS — BP 125/73 | HR 70 | Temp 97.9°F | Ht 75.0 in | Wt 385.6 lb

## 2020-01-30 DIAGNOSIS — I5032 Chronic diastolic (congestive) heart failure: Secondary | ICD-10-CM | POA: Diagnosis not present

## 2020-01-30 DIAGNOSIS — I10 Essential (primary) hypertension: Secondary | ICD-10-CM

## 2020-01-30 DIAGNOSIS — I152 Hypertension secondary to endocrine disorders: Secondary | ICD-10-CM

## 2020-01-30 DIAGNOSIS — E785 Hyperlipidemia, unspecified: Secondary | ICD-10-CM

## 2020-01-30 DIAGNOSIS — B354 Tinea corporis: Secondary | ICD-10-CM

## 2020-01-30 DIAGNOSIS — E119 Type 2 diabetes mellitus without complications: Secondary | ICD-10-CM

## 2020-01-30 DIAGNOSIS — E1159 Type 2 diabetes mellitus with other circulatory complications: Secondary | ICD-10-CM

## 2020-01-30 DIAGNOSIS — I4821 Permanent atrial fibrillation: Secondary | ICD-10-CM

## 2020-01-30 DIAGNOSIS — E1169 Type 2 diabetes mellitus with other specified complication: Secondary | ICD-10-CM

## 2020-01-30 DIAGNOSIS — I872 Venous insufficiency (chronic) (peripheral): Secondary | ICD-10-CM

## 2020-01-30 DIAGNOSIS — M19012 Primary osteoarthritis, left shoulder: Secondary | ICD-10-CM

## 2020-01-30 DIAGNOSIS — E1141 Type 2 diabetes mellitus with diabetic mononeuropathy: Secondary | ICD-10-CM

## 2020-01-30 DIAGNOSIS — G4733 Obstructive sleep apnea (adult) (pediatric): Secondary | ICD-10-CM

## 2020-01-30 DIAGNOSIS — E890 Postprocedural hypothyroidism: Secondary | ICD-10-CM

## 2020-01-30 DIAGNOSIS — Z6841 Body Mass Index (BMI) 40.0 and over, adult: Secondary | ICD-10-CM

## 2020-01-30 LAB — BAYER DCA HB A1C WAIVED: HB A1C (BAYER DCA - WAIVED): 6.8 % (ref <7.0)

## 2020-01-30 MED ORDER — RIVAROXABAN 20 MG PO TABS: ORAL_TABLET | ORAL | 1 refills | Status: DC

## 2020-01-30 MED ORDER — TERBINAFINE HCL 1 % EX CREA: 1.0000 "application " | TOPICAL_CREAM | Freq: Two times a day (BID) | CUTANEOUS | 0 refills | Status: DC

## 2020-01-30 MED ORDER — FUROSEMIDE 80 MG PO TABS: 80.0000 mg | ORAL_TABLET | Freq: Every day | ORAL | 1 refills | Status: DC

## 2020-01-30 MED ORDER — ATORVASTATIN CALCIUM 20 MG PO TABS: 20.0000 mg | ORAL_TABLET | Freq: Every day | ORAL | 1 refills | Status: DC

## 2020-01-30 MED ORDER — METFORMIN HCL ER 750 MG PO TB24: ORAL_TABLET | ORAL | 1 refills | Status: DC

## 2020-01-30 MED ORDER — SPIRONOLACTONE 25 MG PO TABS: 25.0000 mg | ORAL_TABLET | Freq: Every day | ORAL | 3 refills | Status: DC

## 2020-01-30 MED ORDER — METOPROLOL TARTRATE 100 MG PO TABS: 200.0000 mg | ORAL_TABLET | Freq: Every day | ORAL | 1 refills | Status: DC

## 2020-01-30 MED ORDER — GABAPENTIN 300 MG PO CAPS: 300.0000 mg | ORAL_CAPSULE | Freq: Every day | ORAL | 1 refills | Status: DC

## 2020-01-30 MED ORDER — VALSARTAN 80 MG PO TABS: 80.0000 mg | ORAL_TABLET | Freq: Every day | ORAL | 3 refills | Status: DC

## 2020-01-30 NOTE — Progress Notes (Signed)
Subjective:    Patient ID: Richard Crawford, male    DOB: 06-15-1956, 65 y.o.   MRN: [id number]   Chief Complaint: Medical Management of Chronic Issues ([facility]kes patient )    HPI:  1. Hypertension associated with diabetes (Richard Crawford) Does not check BP at home. Is trying to watch sodium in diet. Denies chest pain, sob, headaches. BP Readings from Last 3 Encounters:  01/30/20 125/73  12/12/19 110/64  10/31/19 116/79     2. Hyperlipidemia associated with type 2 diabetes mellitus (Richard Crawford) Is trying to watch fat and cholesterol intake. Does not exercise.  Lab Results  Component Value Date   CHOL 97 (L) 10/31/2019   HDL 37 (L) 10/31/2019   LDLCALC 39 10/31/2019   TRIG 112 10/31/2019   CHOLHDL 2.6 10/31/2019     3. Type 2 diabetes mellitus without complication, without long-term current use of insulin (Richard Crawford) Is trying to watch carb intake. Denies numbness, tingling in feet. Saw eye doctor about 6 months ago and got a new prescription. States that sometimes his vision seems a little blurry. Checks blood sugar daily and it ranges from 110s-160s. Denies symptoms of hypoglycemia. Lab Results  Component Value Date   HGBA1C 6.5 10/31/2019    4. Chronic heart failure with preserved ejection fraction [facility]) Sees cardiologist. Last appointment was last year. No problems at this time. Takes lasix daily.  5. Permanent atrial fibrillation (Richard Crawford) No complications. Takes xarelto daily with no complications.  6. Obstructive sleep apnea Uses CPAP machine every night. Feels rested using it.  7. Postoperative hypothyroidism Sees endocrinologist for thyroid medication. Is still working to get the right dose. Lab Results  Component Value Date   TSH 0.392 (L) 10/31/2019    8. Diabetic mononeuropathy associated with type 2 diabetes mellitus (Richard Crawford) Nerve pain in both legs but gabapentin is helping  9. Venous stasis dermatitis of both lower extremities Saw vascular surgeon in [place] before moving here who  wanted to remove veins in his legs but he did not have the surgery. Has not seen anyone else since. Cannot tolerate wearing compression socks. Does elevate legs when sitting if they are swollen.  10. Morbid obesity with BMI of 50.0-59.9, adult (Richard Crawford) Is trying to watch diet. No significant changes in weight. Has lost 5 pounds since last visit.  BMI Readings from Last 3 Encounters:  01/30/20 48.20 kg/m  12/12/19 48.76 kg/m  11/06/19 49.62 kg/m   Wt Readings from Last 3 Encounters:  01/30/20 (!) 385 lb 9.6 oz (174.9 kg)  12/12/19 (!) 390 lb 2 oz (177 kg)  11/06/19 (!) 397 lb (180.1 kg)      Outpatient Encounter Medications as of 01/30/2020  Medication Sig  . atorvastatin (LIPITOR) 20 MG tablet TAKE 1 TABLET DAILY  . gabapentin (NEURONTIN) 300 MG capsule Take 300 mg by mouth daily.  Marland Kitchen glucose blood test strip Check BS daily and as needed Dx E11.9  . levothyroxine (SYNTHROID) 200 MCG tablet TAKE 1 TABLET (200 MCG TOTAL) BY MOUTH DAILY BEFORE BREAKFAST (TAKE IN ADDITION TO 50MCG TABLET)  . levothyroxine (SYNTHROID) 50 MCG tablet TAKE 1 TABLET DAILY BEFORE BREAKFAST. (TAKE IN ADDITION TO 200MCG TABLET FOR TOTAL DOSE 250 MCG)  . metFORMIN (GLUCOPHAGE-XR) 750 MG 24 hr tablet TAKE 1 TABLET BY MOUTH EVERY DAY WITH BREAKFAST  . metoprolol tartrate (LOPRESSOR) 100 MG tablet Take 2 tablets (200 mg total) by mouth daily.  . Multiple Vitamin (MULTIVITAMIN) tablet Take 1 tablet by mouth 2 (two) times daily.  . Prodigy  Lancets 28G MISC Check BS daily and as needed Dx E11.9  . SANTYL ointment APPLY ONCE A DAY TO WOUNDSON HEELE  . valsartan (DIOVAN) 80 MG tablet Take 1 tablet (80 mg total) by mouth daily.  Alveda Reasons 20 MG TABS tablet TAKE 1 TABLET (20 MG TOTAL) BY MOUTH DAILY WITH SUPPER FOR 30 DAYS.  . [DISCONTINUED] cephALEXin (KEFLEX) 500 MG capsule Take 1 capsule (500 mg total) by mouth 3 (three) times daily.  . furosemide (LASIX) 80 MG tablet Take 1 tablet (80 mg total) by mouth daily for 30 days.   Marland Kitchen spironolactone (ALDACTONE) 25 MG tablet Take 1 tablet (25 mg total) by mouth daily.   No facility-administered encounter medications on file as of 01/30/2020.    Past Surgical History:  Procedure Laterality Date  . ANKLE FUSION    . KNEE ARTHROSCOPY    . SHOULDER ARTHROSCOPY    . TOTAL SHOULDER REPLACEMENT Right     Family History  Problem Relation Age of Onset  . Cancer Mother        breast, stomach, ovarian  . Hypertension Mother   . Dementia Mother   . Heart disease Father     New complaints: Multiple circular erythematous scaly patches on left upper thigh. Denies pruritis, tenderness.  Social history: Lives with wife.  Controlled substance contract: N/A    Review of Systems  Constitutional: Negative.   HENT: Negative.   Eyes: Positive for visual disturbance (blurry vision at times).  Respiratory: Negative.   Cardiovascular: Positive for leg swelling.  Gastrointestinal: Negative.   Endocrine: Negative.   Genitourinary: Negative.   Musculoskeletal: Positive for arthralgias (right shoulder).  Skin: Positive for rash (erythematous circular scaly patches L upper thigh).  Neurological: Negative.   Hematological: Bruises/bleeds easily.  Psychiatric/Behavioral: Negative.        Objective:   Physical Exam Vitals and nursing note reviewed.  Constitutional:      Appearance: He is obese.  HENT:     Head: Normocephalic.     Right Ear: Tympanic membrane normal.     Left Ear: Tympanic membrane normal.     Nose: Nose normal.     Mouth/Throat:     Mouth: Mucous membranes are moist.     Pharynx: Oropharynx is clear.  Eyes:     Conjunctiva/sclera: Conjunctivae normal.     Pupils: Pupils are equal, round, and reactive to light.  Cardiovascular:     Rate and Rhythm: Normal rate. Rhythm irregular.     Pulses: Normal pulses.     Heart sounds: Normal heart sounds.  Pulmonary:     Effort: Pulmonary effort is normal.     Breath sounds: Normal breath sounds.    Abdominal:     General: Bowel sounds are normal.     Palpations: Abdomen is soft.  Musculoskeletal:     Cervical back: Normal range of motion.     Right lower leg: 2+ Edema present.     Left lower leg: 2+ Edema present.  Skin:    General: Skin is warm and dry.     Capillary Refill: Capillary refill takes less than 2 seconds.     Findings: Rash (multiple erythematous circular scaly patches on left upper thigh) present. Rash is scaling.  Neurological:     Mental Status: He is alert and oriented to person, place, and time.  Psychiatric:        Behavior: Behavior normal.    BP 125/73   Pulse 70   Temp 97.9 F (  36.6 C) (Temporal)   Ht '6\' 3"'$  (1.905 m)   Wt (!) 385 lb 9.6 oz (174.9 kg)   SpO2 95%   BMI 48.20 kg/m       Assessment & Plan:  Zachrey Deutscher comes in today with chief complaint of Medical Management of Chronic Issues (rakes patient )   Diagnosis and orders addressed:  1. Hypertension associated with diabetes (Richard Crawford) Low sodium diet - CBC with Differential/Platelet - CMP14+EGFR - valsartan (DIOVAN) 80 MG tablet; Take 1 tablet (80 mg total) by mouth daily.  Dispense: 90 tablet; Refill: 3 - spironolactone (ALDACTONE) 25 MG tablet; Take 1 tablet (25 mg total) by mouth daily.  Dispense: 90 tablet; Refill: 3 - metoprolol tartrate (LOPRESSOR) 100 MG tablet; Take 2 tablets (200 mg total) by mouth daily.  Dispense: 180 tablet; Refill: 1 - furosemide (LASIX) 80 MG tablet; Take 1 tablet (80 mg total) by mouth daily.  Dispense: 90 tablet; Refill: 1  2. Hyperlipidemia associated with type 2 diabetes mellitus (Richard Crawford) Low fat diet - Lipid panel - atorvastatin (LIPITOR) 20 MG tablet; Take 1 tablet (20 mg total) by mouth daily.  Dispense: 90 tablet; Refill: 1  3. Type 2 diabetes mellitus without complication, without long-term current use of insulin (Richard Crawford) Watch carbs Follow up with podiatrist for wound on left heel - Microalbumin / creatinine urine ratio - Bayer DCA Hb A1c  Waived - metFORMIN (GLUCOPHAGE-XR) 750 MG 24 hr tablet; TAKE 1 TABLET BY MOUTH EVERY DAY WITH BREAKFAST  Dispense: 90 tablet; Refill: 1  4. Chronic heart failure with preserved ejection fraction (Richard Crawford) Follow up with cardiology prn  5. Permanent atrial fibrillation (Richard Crawford) Follow up with cardiology prn - rivaroxaban (XARELTO) 20 MG TABS tablet; TAKE 1 TABLET (20 MG TOTAL) BY MOUTH DAILY WITH SUPPER FOR 30 DAYS.  Dispense: 90 tablet; Refill: 1  6. Obstructive sleep apnea Continue using cpap machine every night  7. Postoperative hypothyroidism Follow up with endocrinology Continue synthroid daily as ordered  8. Diabetic mononeuropathy associated with type 2 diabetes mellitus (Richard Crawford) Keep legs warm at night - gabapentin (NEURONTIN) 300 MG capsule; Take 1 capsule (300 mg total) by mouth daily.  Dispense: 90 capsule; Refill: 1  9. Venous stasis dermatitis of both lower extremities Elevate legs when sitting  10. Morbid obesity with BMI of 50.0-59.9, adult (Richard Crawford) Discussed diet and exercise for person with BMI >25 Will recheck weight in 3-6 months  11. Tinea corporis Tinea corporis vs psoriasis - terbinafine (LAMISIL AT) 1 % cream; Apply 1 application topically 2 (two) times daily.  Dispense: 30 g; Refill: 0   Labs pending Health Maintenance reviewed Diet and exercise encouraged  Follow up plan: 3 months   Mary-Margaret Hassell Done, FNP

## 2020-01-30 NOTE — Patient Instructions (Signed)
Diabetes Mellitus and Foot Care Foot care is an important part of your health, especially when you have diabetes. Diabetes may cause you to have problems because of poor blood flow (circulation) to your feet and legs, which can cause your skin to:  Become thinner and drier.  Break more easily.  Heal more slowly.  Peel and crack. You may also have nerve damage (neuropathy) in your legs and feet, causing decreased feeling in them. This means that you may not notice minor injuries to your feet that could lead to more serious problems. Noticing and addressing any potential problems early is the best way to prevent future foot problems. How to care for your feet Foot hygiene  Wash your feet daily with warm water and mild soap. Do not use hot water. Then, pat your feet and the areas between your toes until they are completely dry. Do not soak your feet as this can dry your skin.  Trim your toenails straight across. Do not dig under them or around the cuticle. File the edges of your nails with an emery board or nail file.  Apply a moisturizing lotion or petroleum jelly to the skin on your feet and to dry, brittle toenails. Use lotion that does not contain alcohol and is unscented. Do not apply lotion between your toes. Shoes and socks  Wear clean socks or stockings every day. Make sure they are not too tight. Do not wear knee-high stockings since they may decrease blood flow to your legs.  Wear shoes that fit properly and have enough cushioning. Always look in your shoes before you put them on to be sure there are no objects inside.  To break in new shoes, wear them for just a few hours a day. This prevents injuries on your feet. Wounds, scrapes, corns, and calluses  Check your feet daily for blisters, cuts, bruises, sores, and redness. If you cannot see the bottom of your feet, use a mirror or ask someone for help.  Do not cut corns or calluses or try to remove them with medicine.  If you  find a minor scrape, cut, or break in the skin on your feet, keep it and the skin around it clean and dry. You may clean these areas with mild soap and water. Do not clean the area with peroxide, alcohol, or iodine.  If you have a wound, scrape, corn, or callus on your foot, look at it several times a day to make sure it is healing and not infected. Check for: ? Redness, swelling, or pain. ? Fluid or blood. ? Warmth. ? Pus or a bad smell. General instructions  Do not cross your legs. This may decrease blood flow to your feet.  Do not use heating pads or hot water bottles on your feet. They may burn your skin. If you have lost feeling in your feet or legs, you may not know this is happening until it is too late.  Protect your feet from hot and cold by wearing shoes, such as at the beach or on hot pavement.  Schedule a complete foot exam at least once a year (annually) or more often if you have foot problems. If you have foot problems, report any cuts, sores, or bruises to your health care provider immediately. Contact a health care provider if:  You have a medical condition that increases your risk of infection and you have any cuts, sores, or bruises on your feet.  You have an injury that is not   healing.  You have redness on your legs or feet.  You feel burning or tingling in your legs or feet.  You have pain or cramps in your legs and feet.  Your legs or feet are numb.  Your feet always feel cold.  You have pain around a toenail. Get help right away if:  You have a wound, scrape, corn, or callus on your foot and: ? You have pain, swelling, or redness that gets worse. ? You have fluid or blood coming from the wound, scrape, corn, or callus. ? Your wound, scrape, corn, or callus feels warm to the touch. ? You have pus or a bad smell coming from the wound, scrape, corn, or callus. ? You have a fever. ? You have a red line going up your leg. Summary  Check your feet every day  for cuts, sores, red spots, swelling, and blisters.  Moisturize feet and legs daily.  Wear shoes that fit properly and have enough cushioning.  If you have foot problems, report any cuts, sores, or bruises to your health care provider immediately.  Schedule a complete foot exam at least once a year (annually) or more often if you have foot problems. This information is not intended to replace advice given to you by your health care provider. Make sure you discuss any questions you have with your health care provider. Document Revised: 06/20/2019 Document Reviewed: 10/29/2016 Elsevier Patient Education  2020 Elsevier Inc.  

## 2020-01-31 LAB — CMP14+EGFR
ALT: 21 IU/L (ref 0–44)
AST: 22 IU/L (ref 0–40)
Albumin/Globulin Ratio: 1.3 (ref 1.2–2.2)
Albumin: 4.4 g/dL (ref 3.8–4.8)
Alkaline Phosphatase: 107 IU/L (ref 39–117)
BUN/Creatinine Ratio: 17 (ref 10–24)
BUN: 17 mg/dL (ref 8–27)
Bilirubin Total: 0.7 mg/dL (ref 0.0–1.2)
CO2: 30 mmol/L — ABNORMAL HIGH (ref 20–29)
Calcium: 9.7 mg/dL (ref 8.6–10.2)
Chloride: 104 mmol/L (ref 96–106)
Creatinine, Ser: 0.99 mg/dL (ref 0.76–1.27)
GFR calc Af Amer: 92 mL/min/{1.73_m2} (ref >59)
GFR calc non Af Amer: 80 mL/min/{1.73_m2} (ref >59)
Globulin, Total: 3.3 g/dL (ref 1.5–4.5)
Glucose: 150 mg/dL — ABNORMAL HIGH (ref 65–99)
Potassium: 4.6 mmol/L (ref 3.5–5.2)
Sodium: 142 mmol/L (ref 134–144)
Total Protein: 7.7 g/dL (ref 6.0–8.5)

## 2020-01-31 LAB — LIPID PANEL
Chol/HDL Ratio: 2.5 ratio (ref 0.0–5.0)
Cholesterol, Total: 87 mg/dL — ABNORMAL LOW (ref 100–199)
HDL: 35 mg/dL — ABNORMAL LOW (ref >39)
LDL Chol Calc (NIH): 27 mg/dL (ref 0–99)
Triglycerides: 145 mg/dL (ref 0–149)
VLDL Cholesterol Cal: 25 mg/dL (ref 5–40)

## 2020-01-31 LAB — CBC WITH DIFFERENTIAL/PLATELET
Basophils Absolute: 0.1 10*3/uL (ref 0.0–0.2)
Basos: 1 %
EOS (ABSOLUTE): 0.3 10*3/uL (ref 0.0–0.4)
Eos: 4 %
Hematocrit: 42.6 % (ref 37.5–51.0)
Hemoglobin: 13.7 g/dL (ref 13.0–17.7)
Immature Grans (Abs): 0 10*3/uL (ref 0.0–0.1)
Immature Granulocytes: 0 %
Lymphocytes Absolute: 2.3 10*3/uL (ref 0.7–3.1)
Lymphs: 32 %
MCH: 28.3 pg (ref 26.6–33.0)
MCHC: 32.2 g/dL (ref 31.5–35.7)
MCV: 88 fL (ref 79–97)
Monocytes Absolute: 1.2 10*3/uL — ABNORMAL HIGH (ref 0.1–0.9)
Monocytes: 17 %
Neutrophils Absolute: 3.3 10*3/uL (ref 1.4–7.0)
Neutrophils: 46 %
Platelets: 245 10*3/uL (ref 150–450)
RBC: 4.84 x10E6/uL (ref 4.14–5.80)
RDW: 12.7 % (ref 11.6–15.4)
WBC: 7.2 10*3/uL (ref 3.4–10.8)

## 2020-02-14 ENCOUNTER — Telehealth: Payer: Self-pay | Admitting: Nurse Practitioner

## 2020-02-14 NOTE — Telephone Encounter (Signed)
FYI no response necessary

## 2020-02-29 ENCOUNTER — Other Ambulatory Visit: Payer: Self-pay | Admitting: *Deleted

## 2020-02-29 DIAGNOSIS — I152 Hypertension secondary to endocrine disorders: Secondary | ICD-10-CM

## 2020-02-29 DIAGNOSIS — E1159 Type 2 diabetes mellitus with other circulatory complications: Secondary | ICD-10-CM

## 2020-05-02 ENCOUNTER — Ambulatory Visit: Payer: Medicare Other | Admitting: Nurse Practitioner

## 2020-05-05 ENCOUNTER — Ambulatory Visit (INDEPENDENT_AMBULATORY_CARE_PROVIDER_SITE_OTHER): Payer: Medicare Other | Admitting: Family Medicine

## 2020-05-05 ENCOUNTER — Other Ambulatory Visit: Payer: Self-pay

## 2020-05-05 ENCOUNTER — Encounter: Payer: Self-pay | Admitting: Family Medicine

## 2020-05-05 VITALS — BP 106/70 | HR 72 | Temp 97.7°F | Ht 75.0 in | Wt 381.2 lb

## 2020-05-05 DIAGNOSIS — E1169 Type 2 diabetes mellitus with other specified complication: Secondary | ICD-10-CM

## 2020-05-05 DIAGNOSIS — E119 Type 2 diabetes mellitus without complications: Secondary | ICD-10-CM | POA: Diagnosis not present

## 2020-05-05 DIAGNOSIS — I4821 Permanent atrial fibrillation: Secondary | ICD-10-CM | POA: Diagnosis not present

## 2020-05-05 DIAGNOSIS — E1159 Type 2 diabetes mellitus with other circulatory complications: Secondary | ICD-10-CM | POA: Diagnosis not present

## 2020-05-05 DIAGNOSIS — I152 Hypertension secondary to endocrine disorders: Secondary | ICD-10-CM

## 2020-05-05 DIAGNOSIS — G4733 Obstructive sleep apnea (adult) (pediatric): Secondary | ICD-10-CM | POA: Diagnosis not present

## 2020-05-05 DIAGNOSIS — I10 Essential (primary) hypertension: Secondary | ICD-10-CM

## 2020-05-05 DIAGNOSIS — E785 Hyperlipidemia, unspecified: Secondary | ICD-10-CM

## 2020-05-05 DIAGNOSIS — I5032 Chronic diastolic (congestive) heart failure: Secondary | ICD-10-CM

## 2020-05-05 DIAGNOSIS — Z125 Encounter for screening for malignant neoplasm of prostate: Secondary | ICD-10-CM

## 2020-05-05 DIAGNOSIS — E1141 Type 2 diabetes mellitus with diabetic mononeuropathy: Secondary | ICD-10-CM

## 2020-05-05 DIAGNOSIS — E890 Postprocedural hypothyroidism: Secondary | ICD-10-CM

## 2020-05-05 LAB — BAYER DCA HB A1C WAIVED: HB A1C (BAYER DCA - WAIVED): 6.4 % (ref <7.0)

## 2020-05-05 NOTE — Progress Notes (Signed)
Assessment & Plan:  1. Type 2 diabetes mellitus without complication, without long-term current use of insulin (HCC) Lab Results  Component Value Date   HGBA1C 6.4 05/05/2020   HGBA1C 6.8 01/30/2020   HGBA1C 6.5 10/31/2019  - Diabetes is at goal of A1c < 7. - Medications: continue current medications - Patient is currently taking a statin. Patient is taking an ACE-inhibitor/ARB.  - Last foot exam: 01/30/2020 - Last diabetic eye exam: 09/13/2019 - Urine Microalbumin/Creat Ratio: today - Bayer DCA Hb A1c Waived - CMP14+EGFR - Microalbumin / creatinine urine ratio  2. Obstructive sleep apnea - Well controlled with CPAP at home on a nightly basis.  3. Permanent atrial fibrillation (HCC) - Well controlled on current regimen. Managed by cardiology.   4. Hypertension associated with diabetes ([place]) - Well controlled on current regimen. Managed by cardiology.   5. Chronic heart failure with preserved ejection fraction ([place]) - Well controlled on current regimen. Managed by cardiology.   6. Postoperative hypothyroidism - Managed by endocrinology.   7. Hyperlipidemia associated with type 2 diabetes mellitus ([place]) - Well controlled on current regimen.   8. Diabetic mononeuropathy associated with type 2 diabetes mellitus ([place]) - Well controlled on current regimen.   9. Screening for prostate cancer - PSA, total and free   Return in about 3 months (around 08/05/2020) for follow-up of chronic medication conditions.  [name], MSN, APRN, FNP-C [facility]  Subjective:    Patient ID: Richard Crawford, male    DOB: 03-Dec-1955, 64 y.o.   MRN: [id number]  Patient Care Team: Loman Brooklyn, FNP as PCP - General (Family Medicine) Minus Breeding, MD as PCP - Cardiology (Cardiology)   Chief Complaint:  Chief Complaint  Patient presents with  . Diabetes    3 month follow up of chronic medical conditions  . Establish Care    [facility] pt     HPI: Richard Crawford is a 64 y.o. male presenting on 05/05/2020 for Diabetes (3 month follow up of chronic medical conditions) and Establish Care ([facility] pt )  Diabetes: Patient presents for follow up of diabetes.  Known diabetic complications: cardiovascular disease. Medication compliance: yes. Is he  on ACE inhibitor or angiotensin II receptor blocker? Yes. Is he on a statin? Yes.   Lab Results  Component Value Date   HGBA1C 6.4 05/05/2020   HGBA1C 6.8 01/30/2020   HGBA1C 6.5 10/31/2019   Lab Results  Component Value Date   LDLCALC 27 01/30/2020   CREATININE 1.07 05/05/2020     Patient is established with Dr. Percival Spanish who manages A-Fib, HTN, and CHF.  Patient does wear a CPAP nightly for sleep apnea which he bought himself. His last sleep study was done in [place].   Patient reports he had thyroid cancer in 1989. He was on a high dose of levothyroxine and was feeling great. Since that endocrinologist retired he has been unable to find anyone willing to give him the high dose he requires to feel good. He last saw Dr. Dorris Fetch who did give him 250 mcg and reports he is getting established with Dr. Buddy Duty on 08/11/2020.    Social history:  Relevant past medical, surgical, family and social history reviewed and updated as indicated. Interim medical history since our last visit reviewed.  Allergies and medications reviewed and updated.  DATA REVIEWED: CHART IN EPIC  ROS: Negative unless specifically indicated above in HPI.    Current Outpatient Medications:  .  atorvastatin (LIPITOR) 20 MG  tablet, Take 1 tablet (20 mg total) by mouth daily., Disp: 90 tablet, Rfl: 1 .  celecoxib (CELEBREX) 200 MG capsule, Take 200 mg by mouth 2 (two) times daily., Disp: , Rfl:  .  gabapentin (NEURONTIN) 300 MG capsule, Take 1 capsule (300 mg total) by mouth daily., Disp: 90 capsule, Rfl: 1 .  glucose blood test strip, Check BS daily and as needed Dx E11.9, Disp: 100 each, Rfl: 3 .  levothyroxine (SYNTHROID) 200 MCG tablet,  TAKE 1 TABLET (200 MCG TOTAL) BY MOUTH DAILY BEFORE BREAKFAST (TAKE IN ADDITION TO 50MCG TABLET), Disp: 30 tablet, Rfl: 0 .  levothyroxine (SYNTHROID) 50 MCG tablet, TAKE 1 TABLET DAILY BEFORE BREAKFAST. (TAKE IN ADDITION TO 200MCG TABLET FOR TOTAL DOSE 250 MCG), Disp: 30 tablet, Rfl: 0 .  metFORMIN (GLUCOPHAGE-XR) 750 MG 24 hr tablet, TAKE 1 TABLET BY MOUTH EVERY DAY WITH BREAKFAST, Disp: 90 tablet, Rfl: 1 .  Multiple Vitamin (MULTIVITAMIN) tablet, Take 1 tablet by mouth 2 (two) times daily., Disp: , Rfl:  .  Prodigy Lancets 28G MISC, Check BS daily and as needed Dx E11.9, Disp: 100 each, Rfl: 3 .  rivaroxaban (XARELTO) 20 MG TABS tablet, TAKE 1 TABLET (20 MG TOTAL) BY MOUTH DAILY WITH SUPPER FOR 30 DAYS., Disp: 90 tablet, Rfl: 1 .  SANTYL ointment, APPLY ONCE A DAY TO WOUNDSON HEELE, Disp: , Rfl:  .  terbinafine (LAMISIL AT) 1 % cream, Apply 1 application topically 2 (two) times daily., Disp: 30 g, Rfl: 0 .  valsartan (DIOVAN) 80 MG tablet, Take 1 tablet (80 mg total) by mouth daily., Disp: 90 tablet, Rfl: 3 .  furosemide (LASIX) 80 MG tablet, Take 1 tablet (80 mg total) by mouth daily., Disp: 90 tablet, Rfl: 1 .  metoprolol tartrate (LOPRESSOR) 100 MG tablet, Take 2 tablets (200 mg total) by mouth daily., Disp: 180 tablet, Rfl: 1   Allergies  Allergen Reactions  . Morphine And Related Nausea And Vomiting   Past Medical History:  Diagnosis Date  . Anxiety   . Arthritis    left shoulder  . Atrial fibrillation ([place])   . Cancer ([place]) 1989   thyroid  . Cellulitis   . Chronic heart failure with preserved ejection fraction ([place])   . Diabetes mellitus without complication ([place])   . Hemorrhoids    external  . Hx of colonic polyps   . Hyperlipidemia   . Hypertension   . Migraine   . Papillary thyroid carcinoma ([place])   . Postoperative hypothyroidism   . Primary osteoarthritis of left shoulder   . Sleep apnea   . Venous stasis dermatitis of both lower extremities     Past Surgical  History:  Procedure Laterality Date  . ANKLE FUSION    . KNEE ARTHROSCOPY    . SHOULDER ARTHROSCOPY    . TOTAL SHOULDER REPLACEMENT Right     Social History   Socioeconomic History  . Marital status: Married    Spouse name: Not on file  . Number of children: Not on file  . Years of education: Not on file  . Highest education level: Not on file  Occupational History  . Not on file  Tobacco Use  . Smoking status: Never Smoker  . Smokeless tobacco: Never Used  Substance and Sexual Activity  . Alcohol use: Not Currently  . Drug use: Never  . Sexual activity: Not on file  Other Topics Concern  . Not on file  Social History Narrative  . Not on file  Social Determinants of Health   Financial Resource Strain:   . Difficulty of Paying Living Expenses:   Food Insecurity:   . Worried About Charity fundraiser in the Last Year:   . Arboriculturist in the Last Year:   Transportation Needs:   . Film/video editor (Medical):   Marland Kitchen Lack of Transportation (Non-Medical):   Physical Activity:   . Days of Exercise per Week:   . Minutes of Exercise per Session:   Stress:   . Feeling of Stress :   Social Connections:   . Frequency of Communication with Friends and Family:   . Frequency of Social Gatherings with Friends and Family:   . Attends Religious Services:   . Active Member of Clubs or Organizations:   . Attends Archivist Meetings:   Marland Kitchen Marital Status:   Intimate Partner Violence:   . Fear of Current or Ex-Partner:   . Emotionally Abused:   Marland Kitchen Physically Abused:   . Sexually Abused:         Objective:    BP 106/70   Pulse 72   Temp 97.7 F (36.5 C) (Temporal)   Ht 6' 3" (1.905 m)   Wt (!) 381 lb 3.2 oz (172.9 kg)   SpO2 96%   BMI 47.65 kg/m   Wt Readings from Last 3 Encounters:  05/05/20 (!) 381 lb 3.2 oz (172.9 kg)  01/30/20 (!) 385 lb 9.6 oz (174.9 kg)  12/12/19 (!) 390 lb 2 oz (177 kg)    Physical Exam Vitals reviewed.  Constitutional:       General: He is not in acute distress.    Appearance: Normal appearance. He is morbidly obese. He is not ill-appearing, toxic-appearing or diaphoretic.  HENT:     Head: Normocephalic and atraumatic.  Eyes:     General: No scleral icterus.       Right eye: No discharge.        Left eye: No discharge.     Conjunctiva/sclera: Conjunctivae normal.  Cardiovascular:     Rate and Rhythm: Normal rate and regular rhythm.     Heart sounds: Normal heart sounds. No murmur heard.  No friction rub. No gallop.   Pulmonary:     Effort: Pulmonary effort is normal. No respiratory distress.     Breath sounds: Normal breath sounds. No stridor. No wheezing, rhonchi or rales.  Musculoskeletal:        General: Normal range of motion.     Cervical back: Normal range of motion.  Skin:    General: Skin is warm and dry.  Neurological:     Mental Status: He is alert and oriented to person, place, and time. Mental status is at baseline.  Psychiatric:        Mood and Affect: Mood normal.        Behavior: Behavior normal.        Thought Content: Thought content normal.        Judgment: Judgment normal.     Lab Results  Component Value Date   TSH 0.392 (L) 10/31/2019   Lab Results  Component Value Date   WBC 7.2 01/30/2020   HGB 13.7 01/30/2020   HCT 42.6 01/30/2020   MCV 88 01/30/2020   PLT 245 01/30/2020   Lab Results  Component Value Date   NA 142 01/30/2020   K 4.6 01/30/2020   CO2 30 (H) 01/30/2020   GLUCOSE 150 (H) 01/30/2020   BUN 17 01/30/2020  CREATININE 0.99 01/30/2020   BILITOT 0.7 01/30/2020   ALKPHOS 107 01/30/2020   AST 22 01/30/2020   ALT 21 01/30/2020   PROT 7.7 01/30/2020   ALBUMIN 4.4 01/30/2020   CALCIUM 9.7 01/30/2020   ANIONGAP 9 08/05/2019   Lab Results  Component Value Date   CHOL 87 (L) 01/30/2020   Lab Results  Component Value Date   HDL 35 (L) 01/30/2020   Lab Results  Component Value Date   LDLCALC 27 01/30/2020   Lab Results  Component Value Date    TRIG 145 01/30/2020   Lab Results  Component Value Date   CHOLHDL 2.5 01/30/2020   Lab Results  Component Value Date   HGBA1C 6.8 01/30/2020

## 2020-05-06 LAB — CMP14+EGFR
ALT: 11 IU/L (ref 0–44)
AST: 17 IU/L (ref 0–40)
Albumin/Globulin Ratio: 1.3 (ref 1.2–2.2)
Albumin: 4.4 g/dL (ref 3.8–4.8)
Alkaline Phosphatase: 103 IU/L (ref 48–121)
BUN/Creatinine Ratio: 17 (ref 10–24)
BUN: 18 mg/dL (ref 8–27)
Bilirubin Total: 0.9 mg/dL (ref 0.0–1.2)
CO2: 27 mmol/L (ref 20–29)
Calcium: 9.2 mg/dL (ref 8.6–10.2)
Chloride: 100 mmol/L (ref 96–106)
Creatinine, Ser: 1.07 mg/dL (ref 0.76–1.27)
GFR calc Af Amer: 84 mL/min/{1.73_m2} (ref >59)
GFR calc non Af Amer: 72 mL/min/{1.73_m2} (ref >59)
Globulin, Total: 3.3 g/dL (ref 1.5–4.5)
Glucose: 267 mg/dL — ABNORMAL HIGH (ref 65–99)
Potassium: 3.9 mmol/L (ref 3.5–5.2)
Sodium: 142 mmol/L (ref 134–144)
Total Protein: 7.7 g/dL (ref 6.0–8.5)

## 2020-05-06 LAB — PSA, TOTAL AND FREE
PSA, Free Pct: 33.3 %
PSA, Free: 0.1 ng/mL
Prostate Specific Ag, Serum: 0.3 ng/mL (ref 0.0–4.0)

## 2020-05-06 LAB — MICROALBUMIN / CREATININE URINE RATIO
Creatinine, Urine: 134.1 mg/dL
Microalb/Creat Ratio: 4 mg/g creat (ref 0–29)
Microalbumin, Urine: 5.9 ug/mL

## 2020-06-24 NOTE — Progress Notes (Signed)
Cardiology Office Note   Date:  06/25/2020   ID:  Maclin Guerrette, DOB [birth year], MRN [id number]  PCP:  Loman Brooklyn, FNP  Cardiologist:   Minus Breeding, MD   Chief Complaint  Patient presents with  . Fatigue     History of Present Illness: Richard Crawford is a 64 y.o. male who presents for follow up of atrial fib.  He has had this for years and was treated in CT.   He has been treated with Betapace.  It does not sound like he is had other management and he was never offered ablation.  However, he has had rate control and anticoagulation. At my first visit with him he was very fatigued so I switched from beta blocker to Cardizem.    Since I last saw him he has had no new cardiovascular complaints.  His biggest issue continues to be fatigue which he blames on to lower dose of the Synthroid although its been adjusted based on his TSH.  He denies any new symptoms.  He does not really notice his fibrillation.  Said no presyncope or syncope.  He gets short of breath bending over.  He has some chronic lower extremity swelling but this has not been significantly increased compared to previous.  He has had no cough fevers or chills.  Denies any PND or orthopnea.  Past Medical History:  Diagnosis Date  . Anxiety   . Arthritis    left shoulder  . Atrial fibrillation ([place])   . Cancer ([place]) 1989   thyroid  . Cellulitis   . Chronic heart failure with preserved ejection fraction ([place])   . Diabetes mellitus without complication ([place])   . Hemorrhoids    external  . Hx of colonic polyps   . Hyperlipidemia   . Hypertension   . Migraine   . Papillary thyroid carcinoma ([place])   . Postoperative hypothyroidism   . Primary osteoarthritis of left shoulder   . Sleep apnea   . Venous stasis dermatitis of both lower extremities     Past Surgical History:  Procedure Laterality Date  . ANKLE FUSION    . KNEE ARTHROSCOPY    . SHOULDER ARTHROSCOPY    . TOTAL SHOULDER REPLACEMENT Right       Current Outpatient Medications  Medication Sig Dispense Refill  . atorvastatin (LIPITOR) 20 MG tablet Take 1 tablet (20 mg total) by mouth daily. 90 tablet 1  . celecoxib (CELEBREX) 200 MG capsule Take 200 mg by mouth 2 (two) times daily.    . furosemide (LASIX) 80 MG tablet Take 1 tablet (80 mg total) by mouth daily. 90 tablet 1  . gabapentin (NEURONTIN) 300 MG capsule Take 1 capsule (300 mg total) by mouth daily. (Patient taking differently: Take 300 mg by mouth 3 (three) times daily. ) 90 capsule 1  . levothyroxine (SYNTHROID) 200 MCG tablet TAKE 1 TABLET (200 MCG TOTAL) BY MOUTH DAILY BEFORE BREAKFAST (TAKE IN ADDITION TO 50MCG TABLET) 30 tablet 0  . levothyroxine (SYNTHROID) 50 MCG tablet TAKE 1 TABLET DAILY BEFORE BREAKFAST. (TAKE IN ADDITION TO 200MCG TABLET FOR TOTAL DOSE 250 MCG) 30 tablet 0  . metFORMIN (GLUCOPHAGE-XR) 750 MG 24 hr tablet TAKE 1 TABLET BY MOUTH EVERY DAY WITH BREAKFAST 90 tablet 1  . metoprolol tartrate (LOPRESSOR) 100 MG tablet Take 2 tablets (200 mg total) by mouth daily. 180 tablet 1  . Multiple Vitamin (MULTIVITAMIN) tablet Take 1 tablet by mouth 2 (two) times daily.    Marland Kitchen  rivaroxaban (XARELTO) 20 MG TABS tablet TAKE 1 TABLET (20 MG TOTAL) BY MOUTH DAILY WITH SUPPER FOR 30 DAYS. 90 tablet 1  . valsartan (DIOVAN) 80 MG tablet Take 1 tablet (80 mg total) by mouth daily. 90 tablet 3  . glucose blood test strip Check BS daily and as needed Dx E11.9 100 each 3  . Prodigy Lancets 28G MISC Check BS daily and as needed Dx E11.9 100 each 3   No current facility-administered medications for this visit.    Allergies:   Morphine and related    ROS:  Please see the history of present illness.   Otherwise, review of systems are positive for none.   All other systems are reviewed and negative.    PHYSICAL EXAM: VS:  BP 100/60   Pulse 74   Ht 6\' 3"  (1.905 m)   Wt (!) 387 lb (175.5 kg)   BMI 48.37 kg/m  , BMI Body mass index is 48.37 kg/m. GENERAL:  Well  appearing NECK:  No jugular venous distention, waveform within normal limits, carotid upstroke brisk and symmetric, no bruits, no thyromegaly LUNGS:  Clear to auscultation bilaterally CHEST:  Unremarkable HEART:  PMI not displaced or sustained,S1 and S2 within normal limits, no S3,  no clicks, no rubs, no murmurs, irregular  ABD:  Flat, positive bowel sounds normal in frequency in pitch, no bruits, no rebound, no guarding, no midline pulsatile mass, no hepatomegaly, no splenomegaly EXT:  2 plus pulses throughout, no edema, no cyanosis no clubbing   EKG:  EKG is  ordered today. The ekg ordered today demonstrates atrial fibrillation, rate 74, axis within normal limits, QTC prolonged, low voltage.   Recent Labs: 10/31/2019: TSH 0.392 01/30/2020: Hemoglobin 13.7; Platelets 245 05/05/2020: ALT 11; BUN 18; Creatinine, Ser 1.07; Potassium 3.9; Sodium 142    Lipid Panel    Component Value Date/Time   CHOL 87 (L) 01/30/2020 1436   TRIG 145 01/30/2020 1436   HDL 35 (L) 01/30/2020 1436   CHOLHDL 2.5 01/30/2020 1436   LDLCALC 27 01/30/2020 1436      Wt Readings from Last 3 Encounters:  06/25/20 (!) 387 lb (175.5 kg)  05/05/20 (!) 381 lb 3.2 oz (172.9 kg)  01/30/20 (!) 385 lb 9.6 oz (174.9 kg)      Other studies Reviewed: Additional studies/ records that were reviewed today include: labs. Review of the above records demonstrates:  Please see elsewhere in the note.     ASSESSMENT AND PLAN:  ATRIAL FIB:   He is in chronic atrial fibrillation.    He tolerates this with anticoagulation and rate control.  No change in therapy.  HTN:  The blood pressure is actually running a little low.  I discussed with him the possibility of getting a blood pressure cuff and I would perhaps go down on his medicines if it continues to run with systolics close to 846.   DYSLIPIDEMIA:    LDL was 27.  No change in therapy.    SLEEP APNEA:    He reports that this is well managed.    CHRONIC DIASTOLIC HF:       He seems to be euvolemic at this point.  No change in therapy.  FATIGUE:     This has been a chronic problem.  I do not see any significant cardiac etiology other than possibly the low blood pressure as above.  No change in therapy pending future blood pressure readings.   COVID VACCINE:    We had  a long discussion.  Hopefully you will consider getting vaccinated.  Current medicines are reviewed at length with the patient today.  The patient does not have concerns regarding medicines.  The following changes have been made:  None  Labs/ tests ordered today include: None  Orders Placed This Encounter  Procedures  . EKG 12-Lead     Disposition:   FU with me in one year.    Signed, Minus Breeding, MD  06/25/2020 3:24 PM    [facility]

## 2020-06-25 ENCOUNTER — Encounter: Payer: Self-pay | Admitting: Cardiology

## 2020-06-25 ENCOUNTER — Other Ambulatory Visit: Payer: Self-pay

## 2020-06-25 ENCOUNTER — Ambulatory Visit: Payer: Medicare Other | Admitting: Cardiology

## 2020-06-25 VITALS — BP 100/60 | HR 74 | Ht 75.0 in | Wt 387.0 lb

## 2020-06-25 DIAGNOSIS — Z7189 Other specified counseling: Secondary | ICD-10-CM

## 2020-06-25 DIAGNOSIS — I10 Essential (primary) hypertension: Secondary | ICD-10-CM | POA: Diagnosis not present

## 2020-06-25 DIAGNOSIS — R5383 Other fatigue: Secondary | ICD-10-CM | POA: Diagnosis not present

## 2020-06-25 DIAGNOSIS — I4821 Permanent atrial fibrillation: Secondary | ICD-10-CM

## 2020-06-25 DIAGNOSIS — I5032 Chronic diastolic (congestive) heart failure: Secondary | ICD-10-CM

## 2020-06-25 NOTE — Patient Instructions (Signed)
Medication Instructions:  The current medical regimen is effective;  continue present plan and medications.  *If you need a refill on your cardiac medications before your next appointment, please call your pharmacy*  Follow-Up: At [facility], you and your health needs are our priority.  As part of our continuing mission to provide you with exceptional heart care, we have created designated Provider Care Teams.  These Care Teams include your primary Cardiologist (physician) and Advanced Practice Providers (APPs -  Physician Assistants and Nurse Practitioners) who all work together to provide you with the care you need, when you need it.  We recommend signing up for the patient portal called "MyChart".  Sign up information is provided on this After Visit Summary.  MyChart is used to connect with patients for Virtual Visits (Telemedicine).  Patients are able to view lab/test results, encounter notes, upcoming appointments, etc.  Non-urgent messages can be sent to your provider as well.   To learn more about what you can do with MyChart, go to [email or web address].    Your next appointment:   12 month(s)  The format for your next appointment:   In Person  Provider:   James Hochrein, MD   Thank you for choosing [facility]!!     

## 2020-08-05 ENCOUNTER — Ambulatory Visit: Payer: Medicare Other | Admitting: Family Medicine

## 2020-08-26 ENCOUNTER — Encounter: Payer: Self-pay | Admitting: Nurse Practitioner

## 2020-08-26 ENCOUNTER — Other Ambulatory Visit: Payer: Self-pay

## 2020-08-26 ENCOUNTER — Ambulatory Visit: Payer: Medicare Other | Admitting: Nurse Practitioner

## 2020-08-26 VITALS — BP 121/79 | HR 75 | Temp 98.1°F | Resp 20 | Ht 75.0 in | Wt 384.0 lb

## 2020-08-26 DIAGNOSIS — E119 Type 2 diabetes mellitus without complications: Secondary | ICD-10-CM

## 2020-08-26 DIAGNOSIS — E890 Postprocedural hypothyroidism: Secondary | ICD-10-CM

## 2020-08-26 DIAGNOSIS — E785 Hyperlipidemia, unspecified: Secondary | ICD-10-CM

## 2020-08-26 DIAGNOSIS — I152 Hypertension secondary to endocrine disorders: Secondary | ICD-10-CM

## 2020-08-26 DIAGNOSIS — E1169 Type 2 diabetes mellitus with other specified complication: Secondary | ICD-10-CM | POA: Diagnosis not present

## 2020-08-26 DIAGNOSIS — E1159 Type 2 diabetes mellitus with other circulatory complications: Secondary | ICD-10-CM | POA: Diagnosis not present

## 2020-08-26 DIAGNOSIS — I4821 Permanent atrial fibrillation: Secondary | ICD-10-CM | POA: Diagnosis not present

## 2020-08-26 DIAGNOSIS — L989 Disorder of the skin and subcutaneous tissue, unspecified: Secondary | ICD-10-CM

## 2020-08-26 LAB — BAYER DCA HB A1C WAIVED: HB A1C (BAYER DCA - WAIVED): 6.9 % (ref <7.0)

## 2020-08-26 NOTE — Assessment & Plan Note (Addendum)
Type 2 diabetes well controlled on current medication.  A1c completed 6.9.  Patient goal is to remain below 7.  Provided education to patient with printed handouts given.  Continue healthy diet and exercise regimen as tolerated.  Labs completed CBC, CMP, lipid panel,.  Follow-up in 3 months.

## 2020-08-26 NOTE — Assessment & Plan Note (Signed)
Hyperlipidemia well-controlled on current medication.  No changes to current dose completed lipid panel.  Current medication Lipitor 20 mg tablet.  Provided education to patient with printed handouts given. Continue healthy diet with exercise regimen as tolerated. Follow-up with any signs or symptoms of hyperlipidemia.

## 2020-08-26 NOTE — Patient Instructions (Signed)
Diabetes Basics  Diabetes (diabetes mellitus) is a long-term (chronic) disease. It occurs when the body does not properly use sugar (glucose) that is released from food after you eat. Diabetes may be caused by one or both of these problems:  Your pancreas does not make enough of a hormone called insulin.  Your body does not react in a normal way to insulin that it makes. Insulin lets sugars (glucose) go into cells in your body. This gives you energy. If you have diabetes, sugars cannot get into cells. This causes high blood sugar (hyperglycemia). Follow these instructions at home: How is diabetes treated? You may need to take insulin or other diabetes medicines daily to keep your blood sugar in balance. Take your diabetes medicines every day as told by your doctor. List your diabetes medicines here: Diabetes medicines  Name of medicine: ______________________________ ? Amount (dose): _______________ Time (a.m./p.m.): _______________ Notes: ___________________________________  Name of medicine: ______________________________ ? Amount (dose): _______________ Time (a.m./p.m.): _______________ Notes: ___________________________________  Name of medicine: ______________________________ ? Amount (dose): _______________ Time (a.m./p.m.): _______________ Notes: ___________________________________ If you use insulin, you will learn how to give yourself insulin by injection. You may need to adjust the amount based on the food that you eat. List the types of insulin you use here: Insulin  Insulin type: ______________________________ ? Amount (dose): _______________ Time (a.m./p.m.): _______________ Notes: ___________________________________  Insulin type: ______________________________ ? Amount (dose): _______________ Time (a.m./p.m.): _______________ Notes: ___________________________________  Insulin type: ______________________________ ? Amount (dose): _______________ Time (a.m./p.m.):  _______________ Notes: ___________________________________  Insulin type: ______________________________ ? Amount (dose): _______________ Time (a.m./p.m.): _______________ Notes: ___________________________________  Insulin type: ______________________________ ? Amount (dose): _______________ Time (a.m./p.m.): _______________ Notes: ___________________________________ How do I manage my blood sugar?  Check your blood sugar levels using a blood glucose monitor as directed by your doctor. Your doctor will set treatment goals for you. Generally, you should have these blood sugar levels:  Before meals (preprandial): 80-130 mg/dL (4.4-7.2 mmol/L).  After meals (postprandial): below 180 mg/dL (10 mmol/L).  A1c level: less than 7%. Write down the times that you will check your blood sugar levels: Blood sugar checks  Time: _______________ Notes: ___________________________________  Time: _______________ Notes: ___________________________________  Time: _______________ Notes: ___________________________________  Time: _______________ Notes: ___________________________________  Time: _______________ Notes: ___________________________________  Time: _______________ Notes: ___________________________________  What do I need to know about low blood sugar? Low blood sugar is called hypoglycemia. This is when blood sugar is at or below 70 mg/dL (3.9 mmol/L). Symptoms may include:  Feeling: ? Hungry. ? Worried or nervous (anxious). ? Sweaty and clammy. ? Confused. ? Dizzy. ? Sleepy. ? Sick to your stomach (nauseous).  Having: ? A fast heartbeat. ? A headache. ? A change in your vision. ? Tingling or no feeling (numbness) around the mouth, lips, or tongue. ? Jerky movements that you cannot control (seizure).  Having trouble with: ? Moving (coordination). ? Sleeping. ? Passing out (fainting). ? Getting upset easily (irritability). Treating low blood sugar To treat low blood  sugar, eat or drink something sugary right away. If you can think clearly and swallow safely, follow the 15:15 rule:  Take 15 grams of a fast-acting carb (carbohydrate). Talk with your doctor about how much you should take.  Some fast-acting carbs are: ? Sugar tablets (glucose pills). Take 3-4 glucose pills. ? 6-8 pieces of hard candy. ? 4-6 oz (120-150 mL) of fruit juice. ? 4-6 oz (120-150 mL) of regular (not diet) soda. ? 1 Tbsp (15 mL) honey or sugar.    Check your blood sugar 15 minutes after you take the carb.  If your blood sugar is still at or below 70 mg/dL (3.9 mmol/L), take 15 grams of a carb again.  If your blood sugar does not go above 70 mg/dL (3.9 mmol/L) after 3 tries, get help right away.  After your blood sugar goes back to normal, eat a meal or a snack within 1 hour. Treating very low blood sugar If your blood sugar is at or below 54 mg/dL (3 mmol/L), you have very low blood sugar (severe hypoglycemia). This is an emergency. Do not wait to see if the symptoms will go away. Get medical help right away. Call your local emergency services (911 in the [place] Do not drive yourself to the hospital. Questions to ask your health care provider  Do I need to meet with a diabetes educator?  What equipment will I need to care for myself at home?  What diabetes medicines do I need? When should I take them?  How often do I need to check my blood sugar?  What number can I call if I have questions?  When is my next doctor's visit?  Where can I find a support group for people with diabetes? Where to find more information  American Diabetes Association: www.diabetes.org  American Association of Diabetes Educators: www.diabeteseducator.org/patient-resources Contact a doctor if:  Your blood sugar is at or above 240 mg/dL (13.3 mmol/L) for 2 days in a row.  You have been sick or have had a fever for 2 days or more, and you are not getting better.  You have any of these  problems for more than 6 hours: ? You cannot eat or drink. ? You feel sick to your stomach (nauseous). ? You throw up (vomit). ? You have watery poop (diarrhea). Get help right away if:  Your blood sugar is lower than 54 mg/dL (3 mmol/L).  You get confused.  You have trouble: ? Thinking clearly. ? Breathing. Summary  Diabetes (diabetes mellitus) is a long-term (chronic) disease. It occurs when the body does not properly use sugar (glucose) that is released from food after digestion.  Take insulin and diabetes medicines as told.  Check your blood sugar every day, as often as told.  Keep all follow-up visits as told by your doctor. This is important. This information is not intended to replace advice given to you by your health care provider. Make sure you discuss any questions you have with your health care provider. Document Revised: 06/20/2019 Document Reviewed: 12/30/2017 Elsevier Patient Education  [place]. Hypothyroidism  Hypothyroidism is when the thyroid gland does not make enough of certain hormones (it is underactive). The thyroid gland is a small gland located in the lower front part of the neck, just in front of the windpipe (trachea). This gland makes hormones that help control how the body uses food for energy (metabolism) as well as how the heart and brain function. These hormones also play a role in keeping your bones strong. When the thyroid is underactive, it produces too little of the hormones thyroxine (T4) and triiodothyronine (T3). What are the causes? This condition may be caused by:  Hashimoto's disease. This is a disease in which the body's disease-fighting system (immune system) attacks the thyroid gland. This is the most common cause.  Viral infections.  Pregnancy.  Certain medicines.  Birth defects.  Past radiation treatments to the head or neck for cancer.  Past treatment with radioactive iodine.  Past exposure  to radiation in the  environment.  Past surgical removal of part or all of the thyroid.  Problems with a gland in the center of the brain (pituitary gland).  Lack of enough iodine in the diet. What increases the risk? You are more likely to develop this condition if:  You are male.  You have a family history of thyroid conditions.  You use a medicine called lithium.  You take medicines that affect the immune system (immunosuppressants). What are the signs or symptoms? Symptoms of this condition include:  Feeling as though you have no energy (lethargy).  Not being able to tolerate cold.  Weight gain that is not explained by a change in diet or exercise habits.  Lack of appetite.  Dry skin.  Coarse hair.  Menstrual irregularity.  Slowing of thought processes.  Constipation.  Sadness or depression. How is this diagnosed? This condition may be diagnosed based on:  Your symptoms, your medical history, and a physical exam.  Blood tests. You may also have imaging tests, such as an ultrasound or MRI. How is this treated? This condition is treated with medicine that replaces the thyroid hormones that your body does not make. After you begin treatment, it may take several weeks for symptoms to go away. Follow these instructions at home:  Take over-the-counter and prescription medicines only as told by your health care provider.  If you start taking any new medicines, tell your health care provider.  Keep all follow-up visits as told by your health care provider. This is important. ? As your condition improves, your dosage of thyroid hormone medicine may change. ? You will need to have blood tests regularly so that your health care provider can monitor your condition. Contact a health care provider if:  Your symptoms do not get better with treatment.  You are taking thyroid replacement medicine and you: ? Sweat a lot. ? Have tremors. ? Feel anxious. ? Lose weight rapidly. ? Cannot  tolerate heat. ? Have emotional swings. ? Have diarrhea. ? Feel weak. Get help right away if you have:  Chest pain.  An irregular heartbeat.  A rapid heartbeat.  Difficulty breathing. Summary  Hypothyroidism is when the thyroid gland does not make enough of certain hormones (it is underactive).  When the thyroid is underactive, it produces too little of the hormones thyroxine (T4) and triiodothyronine (T3).  The most common cause is Hashimoto's disease, a disease in which the body's disease-fighting system (immune system) attacks the thyroid gland. The condition can also be caused by viral infections, medicine, pregnancy, or past radiation treatment to the head or neck.  Symptoms may include weight gain, dry skin, constipation, feeling as though you do not have energy, and not being able to tolerate cold.  This condition is treated with medicine to replace the thyroid hormones that your body does not make. This information is not intended to replace advice given to you by your health care provider. Make sure you discuss any questions you have with your health care provider. Document Revised: 09/09/2017 Document Reviewed: 09/07/2017 Elsevier Patient Education  [place]. High Cholesterol  High cholesterol is a condition in which the blood has high levels of a white, waxy, fat-like substance (cholesterol). The human body needs small amounts of cholesterol. The liver makes all the cholesterol that the body needs. Extra (excess) cholesterol comes from the food that we eat. Cholesterol is carried from the liver by the blood through the blood vessels. If you have high  cholesterol, deposits (plaques) may build up on the walls of your blood vessels (arteries). Plaques make the arteries narrower and stiffer. Cholesterol plaques increase your risk for heart attack and stroke. Work with your health care provider to keep your cholesterol levels in a healthy range. What increases the  risk? This condition is more likely to develop in people who:  Eat foods that are high in animal fat (saturated fat) or cholesterol.  Are overweight.  Are not getting enough exercise.  Have a family history of high cholesterol. What are the signs or symptoms? There are no symptoms of this condition. How is this diagnosed? This condition may be diagnosed from the results of a blood test.  If you are older than age 62, your health care provider may check your cholesterol every 4-6 years.  You may be checked more often if you already have high cholesterol or other risk factors for heart disease. The blood test for cholesterol measures:  "Bad" cholesterol (LDL cholesterol). This is the main type of cholesterol that causes heart disease. The desired level for LDL is less than 100.  "Good" cholesterol (HDL cholesterol). This type helps to protect against heart disease by cleaning the arteries and carrying the LDL away. The desired level for HDL is 60 or higher.  Triglycerides. These are fats that the body can store or burn for energy. The desired number for triglycerides is lower than 150.  Total cholesterol. This is a measure of the total amount of cholesterol in your blood, including LDL cholesterol, HDL cholesterol, and triglycerides. A healthy number is less than 200. How is this treated? This condition is treated with diet changes, lifestyle changes, and medicines. Diet changes  This may include eating more whole grains, fruits, vegetables, nuts, and fish.  This may also include cutting back on red meat and foods that have a lot of added sugar. Lifestyle changes  Changes may include getting at least 40 minutes of aerobic exercise 3 times a week. Aerobic exercises include walking, biking, and swimming. Aerobic exercise along with a healthy diet can help you maintain a healthy weight.  Changes may also include quitting smoking. Medicines  Medicines are usually given if diet and  lifestyle changes have failed to reduce your cholesterol to healthy levels.  Your health care provider may prescribe a statin medicine. Statin medicines have been shown to reduce cholesterol, which can reduce the risk of heart disease. Follow these instructions at home: Eating and drinking If told by your health care provider:  Eat chicken (without skin), fish, veal, shellfish, ground [place] breast, and round or loin cuts of red meat.  Do not eat fried foods or fatty meats, such as hot dogs and salami.  Eat plenty of fruits, such as apples.  Eat plenty of vegetables, such as broccoli, potatoes, and carrots.  Eat beans, peas, and lentils.  Eat grains such as barley, rice, couscous, and bulgur wheat.  Eat pasta without cream sauces.  Use skim or nonfat milk, and eat low-fat or nonfat yogurt and cheeses.  Do not eat or drink whole milk, cream, ice cream, egg yolks, or hard cheeses.  Do not eat stick margarine or tub margarines that contain trans fats (also called partially hydrogenated oils).  Do not eat saturated tropical oils, such as coconut oil and palm oil.  Do not eat cakes, cookies, crackers, or other baked goods that contain trans fats.  General instructions  Exercise as directed by your health care provider. Increase your activity level  with activities such as gardening, walking, and taking the stairs.  Take over-the-counter and prescription medicines only as told by your health care provider.  Do not use any products that contain nicotine or tobacco, such as cigarettes and e-cigarettes. If you need help quitting, ask your health care provider.  Keep all follow-up visits as told by your health care provider. This is important. Contact a health care provider if:  You are struggling to maintain a healthy diet or weight.  You need help to start on an exercise program.  You need help to stop smoking. Get help right away if:  You have chest pain.  You have trouble  breathing. This information is not intended to replace advice given to you by your health care provider. Make sure you discuss any questions you have with your health care provider. Document Revised: 09/30/2017 Document Reviewed: 03/27/2016 Elsevier Patient Education  [place]. Hypertension, Adult Hypertension is another name for high blood pressure. High blood pressure forces your heart to work harder to pump blood. This can cause problems over time. There are two numbers in a blood pressure reading. There is a top number (systolic) over a bottom number (diastolic). It is best to have a blood pressure that is below 120/80. Healthy choices can help lower your blood pressure, or you may need medicine to help lower it. What are the causes? The cause of this condition is not known. Some conditions may be related to high blood pressure. What increases the risk?  Smoking.  Having type 2 diabetes mellitus, high cholesterol, or both.  Not getting enough exercise or physical activity.  Being overweight.  Having too much fat, sugar, calories, or salt (sodium) in your diet.  Drinking too much alcohol.  Having long-term (chronic) kidney disease.  Having a family history of high blood pressure.  Age. Risk increases with age.  Race. You may be at higher risk if you are African American.  Gender. Men are at higher risk than women before age 107. After age 57, women are at higher risk than men.  Having obstructive sleep apnea.  Stress. What are the signs or symptoms?  High blood pressure may not cause symptoms. Very high blood pressure (hypertensive crisis) may cause: ? Headache. ? Feelings of worry or nervousness (anxiety). ? Shortness of breath. ? Nosebleed. ? A feeling of being sick to your stomach (nausea). ? Throwing up (vomiting). ? Changes in how you see. ? Very bad chest pain. ? Seizures. How is this treated?  This condition is treated by making healthy lifestyle  changes, such as: ? Eating healthy foods. ? Exercising more. ? Drinking less alcohol.  Your health care provider may prescribe medicine if lifestyle changes are not enough to get your blood pressure under control, and if: ? Your top number is above 130. ? Your bottom number is above 80.  Your personal target blood pressure may vary. Follow these instructions at home: Eating and drinking   If told, follow the DASH eating plan. To follow this plan: ? Fill one half of your plate at each meal with fruits and vegetables. ? Fill one fourth of your plate at each meal with whole grains. Whole grains include whole-wheat pasta, brown rice, and whole-grain bread. ? Eat or drink low-fat dairy products, such as skim milk or low-fat yogurt. ? Fill one fourth of your plate at each meal with low-fat (lean) proteins. Low-fat proteins include fish, chicken without skin, eggs, beans, and tofu. ? Avoid fatty  meat, cured and processed meat, or chicken with skin. ? Avoid pre-made or processed food.  Eat less than 1,500 mg of salt each day.  Do not drink alcohol if: ? Your doctor tells you not to drink. ? You are pregnant, may be pregnant, or are planning to become pregnant.  If you drink alcohol: ? Limit how much you use to:  0-1 drink a day for women.  0-2 drinks a day for men. ? Be aware of how much alcohol is in your drink. In the [place] one drink equals one 12 oz bottle of beer (355 mL), one 5 oz glass of wine (148 mL), or one 1 oz glass of hard liquor (44 mL). Lifestyle   Work with your doctor to stay at a healthy weight or to lose weight. Ask your doctor what the best weight is for you.  Get at least 30 minutes of exercise most days of the week. This may include walking, swimming, or biking.  Get at least 30 minutes of exercise that strengthens your muscles (resistance exercise) at least 3 days a week. This may include lifting weights or doing Pilates.  Do not use any products that  contain nicotine or tobacco, such as cigarettes, e-cigarettes, and chewing tobacco. If you need help quitting, ask your doctor.  Check your blood pressure at home as told by your doctor.  Keep all follow-up visits as told by your doctor. This is important. Medicines  Take over-the-counter and prescription medicines only as told by your doctor. Follow directions carefully.  Do not skip doses of blood pressure medicine. The medicine does not work as well if you skip doses. Skipping doses also puts you at risk for problems.  Ask your doctor about side effects or reactions to medicines that you should watch for. Contact a doctor if you:  Think you are having a reaction to the medicine you are taking.  Have headaches that keep coming back (recurring).  Feel dizzy.  Have swelling in your ankles.  Have trouble with your vision. Get help right away if you:  Get a very bad headache.  Start to feel mixed up (confused).  Feel weak or numb.  Feel faint.  Have very bad pain in your: ? Chest. ? Belly (abdomen).  Throw up more than once.  Have trouble breathing. Summary  Hypertension is another name for high blood pressure.  High blood pressure forces your heart to work harder to pump blood.  For most people, a normal blood pressure is less than 120/80.  Making healthy choices can help lower blood pressure. If your blood pressure does not get lower with healthy choices, you may need to take medicine. This information is not intended to replace advice given to you by your health care provider. Make sure you discuss any questions you have with your health care provider. Document Revised: 06/07/2018 Document Reviewed: 06/07/2018 Elsevier Patient Education  2020 [employer].

## 2020-08-26 NOTE — Progress Notes (Signed)
Established Patient Office Visit  Subjective:  Patient ID: Richard Crawford, male    DOB: [birth year]  Age: 64 y.o. MRN: [id number]  CC:  Chief Complaint  Patient presents with   Medical Management of Chronic Issues    3 mo    Hyperlipidemia   Diabetes   Hypertension   Hypothyroidism    HPI Richard Crawford presents for follow-up mixed hyperlipidemia   Patient was diagnosed in 09/26/2017 compliance with treatment has been good ; the patient is compliant with medications, maintains a low cholesterol diet , follows up as directed , and maintains an exercise regimen as tolerated. The patient denies experiencing any hypercholesterolemia related symptoms.  Current medication Lipitor 20 mg tablet daily.  The patient presents with history of type 2 diabetes mellitus without complications. Patient was diagnosed in 2018. Compliance with treatment has been good; the patient takes medication as directed , maintains a diabetic diet and an exercise regimen , follows up as directed , and is keeping a glucose diary. Sugars runs 120-150. Patient specifically denies associated symptoms, including blurred vision, fatigue, polydipsia, polyphagia and polyuria . Patient denies hypoglycemia. In regard to preventative care, the patient performs foot self-exams daily and last ophthalmology exam was in; patient will schedule.  Current medication Metformin 750 mg 1 tablet by mouth daily  Pt presents for follow up of hypertension. Patient was diagnosed in _12/17/2018 The patient is tolerating the medication well without side effects. Compliance with treatment has been good; including taking medication as directed , maintains a healthy diet and regular exercise regimen , and following up as directed.  Current medication Lopressor 100 mg tablet 2 tablets by mouth daily.  Thyroid: Patient presents for follow-up evaluation of hypothyroidism. Current symptoms include, fatigue, weight changes, heat/cold intolerance,  bowel/skin changes or CVS symptoms. Patient denies anxiousness, feeling excessive energy, palpitations, diarrhea.    Past Medical History:  Diagnosis Date   Anxiety    Arthritis    left shoulder   Atrial fibrillation ([place])    Cancer ([place]) 1989   thyroid   Cellulitis    Chronic heart failure with preserved ejection fraction (HCC)    Diabetes mellitus without complication (HCC)    Hemorrhoids    external   Hx of colonic polyps    Hyperlipidemia    Hypertension    Migraine    Papillary thyroid carcinoma (HCC)    Postoperative hypothyroidism    Primary osteoarthritis of left shoulder    Sleep apnea    Venous stasis dermatitis of both lower extremities     Past Surgical History:  Procedure Laterality Date   ANKLE FUSION     KNEE ARTHROSCOPY     SHOULDER ARTHROSCOPY     TOTAL SHOULDER REPLACEMENT Right     Family History  Problem Relation Age of Onset   Hypertension Mother    Dementia Mother    Breast cancer Mother    Stomach cancer Mother    Ovarian cancer Mother    Heart disease Father     Social History   Socioeconomic History   Marital status: Married    Spouse name: Not on file   Number of children: Not on file   Years of education: Not on file   Highest education level: Not on file  Occupational History   Not on file  Tobacco Use   Smoking status: Never Smoker   Smokeless tobacco: Never Used  Substance and Sexual Activity   Alcohol use: Not Currently   Drug use:  Never   Sexual activity: Not on file  Other Topics Concern   Not on file  Social History Narrative   Not on file   Social Determinants of Health   Financial Resource Strain:    Difficulty of Paying Living Expenses: Not on file  Food Insecurity:    Worried About Running Out of Food in the Last Year: Not on file   Ran Out of Food in the Last Year: Not on file  Transportation Needs:    Lack of Transportation (Medical): Not on file   Lack of  Transportation (Non-Medical): Not on file  Physical Activity:    Days of Exercise per Week: Not on file   Minutes of Exercise per Session: Not on file  Stress:    Feeling of Stress : Not on file  Social Connections:    Frequency of Communication with Friends and Family: Not on file   Frequency of Social Gatherings with Friends and Family: Not on file   Attends Religious Services: Not on file   Active Member of Clubs or Organizations: Not on file   Attends Banker Meetings: Not on file   Marital Status: Not on file  Intimate Partner Violence:    Fear of Current or Ex-Partner: Not on file   Emotionally Abused: Not on file   Physically Abused: Not on file   Sexually Abused: Not on file    Outpatient Medications Prior to Visit  Medication Sig Dispense Refill   atorvastatin (LIPITOR) 20 MG tablet Take 1 tablet (20 mg total) by mouth daily. 90 tablet 1   celecoxib (CELEBREX) 200 MG capsule Take 200 mg by mouth 2 (two) times daily.     furosemide (LASIX) 80 MG tablet Take 1 tablet (80 mg total) by mouth daily. 90 tablet 1   gabapentin (NEURONTIN) 100 MG capsule Take 300 mg by mouth 3 (three) times daily.     glucose blood test strip Check BS daily and as needed Dx E11.9 100 each 3   levothyroxine (SYNTHROID) 200 MCG tablet TAKE 1 TABLET (200 MCG TOTAL) BY MOUTH DAILY BEFORE BREAKFAST (TAKE IN ADDITION TO TABLET) 30 tablet 0   levothyroxine (SYNTHROID) 50 MCG tablet TAKE 1 TABLET DAILY BEFORE BREAKFAST. (TAKE IN ADDITION TO TABLET FOR TOTAL DOSE 250 MCG) 30 tablet 0   metFORMIN (GLUCOPHAGE-XR) 750 MG 24 hr tablet TAKE 1 TABLET BY MOUTH EVERY DAY WITH BREAKFAST 90 tablet 1   metoprolol tartrate (LOPRESSOR) 100 MG tablet Take 2 tablets (200 mg total) by mouth daily. 180 tablet 1   Multiple Vitamin (MULTIVITAMIN) tablet Take 1 tablet by mouth 2 (two) times daily.     Prodigy Lancets 28G MISC Check BS daily and as needed Dx E11.9 100 each 3    rivaroxaban (XARELTO) 20 MG TABS tablet TAKE 1 TABLET (20 MG TOTAL) BY MOUTH DAILY WITH SUPPER FOR 30 DAYS. 90 tablet 1   valsartan (DIOVAN) 80 MG tablet Take 1 tablet (80 mg total) by mouth daily. 90 tablet 3   gabapentin (NEURONTIN) 300 MG capsule Take 1 capsule (300 mg total) by mouth daily. (Patient taking differently: Take 300 mg by mouth 3 (three) times daily. ) 90 capsule 1   No facility-administered medications prior to visit.    Allergies  Allergen Reactions   Morphine And Related Nausea And Vomiting    ROS Review of Systems  Skin: Positive for color change.       Worsening skin lesion left neck  Psychiatric/Behavioral: The  patient is not nervous/anxious.   All other systems reviewed and are negative.     Objective:    Physical Exam Constitutional:      Appearance: Normal appearance. He is obese.  HENT:     Head: Normocephalic.  Eyes:     Conjunctiva/sclera: Conjunctivae normal.  Cardiovascular:     Rate and Rhythm: Normal rate and regular rhythm.     Pulses: Normal pulses.     Heart sounds: Normal heart sounds.  Pulmonary:     Effort: Pulmonary effort is normal.     Breath sounds: Normal breath sounds.  Abdominal:     General: Bowel sounds are normal.  Musculoskeletal:        General: Tenderness present.     Right lower leg: No edema.     Left lower leg: No edema.  Skin:    General: Skin is dry.     Findings: Lesion present.  Neurological:     Mental Status: He is alert and oriented to person, place, and time.  Psychiatric:        Mood and Affect: Mood normal.        Behavior: Behavior normal.     BP 121/79    Pulse 75    Temp 98.1 F (36.7 C)    Resp 20    Ht $R'6\' 3"'aZ$  (1.905 m)    Wt (!) 384 lb (174.2 kg)    SpO2 96%    BMI 48.00 kg/m  Wt Readings from Last 3 Encounters:  08/26/20 (!) 384 lb (174.2 kg)  06/25/20 (!) 387 lb (175.5 kg)  05/05/20 (!) 381 lb 3.2 oz (172.9 kg)      Lab Results  Component Value Date   TSH 0.392 (L) 10/31/2019    Lab Results  Component Value Date   WBC 7.2 01/30/2020   HGB 13.7 01/30/2020   HCT 42.6 01/30/2020   MCV 88 01/30/2020   PLT 245 01/30/2020   Lab Results  Component Value Date   NA 142 05/05/2020   K 3.9 05/05/2020   CO2 27 05/05/2020   GLUCOSE 267 (H) 05/05/2020   BUN 18 05/05/2020   CREATININE 1.07 05/05/2020   BILITOT 0.9 05/05/2020   ALKPHOS 103 05/05/2020   AST 17 05/05/2020   ALT 11 05/05/2020   PROT 7.7 05/05/2020   ALBUMIN 4.4 05/05/2020   CALCIUM 9.2 05/05/2020   ANIONGAP 9 08/05/2019   Lab Results  Component Value Date   CHOL 87 (L) 01/30/2020   Lab Results  Component Value Date   HDL 35 (L) 01/30/2020   Lab Results  Component Value Date   LDLCALC 27 01/30/2020   Lab Results  Component Value Date   TRIG 145 01/30/2020   Lab Results  Component Value Date   CHOLHDL 2.5 01/30/2020   Lab Results  Component Value Date   HGBA1C 6.9 08/26/2020      Assessment & Plan:   Problem List Items Addressed This Visit      Cardiovascular and Mediastinum   Hypertension associated with diabetes ([place])   Relevant Orders   CBC with Differential/Platelet   CMP14+EGFR   Permanent atrial fibrillation ([place])   Relevant Orders   CBC with Differential/Platelet   CMP14+EGFR     Endocrine   Postoperative hypothyroidism    Hypothyroidism well managed on current medication.  TSH results pending provided education to patient with printed handouts given.  Follow-up in 3 months      Relevant Orders   CBC with  Differential/Platelet   Thyroid Panel With TSH   Hyperlipidemia associated with type 2 diabetes mellitus ([place])    Hyperlipidemia well-controlled on current medication.  No changes to current dose completed lipid panel.  Current medication Lipitor 20 mg tablet.  Provided education to patient with printed handouts given. Continue healthy diet with exercise regimen as tolerated. Follow-up with any signs or symptoms of hyperlipidemia.       Relevant Orders    CBC with Differential/Platelet   Lipid panel   Type 2 diabetes mellitus without complication, without long-term current use of insulin (HCC) - Primary    Type 2 diabetes well controlled on current medication.  A1c completed 6.9.  Patient goal is to remain below 7.  Provided education to patient with printed handouts given.  Continue healthy diet and exercise regimen as tolerated.  Labs completed CBC, CMP, lipid panel,.  Follow-up in 3 months.      Relevant Orders   Bayer DCA Hb A1c Waived (Completed)   CBC with Differential/Platelet     Musculoskeletal and Integument   Skin lesion    New skin lesion left side of the neck.  Referral to dermatology completed divided education to patient with printed handouts given.  Follow-up with worsening or unresolved symptoms      Relevant Orders   Ambulatory referral to Dermatology        Follow-up: Return in about 3 months (around 11/26/2020).    Ivy Lynn, NP

## 2020-08-26 NOTE — Assessment & Plan Note (Signed)
Hypothyroidism well managed on current medication.  TSH results pending provided education to patient with printed handouts given.  Follow-up in 3 months

## 2020-08-26 NOTE — Assessment & Plan Note (Signed)
New skin lesion left side of the neck.  Referral to dermatology completed divided education to patient with printed handouts given.  Follow-up with worsening or unresolved symptoms

## 2020-08-27 LAB — LIPID PANEL
Chol/HDL Ratio: 2.5 ratio (ref 0.0–5.0)
Cholesterol, Total: 91 mg/dL — ABNORMAL LOW (ref 100–199)
HDL: 37 mg/dL — ABNORMAL LOW (ref >39)
LDL Chol Calc (NIH): 37 mg/dL (ref 0–99)
Triglycerides: 86 mg/dL (ref 0–149)
VLDL Cholesterol Cal: 17 mg/dL (ref 5–40)

## 2020-08-27 LAB — CBC WITH DIFFERENTIAL/PLATELET
Basophils Absolute: 0.1 10*3/uL (ref 0.0–0.2)
Basos: 1 %
EOS (ABSOLUTE): 0.2 10*3/uL (ref 0.0–0.4)
Eos: 3 %
Hematocrit: 39.9 % (ref 37.5–51.0)
Hemoglobin: 13.3 g/dL (ref 13.0–17.7)
Immature Grans (Abs): 0 10*3/uL (ref 0.0–0.1)
Immature Granulocytes: 0 %
Lymphocytes Absolute: 2.9 10*3/uL (ref 0.7–3.1)
Lymphs: 40 %
MCH: 28.8 pg (ref 26.6–33.0)
MCHC: 33.3 g/dL (ref 31.5–35.7)
MCV: 86 fL (ref 79–97)
Monocytes Absolute: 1 10*3/uL — ABNORMAL HIGH (ref 0.1–0.9)
Monocytes: 13 %
Neutrophils Absolute: 3.2 10*3/uL (ref 1.4–7.0)
Neutrophils: 43 %
Platelets: 165 10*3/uL (ref 150–450)
RBC: 4.62 x10E6/uL (ref 4.14–5.80)
RDW: 13 % (ref 11.6–15.4)
WBC: 7.4 10*3/uL (ref 3.4–10.8)

## 2020-08-27 LAB — CMP14+EGFR
ALT: 17 IU/L (ref 0–44)
AST: 21 IU/L (ref 0–40)
Albumin/Globulin Ratio: 1.5 (ref 1.2–2.2)
Albumin: 4.6 g/dL (ref 3.8–4.8)
Alkaline Phosphatase: 111 IU/L (ref 44–121)
BUN/Creatinine Ratio: 15 (ref 10–24)
BUN: 15 mg/dL (ref 8–27)
Bilirubin Total: 0.7 mg/dL (ref 0.0–1.2)
CO2: 30 mmol/L — ABNORMAL HIGH (ref 20–29)
Calcium: 9.3 mg/dL (ref 8.6–10.2)
Chloride: 101 mmol/L (ref 96–106)
Creatinine, Ser: 1.02 mg/dL (ref 0.76–1.27)
GFR calc Af Amer: 89 mL/min/{1.73_m2} (ref >59)
GFR calc non Af Amer: 77 mL/min/{1.73_m2} (ref >59)
Globulin, Total: 3.1 g/dL (ref 1.5–4.5)
Glucose: 147 mg/dL — ABNORMAL HIGH (ref 65–99)
Potassium: 4.3 mmol/L (ref 3.5–5.2)
Sodium: 144 mmol/L (ref 134–144)
Total Protein: 7.7 g/dL (ref 6.0–8.5)

## 2020-08-27 LAB — THYROID PANEL WITH TSH
Free Thyroxine Index: 1.9 (ref 1.2–4.9)
T3 Uptake Ratio: 24 % (ref 24–39)
T4, Total: 8 ug/dL (ref 4.5–12.0)
TSH: 1.38 u[IU]/mL (ref 0.450–4.500)

## 2020-09-08 ENCOUNTER — Other Ambulatory Visit: Payer: Self-pay | Admitting: Nurse Practitioner

## 2020-09-08 DIAGNOSIS — E119 Type 2 diabetes mellitus without complications: Secondary | ICD-10-CM

## 2020-09-11 ENCOUNTER — Telehealth: Payer: Self-pay | Admitting: Family Medicine

## 2020-09-11 DIAGNOSIS — E1159 Type 2 diabetes mellitus with other circulatory complications: Secondary | ICD-10-CM

## 2020-09-11 DIAGNOSIS — I152 Hypertension secondary to endocrine disorders: Secondary | ICD-10-CM

## 2020-09-11 MED ORDER — FUROSEMIDE 80 MG PO TABS: 80.0000 mg | ORAL_TABLET | Freq: Every day | ORAL | 1 refills | Status: DC

## 2020-09-11 NOTE — Telephone Encounter (Signed)
  Prescription Request  09/11/2020  What is the name of the medication or equipment? furosemide  Have you contacted your pharmacy to request a refill? (if applicable) yes   Which pharmacy would you like this sent to? [facility]    Patient notified that their request is being sent to the clinical staff for review and that they should receive a response within 2 business days.

## 2020-09-11 NOTE — Telephone Encounter (Signed)
Pt aware refill sent to pharmacy 

## 2020-09-17 LAB — HM DIABETES EYE EXAM

## 2020-09-23 ENCOUNTER — Other Ambulatory Visit: Payer: Self-pay | Admitting: Nurse Practitioner

## 2020-09-23 DIAGNOSIS — E1169 Type 2 diabetes mellitus with other specified complication: Secondary | ICD-10-CM

## 2020-09-23 DIAGNOSIS — E785 Hyperlipidemia, unspecified: Secondary | ICD-10-CM

## 2020-09-29 ENCOUNTER — Telehealth: Payer: Medicare Other | Admitting: Family

## 2020-09-29 DIAGNOSIS — J069 Acute upper respiratory infection, unspecified: Secondary | ICD-10-CM

## 2020-09-29 MED ORDER — FLUTICASONE PROPIONATE 50 MCG/ACT NA SUSP: 2.0000 | Freq: Every day | NASAL | 6 refills | Status: DC

## 2020-09-29 MED ORDER — PREDNISONE 10 MG (21) PO TBPK: ORAL_TABLET | ORAL | 0 refills | Status: DC

## 2020-09-29 MED ORDER — BENZONATATE 100 MG PO CAPS: 100.0000 mg | ORAL_CAPSULE | Freq: Three times a day (TID) | ORAL | 0 refills | Status: DC | PRN

## 2020-09-29 NOTE — Progress Notes (Signed)
We are sorry you are not feeling well.  Here is how we plan to help!  Based on what you have shared with me, it looks like you may have a viral upper respiratory infection.  Upper respiratory infections are caused by a large number of viruses; however, rhinovirus is the most common cause.   Symptoms vary from person to person, with common symptoms including sore throat, cough, fatigue or lack of energy and feeling of general discomfort.  A low-grade fever of up to 100.4 may present, but is often uncommon.  Symptoms vary however, and are closely related to a person's age or underlying illnesses.  The most common symptoms associated with an upper respiratory infection are nasal discharge or congestion, cough, sneezing, headache and pressure in the ears and face.  These symptoms usually persist for about 3 to 10 days, but can last up to 2 weeks.  It is important to know that upper respiratory infections do not cause serious illness or complications in most cases.    Upper respiratory infections can be transmitted from person to person, with the most common method of transmission being a person's hands.  The virus is able to live on the skin and can infect other persons for up to 2 hours after direct contact.  Also, these can be transmitted when someone coughs or sneezes; thus, it is important to cover the mouth to reduce this risk.  To keep the spread of the illness at bay, good hand hygiene is very important.  This is an infection that is most likely caused by a virus. There are no specific treatments other than to help you with the symptoms until the infection runs its course.  We are sorry you are not feeling well.  Here is how we plan to help!   For nasal congestion, you may use an oral decongestants such as Mucinex D or if you have glaucoma or high blood pressure use plain Mucinex.  Saline nasal spray or nasal drops can help and can safely be used as often as needed for congestion.  For your congestion,  I have prescribed Fluticasone nasal spray one spray in each nostril twice a day  If you do not have a history of heart disease, hypertension, diabetes or thyroid disease, prostate/bladder issues or glaucoma, you may also use Sudafed to treat nasal congestion.  It is highly recommended that you consult with a pharmacist or your primary care physician to ensure this medication is safe for you to take.     If you have a cough, you may use cough suppressants such as Delsym and Robitussin.  If you have glaucoma or high blood pressure, you can also use Coricidin HBP.   For cough I have prescribed for you A prescription cough medication called Tessalon Perles 100 mg. You may take 1-2 capsules every 8 hours as needed for cough and prednisone dose pack.   If you have a sore or scratchy throat, use a saltwater gargle-  to  teaspoon of salt dissolved in a 4-ounce to 8-ounce glass of warm water.  Gargle the solution for approximately 15-30 seconds and then spit.  It is important not to swallow the solution.  You can also use throat lozenges/cough drops and Chloraseptic spray to help with throat pain or discomfort.  Warm or cold liquids can also be helpful in relieving throat pain.  For headache, pain or general discomfort, you can use Ibuprofen or Tylenol as directed.   Some authorities believe that zinc   sprays or the use of Echinacea may shorten the course of your symptoms.   HOME CARE . Only take medications as instructed by your medical team. . Be sure to drink plenty of fluids. Water is fine as well as fruit juices, sodas and electrolyte beverages. You may want to stay away from caffeine or alcohol. If you are nauseated, try taking small sips of liquids. How do you know if you are getting enough fluid? Your urine should be a pale yellow or almost colorless. . Get rest. . Taking a steamy shower or using a humidifier may help nasal congestion and ease sore throat pain. You can place a towel over your head  and breathe in the steam from hot water coming from a faucet. . Using a saline nasal spray works much the same way. . Cough drops, hard candies and sore throat lozenges may ease your cough. . Avoid close contacts especially the very [place] and the elderly . Cover your mouth if you cough or sneeze . Always remember to wash your hands.   GET HELP RIGHT AWAY IF: . You develop worsening fever. . If your symptoms do not improve within 10 days . You develop yellow or green discharge from your nose over 3 days. . You have coughing fits . You develop a severe head ache or visual changes. . You develop shortness of breath, difficulty breathing or start having chest pain . Your symptoms persist after you have completed your treatment plan  MAKE SURE YOU   Understand these instructions.  Will watch your condition.  Will get help right away if you are not doing well or get worse.  Your e-visit answers were reviewed by a board certified advanced clinical practitioner to complete your personal care plan. Depending upon the condition, your plan could have included both over the counter or prescription medications. Please review your pharmacy choice. If there is a problem, you may call our nursing hot line at and have the prescription routed to another pharmacy. Your safety is important to us. If you have drug allergies check your prescription carefully.   You can use MyChart to ask questions about today's visit, request a non-urgent call back, or ask for a work or school excuse for 24 hours related to this e-Visit. If it has been greater than 24 hours you will need to follow up with your provider, or enter a new e-Visit to address those concerns. You will get an e-mail in the next two days asking about your experience.  I hope that your e-visit has been valuable and will speed your recovery. Thank you for using e-visits.  Approximately 5 minutes was spent documenting and reviewing patient's chart.       

## 2020-10-16 ENCOUNTER — Other Ambulatory Visit: Payer: Self-pay | Admitting: Nurse Practitioner

## 2020-10-16 DIAGNOSIS — E1169 Type 2 diabetes mellitus with other specified complication: Secondary | ICD-10-CM

## 2020-11-12 ENCOUNTER — Other Ambulatory Visit: Payer: Self-pay

## 2020-11-12 ENCOUNTER — Encounter: Payer: Self-pay | Admitting: Family Medicine

## 2020-11-12 ENCOUNTER — Ambulatory Visit: Payer: Medicare Other | Admitting: Family Medicine

## 2020-11-12 VITALS — BP 121/72 | HR 103 | Temp 97.9°F | Ht 75.0 in | Wt 390.8 lb

## 2020-11-12 DIAGNOSIS — S91302D Unspecified open wound, left foot, subsequent encounter: Secondary | ICD-10-CM | POA: Diagnosis not present

## 2020-11-12 DIAGNOSIS — L089 Local infection of the skin and subcutaneous tissue, unspecified: Secondary | ICD-10-CM | POA: Diagnosis not present

## 2020-11-12 DIAGNOSIS — W19XXXA Unspecified fall, initial encounter: Secondary | ICD-10-CM | POA: Diagnosis not present

## 2020-11-12 MED ORDER — SULFAMETHOXAZOLE-TRIMETHOPRIM 800-160 MG PO TABS: 1.0000 | ORAL_TABLET | Freq: Two times a day (BID) | ORAL | 0 refills | Status: AC

## 2020-11-12 NOTE — Progress Notes (Signed)
Assessment & Plan:  1. Infected wound - Patient to cleanse wound and change dressing daily.  Encouraged to keep wound covered and as clean as possible as he is wearing crocs with no socks. - sulfamethoxazole-trimethoprim (BACTRIM DS) 800-160 MG tablet; Take 1 tablet by mouth 2 (two) times daily for 7 days.  Dispense: 14 tablet; Refill: 0  2. Open wound of left heel, subsequent encounter - Managed by Dr. Irving Shows.  He has a follow-up appointment tomorrow.  3. Fall - Patient did have a recent fall on laundry detergent that was spilled on the floor.  Encouraged to take his Celebrex twice daily instead of once daily as he has been for pain control.   Follow up plan: Return if symptoms worsen or fail to improve.  [name], MSN, APRN, FNP-C [facility]  Subjective:   Patient ID: Richard Crawford, male    DOB: 06-Dec-1955, 65 y.o.   MRN: [id number]  HPI: Richard Crawford is a 65 y.o. male presenting on 11/12/2020 for Cellulitis (Right lower leg x 3 days)  Patient reports he was running a fever of 102 three days ago.  It was then he noticed redness and warmth to the back of his left lower leg.  Patient has a wound on his left heel that is managed by Dr. Irving Shows, podiatrist.  Patient reports the same weekend he fell and hurt his buttock.  He is requesting something for pain.   ROS: Negative unless specifically indicated above in HPI.   Relevant past medical history reviewed and updated as indicated.   Allergies and medications reviewed and updated.   Current Outpatient Medications:  .  atorvastatin (LIPITOR) 20 MG tablet, TAKE 1 TABLET DAILY, Disp: 90 tablet, Rfl: 0 .  celecoxib (CELEBREX) 200 MG capsule, Take 200 mg by mouth 2 (two) times daily., Disp: , Rfl:  .  fluticasone (FLONASE) 50 MCG/ACT nasal spray, Place 2 sprays into both nostrils daily., Disp: 16 g, Rfl: 6 .  gabapentin (NEURONTIN) 100 MG capsule, Take 300 mg by mouth 3 (three) times daily., Disp: , Rfl:   .  glucose blood test strip, Check BS daily and as needed Dx E11.9, Disp: 100 each, Rfl: 3 .  HYDROcodone-acetaminophen (NORCO/VICODIN) 5-325 MG tablet, Take 1 tablet by mouth every 6 (six) hours as needed., Disp: , Rfl:  .  levothyroxine (SYNTHROID) 200 MCG tablet, TAKE 1 TABLET (200 MCG TOTAL) BY MOUTH DAILY BEFORE BREAKFAST (TAKE IN ADDITION TO 50MCG TABLET), Disp: 30 tablet, Rfl: 0 .  levothyroxine (SYNTHROID) 50 MCG tablet, TAKE 1 TABLET DAILY BEFORE BREAKFAST. (TAKE IN ADDITION TO 200MCG TABLET FOR TOTAL DOSE 250 MCG), Disp: 30 tablet, Rfl: 0 .  metFORMIN (GLUCOPHAGE-XR) 750 MG 24 hr tablet, TAKE 1 TABLET BY MOUTH EVERY DAY WITH BREAKFAST, Disp: 90 tablet, Rfl: 0 .  Multiple Vitamin (MULTIVITAMIN) tablet, Take 1 tablet by mouth 2 (two) times daily., Disp: , Rfl:  .  Prodigy Lancets 28G MISC, Check BS daily and as needed Dx E11.9, Disp: 100 each, Rfl: 3 .  rivaroxaban (XARELTO) 20 MG TABS tablet, TAKE 1 TABLET (20 MG TOTAL) BY MOUTH DAILY WITH SUPPER FOR 30 DAYS., Disp: 90 tablet, Rfl: 1 .  valsartan (DIOVAN) 80 MG tablet, Take 1 tablet (80 mg total) by mouth daily., Disp: 90 tablet, Rfl: 3 .  furosemide (LASIX) 80 MG tablet, Take 1 tablet (80 mg total) by mouth daily., Disp: 90 tablet, Rfl: 1 .  metoprolol tartrate (LOPRESSOR) 100 MG tablet, Take 2 tablets (  200 mg total) by mouth daily., Disp: 180 tablet, Rfl: 1  Allergies  Allergen Reactions  . Morphine And Related Nausea And Vomiting    Objective:   BP 121/72   Pulse (!) 103   Temp 97.9 F (36.6 C) (Temporal)   Ht 6\' 3"  (1.905 m)   Wt (!) 390 lb 12.8 oz (177.3 kg)   SpO2 97%   BMI 48.85 kg/m    Physical Exam Vitals reviewed.  Constitutional:      General: He is not in acute distress.    Appearance: Normal appearance. He is morbidly obese. He is not ill-appearing, toxic-appearing or diaphoretic.  HENT:     Head: Normocephalic and atraumatic.  Eyes:     General: No scleral icterus.       Right eye: No discharge.         Left eye: No discharge.     Conjunctiva/sclera: Conjunctivae normal.  Cardiovascular:     Rate and Rhythm: Normal rate.  Pulmonary:     Effort: Pulmonary effort is normal. No respiratory distress.  Musculoskeletal:        General: Normal range of motion.     Cervical back: Normal range of motion.  Skin:    General: Skin is warm and dry.     Comments: Mild erythema and warmth to the back of patient's left lower extremity.  More erythema and warmth surrounding the wound on his left heel.  Foul odor coming from the wound.  Neurological:     Mental Status: He is alert and oriented to person, place, and time. Mental status is at baseline.  Psychiatric:        Mood and Affect: Mood normal.        Behavior: Behavior normal.        Thought Content: Thought content normal.        Judgment: Judgment normal.

## 2020-11-12 NOTE — Patient Instructions (Signed)
Cleanse wound daily. Keep covered with clean/dry dressing. Keep clean - be careful with crocs as your foot is exposed.

## 2020-11-26 ENCOUNTER — Other Ambulatory Visit: Payer: Self-pay

## 2020-11-26 ENCOUNTER — Ambulatory Visit: Payer: Medicare Other | Admitting: Family Medicine

## 2020-11-26 ENCOUNTER — Encounter: Payer: Self-pay | Admitting: Family Medicine

## 2020-11-26 VITALS — BP 119/71 | HR 68 | Temp 98.0°F | Ht 75.0 in | Wt 389.4 lb

## 2020-11-26 DIAGNOSIS — I4821 Permanent atrial fibrillation: Secondary | ICD-10-CM | POA: Diagnosis not present

## 2020-11-26 DIAGNOSIS — E119 Type 2 diabetes mellitus without complications: Secondary | ICD-10-CM | POA: Diagnosis not present

## 2020-11-26 DIAGNOSIS — I5032 Chronic diastolic (congestive) heart failure: Secondary | ICD-10-CM

## 2020-11-26 DIAGNOSIS — I152 Hypertension secondary to endocrine disorders: Secondary | ICD-10-CM

## 2020-11-26 DIAGNOSIS — E785 Hyperlipidemia, unspecified: Secondary | ICD-10-CM

## 2020-11-26 DIAGNOSIS — R6882 Decreased libido: Secondary | ICD-10-CM

## 2020-11-26 DIAGNOSIS — Z6841 Body Mass Index (BMI) 40.0 and over, adult: Secondary | ICD-10-CM

## 2020-11-26 DIAGNOSIS — E1159 Type 2 diabetes mellitus with other circulatory complications: Secondary | ICD-10-CM

## 2020-11-26 DIAGNOSIS — E1169 Type 2 diabetes mellitus with other specified complication: Secondary | ICD-10-CM | POA: Diagnosis not present

## 2020-11-26 DIAGNOSIS — R5383 Other fatigue: Secondary | ICD-10-CM

## 2020-11-26 LAB — BAYER DCA HB A1C WAIVED: HB A1C (BAYER DCA - WAIVED): 6.8 % (ref <7.0)

## 2020-11-26 MED ORDER — RIVAROXABAN 20 MG PO TABS: ORAL_TABLET | ORAL | 1 refills | Status: DC

## 2020-11-26 MED ORDER — METFORMIN HCL ER 750 MG PO TB24
ORAL_TABLET | ORAL | 1 refills | Status: DC
Start: 2020-11-26 — End: 2021-06-09

## 2020-11-26 MED ORDER — SPIRONOLACTONE 25 MG PO TABS: 25.0000 mg | ORAL_TABLET | Freq: Every day | ORAL | 2 refills | Status: DC

## 2020-11-26 NOTE — Progress Notes (Signed)
Assessment & Plan:  1. Type 2 diabetes mellitus without complication, without long-term current use of insulin (HCC) Lab Results  Component Value Date   HGBA1C 6.8 11/26/2020   HGBA1C 6.9 08/26/2020   HGBA1C 6.4 05/05/2020    - Diabetes is at goal of A1c < 7. - Medications: continue current medications - Patient is currently taking a statin. Patient is taking an ACE-inhibitor/ARB.   Diabetes Health Maintenance Due  Topic Date Due  . FOOT EXAM  01/29/2021  . HEMOGLOBIN A1C  05/26/2021  . OPHTHALMOLOGY EXAM  09/17/2021    Lab Results  Component Value Date   LABMICR 5.9 05/05/2020   - Lipid panel - CBC with Differential/Platelet - CMP14+EGFR - Bayer DCA Hb A1c Waived - metFORMIN (GLUCOPHAGE-XR) 750 MG 24 hr tablet; 1 tablet daily  Dispense: 90 tablet; Refill: 1  2. Hypertension associated with diabetes ([place]) Well controlled on current regimen.  - Lipid panel - CBC with Differential/Platelet - CMP14+EGFR  3. Hyperlipidemia associated with type 2 diabetes mellitus ([place]) Labs to assess. - Lipid panel - CMP14+EGFR  4. Permanent atrial fibrillation (HCC) Well controlled on current regimen.  - CBC with Differential/Platelet - CMP14+EGFR - rivaroxaban (XARELTO) 20 MG TABS tablet; TAKE 1 TABLET (20 MG TOTAL) BY MOUTH DAILY WITH SUPPER FOR 30 DAYS.  Dispense: 90 tablet; Refill: 1  5. Chronic heart failure with preserved ejection fraction (HCC) Rx'd spironolactone. - CMP14+EGFR - spironolactone (ALDACTONE) 25 MG tablet; Take 1 tablet (25 mg total) by mouth daily.  Dispense: 30 tablet; Refill: 2  6. Other fatigue Will chest testosterone level at follow-up that is scheduled before 10 AM.   7. Low libido Will chest testosterone level at follow-up that is scheduled before 10 AM.   8. Morbid obesity with BMI of 50.0-59.9, adult (HCC) Diet and exercise encouraged.    Return as scheduled.  [name], MSN, APRN, FNP-C [facility]  Subjective:     Patient ID: Richard Crawford, male    DOB: 1956/09/23, 65 y.o.   MRN: [id number]  Patient Care Team: Loman Brooklyn, FNP as PCP - General (Family Medicine) Minus Breeding, MD as PCP - Cardiology (Cardiology)   Chief Complaint:  Chief Complaint  Patient presents with  . Diabetes    Check up of chronic medical conditions     HPI: Richard Crawford is a 65 y.o. male presenting on 11/26/2020 for Diabetes (Check up of chronic medical conditions )  Diabetes: Patient presents for follow up of diabetes. Known diabetic complications: cardiovascular disease. Medication compliance: yes. Is he  on ACE inhibitor or angiotensin II receptor blocker? Yes. Is he on a statin? Yes.   Edema: patient reports he use to have a prescription for spironolactone before he moved and it somehow got dropped off his medication list. He would like this prescribed again to help with his edema.   New complaints: Patient is upset because of his weight and fatigue. He is seeing endocrinologist and he states they report his levels are right where they need to be so they do not want to increase his dosage as he is requesting.    Social history:  Relevant past medical, surgical, family and social history reviewed and updated as indicated. Interim medical history since our last visit reviewed.  Allergies and medications reviewed and updated.  DATA REVIEWED: CHART IN EPIC  ROS: Negative unless specifically indicated above in HPI.    Current Outpatient Medications:  .  atorvastatin (LIPITOR) 20 MG tablet, TAKE 1  TABLET DAILY, Disp: 90 tablet, Rfl: 0 .  celecoxib (CELEBREX) 200 MG capsule, Take 200 mg by mouth 2 (two) times daily., Disp: , Rfl:  .  fluticasone (FLONASE) 50 MCG/ACT nasal spray, Place 2 sprays into both nostrils daily., Disp: 16 g, Rfl: 6 .  gabapentin (NEURONTIN) 100 MG capsule, Take 300 mg by mouth 3 (three) times daily., Disp: , Rfl:  .  glucose blood test strip, Check BS daily and as needed Dx E11.9,  Disp: 100 each, Rfl: 3 .  HYDROcodone-acetaminophen (NORCO/VICODIN) 5-325 MG tablet, Take 1 tablet by mouth every 6 (six) hours as needed., Disp: , Rfl:  .  levothyroxine (SYNTHROID) 200 MCG tablet, TAKE 1 TABLET (200 MCG TOTAL) BY MOUTH DAILY BEFORE BREAKFAST (TAKE IN ADDITION TO 50MCG TABLET), Disp: 30 tablet, Rfl: 0 .  levothyroxine (SYNTHROID) 50 MCG tablet, TAKE 1 TABLET DAILY BEFORE BREAKFAST. (TAKE IN ADDITION TO 200MCG TABLET FOR TOTAL DOSE 250 MCG), Disp: 30 tablet, Rfl: 0 .  metFORMIN (GLUCOPHAGE-XR) 750 MG 24 hr tablet, TAKE 1 TABLET BY MOUTH EVERY DAY WITH BREAKFAST, Disp: 90 tablet, Rfl: 0 .  Multiple Vitamin (MULTIVITAMIN) tablet, Take 1 tablet by mouth 2 (two) times daily., Disp: , Rfl:  .  Prodigy Lancets 28G MISC, Check BS daily and as needed Dx E11.9, Disp: 100 each, Rfl: 3 .  rivaroxaban (XARELTO) 20 MG TABS tablet, TAKE 1 TABLET (20 MG TOTAL) BY MOUTH DAILY WITH SUPPER FOR 30 DAYS., Disp: 90 tablet, Rfl: 1 .  valsartan (DIOVAN) 80 MG tablet, Take 1 tablet (80 mg total) by mouth daily., Disp: 90 tablet, Rfl: 3 .  furosemide (LASIX) 80 MG tablet, Take 1 tablet (80 mg total) by mouth daily., Disp: 90 tablet, Rfl: 1 .  metoprolol tartrate (LOPRESSOR) 100 MG tablet, Take 2 tablets (200 mg total) by mouth daily., Disp: 180 tablet, Rfl: 1   Allergies  Allergen Reactions  . Morphine And Related Nausea And Vomiting   Past Medical History:  Diagnosis Date  . Anxiety   . Arthritis    left shoulder  . Atrial fibrillation ([place])   . Cancer ([place]) 1989   thyroid  . Cellulitis   . Chronic heart failure with preserved ejection fraction ([place])   . Diabetes mellitus without complication ([place])   . Hemorrhoids    external  . Hx of colonic polyps   . Hyperlipidemia   . Hypertension   . Migraine   . Papillary thyroid carcinoma ([place])   . Postoperative hypothyroidism   . Primary osteoarthritis of left shoulder   . Sleep apnea   . Venous stasis dermatitis of both lower extremities      Past Surgical History:  Procedure Laterality Date  . ANKLE FUSION    . KNEE ARTHROSCOPY    . SHOULDER ARTHROSCOPY    . TOTAL SHOULDER REPLACEMENT Right     Social History   Socioeconomic History  . Marital status: Married    Spouse name: Not on file  . Number of children: Not on file  . Years of education: Not on file  . Highest education level: Not on file  Occupational History  . Not on file  Tobacco Use  . Smoking status: Never Smoker  . Smokeless tobacco: Never Used  Substance and Sexual Activity  . Alcohol use: Not Currently  . Drug use: Never  . Sexual activity: Not on file  Other Topics Concern  . Not on file  Social History Narrative  . Not on file   Social  Determinants of Health   Financial Resource Strain: Not on file  Food Insecurity: Not on file  Transportation Needs: Not on file  Physical Activity: Not on file  Stress: Not on file  Social Connections: Not on file  Intimate Partner Violence: Not on file        Objective:    BP 119/71   Pulse 68   Temp 98 F (36.7 C) (Temporal)   Ht $R'6\' 3"'mE$  (1.905 m)   Wt (!) 389 lb 6.4 oz (176.6 kg)   SpO2 96%   BMI 48.67 kg/m   Wt Readings from Last 3 Encounters:  11/26/20 (!) 389 lb 6.4 oz (176.6 kg)  11/12/20 (!) 390 lb 12.8 oz (177.3 kg)  08/26/20 (!) 384 lb (174.2 kg)    Physical Exam Vitals reviewed.  Constitutional:      General: He is not in acute distress.    Appearance: Normal appearance. He is morbidly obese. He is not ill-appearing, toxic-appearing or diaphoretic.  HENT:     Head: Normocephalic and atraumatic.  Eyes:     General: No scleral icterus.       Right eye: No discharge.        Left eye: No discharge.     Conjunctiva/sclera: Conjunctivae normal.  Cardiovascular:     Rate and Rhythm: Normal rate and regular rhythm.     Heart sounds: Normal heart sounds. No murmur heard. No friction rub. No gallop.   Pulmonary:     Effort: Pulmonary effort is normal. No respiratory distress.      Breath sounds: Normal breath sounds. No stridor. No wheezing, rhonchi or rales.  Musculoskeletal:        General: Normal range of motion.     Cervical back: Normal range of motion.     Right lower leg: Edema present.     Left lower leg: Edema present.  Skin:    General: Skin is warm and dry.     Findings: Wound (left heel - no odor, warmth, or erythema) present.  Neurological:     Mental Status: He is alert and oriented to person, place, and time. Mental status is at baseline.  Psychiatric:        Mood and Affect: Mood normal.        Behavior: Behavior normal.        Thought Content: Thought content normal.        Judgment: Judgment normal.     Lab Results  Component Value Date   TSH 1.380 08/26/2020   Lab Results  Component Value Date   WBC 7.4 08/26/2020   HGB 13.3 08/26/2020   HCT 39.9 08/26/2020   MCV 86 08/26/2020   PLT 165 08/26/2020   Lab Results  Component Value Date   NA 144 08/26/2020   K 4.3 08/26/2020   CO2 30 (H) 08/26/2020   GLUCOSE 147 (H) 08/26/2020   BUN 15 08/26/2020   CREATININE 1.02 08/26/2020   BILITOT 0.7 08/26/2020   ALKPHOS 111 08/26/2020   AST 21 08/26/2020   ALT 17 08/26/2020   PROT 7.7 08/26/2020   ALBUMIN 4.6 08/26/2020   CALCIUM 9.3 08/26/2020   ANIONGAP 9 08/05/2019   Lab Results  Component Value Date   CHOL 91 (L) 08/26/2020   Lab Results  Component Value Date   HDL 37 (L) 08/26/2020   Lab Results  Component Value Date   LDLCALC 37 08/26/2020   Lab Results  Component Value Date   TRIG 86 08/26/2020  Lab Results  Component Value Date   CHOLHDL 2.5 08/26/2020   Lab Results  Component Value Date   HGBA1C 6.9 08/26/2020

## 2020-11-27 LAB — LIPID PANEL
Chol/HDL Ratio: 2.9 ratio (ref 0.0–5.0)
Cholesterol, Total: 99 mg/dL — ABNORMAL LOW (ref 100–199)
HDL: 34 mg/dL — ABNORMAL LOW (ref >39)
LDL Chol Calc (NIH): 41 mg/dL (ref 0–99)
Triglycerides: 133 mg/dL (ref 0–149)
VLDL Cholesterol Cal: 24 mg/dL (ref 5–40)

## 2020-11-27 LAB — CBC WITH DIFFERENTIAL/PLATELET
Basophils Absolute: 0.1 10*3/uL (ref 0.0–0.2)
Basos: 1 %
EOS (ABSOLUTE): 0.3 10*3/uL (ref 0.0–0.4)
Eos: 3 %
Hematocrit: 39.9 % (ref 37.5–51.0)
Hemoglobin: 13.2 g/dL (ref 13.0–17.7)
Immature Grans (Abs): 0 10*3/uL (ref 0.0–0.1)
Immature Granulocytes: 0 %
Lymphocytes Absolute: 3.2 10*3/uL — ABNORMAL HIGH (ref 0.7–3.1)
Lymphs: 39 %
MCH: 28.3 pg (ref 26.6–33.0)
MCHC: 33.1 g/dL (ref 31.5–35.7)
MCV: 86 fL (ref 79–97)
Monocytes Absolute: 1.4 10*3/uL — ABNORMAL HIGH (ref 0.1–0.9)
Monocytes: 16 %
Neutrophils Absolute: 3.3 10*3/uL (ref 1.4–7.0)
Neutrophils: 41 %
Platelets: 259 10*3/uL (ref 150–450)
RBC: 4.66 x10E6/uL (ref 4.14–5.80)
RDW: 13 % (ref 11.6–15.4)
WBC: 8.3 10*3/uL (ref 3.4–10.8)

## 2020-11-27 LAB — CMP14+EGFR
ALT: 25 IU/L (ref 0–44)
AST: 28 IU/L (ref 0–40)
Albumin/Globulin Ratio: 1.2 (ref 1.2–2.2)
Albumin: 4.5 g/dL (ref 3.8–4.8)
Alkaline Phosphatase: 139 IU/L — ABNORMAL HIGH (ref 44–121)
BUN/Creatinine Ratio: 14 (ref 10–24)
BUN: 15 mg/dL (ref 8–27)
Bilirubin Total: 0.8 mg/dL (ref 0.0–1.2)
CO2: 26 mmol/L (ref 20–29)
Calcium: 9.3 mg/dL (ref 8.6–10.2)
Chloride: 97 mmol/L (ref 96–106)
Creatinine, Ser: 1.04 mg/dL (ref 0.76–1.27)
GFR calc Af Amer: 86 mL/min/{1.73_m2} (ref >59)
GFR calc non Af Amer: 74 mL/min/{1.73_m2} (ref >59)
Globulin, Total: 3.7 g/dL (ref 1.5–4.5)
Glucose: 89 mg/dL (ref 65–99)
Potassium: 4.4 mmol/L (ref 3.5–5.2)
Sodium: 140 mmol/L (ref 134–144)
Total Protein: 8.2 g/dL (ref 6.0–8.5)

## 2020-12-01 ENCOUNTER — Encounter: Payer: Self-pay | Admitting: Family Medicine

## 2020-12-01 DIAGNOSIS — Z85850 Personal history of malignant neoplasm of thyroid: Secondary | ICD-10-CM | POA: Insufficient documentation

## 2020-12-11 ENCOUNTER — Other Ambulatory Visit: Payer: Self-pay

## 2020-12-11 ENCOUNTER — Encounter: Payer: Self-pay | Admitting: Family Medicine

## 2020-12-11 ENCOUNTER — Ambulatory Visit: Payer: Medicare Other | Admitting: Family Medicine

## 2020-12-11 VITALS — BP 135/80 | HR 83 | Temp 97.5°F | Ht 75.0 in | Wt 387.2 lb

## 2020-12-11 DIAGNOSIS — R7989 Other specified abnormal findings of blood chemistry: Secondary | ICD-10-CM

## 2020-12-11 DIAGNOSIS — E785 Hyperlipidemia, unspecified: Secondary | ICD-10-CM

## 2020-12-11 DIAGNOSIS — R6882 Decreased libido: Secondary | ICD-10-CM | POA: Diagnosis not present

## 2020-12-11 DIAGNOSIS — E1159 Type 2 diabetes mellitus with other circulatory complications: Secondary | ICD-10-CM

## 2020-12-11 DIAGNOSIS — R5383 Other fatigue: Secondary | ICD-10-CM

## 2020-12-11 DIAGNOSIS — I152 Hypertension secondary to endocrine disorders: Secondary | ICD-10-CM

## 2020-12-11 DIAGNOSIS — Z6841 Body Mass Index (BMI) 40.0 and over, adult: Secondary | ICD-10-CM

## 2020-12-11 DIAGNOSIS — E1169 Type 2 diabetes mellitus with other specified complication: Secondary | ICD-10-CM

## 2020-12-11 DIAGNOSIS — E119 Type 2 diabetes mellitus without complications: Secondary | ICD-10-CM

## 2020-12-11 MED ORDER — METOPROLOL TARTRATE 100 MG PO TABS: 200.0000 mg | ORAL_TABLET | Freq: Every day | ORAL | 1 refills | Status: DC

## 2020-12-11 MED ORDER — OZEMPIC (0.25 OR 0.5 MG/DOSE) 2 MG/1.5ML ~~LOC~~ SOPN: 0.2500 mg | PEN_INJECTOR | SUBCUTANEOUS | 1 refills | Status: DC

## 2020-12-11 MED ORDER — ATORVASTATIN CALCIUM 20 MG PO TABS: 20.0000 mg | ORAL_TABLET | Freq: Every day | ORAL | 1 refills | Status: DC

## 2020-12-11 NOTE — Progress Notes (Signed)
Assessment & Plan:  1-3. Low testosterone/Other fatigue/Low libido Labs to assess. - Testosterone  4. Hypertension associated with diabetes ([place]) Well controlled on current regimen.  - metoprolol tartrate (LOPRESSOR) 100 MG tablet; Take 2 tablets (200 mg total) by mouth daily.  Dispense: 180 tablet; Refill: 1 - CMP14+EGFR  5. Morbid obesity with BMI of 50.0-59.9, adult ([place]) Started patient on Ozempic 0.25 mg once weekly.  Continue diet and exercise.  Will increase to 0.5 mg at follow-up if patient is tolerating well. - CMP14+EGFR - Semaglutide,0.25 or 0.5MG /DOS, (OZEMPIC, 0.25 OR 0.5 MG/DOSE,) 2 MG/1.5ML SOPN; Inject 0.25 mg into the skin once a week.  Dispense: 1 mL; Refill: 1  6. Type 2 diabetes mellitus without complication, without long-term current use of insulin ([place]) Started patient on Ozempic 0.25 mg - CMP14+EGFR - Semaglutide,0.25 or 0.5MG /DOS, (OZEMPIC, 0.25 OR 0.5 MG/DOSE,) 2 MG/1.5ML SOPN; Inject 0.25 mg into the skin once a week.  Dispense: 1 mL; Refill: 1  7. Hyperlipidemia associated with type 2 diabetes mellitus ([place]) Well controlled on current regimen.  - atorvastatin (LIPITOR) 20 MG tablet; Take 1 tablet (20 mg total) by mouth daily.  Dispense: 90 tablet; Refill: 1   Follow up plan: Return in about 3 months (around 03/13/2021) for follow-up of chronic medication conditions; also 6 week for Ozempic.  [name], MSN, APRN, FNP-C [facility]  Subjective:   Patient ID: Richard Crawford, male    DOB: January 17, 1956, 65 y.o.   MRN: [id number]  HPI: Richard Crawford is a 65 y.o. male presenting on 12/11/2020 for Medical Management of Chronic Issues  Patient is following up from the addition of spironolactone.  He reports he is doing well and his edema has decreased some.  He was also going to get a skin tag removed today but reports he took care of it himself.  Patient reports he has been trying to lose weight with diet and exercise.  He has really  increased his exercise and working out.  He is down 3 pounds from a month ago.   ROS: Negative unless specifically indicated above in HPI.   Relevant past medical history reviewed and updated as indicated.   Allergies and medications reviewed and updated.   Current Outpatient Medications:  .  celecoxib (CELEBREX) 200 MG capsule, Take 200 mg by mouth 2 (two) times daily., Disp: , Rfl:  .  fluticasone (FLONASE) 50 MCG/ACT nasal spray, Place 2 sprays into both nostrils daily., Disp: 16 g, Rfl: 6 .  gabapentin (NEURONTIN) 100 MG capsule, Take 300 mg by mouth 3 (three) times daily., Disp: , Rfl:  .  glucose blood test strip, Check BS daily and as needed Dx E11.9, Disp: 100 each, Rfl: 3 .  HYDROcodone-acetaminophen (NORCO/VICODIN) 5-325 MG tablet, Take 1 tablet by mouth every 6 (six) hours as needed., Disp: , Rfl:  .  levothyroxine (SYNTHROID) 200 MCG tablet, TAKE 1 TABLET (200 MCG TOTAL) BY MOUTH DAILY BEFORE BREAKFAST (TAKE IN ADDITION TO 50MCG TABLET), Disp: 30 tablet, Rfl: 0 .  levothyroxine (SYNTHROID) 50 MCG tablet, TAKE 1 TABLET DAILY BEFORE BREAKFAST. (TAKE IN ADDITION TO 200MCG TABLET FOR TOTAL DOSE 250 MCG), Disp: 30 tablet, Rfl: 0 .  metFORMIN (GLUCOPHAGE-XR) 750 MG 24 hr tablet, 1 tablet daily, Disp: 90 tablet, Rfl: 1 .  Multiple Vitamin (MULTIVITAMIN) tablet, Take 1 tablet by mouth 2 (two) times daily., Disp: , Rfl:  .  Prodigy Lancets 28G MISC, Check BS daily and as needed Dx E11.9, Disp: 100 each,  Rfl: 3 .  rivaroxaban (XARELTO) 20 MG TABS tablet, TAKE 1 TABLET (20 MG TOTAL) BY MOUTH DAILY WITH SUPPER FOR 30 DAYS., Disp: 90 tablet, Rfl: 1 .  Semaglutide,0.25 or 0.5MG /DOS, (OZEMPIC, 0.25 OR 0.5 MG/DOSE,) 2 MG/1.5ML SOPN, Inject 0.25 mg into the skin once a week., Disp: 1 mL, Rfl: 1 .  spironolactone (ALDACTONE) 25 MG tablet, Take 1 tablet (25 mg total) by mouth daily., Disp: 30 tablet, Rfl: 2 .  valsartan (DIOVAN) 80 MG tablet, Take 1 tablet (80 mg total) by mouth daily., Disp: 90  tablet, Rfl: 3 .  atorvastatin (LIPITOR) 20 MG tablet, Take 1 tablet (20 mg total) by mouth daily., Disp: 90 tablet, Rfl: 1 .  furosemide (LASIX) 80 MG tablet, Take 1 tablet (80 mg total) by mouth daily., Disp: 90 tablet, Rfl: 1 .  metoprolol tartrate (LOPRESSOR) 100 MG tablet, Take 2 tablets (200 mg total) by mouth daily., Disp: 180 tablet, Rfl: 1  Allergies  Allergen Reactions  . Morphine And Related Nausea And Vomiting    Objective:   BP 135/80   Pulse 83   Temp (!) 97.5 F (36.4 C)   Ht 6\' 3"  (1.905 m)   Wt (!) 387 lb 3.2 oz (175.6 kg)   SpO2 99%   BMI 48.40 kg/m    Physical Exam Vitals reviewed.  Constitutional:      General: He is not in acute distress.    Appearance: Normal appearance. He is morbidly obese. He is not ill-appearing, toxic-appearing or diaphoretic.  HENT:     Head: Normocephalic and atraumatic.  Eyes:     General: No scleral icterus.       Right eye: No discharge.        Left eye: No discharge.     Conjunctiva/sclera: Conjunctivae normal.  Cardiovascular:     Rate and Rhythm: Normal rate and regular rhythm.     Heart sounds: Normal heart sounds. No murmur heard. No friction rub. No gallop.   Pulmonary:     Effort: Pulmonary effort is normal. No respiratory distress.     Breath sounds: Normal breath sounds. No stridor. No wheezing, rhonchi or rales.  Musculoskeletal:        General: Normal range of motion.     Cervical back: Normal range of motion.  Skin:    General: Skin is warm and dry.  Neurological:     Mental Status: He is alert and oriented to person, place, and time. Mental status is at baseline.  Psychiatric:        Mood and Affect: Mood normal.        Behavior: Behavior normal.        Thought Content: Thought content normal.        Judgment: Judgment normal.

## 2020-12-12 LAB — TESTOSTERONE: Testosterone: 86 ng/dL — ABNORMAL LOW (ref 264–916)

## 2020-12-13 LAB — CMP14+EGFR
ALT: 21 IU/L (ref 0–44)
AST: 25 IU/L (ref 0–40)
Albumin/Globulin Ratio: 1.2 (ref 1.2–2.2)
Albumin: 4.3 g/dL (ref 3.8–4.8)
Alkaline Phosphatase: 128 IU/L — ABNORMAL HIGH (ref 44–121)
BUN/Creatinine Ratio: 19 (ref 10–24)
BUN: 20 mg/dL (ref 8–27)
Bilirubin Total: 0.6 mg/dL (ref 0.0–1.2)
CO2: 23 mmol/L (ref 20–29)
Calcium: 9.5 mg/dL (ref 8.6–10.2)
Chloride: 100 mmol/L (ref 96–106)
Creatinine, Ser: 1.06 mg/dL (ref 0.76–1.27)
Globulin, Total: 3.6 g/dL (ref 1.5–4.5)
Glucose: 158 mg/dL — ABNORMAL HIGH (ref 65–99)
Potassium: 4.5 mmol/L (ref 3.5–5.2)
Sodium: 141 mmol/L (ref 134–144)
Total Protein: 7.9 g/dL (ref 6.0–8.5)
eGFR: 77 mL/min/{1.73_m2} (ref >59)

## 2020-12-13 LAB — SPECIMEN STATUS REPORT

## 2020-12-14 ENCOUNTER — Encounter: Payer: Self-pay | Admitting: Family Medicine

## 2020-12-14 DIAGNOSIS — R7989 Other specified abnormal findings of blood chemistry: Secondary | ICD-10-CM

## 2020-12-14 HISTORY — DX: Other specified abnormal findings of blood chemistry: R79.89

## 2020-12-21 ENCOUNTER — Other Ambulatory Visit: Payer: Self-pay | Admitting: Family Medicine

## 2020-12-21 DIAGNOSIS — R7989 Other specified abnormal findings of blood chemistry: Secondary | ICD-10-CM

## 2020-12-21 MED ORDER — TESTOSTERONE CYPIONATE 200 MG/ML IM SOLN
200.0000 mg | INTRAMUSCULAR | 1 refills | Status: DC
Start: 2020-12-21 — End: 2021-07-08

## 2021-01-16 ENCOUNTER — Other Ambulatory Visit: Payer: Self-pay | Admitting: Family Medicine

## 2021-01-28 ENCOUNTER — Encounter: Payer: Self-pay | Admitting: *Deleted

## 2021-01-29 ENCOUNTER — Ambulatory Visit: Payer: Medicare Other | Admitting: Family Medicine

## 2021-01-29 DIAGNOSIS — E119 Type 2 diabetes mellitus without complications: Secondary | ICD-10-CM

## 2021-01-29 DIAGNOSIS — Z6841 Body Mass Index (BMI) 40.0 and over, adult: Secondary | ICD-10-CM

## 2021-01-29 MED ORDER — OZEMPIC (0.25 OR 0.5 MG/DOSE) 2 MG/1.5ML ~~LOC~~ SOPN: 0.5000 mg | PEN_INJECTOR | SUBCUTANEOUS | 1 refills | Status: DC

## 2021-01-29 NOTE — Progress Notes (Signed)
Virtual Visit via Telephone Note  I connected with Richard Crawford on 01/29/21 at 3:05 PM by telephone and verified that I am speaking with the correct person using two identifiers. Richard Crawford is currently located in his vehicle and nobody is currently with him during this visit. The provider, Richard Brooklyn, FNP is located in their office at time of visit.  I discussed the limitations, risks, security and privacy concerns of performing an evaluation and management service by telephone and the availability of in person appointments. I also discussed with the patient that there may be a patient responsible charge related to this service. The patient expressed understanding and agreed to proceed.  Subjective: PCP: Richard Brooklyn, FNP  Chief Complaint  Patient presents with  . Obesity  . Diabetes   Patient is following up on Ozempic.  He was started on 0.25 mg once weekly at our last visit due to morbid obesity and diabetes.  He reports he did not get it immediately but has had 5 doses now.  He is tolerating it well.   ROS: Per HPI  Current Outpatient Medications:  .  atorvastatin (LIPITOR) 20 MG tablet, Take 1 tablet (20 mg total) by mouth daily., Disp: 90 tablet, Rfl: 1 .  celecoxib (CELEBREX) 200 MG capsule, Take 200 mg by mouth 2 (two) times daily., Disp: , Rfl:  .  fluticasone (FLONASE) 50 MCG/ACT nasal spray, Place 2 sprays into both nostrils daily., Disp: 16 g, Rfl: 6 .  furosemide (LASIX) 80 MG tablet, Take 1 tablet (80 mg total) by mouth daily., Disp: 90 tablet, Rfl: 1 .  gabapentin (NEURONTIN) 100 MG capsule, TAKE 3 CAPSULES THREE TIMES DAILY, Disp: 270 capsule, Rfl: 3 .  glucose blood test strip, Check BS daily and as needed Dx E11.9, Disp: 100 each, Rfl: 3 .  HYDROcodone-acetaminophen (NORCO/VICODIN) 5-325 MG tablet, Take 1 tablet by mouth every 6 (six) hours as needed., Disp: , Rfl:  .  levothyroxine (SYNTHROID) 200 MCG tablet, TAKE 1 TABLET (200 MCG TOTAL) BY MOUTH DAILY  BEFORE BREAKFAST (TAKE IN ADDITION TO 50MCG TABLET), Disp: 30 tablet, Rfl: 0 .  levothyroxine (SYNTHROID) 50 MCG tablet, TAKE 1 TABLET DAILY BEFORE BREAKFAST. (TAKE IN ADDITION TO 200MCG TABLET FOR TOTAL DOSE 250 MCG), Disp: 30 tablet, Rfl: 0 .  metFORMIN (GLUCOPHAGE-XR) 750 MG 24 hr tablet, 1 tablet daily, Disp: 90 tablet, Rfl: 1 .  metoprolol tartrate (LOPRESSOR) 100 MG tablet, Take 2 tablets (200 mg total) by mouth daily., Disp: 180 tablet, Rfl: 1 .  Multiple Vitamin (MULTIVITAMIN) tablet, Take 1 tablet by mouth 2 (two) times daily., Disp: , Rfl:  .  Prodigy Lancets 28G MISC, Check BS daily and as needed Dx E11.9, Disp: 100 each, Rfl: 3 .  rivaroxaban (XARELTO) 20 MG TABS tablet, TAKE 1 TABLET (20 MG TOTAL) BY MOUTH DAILY WITH SUPPER FOR 30 DAYS., Disp: 90 tablet, Rfl: 1 .  Semaglutide,0.25 or 0.5MG /DOS, (OZEMPIC, 0.25 OR 0.5 MG/DOSE,) 2 MG/1.5ML SOPN, Inject 0.25 mg into the skin once a week., Disp: 1 mL, Rfl: 1 .  spironolactone (ALDACTONE) 25 MG tablet, Take 1 tablet (25 mg total) by mouth daily., Disp: 30 tablet, Rfl: 2 .  testosterone cypionate (DEPOTESTOSTERONE CYPIONATE) 200 MG/ML injection, Inject 1 mL (200 mg total) into the muscle every 14 (fourteen) days., Disp: 10 mL, Rfl: 1 .  valsartan (DIOVAN) 80 MG tablet, Take 1 tablet (80 mg total) by mouth daily., Disp: 90 tablet, Rfl: 3  Allergies  Allergen Reactions  .  Morphine And Related Nausea And Vomiting   Past Medical History:  Diagnosis Date  . Anxiety   . Arthritis    left shoulder  . Atrial fibrillation ([place])   . Cancer ([place]) 1989   thyroid  . Cellulitis   . Chronic heart failure with preserved ejection fraction (Richard Crawford)   . Diabetes mellitus without complication (Richard Crawford)   . Hemorrhoids    external  . Hx of colonic polyps   . Hyperlipidemia   . Hypertension   . 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 Migraine   . Papillary thyroid carcinoma (Richard Crawford)   . Postoperative hypothyroidism   . Primary osteoarthritis of left shoulder   .  Sleep apnea   . Venous stasis dermatitis of both lower extremities     Observations/Objective: A&O  No respiratory distress or wheezing audible over the phone Mood, judgement, and thought processes all WNL   Assessment and Plan: 1. Morbid obesity with BMI of 50.0-59.9, adult (HCC) Ozempic dose increased from 0.25 mg to 0.5 mg once weekly. - Semaglutide,0.25 or 0.5MG /DOS, (OZEMPIC, 0.25 OR 0.5 MG/DOSE,) 2 MG/1.5ML SOPN; Inject 0.5 mg into the skin once a week.  Dispense: 3 mL; Refill: 1  2. Type 2 diabetes mellitus without complication, without long-term current use of insulin (HCC) Ozempic dose increased from 0.25 mg to 0.5 mg once weekly. - Semaglutide,0.25 or 0.5MG /DOS, (OZEMPIC, 0.25 OR 0.5 MG/DOSE,) 2 MG/1.5ML SOPN; Inject 0.5 mg into the skin once a week.  Dispense: 3 mL; Refill: 1   Follow Up Instructions: Return as scheduled.  I discussed the assessment and treatment plan with the patient. The patient was provided an opportunity to ask questions and all were answered. The patient agreed with the plan and demonstrated an understanding of the instructions.   The patient was advised to call back or seek an in-person evaluation if the symptoms worsen or if the condition fails to improve as anticipated.  The above assessment and management plan was discussed with the patient. The patient verbalized understanding of and has agreed to the management plan. Patient is aware to call the clinic if symptoms persist or worsen. Patient is aware when to return to the clinic for a follow-up visit. Patient educated on when it is appropriate to go to the emergency department.   Time call ended: 3:16 PM  I provided 11 minutes of non-face-to-face time during this encounter.  [name], MSN, APRN, FNP-C [facility] 01/29/21

## 2021-02-01 ENCOUNTER — Encounter: Payer: Self-pay | Admitting: Family Medicine

## 2021-02-02 ENCOUNTER — Telehealth: Payer: Self-pay

## 2021-02-02 NOTE — Telephone Encounter (Signed)
Ozempic (0.25 or 0.5 MG/DOSE) 2MG /1.5ML pen-injectors  (Key: [id number]) Rx #: [id number]  Sent to plan

## 2021-02-03 NOTE — Telephone Encounter (Signed)
Spoke with pharmacy and no PA needed at this time.

## 2021-02-18 ENCOUNTER — Other Ambulatory Visit: Payer: Self-pay | Admitting: Family Medicine

## 2021-03-03 ENCOUNTER — Ambulatory Visit: Payer: Medicare Other

## 2021-03-03 ENCOUNTER — Ambulatory Visit (INDEPENDENT_AMBULATORY_CARE_PROVIDER_SITE_OTHER): Payer: Medicare Other

## 2021-03-03 VITALS — Ht 75.0 in | Wt 395.0 lb

## 2021-03-03 DIAGNOSIS — Z0000 Encounter for general adult medical examination without abnormal findings: Secondary | ICD-10-CM

## 2021-03-03 NOTE — Progress Notes (Signed)
Subjective:   Richard Crawford is a 65 y.o. male who presents for an Initial Medicare Annual Wellness Visit.  Virtual Visit via Telephone Note  I connected with  Richard Crawford on 03/03/21 at  4:15 PM EDT by telephone and verified that I am speaking with the correct person using two identifiers.  Location: Patient: Home Provider: [facility] Persons participating in the virtual visit: patient/Nurse Health Advisor   I discussed the limitations, risks, security and privacy concerns of performing an evaluation and management service by telephone and the availability of in person appointments. The patient expressed understanding and agreed to proceed.  Interactive audio and video telecommunications were attempted between this nurse and patient, however failed, due to patient having technical difficulties OR patient did not have access to video capability.  We continued and completed visit with audio only.  Some vital signs may be absent or patient reported.   Jelene Albano E Soul Hackman, LPN   Review of Systems     Cardiac Risk Factors include: advanced age (>39men, >4 women);diabetes mellitus;dyslipidemia;hypertension;male gender;obesity (BMI >30kg/m2);sedentary lifestyle     Objective:    Today's Vitals   03/03/21 1635  Weight: (!) 395 lb (179.2 kg)  Height: 6\' 3"  (1.905 m)  PainSc: 3    Body mass index is 49.37 kg/m.  Advanced Directives 03/03/2021 08/05/2019  Does Patient Have a Medical Advance Directive? No No  Would patient like information on creating a medical advance directive? No - Patient declined No - Patient declined    Current Medications (verified) Outpatient Encounter Medications as of 03/03/2021  Medication Sig  . atorvastatin (LIPITOR) 20 MG tablet Take 1 tablet (20 mg total) by mouth daily.  . celecoxib (CELEBREX) 200 MG capsule TAKE  (1)  CAPSULE  TWICE DAILY.  . fluticasone (FLONASE) 50 MCG/ACT nasal spray Place 2 sprays into both nostrils daily.  Marland Kitchen gabapentin (NEURONTIN)  100 MG capsule TAKE 3 CAPSULES THREE TIMES DAILY  . glucose blood test strip Check BS daily and as needed Dx E11.9  . HYDROcodone-acetaminophen (NORCO/VICODIN) 5-325 MG tablet Take 1 tablet by mouth every 6 (six) hours as needed.  Marland Kitchen levothyroxine (SYNTHROID) 200 MCG tablet TAKE 1 TABLET (200 MCG TOTAL) BY MOUTH DAILY BEFORE BREAKFAST (TAKE IN ADDITION TO 50MCG TABLET)  . levothyroxine (SYNTHROID) 50 MCG tablet TAKE 1 TABLET DAILY BEFORE BREAKFAST. (TAKE IN ADDITION TO 200MCG TABLET FOR TOTAL DOSE 250 MCG)  . metFORMIN (GLUCOPHAGE-XR) 750 MG 24 hr tablet 1 tablet daily  . metoprolol tartrate (LOPRESSOR) 100 MG tablet Take 2 tablets (200 mg total) by mouth daily.  . Multiple Vitamin (MULTIVITAMIN) tablet Take 1 tablet by mouth 2 (two) times daily.  . Prodigy Lancets 28G MISC Check BS daily and as needed Dx E11.9  . rivaroxaban (XARELTO) 20 MG TABS tablet TAKE 1 TABLET (20 MG TOTAL) BY MOUTH DAILY WITH SUPPER FOR 30 DAYS.  . Semaglutide,0.25 or 0.5MG /DOS, (OZEMPIC, 0.25 OR 0.5 MG/DOSE,) 2 MG/1.5ML SOPN Inject 0.5 mg into the skin once a week.  . spironolactone (ALDACTONE) 25 MG tablet Take 1 tablet (25 mg total) by mouth daily.  Marland Kitchen testosterone cypionate (DEPOTESTOSTERONE CYPIONATE) 200 MG/ML injection Inject 1 mL (200 mg total) into the muscle every 14 (fourteen) days.  . valsartan (DIOVAN) 80 MG tablet Take 1 tablet (80 mg total) by mouth daily.  . furosemide (LASIX) 80 MG tablet Take 1 tablet (80 mg total) by mouth daily.   No facility-administered encounter medications on file as of 03/03/2021.    Allergies (verified) Morphine  and related   History: Past Medical History:  Diagnosis Date  . Anxiety   . Arthritis    left shoulder  . Atrial fibrillation ([place])   . Cancer ([place]) 1989   thyroid  . Cellulitis   . Chronic heart failure with preserved ejection fraction ([place])   . Diabetes mellitus without complication ([place])   . Hemorrhoids    external  . Hx of colonic polyps   .  Hyperlipidemia   . Hypertension   . 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 Migraine   . Papillary thyroid carcinoma ([place])   . Postoperative hypothyroidism   . Primary osteoarthritis of left shoulder   . Sleep apnea   . Venous stasis dermatitis of both lower extremities    Past Surgical History:  Procedure Laterality Date  . ANKLE FUSION    . KNEE ARTHROSCOPY    . SHOULDER ARTHROSCOPY    . TOTAL SHOULDER REPLACEMENT Right    Family History  Problem Relation Age of Onset  . Hypertension Mother   . Dementia Mother   . Breast cancer Mother   . Stomach cancer Mother   . Ovarian cancer Mother   . Heart disease Father    Social History   Socioeconomic History  . Marital status: Married    Spouse name: Not on file  . Number of children: Not on file  . Years of education: Not on file  . Highest education level: Not on file  Occupational History  . Occupation: retired  Tobacco Use  . Smoking status: Never Smoker  . Smokeless tobacco: Never Used  Substance and Sexual Activity  . Alcohol use: Not Currently  . Drug use: Never  . Sexual activity: Not on file  Other Topics Concern  . Not on file  Social History Narrative   Lives at home with wife - Originally from [place]; moved here so wife could be closer to her grandchildren (second wife)   Social Determinants of Health   Financial Resource Strain: Low Risk   . Difficulty of Paying Living Expenses: Not hard at all  Food Insecurity: No Food Insecurity  . Worried About Charity fundraiser in the Last Year: Never true  . Ran Out of Food in the Last Year: Never true  Transportation Needs: No Transportation Needs  . Lack of Transportation (Medical): No  . Lack of Transportation (Non-Medical): No  Physical Activity: Insufficiently Active  . Days of Exercise per Week: 3 days  . Minutes of Exercise per Session: 30 min  Stress: No Stress Concern Present  . Feeling of Stress : Not at all  Social Connections: Moderately Isolated   . Frequency of Communication with Friends and Family: Three times a week  . Frequency of Social Gatherings with Friends and Family: Once a week  . Attends Religious Services: Never  . Active Member of Clubs or Organizations: No  . Attends Archivist Meetings: Never  . Marital Status: Married    Tobacco Counseling Counseling given: Not Answered   Clinical Intake:  Pre-visit preparation completed: Yes  Pain : 0-10 Pain Score: 3  Pain Type: Chronic pain Pain Location: Generalized Pain Descriptors / Indicators: Aching,Sore Pain Onset: More than a month ago Pain Frequency: Intermittent     BMI - recorded: 49.37 Nutritional Status: BMI > 30  Obese Nutritional Risks: None Diabetes: Yes CBG done?: No Did pt. bring in CBG monitor from home?: No  How often do you need to have someone help you when you read  instructions, pamphlets, or other written materials from your doctor or pharmacy?: 1 - Never  Nutrition Risk Assessment:  Has the patient had any N/V/D within the last 2 months?  No  Does the patient have any non-healing wounds?  No  Has the patient had any unintentional weight loss or weight gain?  Yes   Diabetes:  Is the patient diabetic?  Yes  If diabetic, was a CBG obtained today?  No  Did the patient bring in their glucometer from home?  No  How often do you monitor your CBG's? Once daily fasting - 103 this am per patient.   Financial Strains and Diabetes Management:  Are you having any financial strains with the device, your supplies or your medication? No .  Does the patient want to be seen by Chronic Care Management for management of their diabetes?  No  Would the patient like to be referred to a Nutritionist or for Diabetic Management?  No   Diabetic Exams:  Diabetic Eye Exam: Completed 09/17/2020.   Diabetic Foot Exam: Completed 01/30/2020. Pt has been advised about the importance in completing this exam. Pt is scheduled for diabetic foot exam on  03/12/21.    Interpreter Needed?: No  Information entered by :: Chyenne Sobczak, LPN   Activities of Daily Living In your present state of health, do you have any difficulty performing the following activities: 03/03/2021  Hearing? N  Vision? N  Difficulty concentrating or making decisions? N  Walking or climbing stairs? Y  Dressing or bathing? N  Doing errands, shopping? N  Preparing Food and eating ? N  Using the Toilet? N  In the past six months, have you accidently leaked urine? N  Do you have problems with loss of bowel control? N  Managing your Medications? N  Managing your Finances? N  Housekeeping or managing your Housekeeping? N  Some recent data might be hidden    Patient Care Team: Loman Brooklyn, FNP as PCP - General (Family Medicine) Minus Breeding, MD as PCP - Cardiology (Cardiology)  Indicate any recent Medical Services you may have received from other than [facility] providers in the past year (date may be approximate).     Assessment:   This is a routine wellness examination for Alrick.  Hearing/Vision screen  Hearing Screening   125Hz  250Hz  500Hz  1000Hz  2000Hz  3000Hz  4000Hz  6000Hz  8000Hz   Right ear:           Left ear:           Comments: Denies hearing difficulties  Vision Screening Comments: Wears eyeglasses - Routine visits with Lurena Joiner in [place] - up to date with eye exam  Dietary issues and exercise activities discussed: Current Exercise Habits: The patient does not participate in regular exercise at present, Type of exercise: Other - see comments (swimming), Time (Minutes): 30, Frequency (Times/Week): 3, Weekly Exercise (Minutes/Week): 90, Intensity: Mild, Exercise limited by: neurologic condition(s);orthopedic condition(s)  Goals Addressed            This Visit's Progress   . Weight loss       Work on healthy, low fat, high protein diet - 2 lbs per week is healthy - Drink 6-8 glasses of water daily - Try to get at least 30 minutes of  physical activity in each day      Depression Screen PHQ 2/9 Scores 03/03/2021 12/11/2020 11/26/2020 08/26/2020 05/05/2020 01/30/2020 12/12/2019  PHQ - 2 Score 0 0 0 0 0 0 0  PHQ- 9 Score - - - - - -  0    Fall Risk Fall Risk  03/03/2021 12/11/2020 12/11/2020 11/26/2020 08/26/2020  Falls in the past year? 1 1 0 0 0  Number falls in past yr: 0 1 - - -  Injury with Fall? 0 0 - - -  Risk for fall due to : History of fall(s);Impaired vision;Medication side effect;Impaired balance/gait;Orthopedic patient History of fall(s);Impaired balance/gait - - -  Follow up Education provided;Falls prevention discussed Falls evaluation completed - - -    FALL RISK PREVENTION PERTAINING TO THE HOME:  Any stairs in or around the home? Yes  If so, are there any without handrails? No  Home free of loose throw rugs in walkways, pet beds, electrical cords, etc? Yes  Adequate lighting in your home to reduce risk of falls? Yes   ASSISTIVE DEVICES UTILIZED TO PREVENT FALLS:  Life alert? No  Use of a cane, walker or w/c? Yes  Grab bars in the bathroom? Yes  Shower chair or bench in shower? Yes  Elevated toilet seat or a handicapped toilet? Yes   TIMED UP AND GO:  Was the test performed? No . Telephonic visit   Cognitive Function:     6CIT Screen 03/03/2021  What Year? 0 points  What month? 0 points  What time? 0 points  Count back from 20 0 points  Months in reverse 0 points  Repeat phrase 0 points  Total Score 0    Immunizations  There is no immunization history on file for this patient.  TDAP status: Due, Education has been provided regarding the importance of this vaccine. Advised may receive this vaccine at local pharmacy or Health Dept. Aware to provide a copy of the vaccination record if obtained from local pharmacy or Health Dept. Verbalized acceptance and understanding.  Flu Vaccine status: Declined, Education has been provided regarding the importance of this vaccine but patient still declined.  Advised may receive this vaccine at local pharmacy or Health Dept. Aware to provide a copy of the vaccination record if obtained from local pharmacy or Health Dept. Verbalized acceptance and understanding.  Pneumococcal vaccine status: Declined,  Education has been provided regarding the importance of this vaccine but patient still declined. Advised may receive this vaccine at local pharmacy or Health Dept. Aware to provide a copy of the vaccination record if obtained from local pharmacy or Health Dept. Verbalized acceptance and understanding.   Covid-19 vaccine status: Declined, Education has been provided regarding the importance of this vaccine but patient still declined. Advised may receive this vaccine at local pharmacy or Health Dept.or vaccine clinic. Aware to provide a copy of the vaccination record if obtained from local pharmacy or Health Dept. Verbalized acceptance and understanding.  Qualifies for Shingles Vaccine? Yes   Zostavax completed No   Shingrix Completed?: No.    Education has been provided regarding the importance of this vaccine. Patient has been advised to call insurance company to determine out of pocket expense if they have not yet received this vaccine. Advised may also receive vaccine at local pharmacy or Health Dept. Verbalized acceptance and understanding.  Screening Tests Health Maintenance  Topic Date Due  . TETANUS/TDAP  Never done  . FOOT EXAM  01/29/2021  . Hepatitis C Screening  11/26/2021 (Originally 09/22/1974)  . PNA vac Low Risk Adult (1 of 2 - PCV13) 11/26/2021 (Originally 09/23/2019)  . INFLUENZA VACCINE  05/11/2021  . HEMOGLOBIN A1C  05/26/2021  . OPHTHALMOLOGY EXAM  09/17/2021  . COLONOSCOPY (Pts 45-42yrs Insurance coverage will need  to be confirmed)  05/20/2023  . HPV VACCINES  Aged Out  . COVID-19 Vaccine  Discontinued    Health Maintenance  Health Maintenance Due  Topic Date Due  . TETANUS/TDAP  Never done  . FOOT EXAM  01/29/2021     Colorectal cancer screening: Type of screening: Colonoscopy. Completed 05/19/2018. Repeat every 5 years  Lung Cancer Screening: (Low Dose CT Chest recommended if Age 15-80 years, 30 pack-year currently smoking OR have quit w/in 15years.) does not qualify.   Additional Screening:  Hepatitis C Screening: does qualify; Needs drawn at next visit  Vision Screening: Recommended annual ophthalmology exams for early detection of glaucoma and other disorders of the eye. Is the patient up to date with their annual eye exam?  Yes  Who is the provider or what is the name of the office in which the patient attends annual eye exams? [facility] in [place] If pt is not established with a provider, would they like to be referred to a provider to establish care? No .   Dental Screening: Recommended annual dental exams for proper oral hygiene  Community Resource Referral / Chronic Care Management: CRR required this visit?  No   CCM required this visit?  No      Plan:     I have personally reviewed and noted the following in the patient's chart:   . Medical and social history . Use of alcohol, tobacco or illicit drugs  . Current medications and supplements including opioid prescriptions. Patient is not currently taking opioid prescriptions. . Functional ability and status . Nutritional status . Physical activity . Advanced directives . List of other physicians . Hospitalizations, surgeries, and ER visits in previous 12 months . Vitals . Screenings to include cognitive, depression, and falls . Referrals and appointments  In addition, I have reviewed and discussed with patient certain preventive protocols, quality metrics, and best practice recommendations. A written personalized care plan for preventive services as well as general preventive health recommendations were provided to patient.     Sandrea Hammond, LPN   [id number]   Nurse Notes: None

## 2021-03-03 NOTE — Patient Instructions (Signed)
Richard Crawford , Thank you for taking time to come for your Medicare Wellness Visit. I appreciate your ongoing commitment to your health goals. Please review the following plan we discussed and let me know if I can assist you in the future.   Screening recommendations/referrals: Colonoscopy: Done 05/19/2018 - Repeat every 5 years Recommended yearly ophthalmology/optometry visit for glaucoma screening and checkup Recommended yearly dental visit for hygiene and checkup  Vaccinations: patient declines all vaccines at this time Influenza vaccine:  Pneumococcal vaccine:  Tdap vaccine:  Shingles vaccine:    Covid-19:   Advanced directives: Advance directive discussed with you today. Even though you declined this today, please call our office should you change your mind, and we can give you the proper paperwork for you to fill out.  Conditions/risks identified: Aim for 30 minutes of exercise or brisk walking each day, drink 6-8 glasses of water and eat lots of fruits and vegetables.  Next appointment: Follow up in one year for your annual wellness visit.   Preventive Care 83 Years and Older, Male  Preventive care refers to lifestyle choices and visits with your health care provider that can promote health and wellness. What does preventive care include?  A yearly physical exam. This is also called an annual well check.  Dental exams once or twice a year.  Routine eye exams. Ask your health care provider how often you should have your eyes checked.  Personal lifestyle choices, including:  Daily care of your teeth and gums.  Regular physical activity.  Eating a healthy diet.  Avoiding tobacco and drug use.  Limiting alcohol use.  Practicing safe sex.  Taking low doses of aspirin every day.  Taking vitamin and mineral supplements as recommended by your health care provider. What happens during an annual well check? The services and screenings done by your health care provider during  your annual well check will depend on your age, overall health, lifestyle risk factors, and family history of disease. Counseling  Your health care provider may ask you questions about your:  Alcohol use.  Tobacco use.  Drug use.  Emotional well-being.  Home and relationship well-being.  Sexual activity.  Eating habits.  History of falls.  Memory and ability to understand (cognition).  Work and work Statistician. Screening  You may have the following tests or measurements:  Height, weight, and BMI.  Blood pressure.  Lipid and cholesterol levels. These may be checked every 5 years, or more frequently if you are over 75 years old.  Skin check.  Lung cancer screening. You may have this screening every year starting at age 32 if you have a 30-pack-year history of smoking and currently smoke or have quit within the past 15 years.  Fecal occult blood test (FOBT) of the stool. You may have this test every year starting at age 23.  Flexible sigmoidoscopy or colonoscopy. You may have a sigmoidoscopy every 5 years or a colonoscopy every 10 years starting at age 70.  Prostate cancer screening. Recommendations will vary depending on your family history and other risks.  Hepatitis C blood test.  Hepatitis B blood test.  Sexually transmitted disease (STD) testing.  Diabetes screening. This is done by checking your blood sugar (glucose) after you have not eaten for a while (fasting). You may have this done every 1-3 years.  Abdominal aortic aneurysm (AAA) screening. You may need this if you are a current or former smoker.  Osteoporosis. You may be screened starting at age 40 if  you are at high risk. Talk with your health care provider about your test results, treatment options, and if necessary, the need for more tests. Vaccines  Your health care provider may recommend certain vaccines, such as:  Influenza vaccine. This is recommended every year.  Tetanus, diphtheria, and  acellular pertussis (Tdap, Td) vaccine. You may need a Td booster every 10 years.  Zoster vaccine. You may need this after age 10.  Pneumococcal 13-valent conjugate (PCV13) vaccine. One dose is recommended after age 65.  Pneumococcal polysaccharide (PPSV23) vaccine. One dose is recommended after age 20. Talk to your health care provider about which screenings and vaccines you need and how often you need them. This information is not intended to replace advice given to you by your health care provider. Make sure you discuss any questions you have with your health care provider. Document Released: 10/24/2015 Document Revised: 06/16/2016 Document Reviewed: 07/29/2015 Elsevier Interactive Patient Education  2017 [place] Prevention in the Home Falls can cause injuries. They can happen to people of all ages. There are many things you can do to make your home safe and to help prevent falls. What can I do on the outside of my home?  Regularly fix the edges of walkways and driveways and fix any cracks.  Remove anything that might make you trip as you walk through a door, such as a raised step or threshold.  Trim any bushes or trees on the path to your home.  Use bright outdoor lighting.  Clear any walking paths of anything that might make someone trip, such as rocks or tools.  Regularly check to see if handrails are loose or broken. Make sure that both sides of any steps have handrails.  Any raised decks and porches should have guardrails on the edges.  Have any leaves, snow, or ice cleared regularly.  Use sand or salt on walking paths during winter.  Clean up any spills in your garage right away. This includes oil or grease spills. What can I do in the bathroom?  Use night lights.  Install grab bars by the toilet and in the tub and shower. Do not use towel bars as grab bars.  Use non-skid mats or decals in the tub or shower.  If you need to sit down in the shower, use  a plastic, non-slip stool.  Keep the floor dry. Clean up any water that spills on the floor as soon as it happens.  Remove soap buildup in the tub or shower regularly.  Attach bath mats securely with double-sided non-slip rug tape.  Do not have throw rugs and other things on the floor that can make you trip. What can I do in the bedroom?  Use night lights.  Make sure that you have a light by your bed that is easy to reach.  Do not use any sheets or blankets that are too big for your bed. They should not hang down onto the floor.  Have a firm chair that has side arms. You can use this for support while you get dressed.  Do not have throw rugs and other things on the floor that can make you trip. What can I do in the kitchen?  Clean up any spills right away.  Avoid walking on wet floors.  Keep items that you use a lot in easy-to-reach places.  If you need to reach something above you, use a strong step stool that has a grab bar.  Keep electrical cords  out of the way.  Do not use floor polish or wax that makes floors slippery. If you must use wax, use non-skid floor wax.  Do not have throw rugs and other things on the floor that can make you trip. What can I do with my stairs?  Do not leave any items on the stairs.  Make sure that there are handrails on both sides of the stairs and use them. Fix handrails that are broken or loose. Make sure that handrails are as long as the stairways.  Check any carpeting to make sure that it is firmly attached to the stairs. Fix any carpet that is loose or worn.  Avoid having throw rugs at the top or bottom of the stairs. If you do have throw rugs, attach them to the floor with carpet tape.  Make sure that you have a light switch at the top of the stairs and the bottom of the stairs. If you do not have them, ask someone to add them for you. What else can I do to help prevent falls?  Wear shoes that:  Do not have high heels.  Have  rubber bottoms.  Are comfortable and fit you well.  Are closed at the toe. Do not wear sandals.  If you use a stepladder:  Make sure that it is fully opened. Do not climb a closed stepladder.  Make sure that both sides of the stepladder are locked into place.  Ask someone to hold it for you, if possible.  Clearly mark and make sure that you can see:  Any grab bars or handrails.  First and last steps.  Where the edge of each step is.  Use tools that help you move around (mobility aids) if they are needed. These include:  Canes.  Walkers.  Scooters.  Crutches.  Turn on the lights when you go into a dark area. Replace any light bulbs as soon as they burn out.  Set up your furniture so you have a clear path. Avoid moving your furniture around.  If any of your floors are uneven, fix them.  If there are any pets around you, be aware of where they are.  Review your medicines with your doctor. Some medicines can make you feel dizzy. This can increase your chance of falling. Ask your doctor what other things that you can do to help prevent falls. This information is not intended to replace advice given to you by your health care provider. Make sure you discuss any questions you have with your health care provider. Document Released: 07/24/2009 Document Revised: 03/04/2016 Document Reviewed: 11/01/2014 Elsevier Interactive Patient Education  2017 [employer].

## 2021-03-11 ENCOUNTER — Other Ambulatory Visit: Payer: Self-pay | Admitting: Family Medicine

## 2021-03-11 DIAGNOSIS — I5032 Chronic diastolic (congestive) heart failure: Secondary | ICD-10-CM

## 2021-03-12 ENCOUNTER — Encounter: Payer: Self-pay | Admitting: Family Medicine

## 2021-03-12 ENCOUNTER — Ambulatory Visit: Payer: Medicare Other | Admitting: Family Medicine

## 2021-03-12 ENCOUNTER — Other Ambulatory Visit: Payer: Self-pay

## 2021-03-12 VITALS — BP 117/64 | HR 105 | Temp 98.6°F | Resp 20 | Ht 75.0 in | Wt 391.0 lb

## 2021-03-12 DIAGNOSIS — I4821 Permanent atrial fibrillation: Secondary | ICD-10-CM

## 2021-03-12 DIAGNOSIS — Z6841 Body Mass Index (BMI) 40.0 and over, adult: Secondary | ICD-10-CM

## 2021-03-12 DIAGNOSIS — E1169 Type 2 diabetes mellitus with other specified complication: Secondary | ICD-10-CM

## 2021-03-12 DIAGNOSIS — T4995XA Adverse effect of unspecified topical agent, initial encounter: Secondary | ICD-10-CM

## 2021-03-12 DIAGNOSIS — R7989 Other specified abnormal findings of blood chemistry: Secondary | ICD-10-CM | POA: Diagnosis not present

## 2021-03-12 DIAGNOSIS — E1141 Type 2 diabetes mellitus with diabetic mononeuropathy: Secondary | ICD-10-CM | POA: Diagnosis not present

## 2021-03-12 DIAGNOSIS — I152 Hypertension secondary to endocrine disorders: Secondary | ICD-10-CM

## 2021-03-12 DIAGNOSIS — E1159 Type 2 diabetes mellitus with other circulatory complications: Secondary | ICD-10-CM | POA: Diagnosis not present

## 2021-03-12 DIAGNOSIS — G4733 Obstructive sleep apnea (adult) (pediatric): Secondary | ICD-10-CM

## 2021-03-12 DIAGNOSIS — E785 Hyperlipidemia, unspecified: Secondary | ICD-10-CM

## 2021-03-12 DIAGNOSIS — R238 Other skin changes: Secondary | ICD-10-CM

## 2021-03-12 DIAGNOSIS — E890 Postprocedural hypothyroidism: Secondary | ICD-10-CM

## 2021-03-12 DIAGNOSIS — I5032 Chronic diastolic (congestive) heart failure: Secondary | ICD-10-CM

## 2021-03-12 DIAGNOSIS — E119 Type 2 diabetes mellitus without complications: Secondary | ICD-10-CM

## 2021-03-12 LAB — BAYER DCA HB A1C WAIVED: HB A1C (BAYER DCA - WAIVED): 6.5 % (ref <7.0)

## 2021-03-12 MED ORDER — OZEMPIC (0.25 OR 0.5 MG/DOSE) 2 MG/1.5ML ~~LOC~~ SOPN: 0.5000 mg | PEN_INJECTOR | SUBCUTANEOUS | 1 refills | Status: DC

## 2021-03-12 MED ORDER — OZEMPIC (1 MG/DOSE) 2 MG/1.5ML ~~LOC~~ SOPN: 1.0000 mg | PEN_INJECTOR | SUBCUTANEOUS | 5 refills | Status: DC

## 2021-03-12 MED ORDER — SPIRONOLACTONE 25 MG PO TABS: 1.0000 | ORAL_TABLET | Freq: Every day | ORAL | 1 refills | Status: DC

## 2021-03-12 MED ORDER — FUROSEMIDE 80 MG PO TABS
80.0000 mg | ORAL_TABLET | Freq: Every day | ORAL | 1 refills | Status: DC
Start: 2021-03-12 — End: 2021-08-05

## 2021-03-12 MED ORDER — PREDNISONE 10 MG (21) PO TBPK: ORAL_TABLET | ORAL | 0 refills | Status: DC

## 2021-03-12 MED ORDER — VALSARTAN 80 MG PO TABS: 80.0000 mg | ORAL_TABLET | Freq: Every day | ORAL | 3 refills | Status: DC

## 2021-03-12 NOTE — Progress Notes (Signed)
Assessment & Plan:  1. Type 2 diabetes mellitus with diabetic mononeuropathy, without long-term current use of insulin (HCC) Lab Results  Component Value Date   HGBA1C 6.5 03/12/2021   HGBA1C 6.8 11/26/2020   HGBA1C 6.9 08/26/2020    - Diabetes is at goal of A1c < 7. - Medications:  increase Ozempic from 0.5 mg to 1 mg once weekly. - Patient is currently taking a statin. Patient is taking an ACE-inhibitor/ARB.   Diabetes Health Maintenance Due  Topic Date Due   HEMOGLOBIN A1C  09/11/2021   OPHTHALMOLOGY EXAM  09/17/2021   FOOT EXAM  03/12/2022    - CBC with Differential/Platelet - CMP14+EGFR - Lipid panel - Bayer DCA Hb A1c Waived - Semaglutide, 1 MG/DOSE, (OZEMPIC, 1 MG/DOSE,) 2 MG/1.5ML SOPN; Inject 1 mg into the skin once a week.  Dispense: 3 mL; Refill: 5  2. Morbid obesity with BMI of 50.0-59.9, adult (HCC) Continue diet and exercise. Ozempic increased from 0.5 mg to 1 mg once weekly. - Semaglutide, 1 MG/DOSE, (OZEMPIC, 1 MG/DOSE,) 2 MG/1.5ML SOPN; Inject 1 mg into the skin once a week.  Dispense: 3 mL; Refill: 5  3. Low testosterone Encouraged to start testosterone injections as prescribed.  4. Hypertension associated with diabete.bjs ([place]) Well controlled on current regimen. Managed by cardiology. - CBC with Differential/Platelet - CMP14+EGFR - furosemide (LASIX) 80 MG tablet; Take 1 tablet (80 mg total) by mouth daily.  Dispense: 90 tablet; Refill: 1 - valsartan (DIOVAN) 80 MG tablet; Take 1 tablet (80 mg total) by mouth daily.  Dispense: 90 tablet; Refill: 3  5. Hyperlipidemia associated with type 2 diabetes mellitus ([place]) Labs to assess. - CMP14+EGFR - Lipid panel  6. Chronic heart failure with preserved ejection fraction (HCC) Well controlled on current regimen. Managed by cardiology. - CMP14+EGFR - furosemide (LASIX) 80 MG tablet; Take 1 tablet (80 mg total) by mouth daily.  Dispense: 90 tablet; Refill: 1 - spironolactone (ALDACTONE) 25 MG tablet; Take 1  tablet (25 mg total) by mouth daily.  Dispense: 90 tablet; Refill: 1 - valsartan (DIOVAN) 80 MG tablet; Take 1 tablet (80 mg total) by mouth daily.  Dispense: 90 tablet; Refill: 3  7. Permanent atrial fibrillation (HCC) Well controlled on current regimen. Managed by cardiology. - CBC with Differential/Platelet  8. Postoperative hypothyroidism Managed by endocrinology.  9. Obstructive sleep apnea Continue CPAP nightly.  10. Skin irritation due to topical agent Advised if area worsens to let me know.  - predniSONE (STERAPRED UNI-PAK 21 TAB) 10 MG (21) TBPK tablet; As directed x 6 days  Dispense: 21 tablet; Refill: 0   Return in about 4 months (around 07/12/2021) for annual physical.  [name], MSN, APRN, FNP-C [facility]  Subjective:    Patient ID: Richard Crawford, male    DOB: Sep 05, 1956, 65 y.o.   MRN: [id number]  Patient Care Team: Loman Brooklyn, FNP as PCP - General (Family Medicine) Minus Breeding, MD as PCP - Cardiology (Cardiology)   Chief Complaint:  Chief Complaint  Patient presents with   Medical Management of Chronic Issues    3 mo follow up     HPI: Richard Crawford is a 65 y.o. male presenting on 03/12/2021 for Medical Management of Chronic Issues (3 mo follow up )  Morbid Obesity: tolerating Ozempic well. Currently using 0.5 mg once weekly. Is losing some weight. He is trying to eat healthy and exercise.   Low testosterone: related complaints of fatigue and decreased libido. He hasn't started  the testosterone injections yet.   Hypertension: Sees cardiology.   Heart Failure: taking furosemide, spironolactone, and valsartan. Improvement in edema with the addition of spironolactone. Sees cardiology.   Atrial-Fibrillation: taking Xarelto. No bleeding. Sees cardiology.   Hyperlipidemia: tolerating Atorvastatin.   Diabetes: Current symptoms include: paresthesia of the feet. Known diabetic complications: peripheral neuropathy and  cardiovascular disease. Medication compliance: yes. Current diet: in general, a "healthy" diet  . Current exercise:  some . Home blood sugar records:  not discussed this visit . Is he  on ACE inhibitor or angiotensin II receptor blocker? Yes. Is he on a statin? Yes.   Neuropathy: patient was previously taking 300 mg of gabapentin at bedtime. He is hoping to increase this throughout the day to better control his pain.   Hypothyroidism: managed by endocrinologist, Dr. Buddy Duty.   Sleep apnea: wears CPAP nightly.   New complaints: Patient complains of skin irritation to both armpits. It started after he started using a new spray on deodorant. He reports his skin felt like it was burning. Reports some improvement.    Social history:  Relevant past medical, surgical, family and social history reviewed and updated as indicated. Interim medical history since our last visit reviewed.  Allergies and medications reviewed and updated.  DATA REVIEWED: CHART IN EPIC  ROS: Negative unless specifically indicated above in HPI.    Current Outpatient Medications:    atorvastatin (LIPITOR) 20 MG tablet, Take 1 tablet (20 mg total) by mouth daily., Disp: 90 tablet, Rfl: 1   celecoxib (CELEBREX) 200 MG capsule, TAKE  (1)  CAPSULE  TWICE DAILY., Disp: 180 capsule, Rfl: 2   furosemide (LASIX) 80 MG tablet, Take 1 tablet (80 mg total) by mouth daily., Disp: 90 tablet, Rfl: 1   gabapentin (NEURONTIN) 100 MG capsule, TAKE 3 CAPSULES THREE TIMES DAILY, Disp: 270 capsule, Rfl: 3   glucose blood test strip, Check BS daily and as needed Dx E11.9, Disp: 100 each, Rfl: 3   HYDROcodone-acetaminophen (NORCO/VICODIN) 5-325 MG tablet, Take 1 tablet by mouth every 6 (six) hours as needed., Disp: , Rfl:    levothyroxine (SYNTHROID) 200 MCG tablet, TAKE 1 TABLET (200 MCG TOTAL) BY MOUTH DAILY BEFORE BREAKFAST (TAKE IN ADDITION TO 50MCG TABLET), Disp: 30 tablet, Rfl: 0   levothyroxine (SYNTHROID) 50 MCG tablet, TAKE 1 TABLET  DAILY BEFORE BREAKFAST. (TAKE IN ADDITION TO 200MCG TABLET FOR TOTAL DOSE 250 MCG), Disp: 30 tablet, Rfl: 0   metFORMIN (GLUCOPHAGE-XR) 750 MG 24 hr tablet, 1 tablet daily, Disp: 90 tablet, Rfl: 1   metoprolol tartrate (LOPRESSOR) 100 MG tablet, Take 2 tablets (200 mg total) by mouth daily., Disp: 180 tablet, Rfl: 1   Multiple Vitamin (MULTIVITAMIN) tablet, Take 1 tablet by mouth 2 (two) times daily., Disp: , Rfl:    Prodigy Lancets 28G MISC, Check BS daily and as needed Dx E11.9, Disp: 100 each, Rfl: 3   rivaroxaban (XARELTO) 20 MG TABS tablet, TAKE 1 TABLET (20 MG TOTAL) BY MOUTH DAILY WITH SUPPER FOR 30 DAYS., Disp: 90 tablet, Rfl: 1   Semaglutide,0.25 or 0.5MG /DOS, (OZEMPIC, 0.25 OR 0.5 MG/DOSE,) 2 MG/1.5ML SOPN, Inject 0.5 mg into the skin once a week., Disp: 3 mL, Rfl: 1   spironolactone (ALDACTONE) 25 MG tablet, TAKE 1 TABLET DAILY, Disp: 30 tablet, Rfl: 0   valsartan (DIOVAN) 80 MG tablet, Take 1 tablet (80 mg total) by mouth daily., Disp: 90 tablet, Rfl: 3   testosterone cypionate (DEPOTESTOSTERONE CYPIONATE) 200 MG/ML injection, Inject 1 mL (200  mg total) into the muscle every 14 (fourteen) days. (Patient not taking: Reported on 03/12/2021), Disp: 10 mL, Rfl: 1   Allergies  Allergen Reactions   Morphine And Related Nausea And Vomiting   Past Medical History:  Diagnosis Date   Anxiety    Arthritis    left shoulder   Atrial fibrillation (HCC)    Cancer ([place]) 1989   thyroid   Chronic heart failure with preserved ejection fraction (HCC)    Diabetes mellitus without complication (HCC)    Hemorrhoids    external   Hx of colonic polyps    Hyperlipidemia    Hypertension    Low testosterone 12/14/2020   Migraine    Papillary thyroid carcinoma (HCC)    Postoperative hypothyroidism    Primary osteoarthritis of left shoulder    Sleep apnea    Venous stasis dermatitis of both lower extremities     Past Surgical History:  Procedure Laterality Date   ANKLE FUSION     KNEE  ARTHROSCOPY     SHOULDER ARTHROSCOPY     TOTAL SHOULDER REPLACEMENT Right     Social History   Socioeconomic History   Marital status: Married    Spouse name: Not on file   Number of children: Not on file   Years of education: Not on file   Highest education level: Not on file  Occupational History   Occupation: retired  Tobacco Use   Smoking status: Never Smoker   Smokeless tobacco: Never Used  Substance and Sexual Activity   Alcohol use: Not Currently   Drug use: Never   Sexual activity: Not on file  Other Topics Concern   Not on file  Social History Narrative   Lives at home with wife - Originally from [place]; moved here so wife could be closer to her grandchildren (second wife)   Social Determinants of Radio broadcast assistant Strain: Low Risk    Difficulty of Paying Living Expenses: Not hard at all  Food Insecurity: No Food Insecurity   Worried About Charity fundraiser in the Last Year: Never true   Arboriculturist in the Last Year: Never true  Transportation Needs: No Transportation Needs   Lack of Transportation (Medical): No   Lack of Transportation (Non-Medical): No  Physical Activity: Insufficiently Active   Days of Exercise per Week: 3 days   Minutes of Exercise per Session: 30 min  Stress: No Stress Concern Present   Feeling of Stress : Not at all  Social Connections: Moderately Isolated   Frequency of Communication with Friends and Family: Three times a week   Frequency of Social Gatherings with Friends and Family: Once a week   Attends Religious Services: Never   Marine scientist or Organizations: No   Attends Music therapist: Never   Marital Status: Married  Human resources officer Violence: Not At Risk   Fear of Current or Ex-Partner: No   Emotionally Abused: No   Physically Abused: No   Sexually Abused: No        Objective:    BP 117/64   Pulse (!) 105   Temp 98.6 F (37 C)   Resp 20   Ht $R'6\' 3"'CA$  (1.905 m)   Wt (!)  391 lb (177.4 kg)   SpO2 97%   BMI 48.87 kg/m   Wt Readings from Last 3 Encounters:  03/12/21 (!) 391 lb (177.4 kg)  03/03/21 (!) 395 lb (179.2 kg)  12/11/20 (!) 387  lb 3.2 oz (175.6 kg)    Physical Exam Vitals reviewed.  Constitutional:      General: He is not in acute distress.    Appearance: Normal appearance. He is not ill-appearing, toxic-appearing or diaphoretic.  HENT:     Head: Normocephalic and atraumatic.  Eyes:     General: No scleral icterus.       Right eye: No discharge.        Left eye: No discharge.     Conjunctiva/sclera: Conjunctivae normal.  Cardiovascular:     Rate and Rhythm: Normal rate and regular rhythm.     Heart sounds: Normal heart sounds. No murmur heard.   No friction rub. No gallop.  Pulmonary:     Effort: Pulmonary effort is normal. No respiratory distress.     Breath sounds: Normal breath sounds. No stridor. No wheezing, rhonchi or rales.  Musculoskeletal:        General: Normal range of motion.     Cervical back: Normal range of motion.  Skin:    General: Skin is warm and dry.     Comments: Under both axilla - deep erythema with scabs on the outside area of the erythema.  Neurological:     Mental Status: He is alert and oriented to person, place, and time. Mental status is at baseline.  Psychiatric:        Mood and Affect: Mood normal.        Behavior: Behavior normal.        Thought Content: Thought content normal.        Judgment: Judgment normal.    Lab Results  Component Value Date   TSH 1.380 08/26/2020   Lab Results  Component Value Date   WBC 8.3 11/26/2020   HGB 13.2 11/26/2020   HCT 39.9 11/26/2020   MCV 86 11/26/2020   PLT 259 11/26/2020   Lab Results  Component Value Date   NA 141 12/11/2020   K 4.5 12/11/2020   CO2 23 12/11/2020   GLUCOSE 158 (H) 12/11/2020   BUN 20 12/11/2020   CREATININE 1.06 12/11/2020   BILITOT 0.6 12/11/2020   ALKPHOS 128 (H) 12/11/2020   AST 25 12/11/2020   ALT 21 12/11/2020   PROT  7.9 12/11/2020   ALBUMIN 4.3 12/11/2020   CALCIUM 9.5 12/11/2020   ANIONGAP 9 08/05/2019   EGFR 77 12/11/2020   Lab Results  Component Value Date   CHOL 99 (L) 11/26/2020   Lab Results  Component Value Date   HDL 34 (L) 11/26/2020   Lab Results  Component Value Date   LDLCALC 41 11/26/2020   Lab Results  Component Value Date   TRIG 133 11/26/2020   Lab Results  Component Value Date   CHOLHDL 2.9 11/26/2020   Lab Results  Component Value Date   HGBA1C 6.8 11/26/2020

## 2021-03-13 ENCOUNTER — Telehealth: Payer: Self-pay | Admitting: *Deleted

## 2021-03-13 LAB — CMP14+EGFR
ALT: 22 IU/L (ref 0–44)
AST: 24 IU/L (ref 0–40)
Albumin/Globulin Ratio: 1.3 (ref 1.2–2.2)
Albumin: 4.5 g/dL (ref 3.8–4.8)
Alkaline Phosphatase: 107 IU/L (ref 44–121)
BUN/Creatinine Ratio: 17 (ref 10–24)
BUN: 20 mg/dL (ref 8–27)
Bilirubin Total: 0.6 mg/dL (ref 0.0–1.2)
CO2: 26 mmol/L (ref 20–29)
Calcium: 9.4 mg/dL (ref 8.6–10.2)
Chloride: 101 mmol/L (ref 96–106)
Creatinine, Ser: 1.2 mg/dL (ref 0.76–1.27)
Globulin, Total: 3.4 g/dL (ref 1.5–4.5)
Glucose: 157 mg/dL — ABNORMAL HIGH (ref 65–99)
Potassium: 4.4 mmol/L (ref 3.5–5.2)
Sodium: 140 mmol/L (ref 134–144)
Total Protein: 7.9 g/dL (ref 6.0–8.5)
eGFR: 67 mL/min/{1.73_m2} (ref >59)

## 2021-03-13 LAB — LIPID PANEL
Chol/HDL Ratio: 2.8 ratio (ref 0.0–5.0)
Cholesterol, Total: 88 mg/dL — ABNORMAL LOW (ref 100–199)
HDL: 32 mg/dL — ABNORMAL LOW (ref >39)
LDL Chol Calc (NIH): 33 mg/dL (ref 0–99)
Triglycerides: 127 mg/dL (ref 0–149)
VLDL Cholesterol Cal: 23 mg/dL (ref 5–40)

## 2021-03-13 LAB — CBC WITH DIFFERENTIAL/PLATELET
Basophils Absolute: 0.1 10*3/uL (ref 0.0–0.2)
Basos: 1 %
EOS (ABSOLUTE): 0.2 10*3/uL (ref 0.0–0.4)
Eos: 3 %
Hematocrit: 39.3 % (ref 37.5–51.0)
Hemoglobin: 13.1 g/dL (ref 13.0–17.7)
Immature Grans (Abs): 0 10*3/uL (ref 0.0–0.1)
Immature Granulocytes: 0 %
Lymphocytes Absolute: 3.1 10*3/uL (ref 0.7–3.1)
Lymphs: 38 %
MCH: 29.2 pg (ref 26.6–33.0)
MCHC: 33.3 g/dL (ref 31.5–35.7)
MCV: 88 fL (ref 79–97)
Monocytes Absolute: 1 10*3/uL — ABNORMAL HIGH (ref 0.1–0.9)
Monocytes: 13 %
Neutrophils Absolute: 3.7 10*3/uL (ref 1.4–7.0)
Neutrophils: 45 %
Platelets: 235 10*3/uL (ref 150–450)
RBC: 4.49 x10E6/uL (ref 4.14–5.80)
RDW: 13.4 % (ref 11.6–15.4)
WBC: 8.1 10*3/uL (ref 3.4–10.8)

## 2021-03-13 MED ORDER — INSULIN PEN NEEDLE 32G X 4 MM MISC: 1 refills | Status: DC

## 2021-03-13 NOTE — Telephone Encounter (Signed)
Needs Rx for pen needles to [facility]

## 2021-03-17 ENCOUNTER — Ambulatory Visit: Payer: Medicare Other | Admitting: Family Medicine

## 2021-04-01 ENCOUNTER — Telehealth: Payer: Self-pay | Admitting: Family Medicine

## 2021-04-01 DIAGNOSIS — Z6841 Body Mass Index (BMI) 40.0 and over, adult: Secondary | ICD-10-CM

## 2021-04-01 DIAGNOSIS — E1141 Type 2 diabetes mellitus with diabetic mononeuropathy: Secondary | ICD-10-CM

## 2021-04-01 MED ORDER — OZEMPIC (0.25 OR 0.5 MG/DOSE) 2 MG/1.5ML ~~LOC~~ SOPN: 0.5000 mg | PEN_INJECTOR | SUBCUTANEOUS | 2 refills | Status: DC

## 2021-04-01 NOTE — Telephone Encounter (Signed)
Prescription changed back.

## 2021-04-02 NOTE — Telephone Encounter (Signed)
Pt aware.

## 2021-04-02 NOTE — Telephone Encounter (Signed)
lmtcb

## 2021-04-28 ENCOUNTER — Other Ambulatory Visit: Payer: Self-pay

## 2021-04-28 ENCOUNTER — Ambulatory Visit: Payer: Medicare Other | Admitting: Nurse Practitioner

## 2021-04-28 ENCOUNTER — Encounter: Payer: Self-pay | Admitting: Nurse Practitioner

## 2021-04-28 VITALS — BP 135/89 | HR 100 | Temp 97.6°F | Ht 75.0 in | Wt 389.0 lb

## 2021-04-28 DIAGNOSIS — H6123 Impacted cerumen, bilateral: Secondary | ICD-10-CM | POA: Diagnosis not present

## 2021-04-28 MED ORDER — DEBROX 6.5 % OT SOLN: 5.0000 [drp] | Freq: Two times a day (BID) | OTIC | 0 refills | Status: DC

## 2021-04-28 NOTE — Assessment & Plan Note (Signed)
Ears flushed, ear canal patent patient tolerated well.  Advised to use Debrox in the future to avoid cerumen impaction.  Patient verbalized understanding.  Follow-up with worsening hours of symptoms.

## 2021-04-28 NOTE — Progress Notes (Signed)
Acute Office Visit  Subjective:    Patient ID: Richard Crawford, male    DOB: 02/02/1956, 66 y.o.   MRN: [id number]  Chief Complaint  Patient presents with   Ear Fullness    Ear Fullness  There is pain in both ears. This is a recurrent problem. The current episode started 1 to 4 weeks ago. The problem occurs constantly. The problem has been unchanged. There has been no fever. The pain is mild. Pertinent negatives include no ear discharge, headaches or hearing loss.   Past Medical History:  Diagnosis Date   Anxiety    Arthritis    left shoulder   Atrial fibrillation ([place])    Cancer ([place]) 1989   thyroid   Chronic heart failure with preserved ejection fraction (HCC)    Diabetes mellitus without complication (HCC)    Hemorrhoids    external   Hx of colonic polyps    Hyperlipidemia    Hypertension    Low testosterone 12/14/2020   Migraine    Papillary thyroid carcinoma (HCC)    Postoperative hypothyroidism    Primary osteoarthritis of left shoulder    Sleep apnea    Venous stasis dermatitis of both lower extremities     Past Surgical History:  Procedure Laterality Date   ANKLE FUSION     KNEE ARTHROSCOPY     SHOULDER ARTHROSCOPY     TOTAL SHOULDER REPLACEMENT Right     Family History  Problem Relation Age of Onset   Hypertension Mother    Dementia Mother    Breast cancer Mother    Stomach cancer Mother    Ovarian cancer Mother    Heart disease Father     Social History   Socioeconomic History   Marital status: Married    Spouse name: Not on file   Number of children: Not on file   Years of education: Not on file   Highest education level: Not on file  Occupational History   Occupation: retired  Tobacco Use   Smoking status: Never   Smokeless tobacco: Never  Substance and Sexual Activity   Alcohol use: Not Currently   Drug use: Never   Sexual activity: Not on file  Other Topics Concern   Not on file  Social History Narrative   Lives at home with  wife - Originally from [place]; moved here so wife could be closer to her grandchildren (second wife)   Social Determinants of Radio broadcast assistant Strain: Low Risk    Difficulty of Paying Living Expenses: Not hard at all  Food Insecurity: No Food Insecurity   Worried About Charity fundraiser in the Last Year: Never true   Arboriculturist in the Last Year: Never true  Transportation Needs: No Transportation Needs   Lack of Transportation (Medical): No   Lack of Transportation (Non-Medical): No  Physical Activity: Insufficiently Active   Days of Exercise per Week: 3 days   Minutes of Exercise per Session: 30 min  Stress: No Stress Concern Present   Feeling of Stress : Not at all  Social Connections: Moderately Isolated   Frequency of Communication with Friends and Family: Three times a week   Frequency of Social Gatherings with Friends and Family: Once a week   Attends Religious Services: Never   Marine scientist or Organizations: No   Attends Archivist Meetings: Never   Marital Status: Married  Human resources officer Violence: Not At Risk   Fear of  Current or Ex-Partner: No   Emotionally Abused: No   Physically Abused: No   Sexually Abused: No    Outpatient Medications Prior to Visit  Medication Sig Dispense Refill   atorvastatin (LIPITOR) 20 MG tablet Take 1 tablet (20 mg total) by mouth daily. 90 tablet 1   celecoxib (CELEBREX) 200 MG capsule TAKE  (1)  CAPSULE  TWICE DAILY. 180 capsule 2   furosemide (LASIX) 80 MG tablet Take 1 tablet (80 mg total) by mouth daily. 90 tablet 1   gabapentin (NEURONTIN) 100 MG capsule TAKE 3 CAPSULES THREE TIMES DAILY 270 capsule 3   glucose blood test strip Check BS daily and as needed Dx E11.9 100 each 3   HYDROcodone-acetaminophen (NORCO/VICODIN) 5-325 MG tablet Take 1 tablet by mouth every 6 (six) hours as needed.     Insulin Pen Needle 32G X 4 MM MISC Use with insulin Dx E11.9 100 each 1   levothyroxine (SYNTHROID)  200 MCG tablet TAKE 1 TABLET (200 MCG TOTAL) BY MOUTH DAILY BEFORE BREAKFAST (TAKE IN ADDITION TO 50MCG TABLET) 30 tablet 0   levothyroxine (SYNTHROID) 50 MCG tablet TAKE 1 TABLET DAILY BEFORE BREAKFAST. (TAKE IN ADDITION TO 200MCG TABLET FOR TOTAL DOSE 250 MCG) 30 tablet 0   metFORMIN (GLUCOPHAGE-XR) 750 MG 24 hr tablet 1 tablet daily 90 tablet 1   metoprolol tartrate (LOPRESSOR) 100 MG tablet Take 2 tablets (200 mg total) by mouth daily. 180 tablet 1   Multiple Vitamin (MULTIVITAMIN) tablet Take 1 tablet by mouth 2 (two) times daily.     predniSONE (STERAPRED UNI-PAK 21 TAB) 10 MG (21) TBPK tablet As directed x 6 days 21 tablet 0   Prodigy Lancets 28G MISC Check BS daily and as needed Dx E11.9 100 each 3   rivaroxaban (XARELTO) 20 MG TABS tablet TAKE 1 TABLET (20 MG TOTAL) BY MOUTH DAILY WITH SUPPER FOR 30 DAYS. 90 tablet 1   Semaglutide,0.25 or 0.5MG/DOS, (OZEMPIC, 0.25 OR 0.5 MG/DOSE,) 2 MG/1.5ML SOPN Inject 0.5 mg into the skin once a week. 3 mL 2   spironolactone (ALDACTONE) 25 MG tablet Take 1 tablet (25 mg total) by mouth daily. 90 tablet 1   testosterone cypionate (DEPOTESTOSTERONE CYPIONATE) 200 MG/ML injection Inject 1 mL (200 mg total) into the muscle every 14 (fourteen) days. 10 mL 1   valsartan (DIOVAN) 80 MG tablet Take 1 tablet (80 mg total) by mouth daily. 90 tablet 3   No facility-administered medications prior to visit.    Allergies  Allergen Reactions   Morphine And Related Nausea And Vomiting    Review of Systems  HENT:  Positive for ear pain. Negative for ear discharge and hearing loss.   Eyes: Negative.   Respiratory: Negative.    Cardiovascular: Negative.   Gastrointestinal: Negative.   Neurological:  Negative for headaches.  All other systems reviewed and are negative.     Objective:    Physical Exam Vitals and nursing note reviewed.  Constitutional:      Appearance: Normal appearance. He is normal weight.  HENT:     Head: Normocephalic.     Right Ear:  There is impacted cerumen.     Left Ear: There is impacted cerumen.     Nose: Nose normal.     Mouth/Throat:     Mouth: Mucous membranes are moist.     Pharynx: Oropharynx is clear.  Eyes:     Conjunctiva/sclera: Conjunctivae normal.  Cardiovascular:     Rate and Rhythm: Normal rate and regular  rhythm.  Pulmonary:     Effort: Pulmonary effort is normal.     Breath sounds: Normal breath sounds.  Abdominal:     General: Bowel sounds are normal.  Skin:    Findings: No rash.  Neurological:     Mental Status: He is alert and oriented to person, place, and time.    BP 135/89   Pulse 100   Temp 97.6 F (36.4 C) (Temporal)   Ht _0  (1.905 m)   Wt (!) 389 lb (176.4 kg)   SpO2 95%   BMI 48.62 kg/m  Wt Readings from Last 3 Encounters:  04/28/21 (!) 389 lb (176.4 kg)  03/12/21 (!) 391 lb (177.4 kg)  03/03/21 (!) 395 lb (179.2 kg)    There are no preventive care reminders to display for this patient.  There are no preventive care reminders to display for this patient.   Lab Results  Component Value Date   TSH 1.380 08/26/2020   Lab Results  Component Value Date   WBC 8.1 03/12/2021   HGB 13.1 03/12/2021   HCT 39.3 03/12/2021   MCV 88 03/12/2021   PLT 235 03/12/2021   Lab Results  Component Value Date   NA 140 03/12/2021   K 4.4 03/12/2021   CO2 26 03/12/2021   GLUCOSE 157 (H) 03/12/2021   BUN 20 03/12/2021   CREATININE 1.20 03/12/2021   BILITOT 0.6 03/12/2021   ALKPHOS 107 03/12/2021   AST 24 03/12/2021   ALT 22 03/12/2021   PROT 7.9 03/12/2021   ALBUMIN 4.5 03/12/2021   CALCIUM 9.4 03/12/2021   ANIONGAP 9 08/05/2019   EGFR 67 03/12/2021   Lab Results  Component Value Date   CHOL 88 (L) 03/12/2021   Lab Results  Component Value Date   HDL 32 (L) 03/12/2021   Lab Results  Component Value Date   LDLCALC 33 03/12/2021   Lab Results  Component Value Date   TRIG 127 03/12/2021   Lab Results  Component Value Date   CHOLHDL 2.8 03/12/2021   Lab  Results  Component Value Date   HGBA1C 6.5 03/12/2021       Assessment & Plan:   Problem List Items Addressed This Visit       Nervous and Auditory   Bilateral impacted cerumen - Primary    Ears flushed, ear canal patent patient tolerated well.  Advised to use Debrox in the future to avoid cerumen impaction.  Patient verbalized understanding.  Follow-up with worsening hours of symptoms.       Relevant Medications   carbamide peroxide (DEBROX) 6.5 % OTIC solution     Meds ordered this encounter  Medications   carbamide peroxide (DEBROX) 6.5 % OTIC solution    Sig: Place 5 drops into both ears 2 (two) times daily.    Dispense:  15 mL    Refill:  0    Order Specific Question:   Supervising Provider    Answer:   Janora Norlander [[id number]]     Ivy Lynn, NP

## 2021-04-28 NOTE — Patient Instructions (Addendum)
Ear Irrigation Ear irrigation is a procedure to wash dirt and wax out of your ear canal. This procedure is also called lavage. You may need ear irrigation if you are having trouble hearing because of a buildup of earwax. You may also have ear irrigation as part of the treatment for an ear infection. Getting wax and dirtout of your ear canal can help ear drops work better. Tell a health care provider about: Any allergies you have. All medicines you are taking, including vitamins, herbs, eye drops, creams, and over-the-counter medicines. Any problems you or family members have had with anesthetic medicines. Any blood disorders you have. Any surgeries you have had. This includes any ear surgeries. Any medical conditions you have. Whether you are pregnant or may be pregnant. What are the risks? Generally, this is a safe procedure. However, problems may occur, including: Infection. Pain. Hearing loss. Fluid and debris being pushed through the eardrum and into the middle ear. This can occur if there are holes in the eardrum. Ear irrigation failing to work. What happens before the procedure? You will talk with your provider about the procedure and plan. You may be given ear drops to put in your ear 15-20 minutes before irrigation. This helps loosen the wax. What happens during the procedure?  A syringe is filled with water or saline solution, which is made of salt and water. The syringe is gently inserted into the ear canal. The fluid is used to flush out wax and other debris. The procedure may vary among health care providers and hospitals. What can I expect after the procedure? After an ear irrigation, follow instructions given to you by your health careprovider. Follow these instructions at home: Using ear irrigation kits Ear irrigation kits are available for use at home. Ask your health care provider if this is an option for you. In general, you should: Use a home irrigation kit only as  told by your health care provider. Read the package instructions carefully. Follow the directions for using the syringe. Use water that is room temperature. Do not do ear irrigation at home if you: Have diabetes. Diabetes increases the risk of infection. Have a hole or tear in your eardrum. Have tubes in your ears. Have had any ear surgery in the past. Have been told not to irrigate your ears. Cleaning your ears  Clean the outside of your ear with a soft washcloth daily. If told by your health care provider, use a few drops of baby oil, mineral oil, glycerin, hydrogen peroxide, or over-the-counter earwax softening drops. Do not use cotton swabs to clean your ears. These can push wax down into the ear canal. Do not put anything into your ears to try to remove wax. This includes ear candles.  General instructions Take over-the-counter and prescription medicines only as told by your health care provider. If you were prescribed an antibiotic medicine, use it as told by your health care provider. Do not stop using the antibiotic even if your condition improves. Keep the ear clean and dry by following the instructions from your health care provider. Keep all follow-up visits. This is important. Visit your health care provider at least once a year to have your ears and hearing checked. Contact a health care provider if: Your hearing is not improving or is getting worse. You have pain or redness in your ear. You are dizzy. You have ringing in your ears. You have nausea or vomiting. You have fluid, blood, or pus coming out of   your ear. Summary Ear irrigation is a procedure to wash dirt and wax out of your ear canal. This procedure is also called lavage. To perform ear irrigation, ear drops may be put in your ear 15-20 minutes before irrigation. Water or saline solution will be used to flush out earwax and other debris. You may be able to irrigate your ears at home. Ask your health care  provider if this is an option for you. Follow your health care provider's instructions. Clean your ears with a soft cloth after irrigation. Do not use cotton swabs to clean your ears. These can push wax down into the ear canal. This information is not intended to replace advice given to you by your health care provider. Make sure you discuss any questions you have with your healthcare provider. Document Revised: 01/15/2020 Document Reviewed: 01/15/2020 Elsevier Patient Education  2022 Elsevier Inc.  

## 2021-05-08 ENCOUNTER — Ambulatory Visit: Payer: Medicare Other | Admitting: Family Medicine

## 2021-05-08 ENCOUNTER — Encounter: Payer: Self-pay | Admitting: Family Medicine

## 2021-05-08 ENCOUNTER — Other Ambulatory Visit: Payer: Self-pay

## 2021-05-08 VITALS — BP 114/63 | HR 75 | Ht 75.0 in | Wt 389.0 lb

## 2021-05-08 DIAGNOSIS — H60501 Unspecified acute noninfective otitis externa, right ear: Secondary | ICD-10-CM

## 2021-05-08 MED ORDER — CIPROFLOXACIN-DEXAMETHASONE 0.3-0.1 % OT SUSP: 4.0000 [drp] | Freq: Two times a day (BID) | OTIC | 0 refills | Status: AC

## 2021-05-08 NOTE — Progress Notes (Signed)
Acute Office Visit  Subjective:    Patient ID: Richard Crawford, male    DOB: June 25, 1956, 65 y.o.   MRN: [id number]  Chief Complaint  Patient presents with   Ear Pain    Right/ flushed last week    HPI Patient is in today for right ear pain x 1 week. The pain is a little better today. It is tender to the touch. Denies drainage, fever, erythema, or swelling. Denies cough or congestion. Denies changes in hearing.   Past Medical History:  Diagnosis Date   Anxiety    Arthritis    left shoulder   Atrial fibrillation ([place])    Cancer ([place]) 1989   thyroid   Chronic heart failure with preserved ejection fraction (HCC)    Diabetes mellitus without complication (HCC)    Hemorrhoids    external   Hx of colonic polyps    Hyperlipidemia    Hypertension    Low testosterone 12/14/2020   Migraine    Papillary thyroid carcinoma (HCC)    Postoperative hypothyroidism    Primary osteoarthritis of left shoulder    Sleep apnea    Venous stasis dermatitis of both lower extremities     Past Surgical History:  Procedure Laterality Date   ANKLE FUSION     KNEE ARTHROSCOPY     SHOULDER ARTHROSCOPY     TOTAL SHOULDER REPLACEMENT Right     Family History  Problem Relation Age of Onset   Hypertension Mother    Dementia Mother    Breast cancer Mother    Stomach cancer Mother    Ovarian cancer Mother    Heart disease Father     Social History   Socioeconomic History   Marital status: Married    Spouse name: Not on file   Number of children: Not on file   Years of education: Not on file   Highest education level: Not on file  Occupational History   Occupation: retired  Tobacco Use   Smoking status: Never   Smokeless tobacco: Never  Substance and Sexual Activity   Alcohol use: Not Currently   Drug use: Never   Sexual activity: Not on file  Other Topics Concern   Not on file  Social History Narrative   Lives at home with wife - Originally from [place]; moved here so wife  could be closer to her grandchildren (second wife)   Social Determinants of Radio broadcast assistant Strain: Low Risk    Difficulty of Paying Living Expenses: Not hard at all  Food Insecurity: No Food Insecurity   Worried About Charity fundraiser in the Last Year: Never true   Arboriculturist in the Last Year: Never true  Transportation Needs: No Transportation Needs   Lack of Transportation (Medical): No   Lack of Transportation (Non-Medical): No  Physical Activity: Insufficiently Active   Days of Exercise per Week: 3 days   Minutes of Exercise per Session: 30 min  Stress: No Stress Concern Present   Feeling of Stress : Not at all  Social Connections: Moderately Isolated   Frequency of Communication with Friends and Family: Three times a week   Frequency of Social Gatherings with Friends and Family: Once a week   Attends Religious Services: Never   Marine scientist or Organizations: No   Attends Archivist Meetings: Never   Marital Status: Married  Human resources officer Violence: Not At Risk   Fear of Current or Ex-Partner: No  Emotionally Abused: No   Physically Abused: No   Sexually Abused: No    Outpatient Medications Prior to Visit  Medication Sig Dispense Refill   atorvastatin (LIPITOR) 20 MG tablet Take 1 tablet (20 mg total) by mouth daily. 90 tablet 1   carbamide peroxide (DEBROX) 6.5 % OTIC solution Place 5 drops into both ears 2 (two) times daily. 15 mL 0   celecoxib (CELEBREX) 200 MG capsule TAKE  (1)  CAPSULE  TWICE DAILY. 180 capsule 2   furosemide (LASIX) 80 MG tablet Take 1 tablet (80 mg total) by mouth daily. 90 tablet 1   gabapentin (NEURONTIN) 100 MG capsule TAKE 3 CAPSULES THREE TIMES DAILY 270 capsule 3   glucose blood test strip Check BS daily and as needed Dx E11.9 100 each 3   HYDROcodone-acetaminophen (NORCO/VICODIN) 5-325 MG tablet Take 1 tablet by mouth every 6 (six) hours as needed.     Insulin Pen Needle 32G X 4 MM MISC Use with  insulin Dx E11.9 100 each 1   levothyroxine (SYNTHROID) 200 MCG tablet TAKE 1 TABLET (200 MCG TOTAL) BY MOUTH DAILY BEFORE BREAKFAST (TAKE IN ADDITION TO 50MCG TABLET) 30 tablet 0   levothyroxine (SYNTHROID) 50 MCG tablet TAKE 1 TABLET DAILY BEFORE BREAKFAST. (TAKE IN ADDITION TO 200MCG TABLET FOR TOTAL DOSE 250 MCG) 30 tablet 0   metFORMIN (GLUCOPHAGE-XR) 750 MG 24 hr tablet 1 tablet daily 90 tablet 1   metoprolol tartrate (LOPRESSOR) 100 MG tablet Take 2 tablets (200 mg total) by mouth daily. 180 tablet 1   Multiple Vitamin (MULTIVITAMIN) tablet Take 1 tablet by mouth 2 (two) times daily.     Prodigy Lancets 28G MISC Check BS daily and as needed Dx E11.9 100 each 3   rivaroxaban (XARELTO) 20 MG TABS tablet TAKE 1 TABLET (20 MG TOTAL) BY MOUTH DAILY WITH SUPPER FOR 30 DAYS. 90 tablet 1   Semaglutide,0.25 or 0.5MG/DOS, (OZEMPIC, 0.25 OR 0.5 MG/DOSE,) 2 MG/1.5ML SOPN Inject 0.5 mg into the skin once a week. 3 mL 2   spironolactone (ALDACTONE) 25 MG tablet Take 1 tablet (25 mg total) by mouth daily. 90 tablet 1   testosterone cypionate (DEPOTESTOSTERONE CYPIONATE) 200 MG/ML injection Inject 1 mL (200 mg total) into the muscle every 14 (fourteen) days. 10 mL 1   valsartan (DIOVAN) 80 MG tablet Take 1 tablet (80 mg total) by mouth daily. 90 tablet 3   predniSONE (STERAPRED UNI-PAK 21 TAB) 10 MG (21) TBPK tablet As directed x 6 days 21 tablet 0   No facility-administered medications prior to visit.    Allergies  Allergen Reactions   Morphine And Related Nausea And Vomiting    Review of Systems As per HPI.     Objective:    Physical Exam Vitals and nursing note reviewed.  Constitutional:      General: He is not in acute distress.    Appearance: He is not ill-appearing, toxic-appearing or diaphoretic.  HENT:     Right Ear: Swelling (canal) and tenderness (tragus) present. No drainage. No mastoid tenderness.     Left Ear: No drainage, swelling or tenderness. No mastoid tenderness.   Pulmonary:     Effort: Pulmonary effort is normal. No respiratory distress.  Skin:    General: Skin is warm and dry.  Neurological:     Mental Status: He is alert and oriented to person, place, and time.  Psychiatric:        Mood and Affect: Mood normal.  Behavior: Behavior normal.    BP 114/63   Pulse 75   Ht 6' 3" (1.905 m)   Wt (!) 389 lb (176.4 kg)   SpO2 97%   BMI 48.62 kg/m  Wt Readings from Last 3 Encounters:  05/08/21 (!) 389 lb (176.4 kg)  04/28/21 (!) 389 lb (176.4 kg)  03/12/21 (!) 391 lb (177.4 kg)    There are no preventive care reminders to display for this patient.  There are no preventive care reminders to display for this patient.   Lab Results  Component Value Date   TSH 1.380 08/26/2020   Lab Results  Component Value Date   WBC 8.1 03/12/2021   HGB 13.1 03/12/2021   HCT 39.3 03/12/2021   MCV 88 03/12/2021   PLT 235 03/12/2021   Lab Results  Component Value Date   NA 140 03/12/2021   K 4.4 03/12/2021   CO2 26 03/12/2021   GLUCOSE 157 (H) 03/12/2021   BUN 20 03/12/2021   CREATININE 1.20 03/12/2021   BILITOT 0.6 03/12/2021   ALKPHOS 107 03/12/2021   AST 24 03/12/2021   ALT 22 03/12/2021   PROT 7.9 03/12/2021   ALBUMIN 4.5 03/12/2021   CALCIUM 9.4 03/12/2021   ANIONGAP 9 08/05/2019   EGFR 67 03/12/2021   Lab Results  Component Value Date   CHOL 88 (L) 03/12/2021   Lab Results  Component Value Date   HDL 32 (L) 03/12/2021   Lab Results  Component Value Date   LDLCALC 33 03/12/2021   Lab Results  Component Value Date   TRIG 127 03/12/2021   Lab Results  Component Value Date   CHOLHDL 2.8 03/12/2021   Lab Results  Component Value Date   HGBA1C 6.5 03/12/2021       Assessment & Plan:   Kerolos was seen today for ear pain.  Diagnoses and all orders for this visit:  Acute otitis externa of right ear, unspecified type Ciprodex as below. Handout given. Discussed prevention.  -     ciprofloxacin-dexamethasone  (CIPRODEX) OTIC suspension; Place 4 drops into the right ear 2 (two) times daily for 7 days.  Return to office for new or worsening symptoms, or if symptoms persist.   The patient indicates understanding of these issues and agrees with the plan.  Tiffany M Morgan, FNP  

## 2021-05-08 NOTE — Patient Instructions (Signed)
Otitis Externa  Otitis externa is an infection of the outer ear canal. The outer ear canal is the area between the outside of the ear and the eardrum. Otitis externa issometimes called swimmer's ear. What are the causes? Common causes of this condition include: Swimming in dirty water. Moisture in the ear. An injury to the inside of the ear. An object stuck in the ear. A cut or scrape on the outside of the ear. What increases the risk? You are more likely to develop this condition if you go swimming often. What are the signs or symptoms? The first symptom of this condition is often itching in the ear. Later symptoms of the condition include: Swelling of the ear. Redness in the ear. Ear pain. The pain may get worse when you pull on your ear. Pus coming from the ear. How is this diagnosed? This condition may be diagnosed by examining the ear and testing fluid from theear for bacteria and funguses. How is this treated? This condition may be treated with: Antibiotic ear drops. These are often given for 10-14 days. Medicines to reduce itching and swelling. Follow these instructions at home: If you were prescribed antibiotic ear drops, use them as told by your health care provider. Do not stop using the antibiotic even if your condition improves. Take over-the-counter and prescription medicines only as told by your health care provider. Avoid getting water in your ears as told by your health care provider. This may include avoiding swimming or water sports for a few days. Keep all follow-up visits as told by your health care provider. This is important. How is this prevented? Keep your ears dry. Use the corner of a towel to dry your ears after you swim or bathe. Avoid scratching or putting things in your ear. Doing these things can damage the ear canal or remove the protective wax that lines it, which makes it easier for bacteria and funguses to grow. Avoid swimming in lakes, polluted  water, or pools that may not have enough chlorine. Contact a health care provider if: You have a fever. Your ear is still red, swollen, painful, or draining pus after 3 days. Your redness, swelling, or pain gets worse. You have a severe headache. You have redness, swelling, pain, or tenderness in the area behind your ear. Summary Otitis externa is an infection of the outer ear canal. Common causes include swimming in dirty water, moisture in the ear, or a cut or scrape in the ear. Symptoms include pain, redness, and swelling of the ear. If you were prescribed antibiotic ear drops, use them as told by your health care provider. Do not stop using the antibiotic even if your condition improves. This information is not intended to replace advice given to you by your health care provider. Make sure you discuss any questions you have with your healthcare provider. Document Revised: 03/02/2018 Document Reviewed: 03/03/2018 Elsevier Patient Education  [employer].

## 2021-05-19 ENCOUNTER — Encounter: Payer: Self-pay | Admitting: Nurse Practitioner

## 2021-05-19 ENCOUNTER — Telehealth (INDEPENDENT_AMBULATORY_CARE_PROVIDER_SITE_OTHER): Payer: Medicare Other | Admitting: Nurse Practitioner

## 2021-05-19 DIAGNOSIS — L03115 Cellulitis of right lower limb: Secondary | ICD-10-CM

## 2021-05-19 MED ORDER — CEPHALEXIN 500 MG PO CAPS: 500.0000 mg | ORAL_CAPSULE | Freq: Two times a day (BID) | ORAL | 0 refills | Status: DC

## 2021-05-19 NOTE — Assessment & Plan Note (Signed)
Keflex 500 mg tablet by mouth daily, follow up in 3 days, watch for signs and symptoms of worsening cellulites, tylenol/ibuprophen for pain and fever.  Patient knows to follow up with worsening unresolved symptoms

## 2021-05-19 NOTE — Progress Notes (Signed)
Virtual Visit via Video Note   This visit type was conducted due to national recommendations for restrictions regarding the COVID-19 Pandemic (e.g. social distancing) in an effort to limit this patient's exposure and mitigate transmission in our community.  Due to his co-morbid illnesses, this patient is at least at moderate risk for complications without adequate follow up.  This format is felt to be most appropriate for this patient at this time.  All issues noted in this document were discussed and addressed.  A limited physical exam was performed with this format.  A verbal consent was obtained for the virtual visit.   Date:  05/19/2021   ID:  Richard Crawford, DOB Sep 09, 1956, MRN [id number]  Patient Location: Home Provider Location: Office/Clinic  PCP:  Loman Brooklyn, FNP   Evaluation Performed:  Acute Visit  Chief Complaint:  cellulitis  History of Present Illness:    Richard Crawford is a 65 y.o. male with Reviewed intake note. SUBJECTIVE: Richard Crawford is a 65 y.o. male who presents with erythema and tenderness.  Location: right leg  Onset: recurrent  Duration: 24 hours days and symptoms are improving  Associated symptoms: none  Recent treatment: none Functional status affected: no   Allergies: Morphine and related Patient Active Problem List   Diagnosis Date Noted   Cellulitis of right leg 05/19/2021   Bilateral impacted cerumen 04/28/2021   Low testosterone 12/14/2020   History of papillary adenocarcinoma of thyroid 12/01/2020   Chronic right shoulder pain 08/30/2019   Absolute anemia 08/30/2019   Diabetic mononeuropathy associated with type 2 diabetes mellitus ([place]) 06/25/2019   Hypertensive retinopathy of both eyes 03/21/2019   Permanent atrial fibrillation ([place]) 03/09/2019   Morbid obesity with BMI of 50.0-59.9, adult ([place]) 12/15/2018   History of colonic polyps 12/15/2018   Skin lesion 12/15/2018   Generalized anxiety disorder 06/29/2018   Chronic heart failure  with preserved ejection fraction ([place]) 05/01/2018   Hyperlipidemia associated with type 2 diabetes mellitus ([place]) 02/08/2018   Venous stasis dermatitis of both lower extremities 01/05/2018   Obstructive sleep apnea 01/05/2018   Primary osteoarthritis of left shoulder 11/15/2017   Combined forms of age-related cataract of both eyes 11/14/2017   Myopia with astigmatism and presbyopia, bilateral 11/14/2017   Hypertension associated with diabetes ([place]) 09/26/2017   Corns and callosity 09/26/2017   Postoperative hypothyroidism 09/26/2017   Type 2 diabetes mellitus without complication, without long-term current use of insulin ([place]) 09/26/2017      The patient does not have symptoms concerning for COVID-19 infection (fever, chills, cough, or new shortness of breath).    Past Medical History:  Diagnosis Date   Anxiety    Arthritis    left shoulder   Atrial fibrillation ([place])    Cancer ([place]) 1989   thyroid   Chronic heart failure with preserved ejection fraction (HCC)    Diabetes mellitus without complication ([place])    Hemorrhoids    external   Hx of colonic polyps    Hyperlipidemia    Hypertension    Low testosterone 12/14/2020   Migraine    Papillary thyroid carcinoma (HCC)    Postoperative hypothyroidism    Primary osteoarthritis of left shoulder    Sleep apnea    Venous stasis dermatitis of both lower extremities     Past Surgical History:  Procedure Laterality Date   ANKLE FUSION     KNEE ARTHROSCOPY     SHOULDER ARTHROSCOPY     TOTAL SHOULDER REPLACEMENT Right  Family History  Problem Relation Age of Onset   Hypertension Mother    Dementia Mother    Breast cancer Mother    Stomach cancer Mother    Ovarian cancer Mother    Heart disease Father     Social History   Socioeconomic History   Marital status: Married    Spouse name: Not on file   Number of children: Not on file   Years of education: Not on file   Highest education level: Not on file   Occupational History   Occupation: retired  Tobacco Use   Smoking status: Never   Smokeless tobacco: Never  Substance and Sexual Activity   Alcohol use: Not Currently   Drug use: Never   Sexual activity: Not on file  Other Topics Concern   Not on file  Social History Narrative   Lives at home with wife - Originally from [place]; moved here so wife could be closer to her grandchildren (second wife)   Social Determinants of Radio broadcast assistant Strain: Low Risk    Difficulty of Paying Living Expenses: Not hard at all  Food Insecurity: No Food Insecurity   Worried About Charity fundraiser in the Last Year: Never true   Arboriculturist in the Last Year: Never true  Transportation Needs: No Transportation Needs   Lack of Transportation (Medical): No   Lack of Transportation (Non-Medical): No  Physical Activity: Insufficiently Active   Days of Exercise per Week: 3 days   Minutes of Exercise per Session: 30 min  Stress: No Stress Concern Present   Feeling of Stress : Not at all  Social Connections: Moderately Isolated   Frequency of Communication with Friends and Family: Three times a week   Frequency of Social Gatherings with Friends and Family: Once a week   Attends Religious Services: Never   Marine scientist or Organizations: No   Attends Music therapist: Never   Marital Status: Married  Human resources officer Violence: Not At Risk   Fear of Current or Ex-Partner: No   Emotionally Abused: No   Physically Abused: No   Sexually Abused: No    Outpatient Medications Prior to Visit  Medication Sig Dispense Refill   atorvastatin (LIPITOR) 20 MG tablet Take 1 tablet (20 mg total) by mouth daily. 90 tablet 1   carbamide peroxide (DEBROX) 6.5 % OTIC solution Place 5 drops into both ears 2 (two) times daily. 15 mL 0   celecoxib (CELEBREX) 200 MG capsule TAKE  (1)  CAPSULE  TWICE DAILY. 180 capsule 2   furosemide (LASIX) 80 MG tablet Take 1 tablet (80 mg  total) by mouth daily. 90 tablet 1   gabapentin (NEURONTIN) 100 MG capsule TAKE 3 CAPSULES THREE TIMES DAILY 270 capsule 3   glucose blood test strip Check BS daily and as needed Dx E11.9 100 each 3   HYDROcodone-acetaminophen (NORCO/VICODIN) 5-325 MG tablet Take 1 tablet by mouth every 6 (six) hours as needed.     Insulin Pen Needle 32G X 4 MM MISC Use with insulin Dx E11.9 100 each 1   levothyroxine (SYNTHROID) 200 MCG tablet TAKE 1 TABLET (200 MCG TOTAL) BY MOUTH DAILY BEFORE BREAKFAST (TAKE IN ADDITION TO 50MCG TABLET) 30 tablet 0   levothyroxine (SYNTHROID) 50 MCG tablet TAKE 1 TABLET DAILY BEFORE BREAKFAST. (TAKE IN ADDITION TO 200MCG TABLET FOR TOTAL DOSE 250 MCG) 30 tablet 0   metFORMIN (GLUCOPHAGE-XR) 750 MG 24 hr tablet 1 tablet  daily 90 tablet 1   metoprolol tartrate (LOPRESSOR) 100 MG tablet Take 2 tablets (200 mg total) by mouth daily. 180 tablet 1   Multiple Vitamin (MULTIVITAMIN) tablet Take 1 tablet by mouth 2 (two) times daily.     Prodigy Lancets 28G MISC Check BS daily and as needed Dx E11.9 100 each 3   rivaroxaban (XARELTO) 20 MG TABS tablet TAKE 1 TABLET (20 MG TOTAL) BY MOUTH DAILY WITH SUPPER FOR 30 DAYS. 90 tablet 1   Semaglutide,0.25 or 0.'5MG'$ /DOS, (OZEMPIC, 0.25 OR 0.5 MG/DOSE,) 2 MG/1.5ML SOPN Inject 0.5 mg into the skin once a week. 3 mL 2   spironolactone (ALDACTONE) 25 MG tablet Take 1 tablet (25 mg total) by mouth daily. 90 tablet 1   testosterone cypionate (DEPOTESTOSTERONE CYPIONATE) 200 MG/ML injection Inject 1 mL (200 mg total) into the muscle every 14 (fourteen) days. 10 mL 1   valsartan (DIOVAN) 80 MG tablet Take 1 tablet (80 mg total) by mouth daily. 90 tablet 3   No facility-administered medications prior to visit.    Allergies:   Morphine and related   Social History   Tobacco Use   Smoking status: Never   Smokeless tobacco: Never  Substance Use Topics   Alcohol use: Not Currently   Drug use: Never     Review of Systems  Constitutional:   Positive for chills. Negative for fever.  HENT: Negative.    Respiratory: Negative.    Cardiovascular: Negative.   Genitourinary: Negative.   Skin:  Positive for rash.  All other systems reviewed and are negative.   Labs/Other Tests and Data Reviewed:    Recent Labs: 08/26/2020: TSH 1.380 03/12/2021: ALT 22; BUN 20; Creatinine, Ser 1.20; Hemoglobin 13.1; Platelets 235; Potassium 4.4; Sodium 140   Recent Lipid Panel Lab Results  Component Value Date/Time   CHOL 88 (L) 03/12/2021 02:20 PM   TRIG 127 03/12/2021 02:20 PM   HDL 32 (L) 03/12/2021 02:20 PM   CHOLHDL 2.8 03/12/2021 02:20 PM   LDLCALC 33 03/12/2021 02:20 PM    Wt Readings from Last 3 Encounters:  05/08/21 (!) 389 lb (176.4 kg)  04/28/21 (!) 389 lb (176.4 kg)  03/12/21 (!) 391 lb (177.4 kg)     Objective:    Vital Signs:  There were no vitals taken for this visit.   Physical Exam Constitutional:      Appearance: Normal appearance.  Neurological:     Mental Status: He is alert.    Virtual visit, limited Physical exam   ASSESSMENT & PLAN:    Cellulitis of right leg Keflex 500 mg tablet by mouth daily, follow up in 3 days, watch for signs and symptoms of worsening cellulites, tylenol/ibuprophen for pain and fever.  Patient knows to follow up with worsening unresolved symptoms   COVID-19 Education: The signs and symptoms of COVID-19 were discussed with the patient and how to seek care for testing (follow up with PCP or arrange E-visit). The importance of social distancing was discussed today.  Time:   Today, I have spent 11 minutes with the patient with telehealth technology discussing the above problems.    Follow Up:  Virtual Visit  in 3 day(s)  Signed, Ivy Lynn, NP  05/19/2021 1:44 PM    [place]

## 2021-05-22 ENCOUNTER — Telehealth (INDEPENDENT_AMBULATORY_CARE_PROVIDER_SITE_OTHER): Payer: Medicare Other | Admitting: Family Medicine

## 2021-05-22 ENCOUNTER — Encounter: Payer: Self-pay | Admitting: Family Medicine

## 2021-05-22 DIAGNOSIS — L03115 Cellulitis of right lower limb: Secondary | ICD-10-CM | POA: Diagnosis not present

## 2021-05-22 NOTE — Progress Notes (Signed)
Virtual Visit via Video Note Due to COVID-19 pandemic this visit was conducted virtually. This visit type was conducted due to national recommendations for restrictions regarding the COVID-19 Pandemic (e.g. social distancing, sheltering in place) in an effort to limit this patient's exposure and mitigate transmission in our community. All issues noted in this document were discussed and addressed.   I connected with Richard Crawford on 05/22/2021 at 1230 by video and verified that I am speaking with the correct person using two identifiers. Richard Crawford is currently located at home and  no one  is currently with them during visit. The provider, Monia Pouch, FNP is located in their office at time of visit.  I discussed the limitations, risks, security and privacy concerns of performing an evaluation and management service by video and the availability of in person appointments. I also discussed with the patient that there may be a patient responsible charge related to this service. The patient expressed understanding and agreed to proceed.  Subjective:  Patient ID: Richard Crawford, male    DOB: 1956-01-28, 65 y.o.   MRN: [id number]  Chief Complaint:  Cellulitis   HPI: Richard Crawford is a 65 y.o. male presenting on 05/22/2021 for Cellulitis   Pt is following up today for reevaluation of cellulitis. He was seen on 05/19/2021 and placed on Keflex for right lower leg cellulitis. He has been taking medication as prescribed and reports the swelling, erythema, and tenderness have improved greatly. He denies drainage, fever, chills, weakness, or confusion.     Relevant past medical, surgical, family, and social history reviewed and updated as indicated.  Allergies and medications reviewed and updated.   Past Medical History:  Diagnosis Date   Anxiety    Arthritis    left shoulder   Atrial fibrillation ([place])    Cancer ([place]) 1989   thyroid   Chronic heart failure with preserved ejection fraction  (HCC)    Diabetes mellitus without complication (HCC)    Hemorrhoids    external   Hx of colonic polyps    Hyperlipidemia    Hypertension    Low testosterone 12/14/2020   Migraine    Papillary thyroid carcinoma (HCC)    Postoperative hypothyroidism    Primary osteoarthritis of left shoulder    Sleep apnea    Venous stasis dermatitis of both lower extremities     Past Surgical History:  Procedure Laterality Date   ANKLE FUSION     KNEE ARTHROSCOPY     SHOULDER ARTHROSCOPY     TOTAL SHOULDER REPLACEMENT Right     Social History   Socioeconomic History   Marital status: Married    Spouse name: Not on file   Number of children: Not on file   Years of education: Not on file   Highest education level: Not on file  Occupational History   Occupation: retired  Tobacco Use   Smoking status: Never   Smokeless tobacco: Never  Substance and Sexual Activity   Alcohol use: Not Currently   Drug use: Never   Sexual activity: Not on file  Other Topics Concern   Not on file  Social History Narrative   Lives at home with wife - Originally from [place]; moved here so wife could be closer to her grandchildren (second wife)   Social Determinants of Health   Financial Resource Strain: Low Risk    Difficulty of Paying Living Expenses: Not hard at all  Food Insecurity: No Food Insecurity   Worried About Estate manager/land agent  of Food in the Last Year: Never true   [place] in the Last Year: Never true  Transportation Needs: No Transportation Needs   Lack of Transportation (Medical): No   Lack of Transportation (Non-Medical): No  Physical Activity: Insufficiently Active   Days of Exercise per Week: 3 days   Minutes of Exercise per Session: 30 min  Stress: No Stress Concern Present   Feeling of Stress : Not at all  Social Connections: Moderately Isolated   Frequency of Communication with Friends and Family: Three times a week   Frequency of Social Gatherings with Friends and  Family: Once a week   Attends Religious Services: Never   Marine scientist or Organizations: No   Attends Archivist Meetings: Never   Marital Status: Married  Human resources officer Violence: Not At Risk   Fear of Current or Ex-Partner: No   Emotionally Abused: No   Physically Abused: No   Sexually Abused: No    Outpatient Encounter Medications as of 05/22/2021  Medication Sig   atorvastatin (LIPITOR) 20 MG tablet Take 1 tablet (20 mg total) by mouth daily.   carbamide peroxide (DEBROX) 6.5 % OTIC solution Place 5 drops into both ears 2 (two) times daily.   celecoxib (CELEBREX) 200 MG capsule TAKE  (1)  CAPSULE  TWICE DAILY.   cephALEXin (KEFLEX) 500 MG capsule Take 1 capsule (500 mg total) by mouth 2 (two) times daily.   furosemide (LASIX) 80 MG tablet Take 1 tablet (80 mg total) by mouth daily.   gabapentin (NEURONTIN) 100 MG capsule TAKE 3 CAPSULES THREE TIMES DAILY   glucose blood test strip Check BS daily and as needed Dx E11.9   HYDROcodone-acetaminophen (NORCO/VICODIN) 5-325 MG tablet Take 1 tablet by mouth every 6 (six) hours as needed.   Insulin Pen Needle 32G X 4 MM MISC Use with insulin Dx E11.9   levothyroxine (SYNTHROID) 200 MCG tablet TAKE 1 TABLET (200 MCG TOTAL) BY MOUTH DAILY BEFORE BREAKFAST (TAKE IN ADDITION TO 50MCG TABLET)   levothyroxine (SYNTHROID) 50 MCG tablet TAKE 1 TABLET DAILY BEFORE BREAKFAST. (TAKE IN ADDITION TO 200MCG TABLET FOR TOTAL DOSE 250 MCG)   metFORMIN (GLUCOPHAGE-XR) 750 MG 24 hr tablet 1 tablet daily   metoprolol tartrate (LOPRESSOR) 100 MG tablet Take 2 tablets (200 mg total) by mouth daily.   Multiple Vitamin (MULTIVITAMIN) tablet Take 1 tablet by mouth 2 (two) times daily.   Prodigy Lancets 28G MISC Check BS daily and as needed Dx E11.9   rivaroxaban (XARELTO) 20 MG TABS tablet TAKE 1 TABLET (20 MG TOTAL) BY MOUTH DAILY WITH SUPPER FOR 30 DAYS.   Semaglutide,0.25 or 0.'5MG'$ /DOS, (OZEMPIC, 0.25 OR 0.5 MG/DOSE,) 2 MG/1.5ML SOPN Inject  0.5 mg into the skin once a week.   spironolactone (ALDACTONE) 25 MG tablet Take 1 tablet (25 mg total) by mouth daily.   testosterone cypionate (DEPOTESTOSTERONE CYPIONATE) 200 MG/ML injection Inject 1 mL (200 mg total) into the muscle every 14 (fourteen) days.   valsartan (DIOVAN) 80 MG tablet Take 1 tablet (80 mg total) by mouth daily.   No facility-administered encounter medications on file as of 05/22/2021.    Allergies  Allergen Reactions   Morphine And Related Nausea And Vomiting    Review of Systems  Constitutional:  Negative for activity change, appetite change, chills, diaphoresis, fatigue, fever and unexpected weight change.  HENT: Negative.    Eyes: Negative.   Respiratory:  Negative for cough, chest tightness and shortness of breath.  Cardiovascular:  Negative for chest pain, palpitations and leg swelling.  Gastrointestinal:  Negative for abdominal pain, blood in stool, constipation, diarrhea, nausea and vomiting.  Endocrine: Negative.   Genitourinary:  Negative for decreased urine volume, dysuria, frequency and urgency.  Musculoskeletal:  Negative for arthralgias and myalgias.  Skin:  Positive for color change. Negative for pallor, rash and wound.  Allergic/Immunologic: Negative.   Neurological:  Negative for dizziness, weakness and headaches.  Hematological: Negative.   Psychiatric/Behavioral:  Negative for confusion, hallucinations, sleep disturbance and suicidal ideas.   All other systems reviewed and are negative.       Observations/Objective: No vital signs or physical exam, this was a telephone or virtual health encounter.  Pt alert and oriented, answers all questions appropriately, and able to speak in full sentences.  RLE noted to have slight erythema, no drainage noted on video image.   Assessment and Plan: Madelyn was seen today for cellulitis.  Diagnoses and all orders for this visit:  Cellulitis of right leg Improving greatly since initial visit.  Complete course of keflex. If symptoms still present once Keflex completed, may need to extend regimen 3-5 more days. Pt aware to report any new or worsening symptoms.     Follow Up Instructions: Return if symptoms worsen or fail to improve.    I discussed the assessment and treatment plan with the patient. The patient was provided an opportunity to ask questions and all were answered. The patient agreed with the plan and demonstrated an understanding of the instructions.   The patient was advised to call back or seek an in-person evaluation if the symptoms worsen or if the condition fails to improve as anticipated.  The above assessment and management plan was discussed with the patient. The patient verbalized understanding of and has agreed to the management plan. Patient is aware to call the clinic if they develop any new symptoms or if symptoms persist or worsen. Patient is aware when to return to the clinic for a follow-up visit. Patient educated on when it is appropriate to go to the emergency department.    I provided 15 minutes of video face-to-face time during this encounter. The call started at 1230. The call ended at 1245. The other time was used for coordination of care.    Monia Pouch, FNP-C [facility] [address] [place], [place] [address] [phone] 05/22/2021

## 2021-06-09 ENCOUNTER — Other Ambulatory Visit: Payer: Self-pay | Admitting: Family Medicine

## 2021-06-09 DIAGNOSIS — E119 Type 2 diabetes mellitus without complications: Secondary | ICD-10-CM

## 2021-06-22 ENCOUNTER — Other Ambulatory Visit: Payer: Self-pay | Admitting: Family Medicine

## 2021-06-22 DIAGNOSIS — I152 Hypertension secondary to endocrine disorders: Secondary | ICD-10-CM

## 2021-06-22 DIAGNOSIS — E1159 Type 2 diabetes mellitus with other circulatory complications: Secondary | ICD-10-CM

## 2021-07-08 ENCOUNTER — Encounter: Payer: Self-pay | Admitting: Family Medicine

## 2021-07-08 ENCOUNTER — Ambulatory Visit: Payer: Medicare Other | Admitting: Family Medicine

## 2021-07-08 ENCOUNTER — Other Ambulatory Visit: Payer: Self-pay

## 2021-07-08 VITALS — BP 111/68 | HR 72 | Temp 97.3°F | Ht 75.0 in | Wt 385.0 lb

## 2021-07-08 DIAGNOSIS — L237 Allergic contact dermatitis due to plants, except food: Secondary | ICD-10-CM | POA: Diagnosis not present

## 2021-07-08 MED ORDER — METHYLPREDNISOLONE ACETATE 80 MG/ML IJ SUSP
80.0000 mg | Freq: Once | INTRAMUSCULAR | Status: AC
  Administered 2021-07-08: 80 mg via INTRAMUSCULAR

## 2021-07-08 NOTE — Progress Notes (Signed)
Subjective:  Patient ID: Richard Crawford, male    DOB: 07-Nov-1955, 65 y.o.   MRN: [id number]  Patient Care Team: Loman Brooklyn, FNP as PCP - General (Family Medicine) Minus Breeding, MD as PCP - Cardiology (Cardiology)   Chief Complaint:  Rash (Orbital rash x3 days, shingles?)   HPI: Richard Crawford is a 65 y.o. male presenting on 07/08/2021 for Rash (Orbital rash x3 days, shingles?)   Pt presents today with rash to forearms and face. States this started a few days ago and is slowly improving. He has been outside mowing grass. States the rash is pruritic and does have some clear drainage at times.   Rash This is a new problem. The current episode started in the past 7 days. The problem has been gradually improving since onset. The affected locations include the face, left arm and right arm. The rash is characterized by itchiness, redness, burning and blistering. It is unknown if there was an exposure to a precipitant. Pertinent negatives include no anorexia, congestion, cough, diarrhea, eye pain, facial edema, fatigue, fever, joint pain, nail changes, rhinorrhea, shortness of breath, sore throat or vomiting. Past treatments include nothing.    Relevant past medical, surgical, family, and social history reviewed and updated as indicated.  Allergies and medications reviewed and updated. Data reviewed: Chart in Epic.   Past Medical History:  Diagnosis Date   Anxiety    Arthritis    left shoulder   Atrial fibrillation ([place])    Cancer ([place]) 1989   thyroid   Chronic heart failure with preserved ejection fraction (HCC)    Diabetes mellitus without complication (HCC)    Hemorrhoids    external   Hx of colonic polyps    Hyperlipidemia    Hypertension    Low testosterone 12/14/2020   Migraine    Papillary thyroid carcinoma (HCC)    Postoperative hypothyroidism    Primary osteoarthritis of left shoulder    Sleep apnea    Venous stasis dermatitis of both lower extremities      Past Surgical History:  Procedure Laterality Date   ANKLE FUSION     KNEE ARTHROSCOPY     SHOULDER ARTHROSCOPY     TOTAL SHOULDER REPLACEMENT Right     Social History   Socioeconomic History   Marital status: Married    Spouse name: Not on file   Number of children: Not on file   Years of education: Not on file   Highest education level: Not on file  Occupational History   Occupation: retired  Tobacco Use   Smoking status: Never   Smokeless tobacco: Never  Substance and Sexual Activity   Alcohol use: Not Currently   Drug use: Never   Sexual activity: Not on file  Other Topics Concern   Not on file  Social History Narrative   Lives at home with wife - Originally from [place]; moved here so wife could be closer to her grandchildren (second wife)   Social Determinants of Radio broadcast assistant Strain: Low Risk    Difficulty of Paying Living Expenses: Not hard at all  Food Insecurity: No Food Insecurity   Worried About Charity fundraiser in the Last Year: Never true   Arboriculturist in the Last Year: Never true  Transportation Needs: No Transportation Needs   Lack of Transportation (Medical): No   Lack of Transportation (Non-Medical): No  Physical Activity: Insufficiently Active   Days of Exercise per Week: 3  days   Minutes of Exercise per Session: 30 min  Stress: No Stress Concern Present   Feeling of Stress : Not at all  Social Connections: Moderately Isolated   Frequency of Communication with Friends and Family: Three times a week   Frequency of Social Gatherings with Friends and Family: Once a week   Attends Religious Services: Never   Marine scientist or Organizations: No   Attends Archivist Meetings: Never   Marital Status: Married  Human resources officer Violence: Not At Risk   Fear of Current or Ex-Partner: No   Emotionally Abused: No   Physically Abused: No   Sexually Abused: No    Outpatient Encounter Medications as of  07/08/2021  Medication Sig   atorvastatin (LIPITOR) 20 MG tablet Take 1 tablet (20 mg total) by mouth daily.   celecoxib (CELEBREX) 200 MG capsule TAKE  (1)  CAPSULE  TWICE DAILY.   furosemide (LASIX) 80 MG tablet Take 1 tablet (80 mg total) by mouth daily.   gabapentin (NEURONTIN) 100 MG capsule TAKE 3 CAPSULES THREE TIMES DAILY   glucose blood test strip Check BS daily and as needed Dx E11.9   Insulin Pen Needle 32G X 4 MM MISC Use with insulin Dx E11.9   levothyroxine (SYNTHROID) 200 MCG tablet TAKE 1 TABLET (200 MCG TOTAL) BY MOUTH DAILY BEFORE BREAKFAST (TAKE IN ADDITION TO 50MCG TABLET)   levothyroxine (SYNTHROID) 50 MCG tablet TAKE 1 TABLET DAILY BEFORE BREAKFAST. (TAKE IN ADDITION TO 200MCG TABLET FOR TOTAL DOSE 250 MCG)   metFORMIN (GLUCOPHAGE-XR) 750 MG 24 hr tablet TAKE 1 TABLET DAILY   metoprolol tartrate (LOPRESSOR) 100 MG tablet TAKE (2) TABLETS DAILY   Multiple Vitamin (MULTIVITAMIN) tablet Take 1 tablet by mouth 2 (two) times daily.   Prodigy Lancets 28G MISC Check BS daily and as needed Dx E11.9   rivaroxaban (XARELTO) 20 MG TABS tablet TAKE 1 TABLET (20 MG TOTAL) BY MOUTH DAILY WITH SUPPER FOR 30 DAYS.   spironolactone (ALDACTONE) 25 MG tablet Take 1 tablet (25 mg total) by mouth daily.   valsartan (DIOVAN) 80 MG tablet Take 1 tablet (80 mg total) by mouth daily.   [DISCONTINUED] carbamide peroxide (DEBROX) 6.5 % OTIC solution Place 5 drops into both ears 2 (two) times daily.   [DISCONTINUED] cephALEXin (KEFLEX) 500 MG capsule Take 1 capsule (500 mg total) by mouth 2 (two) times daily.   [DISCONTINUED] HYDROcodone-acetaminophen (NORCO/VICODIN) 5-325 MG tablet Take 1 tablet by mouth every 6 (six) hours as needed.   [DISCONTINUED] Semaglutide,0.25 or 0.5MG/DOS, (OZEMPIC, 0.25 OR 0.5 MG/DOSE,) 2 MG/1.5ML SOPN Inject 0.5 mg into the skin once a week.   [DISCONTINUED] testosterone cypionate (DEPOTESTOSTERONE CYPIONATE) 200 MG/ML injection Inject 1 mL (200 mg total) into the muscle  every 14 (fourteen) days.   No facility-administered encounter medications on file as of 07/08/2021.    Allergies  Allergen Reactions   Morphine And Related Nausea And Vomiting    Review of Systems  Constitutional:  Negative for activity change, appetite change, chills, diaphoresis, fatigue, fever and unexpected weight change.  HENT: Negative.  Negative for congestion, rhinorrhea and sore throat.   Eyes: Negative.  Negative for pain.  Respiratory:  Negative for cough, chest tightness and shortness of breath.   Cardiovascular:  Negative for chest pain, palpitations and leg swelling.  Gastrointestinal:  Negative for abdominal pain, anorexia, blood in stool, constipation, diarrhea, nausea and vomiting.  Endocrine: Negative.   Genitourinary:  Negative for decreased urine volume, difficulty urinating,  dysuria, frequency and urgency.  Musculoskeletal:  Positive for arthralgias, back pain and gait problem. Negative for joint pain and myalgias.  Skin:  Positive for rash. Negative for nail changes.  Allergic/Immunologic: Negative.   Neurological:  Negative for dizziness, tremors, seizures, syncope, facial asymmetry, speech difficulty, weakness, light-headedness, numbness and headaches.  Hematological: Negative.   Psychiatric/Behavioral:  Negative for confusion, hallucinations, sleep disturbance and suicidal ideas.   All other systems reviewed and are negative.      Objective:  BP 111/68   Pulse 72   Temp (!) 97.3 F (36.3 C)   Ht _0  (1.905 m)   Wt (!) 385 lb (174.6 kg)   SpO2 97%   BMI 48.12 kg/m    Wt Readings from Last 3 Encounters:  07/08/21 (!) 385 lb (174.6 kg)  05/08/21 (!) 389 lb (176.4 kg)  04/28/21 (!) 389 lb (176.4 kg)    Physical Exam Vitals and nursing note reviewed.  Constitutional:      General: He is not in acute distress.    Appearance: Normal appearance. He is well-developed and well-groomed. He is morbidly obese. He is not ill-appearing, toxic-appearing or  diaphoretic.  HENT:     Head: Normocephalic and atraumatic.     Jaw: There is normal jaw occlusion.     Right Ear: Hearing normal.     Left Ear: Hearing normal.     Nose: Nose normal.     Mouth/Throat:     Lips: Pink.     Mouth: Mucous membranes are moist.     Pharynx: Oropharynx is clear. Uvula midline.  Eyes:     General: Lids are normal.     Extraocular Movements: Extraocular movements intact.     Conjunctiva/sclera: Conjunctivae normal.     Pupils: Pupils are equal, round, and reactive to light.  Neck:     Thyroid: No thyroid mass, thyromegaly or thyroid tenderness.     Vascular: No carotid bruit or JVD.     Trachea: Trachea and phonation normal.  Cardiovascular:     Rate and Rhythm: Normal rate and regular rhythm.     Chest Wall: PMI is not displaced.     Heart sounds: Normal heart sounds. No murmur heard.   No friction rub. No gallop.  Pulmonary:     Effort: Pulmonary effort is normal. No respiratory distress.     Breath sounds: Normal breath sounds. No wheezing.  Abdominal:     General: There is no distension or abdominal bruit.     Palpations: There is no hepatomegaly or splenomegaly.     Tenderness: There is no abdominal tenderness. There is no right CVA tenderness or left CVA tenderness.     Hernia: No hernia is present.  Musculoskeletal:     Cervical back: Normal range of motion and neck supple.     Right lower leg: No edema.     Left lower leg: No edema.  Lymphadenopathy:     Cervical: No cervical adenopathy.  Skin:    General: Skin is warm and dry.     Capillary Refill: Capillary refill takes less than 2 seconds.     Coloration: Skin is not cyanotic, jaundiced or pale.     Findings: Erythema and rash present. Rash is vesicular.     Comments: Pruritic, linear, vesicular rash to bilateral forearms, forehead, and around right eye  Neurological:     General: No focal deficit present.     Mental Status: He is alert and oriented to person, place,  and time.      Cranial Nerves: Cranial nerves are intact.     Sensory: Sensation is intact.     Motor: Motor function is intact.     Coordination: Coordination is intact.     Gait: Gait abnormal (uses cane).     Deep Tendon Reflexes: Reflexes are normal and symmetric.  Psychiatric:        Attention and Perception: Attention and perception normal.        Mood and Affect: Mood and affect normal.        Speech: Speech normal.        Behavior: Behavior normal. Behavior is cooperative.        Thought Content: Thought content normal.        Cognition and Memory: Cognition and memory normal.        Judgment: Judgment normal.    Results for orders placed or performed in visit on 03/12/21  CBC with Differential/Platelet  Result Value Ref Range   WBC 8.1 3.4 - 10.8 x10E3/uL   RBC 4.49 4.14 - 5.80 x10E6/uL   Hemoglobin 13.1 13.0 - 17.7 g/dL   Hematocrit 39.3 37.5 - 51.0 %   MCV 88 79 - 97 fL   MCH 29.2 26.6 - 33.0 pg   MCHC 33.3 31.5 - 35.7 g/dL   RDW 13.4 11.6 - 15.4 %   Platelets 235 150 - 450 x10E3/uL   Neutrophils 45 Not Estab. %   Lymphs 38 Not Estab. %   Monocytes 13 Not Estab. %   Eos 3 Not Estab. %   Basos 1 Not Estab. %   Neutrophils Absolute 3.7 1.4 - 7.0 x10E3/uL   Lymphocytes Absolute 3.1 0.7 - 3.1 x10E3/uL   Monocytes Absolute 1.0 (H) 0.1 - 0.9 x10E3/uL   EOS (ABSOLUTE) 0.2 0.0 - 0.4 x10E3/uL   Basophils Absolute 0.1 0.0 - 0.2 x10E3/uL   Immature Granulocytes 0 Not Estab. %   Immature Grans (Abs) 0.0 0.0 - 0.1 x10E3/uL  CMP14+EGFR  Result Value Ref Range   Glucose 157 (H) 65 - 99 mg/dL   BUN 20 8 - 27 mg/dL   Creatinine, Ser 1.20 0.76 - 1.27 mg/dL   eGFR 67 >59 mL/min/1.73   BUN/Creatinine Ratio 17 10 - 24   Sodium 140 134 - 144 mmol/L   Potassium 4.4 3.5 - 5.2 mmol/L   Chloride 101 96 - 106 mmol/L   CO2 26 20 - 29 mmol/L   Calcium 9.4 8.6 - 10.2 mg/dL   Total Protein 7.9 6.0 - 8.5 g/dL   Albumin 4.5 3.8 - 4.8 g/dL   Globulin, Total 3.4 1.5 - 4.5 g/dL   Albumin/Globulin  Ratio 1.3 1.2 - 2.2   Bilirubin Total 0.6 0.0 - 1.2 mg/dL   Alkaline Phosphatase 107 44 - 121 IU/L   AST 24 0 - 40 IU/L   ALT 22 0 - 44 IU/L  Lipid panel  Result Value Ref Range   Cholesterol, Total 88 (L) 100 - 199 mg/dL   Triglycerides 127 0 - 149 mg/dL   HDL 32 (L) >39 mg/dL   VLDL Cholesterol Cal 23 5 - 40 mg/dL   LDL Chol Calc (NIH) 33 0 - 99 mg/dL   Chol/HDL Ratio 2.8 0.0 - 5.0 ratio  Bayer DCA Hb A1c Waived  Result Value Ref Range   HB A1C (BAYER DCA - WAIVED) 6.5 <7.0 %       Pertinent labs & imaging results that were available during my care of  the patient were reviewed by me and considered in my medical decision making.  Assessment & Plan:  Leonidas was seen today for rash.  Diagnoses and all orders for this visit:  Contact dermatitis due to poison oak Rash consistent with poison oak. Has been mowing lawn. Will burst with IM steroids in office today as rash is on face. Pt aware to monitor BS more frequently due to steroid injection. Symptomatic care discussed in detail. Report any new, worsening, or persistent symptoms.     Continue all other maintenance medications.  Follow up plan: Return if symptoms worsen or fail to improve.   Continue healthy lifestyle choices, including diet (rich in fruits, vegetables, and lean proteins, and low in salt and simple carbohydrates) and exercise (at least 30 minutes of moderate physical activity daily).  Educational handout given for poison oak dermatitis   The above assessment and management plan was discussed with the patient. The patient verbalized understanding of and has agreed to the management plan. Patient is aware to call the clinic if they develop any new symptoms or if symptoms persist or worsen. Patient is aware when to return to the clinic for a follow-up visit. Patient educated on when it is appropriate to go to the emergency department.   Monia Pouch, FNP-C [facility] [phone]

## 2021-07-16 ENCOUNTER — Other Ambulatory Visit: Payer: Self-pay

## 2021-07-16 ENCOUNTER — Ambulatory Visit (INDEPENDENT_AMBULATORY_CARE_PROVIDER_SITE_OTHER): Payer: Medicare Other | Admitting: Family Medicine

## 2021-07-16 ENCOUNTER — Encounter: Payer: Medicare Other | Admitting: Family Medicine

## 2021-07-16 ENCOUNTER — Encounter: Payer: Self-pay | Admitting: Family Medicine

## 2021-07-16 VITALS — BP 121/67 | HR 88 | Temp 97.8°F | Ht 75.0 in | Wt 380.0 lb

## 2021-07-16 DIAGNOSIS — Z125 Encounter for screening for malignant neoplasm of prostate: Secondary | ICD-10-CM

## 2021-07-16 DIAGNOSIS — E1159 Type 2 diabetes mellitus with other circulatory complications: Secondary | ICD-10-CM

## 2021-07-16 DIAGNOSIS — E785 Hyperlipidemia, unspecified: Secondary | ICD-10-CM

## 2021-07-16 DIAGNOSIS — E1169 Type 2 diabetes mellitus with other specified complication: Secondary | ICD-10-CM | POA: Diagnosis not present

## 2021-07-16 DIAGNOSIS — Z6841 Body Mass Index (BMI) 40.0 and over, adult: Secondary | ICD-10-CM

## 2021-07-16 DIAGNOSIS — R0609 Other forms of dyspnea: Secondary | ICD-10-CM

## 2021-07-16 DIAGNOSIS — E1141 Type 2 diabetes mellitus with diabetic mononeuropathy: Secondary | ICD-10-CM

## 2021-07-16 DIAGNOSIS — E559 Vitamin D deficiency, unspecified: Secondary | ICD-10-CM

## 2021-07-16 DIAGNOSIS — I152 Hypertension secondary to endocrine disorders: Secondary | ICD-10-CM

## 2021-07-16 LAB — BAYER DCA HB A1C WAIVED: HB A1C (BAYER DCA - WAIVED): 6.6 % — ABNORMAL HIGH (ref 4.8–5.6)

## 2021-07-16 MED ORDER — RYBELSUS 7 MG PO TABS
7.0000 mg | ORAL_TABLET | Freq: Every day | ORAL | 0 refills | Status: DC
Start: 2021-07-16 — End: 2021-08-18

## 2021-07-16 NOTE — Patient Instructions (Signed)

## 2021-07-16 NOTE — Progress Notes (Signed)
Subjective:  Patient ID: Richard Crawford, male    DOB: 1956/04/28, 65 y.o.   MRN: [id number]  Patient Care Team: Baruch Gouty, FNP as PCP - General (Family Medicine) Minus Breeding, MD as PCP - Cardiology (Cardiology)   Chief Complaint:  Medical Management of Chronic Issues   HPI: Richard Crawford is a 65 y.o. male presenting on 07/16/2021 for Medical Management of Chronic Issues   Pt presents today for management of chronic medical conditions. He has been doing fairly well. He remains tired all of the time and has noticed an increase in his DOE and orthopnea over the last few months. He is followed by cardiology on a regular basis for heart failure and chronic A-Fib which has been fairly controlled. Last echo 2020 revealed EF 60-65%.  He has T2DM which is very well controlled. He has associated hypertension and hyperlipidemia which is also well controlled. He is on statin, ARB, not on ASA due to Xarelto and easy bruising. BS averages 120-150s. No reported hypo- or hyperglycemic episodes. 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 He is scheduled to have his eye exam next month. He was on ozempic but kept forgetting the weekly dosing so he stopped taking this. States he would do better with a pill he can add to his daily medications.  He has vit D deficiency but is not on repletion therapy.  He has hypothyroidism and is followed by endocrinology on a regular basis. Feels his synthroid dose needs to be increased as he still has fatigue and malaise with weight gain.     Relevant past medical, surgical, family, and social history reviewed and updated as indicated.  Allergies and medications reviewed and updated. Data reviewed: Chart in Epic.   Past Medical History:  Diagnosis Date   Anxiety    Arthritis    left shoulder   Atrial fibrillation ([place])    Cancer ([place]) 1989   thyroid   Chronic heart failure with preserved ejection fraction (HCC)    Diabetes mellitus without  complication (HCC)    Hemorrhoids    external   Hx of colonic polyps    Hyperlipidemia    Hypertension    Low testosterone 12/14/2020   Migraine    Papillary thyroid carcinoma (HCC)    Postoperative hypothyroidism    Primary osteoarthritis of left shoulder    Sleep apnea    Venous stasis dermatitis of both lower extremities     Past Surgical History:  Procedure Laterality Date   ANKLE FUSION     KNEE ARTHROSCOPY     SHOULDER ARTHROSCOPY     TOTAL SHOULDER REPLACEMENT Right     Social History   Socioeconomic History   Marital status: Married    Spouse name: Not on file   Number of children: Not on file   Years of education: Not on file   Highest education level: Not on file  Occupational History   Occupation: retired  Tobacco Use   Smoking status: Never   Smokeless tobacco: Never  Substance and Sexual Activity   Alcohol use: Not Currently   Drug use: Never   Sexual activity: Not on file  Other Topics Concern   Not on file  Social History Narrative   Lives at home with wife - Originally from [place]; moved here so wife could be closer to her grandchildren (second wife)   Social Determinants of Health   Financial Resource Strain: Low Risk    Difficulty of Paying  Living Expenses: Not hard at all  Food Insecurity: No Food Insecurity   Worried About [place] in the Last Year: Never true   Ran Out of Food in the Last Year: Never true  Transportation Needs: No Transportation Needs   Lack of Transportation (Medical): No   Lack of Transportation (Non-Medical): No  Physical Activity: Insufficiently Active   Days of Exercise per Week: 3 days   Minutes of Exercise per Session: 30 min  Stress: No Stress Concern Present   Feeling of Stress : Not at all  Social Connections: Moderately Isolated   Frequency of Communication with Friends and Family: Three times a week   Frequency of Social Gatherings with Friends and Family: Once a week   Attends Religious  Services: Never   Marine scientist or Organizations: No   Attends Archivist Meetings: Never   Marital Status: Married  Human resources officer Violence: Not At Risk   Fear of Current or Ex-Partner: No   Emotionally Abused: No   Physically Abused: No   Sexually Abused: No    Outpatient Encounter Medications as of 07/16/2021  Medication Sig   atorvastatin (LIPITOR) 20 MG tablet Take 1 tablet (20 mg total) by mouth daily.   celecoxib (CELEBREX) 200 MG capsule TAKE  (1)  CAPSULE  TWICE DAILY.   furosemide (LASIX) 80 MG tablet Take 1 tablet (80 mg total) by mouth daily.   gabapentin (NEURONTIN) 100 MG capsule TAKE 3 CAPSULES THREE TIMES DAILY   glucose blood test strip Check BS daily and as needed Dx E11.9   levothyroxine (SYNTHROID) 200 MCG tablet TAKE 1 TABLET (200 MCG TOTAL) BY MOUTH DAILY BEFORE BREAKFAST (TAKE IN ADDITION TO 50MCG TABLET)   levothyroxine (SYNTHROID) 50 MCG tablet TAKE 1 TABLET DAILY BEFORE BREAKFAST. (TAKE IN ADDITION TO 200MCG TABLET FOR TOTAL DOSE 250 MCG)   metFORMIN (GLUCOPHAGE-XR) 750 MG 24 hr tablet TAKE 1 TABLET DAILY   metoprolol tartrate (LOPRESSOR) 100 MG tablet TAKE (2) TABLETS DAILY   Multiple Vitamin (MULTIVITAMIN) tablet Take 1 tablet by mouth 2 (two) times daily.   Prodigy Lancets 28G MISC Check BS daily and as needed Dx E11.9   rivaroxaban (XARELTO) 20 MG TABS tablet TAKE 1 TABLET (20 MG TOTAL) BY MOUTH DAILY WITH SUPPER FOR 30 DAYS.   Semaglutide (RYBELSUS) 7 MG TABS Take 7 mg by mouth daily.   spironolactone (ALDACTONE) 25 MG tablet Take 1 tablet (25 mg total) by mouth daily.   valsartan (DIOVAN) 80 MG tablet Take 1 tablet (80 mg total) by mouth daily.   [DISCONTINUED] Insulin Pen Needle 32G X 4 MM MISC Use with insulin Dx E11.9   No facility-administered encounter medications on file as of 07/16/2021.    Allergies  Allergen Reactions   Morphine And Related Nausea And Vomiting and Nausea Only    Review of Systems  Constitutional:   Positive for fatigue and unexpected weight change. Negative for activity change, appetite change, chills, diaphoresis and fever.  HENT: Negative.    Eyes: Negative.  Negative for photophobia and visual disturbance.  Respiratory:  Positive for shortness of breath (with exertion, orthopnea). Negative for cough and chest tightness.   Cardiovascular:  Positive for palpitations and leg swelling. Negative for chest pain.  Gastrointestinal:  Negative for abdominal distention, abdominal pain, anal bleeding, blood in stool, constipation, diarrhea, nausea, rectal pain and vomiting.  Endocrine: Negative.  Negative for cold intolerance, heat intolerance, polydipsia, polyphagia and polyuria.  Genitourinary:  Negative for decreased  urine volume, difficulty urinating, dysuria, enuresis, flank pain, frequency, genital sores, hematuria, penile discharge, penile pain, penile swelling, scrotal swelling, testicular pain and urgency.  Musculoskeletal:  Positive for arthralgias, back pain, gait problem (uses cane), joint swelling and myalgias. Negative for neck pain and neck stiffness.  Skin: Negative.   Allergic/Immunologic: Negative.   Neurological:  Negative for dizziness, tremors, seizures, syncope, facial asymmetry, speech difficulty, weakness, light-headedness, numbness (tinlging to feet) and headaches.  Hematological: Negative.   Psychiatric/Behavioral:  Negative for confusion, hallucinations, sleep disturbance and suicidal ideas.   All other systems reviewed and are negative.      Objective:  BP 121/67   Pulse 88   Temp 97.8 F (36.6 C)   Ht _0  (1.905 m)   Wt (!) 380 lb (172.4 kg)   SpO2 98%   BMI 47.50 kg/m    Wt Readings from Last 3 Encounters:  07/16/21 (!) 380 lb (172.4 kg)  07/08/21 (!) 385 lb (174.6 kg)  05/08/21 (!) 389 lb (176.4 kg)    Physical Exam Vitals and nursing note reviewed.  Constitutional:      General: He is not in acute distress.    Appearance: Normal appearance. He is  well-developed and well-groomed. He is morbidly obese. He is not ill-appearing, toxic-appearing or diaphoretic.  HENT:     Head: Normocephalic and atraumatic.     Jaw: There is normal jaw occlusion.     Right Ear: Hearing, tympanic membrane, ear canal and external ear normal.     Left Ear: Hearing, tympanic membrane, ear canal and external ear normal.     Nose: Nose normal.     Mouth/Throat:     Lips: Pink.     Mouth: Mucous membranes are moist.     Pharynx: Oropharynx is clear. Uvula midline.  Eyes:     General: Lids are normal.     Extraocular Movements: Extraocular movements intact.     Conjunctiva/sclera: Conjunctivae normal.     Pupils: Pupils are equal, round, and reactive to light.  Neck:     Thyroid: No thyroid mass, thyromegaly or thyroid tenderness.     Vascular: No carotid bruit or JVD.     Trachea: Trachea and phonation normal.  Cardiovascular:     Rate and Rhythm: Normal rate. Rhythm irregularly irregular.     Chest Wall: PMI is not displaced.     Pulses: Normal pulses.     Heart sounds: Normal heart sounds. No murmur heard.   No friction rub. No gallop.  Pulmonary:     Effort: Pulmonary effort is normal. No respiratory distress.     Breath sounds: Normal breath sounds. No stridor. No wheezing, rhonchi or rales.  Abdominal:     General: Bowel sounds are normal. There is no abdominal bruit.     Palpations: Abdomen is soft. There is no hepatomegaly or splenomegaly.  Musculoskeletal:        General: Normal range of motion.     Cervical back: Normal range of motion and neck supple.     Right lower leg: 1+ Edema present.     Left lower leg: 1+ Edema present.     Comments: Brace to left ankle  Lymphadenopathy:     Cervical: No cervical adenopathy.  Skin:    General: Skin is warm and dry.     Capillary Refill: Capillary refill takes less than 2 seconds.     Coloration: Skin is not cyanotic, jaundiced or pale.     Findings: No rash.  Comments: Scattered skin tags   Neurological:     General: No focal deficit present.     Mental Status: He is alert and oriented to person, place, and time.     Cranial Nerves: Cranial nerves are intact.     Sensory: Sensation is intact.     Motor: Motor function is intact.     Coordination: Coordination is intact.     Gait: Gait abnormal (uses cane).     Deep Tendon Reflexes: Reflexes are normal and symmetric.  Psychiatric:        Attention and Perception: Attention and perception normal.        Mood and Affect: Mood and affect normal.        Speech: Speech normal.        Behavior: Behavior normal. Behavior is cooperative.        Thought Content: Thought content normal.        Cognition and Memory: Cognition and memory normal.        Judgment: Judgment normal.    Results for orders placed or performed in visit on 03/12/21  CBC with Differential/Platelet  Result Value Ref Range   WBC 8.1 3.4 - 10.8 x10E3/uL   RBC 4.49 4.14 - 5.80 x10E6/uL   Hemoglobin 13.1 13.0 - 17.7 g/dL   Hematocrit 39.3 37.5 - 51.0 %   MCV 88 79 - 97 fL   MCH 29.2 26.6 - 33.0 pg   MCHC 33.3 31.5 - 35.7 g/dL   RDW 13.4 11.6 - 15.4 %   Platelets 235 150 - 450 x10E3/uL   Neutrophils 45 Not Estab. %   Lymphs 38 Not Estab. %   Monocytes 13 Not Estab. %   Eos 3 Not Estab. %   Basos 1 Not Estab. %   Neutrophils Absolute 3.7 1.4 - 7.0 x10E3/uL   Lymphocytes Absolute 3.1 0.7 - 3.1 x10E3/uL   Monocytes Absolute 1.0 (H) 0.1 - 0.9 x10E3/uL   EOS (ABSOLUTE) 0.2 0.0 - 0.4 x10E3/uL   Basophils Absolute 0.1 0.0 - 0.2 x10E3/uL   Immature Granulocytes 0 Not Estab. %   Immature Grans (Abs) 0.0 0.0 - 0.1 x10E3/uL  CMP14+EGFR  Result Value Ref Range   Glucose 157 (H) 65 - 99 mg/dL   BUN 20 8 - 27 mg/dL   Creatinine, Ser 1.20 0.76 - 1.27 mg/dL   eGFR 67 >59 mL/min/1.73   BUN/Creatinine Ratio 17 10 - 24   Sodium 140 134 - 144 mmol/L   Potassium 4.4 3.5 - 5.2 mmol/L   Chloride 101 96 - 106 mmol/L   CO2 26 20 - 29 mmol/L   Calcium 9.4 8.6 - 10.2  mg/dL   Total Protein 7.9 6.0 - 8.5 g/dL   Albumin 4.5 3.8 - 4.8 g/dL   Globulin, Total 3.4 1.5 - 4.5 g/dL   Albumin/Globulin Ratio 1.3 1.2 - 2.2   Bilirubin Total 0.6 0.0 - 1.2 mg/dL   Alkaline Phosphatase 107 44 - 121 IU/L   AST 24 0 - 40 IU/L   ALT 22 0 - 44 IU/L  Lipid panel  Result Value Ref Range   Cholesterol, Total 88 (L) 100 - 199 mg/dL   Triglycerides 127 0 - 149 mg/dL   HDL 32 (L) >39 mg/dL   VLDL Cholesterol Cal 23 5 - 40 mg/dL   LDL Chol Calc (NIH) 33 0 - 99 mg/dL   Chol/HDL Ratio 2.8 0.0 - 5.0 ratio  Bayer DCA Hb A1c Waived  Result Value Ref Range  HB A1C (BAYER DCA - WAIVED) 6.5 <7.0 %       Pertinent labs & imaging results that were available during my care of the patient were reviewed by me and considered in my medical decision making.  Assessment & Plan:  Prathik was seen today for medical management of chronic issues.  Diagnoses and all orders for this visit:  Type 2 diabetes mellitus with diabetic mononeuropathy, without long-term current use of insulin (HCC) A1C 6.6 today. Will add Rybelsus to regimen as pt forgets to take Ozempic weekly. Diet and exercise encouraged. Follow up in 3 months. Other labs pending. No history of medullary thyroid cancer, had papillary adenocarcinoma of thyroid.  -     Bayer DCA Hb A1c Waived -     Thyroid Panel With TSH -     Lipid panel -     Microalbumin / creatinine urine ratio -     CBC with Differential/Platelet -     CMP14+EGFR -     Semaglutide (RYBELSUS) 7 MG TABS; Take 7 mg by mouth daily.  Morbid obesity with BMI of 50.0-59.9, adult ([place]) Diet and exercise encouraged. Rybelsus added to diabetic regimen, will be beneficial for weight loss.  -     Bayer DCA Hb A1c Waived -     Thyroid Panel With TSH -     Lipid panel -     Microalbumin / creatinine urine ratio -     CBC with Differential/Platelet -     CMP14+EGFR -     VITAMIN D 25 Hydroxy (Vit-D Deficiency, Fractures)  Hypertension associated with diabetes  ([place]) Well controlled on current regimen, continue. DASH diet and exercise encouraged. Labs pending.  -     Thyroid Panel With TSH -     Lipid panel -     Microalbumin / creatinine urine ratio -     CBC with Differential/Platelet -     CMP14+EGFR  Hyperlipidemia associated with type 2 diabetes mellitus ([place]) Labs pending. Diet and exercise encouraged. Continue statin therapy.  -     Lipid panel -     CMP14+EGFR  Vitamin D deficiency Labs pending, will initiate repletion therapy if warranted.  -     CBC with Differential/Platelet -     VITAMIN D 25 Hydroxy (Vit-D Deficiency, Fractures)  Screening for prostate cancer -     PR PSA, TOTAL SCREENING  DOE (dyspnea on exertion) Increasing over last several months with orthopnea. Has upcoming visit with cardiology. Would like repeat echo to evaluate for worsening heart failure.     Continue all other maintenance medications.  Follow up plan: Return in about 3 months (around 10/16/2021), or if symptoms worsen or fail to improve, for DM.   Continue healthy lifestyle choices, including diet (rich in fruits, vegetables, and lean proteins, and low in salt and simple carbohydrates) and exercise (at least 30 minutes of moderate physical activity daily).  Educational handout given for DM  The above assessment and management plan was discussed with the patient. The patient verbalized understanding of and has agreed to the management plan. Patient is aware to call the clinic if they develop any new symptoms or if symptoms persist or worsen. Patient is aware when to return to the clinic for a follow-up visit. Patient educated on when it is appropriate to go to the emergency department.   Monia Pouch, FNP-C [facility] [phone]

## 2021-07-17 LAB — LIPID PANEL
Chol/HDL Ratio: 2.2 ratio (ref 0.0–5.0)
Cholesterol, Total: 85 mg/dL — ABNORMAL LOW (ref 100–199)
HDL: 38 mg/dL — ABNORMAL LOW (ref >39)
LDL Chol Calc (NIH): 30 mg/dL (ref 0–99)
Triglycerides: 86 mg/dL (ref 0–149)
VLDL Cholesterol Cal: 17 mg/dL (ref 5–40)

## 2021-07-17 LAB — CBC WITH DIFFERENTIAL/PLATELET
Basophils Absolute: 0.1 10*3/uL (ref 0.0–0.2)
Basos: 1 %
EOS (ABSOLUTE): 0.1 10*3/uL (ref 0.0–0.4)
Eos: 1 %
Hematocrit: 41.2 % (ref 37.5–51.0)
Hemoglobin: 13.3 g/dL (ref 13.0–17.7)
Immature Grans (Abs): 0 10*3/uL (ref 0.0–0.1)
Immature Granulocytes: 0 %
Lymphocytes Absolute: 3.6 10*3/uL — ABNORMAL HIGH (ref 0.7–3.1)
Lymphs: 31 %
MCH: 29.4 pg (ref 26.6–33.0)
MCHC: 32.3 g/dL (ref 31.5–35.7)
MCV: 91 fL (ref 79–97)
Monocytes Absolute: 1.2 10*3/uL — ABNORMAL HIGH (ref 0.1–0.9)
Monocytes: 10 %
Neutrophils Absolute: 6.6 10*3/uL (ref 1.4–7.0)
Neutrophils: 57 %
Platelets: 224 10*3/uL (ref 150–450)
RBC: 4.52 x10E6/uL (ref 4.14–5.80)
RDW: 12.2 % (ref 11.6–15.4)
WBC: 11.6 10*3/uL — ABNORMAL HIGH (ref 3.4–10.8)

## 2021-07-17 LAB — CMP14+EGFR
ALT: 22 IU/L (ref 0–44)
AST: 18 IU/L (ref 0–40)
Albumin/Globulin Ratio: 1.5 (ref 1.2–2.2)
Albumin: 4.5 g/dL (ref 3.8–4.8)
Alkaline Phosphatase: 97 IU/L (ref 44–121)
BUN/Creatinine Ratio: 18 (ref 10–24)
BUN: 21 mg/dL (ref 8–27)
Bilirubin Total: 0.6 mg/dL (ref 0.0–1.2)
CO2: 25 mmol/L (ref 20–29)
Calcium: 9.3 mg/dL (ref 8.6–10.2)
Chloride: 98 mmol/L (ref 96–106)
Creatinine, Ser: 1.14 mg/dL (ref 0.76–1.27)
Globulin, Total: 3.1 g/dL (ref 1.5–4.5)
Glucose: 248 mg/dL — ABNORMAL HIGH (ref 70–99)
Potassium: 4.2 mmol/L (ref 3.5–5.2)
Sodium: 139 mmol/L (ref 134–144)
Total Protein: 7.6 g/dL (ref 6.0–8.5)
eGFR: 71 mL/min/{1.73_m2} (ref >59)

## 2021-07-17 LAB — VITAMIN D 25 HYDROXY (VIT D DEFICIENCY, FRACTURES): Vit D, 25-Hydroxy: 45.6 ng/mL (ref 30.0–100.0)

## 2021-07-17 LAB — THYROID PANEL WITH TSH
Free Thyroxine Index: 3.1 (ref 1.2–4.9)
T3 Uptake Ratio: 29 % (ref 24–39)
T4, Total: 10.6 ug/dL (ref 4.5–12.0)
TSH: 0.073 u[IU]/mL — ABNORMAL LOW (ref 0.450–4.500)

## 2021-07-17 NOTE — Progress Notes (Signed)
Returning call.

## 2021-07-20 ENCOUNTER — Other Ambulatory Visit: Payer: Self-pay | Admitting: Family Medicine

## 2021-07-20 DIAGNOSIS — I4821 Permanent atrial fibrillation: Secondary | ICD-10-CM

## 2021-08-04 NOTE — Progress Notes (Signed)
Cardiology Office Note   Date:  08/05/2021   ID:  Richard Crawford, DOB [birth year], MRN [id number]  PCP:  Baruch Gouty, FNP  Cardiologist:   Minus Breeding, MD   Chief Complaint  Patient presents with   Atrial Fibrillation      History of Present Illness: Richard Crawford is a 65 y.o. male who presents for follow up of atrial fib.  He has had this for years and was treated in [place].   He has been treated with Betapace.  It does not sound like he is had other management and he was never offered ablation.  However, he has had rate control and anticoagulation. At my first visit with him he was very fatigued so I switched from beta blocker to Cardizem.    Since I last saw him he has had some increased dyspnea.  He notices this more when he is walking up stairs for laundry.  He is not describing any new PND or orthopnea.  He is not having any new palpitations, presyncope or syncope.  He really does not feel his fibrillation.  He does not have any chest pressure, neck or arm discomfort.  He has chronic lower extremity swelling.   Past Medical History:  Diagnosis Date   Anxiety    Arthritis    left shoulder   Atrial fibrillation ([place])    Cancer ([place]) 1989   thyroid   Chronic heart failure with preserved ejection fraction (HCC)    Diabetes mellitus without complication ([place])    Hemorrhoids    external   Hx of colonic polyps    Hyperlipidemia    Hypertension    Low testosterone 12/14/2020   Migraine    Papillary thyroid carcinoma (HCC)    Postoperative hypothyroidism    Primary osteoarthritis of left shoulder    Sleep apnea    Venous stasis dermatitis of both lower extremities     Past Surgical History:  Procedure Laterality Date   ANKLE FUSION     KNEE ARTHROSCOPY     SHOULDER ARTHROSCOPY     TOTAL SHOULDER REPLACEMENT Right      Current Outpatient Medications  Medication Sig Dispense Refill   atorvastatin (LIPITOR) 20 MG tablet Take 1 tablet (20 mg total) by mouth  daily. 90 tablet 1   celecoxib (CELEBREX) 200 MG capsule TAKE  (1)  CAPSULE  TWICE DAILY. (Patient taking differently: Take one capsule once daily) 180 capsule 2   furosemide (LASIX) 80 MG tablet Take 1 tablet (80 mg total) by mouth daily. 90 tablet 1   gabapentin (NEURONTIN) 100 MG capsule TAKE 3 CAPSULES THREE TIMES DAILY 270 capsule 3   levothyroxine (SYNTHROID) 200 MCG tablet TAKE 1 TABLET (200 MCG TOTAL) BY MOUTH DAILY BEFORE BREAKFAST (TAKE IN ADDITION TO 50MCG TABLET) 30 tablet 0   levothyroxine (SYNTHROID) 50 MCG tablet TAKE 1 TABLET DAILY BEFORE BREAKFAST. (TAKE IN ADDITION TO 200MCG TABLET FOR TOTAL DOSE 250 MCG) 30 tablet 0   metFORMIN (GLUCOPHAGE-XR) 750 MG 24 hr tablet TAKE 1 TABLET DAILY 90 tablet 0   metoprolol tartrate (LOPRESSOR) 100 MG tablet TAKE (2) TABLETS DAILY 180 tablet 1   Multiple Vitamin (MULTIVITAMIN) tablet Take 1 tablet by mouth 2 (two) times daily.     rivaroxaban (XARELTO) 20 MG TABS tablet Take 1 tablet (20 mg total) by mouth daily with supper. 90 tablet 0   Semaglutide (RYBELSUS) 7 MG TABS Take 7 mg by mouth daily. 30 tablet 0   spironolactone (ALDACTONE)  25 MG tablet Take 1 tablet (25 mg total) by mouth daily. 90 tablet 1   valsartan (DIOVAN) 80 MG tablet Take 1 tablet (80 mg total) by mouth daily. 90 tablet 3   glucose blood test strip Check BS daily and as needed Dx E11.9 100 each 3   Prodigy Lancets 28G MISC Check BS daily and as needed Dx E11.9 100 each 3   No current facility-administered medications for this visit.    Allergies:   Morphine and related    ROS:  Please see the history of present illness.   Otherwise, review of systems are positive for none.   All other systems are reviewed and negative.    PHYSICAL EXAM: VS:  BP 120/70   Pulse 98   Ht 6\' 3"  (1.905 m)   Wt (!) 385 lb (174.6 kg)   BMI 48.12 kg/m  , BMI Body mass index is 48.12 kg/m. GENERAL:  Well appearing NECK:  No jugular venous distention, waveform within normal limits,  carotid upstroke brisk and symmetric, no bruits, no thyromegaly LUNGS:  Clear to auscultation bilaterally CHEST:  Unremarkable HEART:  PMI not displaced or sustained,S1 and S2 within normal limits, no S3, no clicks, no rubs, no murmurs, irregular ABD:  Flat, positive bowel sounds normal in frequency in pitch, no bruits, no rebound, no guarding, no midline pulsatile mass, no hepatomegaly, no splenomegaly EXT:  2 plus pulses throughout, moderate left greater than right no event presenting not worse right knee 15 pain but may not be better but its not worse additional physical edema, no cyanosis no clubbing   EKG:  EKG is  ordered today. The ekg ordered today demonstrates atrial fibrillation, rate 98, axis within normal limits, QTC prolonged, low voltage.  No change from previous   Recent Labs: 07/16/2021: ALT 22; BUN 21; Creatinine, Ser 1.14; Hemoglobin 13.3; Platelets 224; Potassium 4.2; Sodium 139; TSH 0.073    Lipid Panel    Component Value Date/Time   CHOL 85 (L) 07/16/2021 1434   TRIG 86 07/16/2021 1434   HDL 38 (L) 07/16/2021 1434   CHOLHDL 2.2 07/16/2021 1434   LDLCALC 30 07/16/2021 1434      Wt Readings from Last 3 Encounters:  08/05/21 (!) 385 lb (174.6 kg)  07/16/21 (!) 380 lb (172.4 kg)  07/08/21 (!) 385 lb (174.6 kg)      Other studies Reviewed: Additional studies/ records that were reviewed today include: Labs. Review of the above records demonstrates: See elsewhere   ASSESSMENT AND PLAN:  ATRIAL FIB:   He is in chronic atrial fibrillation.   Mr. [name] has a CHA2DS2 - VASc score of 3.  He tolerates anticoagulation and has had good rate control on his watch.  No change in therapy.  HTN:  The blood pressure is well controlled.  No change in therapy.    DYSLIPIDEMIA:    LDL was 30 with an HDL of 38.  No change in therapy.   SLEEP APNEA:   He wears a CPAP.  CHRONIC DIASTOLIC HF:    He may have some exacerbation of his chronic diastolic heart failure.  I  am going to check an echocardiogram as he has had moderate LVH before with a well-preserved ejection fraction.  I am going to switch him from Lasix to torsemide 40 mg daily to see if this helps.  FATIGUE:     This has been a chronic problem that he blames on underdosing of his Synthroid although he is at  an appropriate TSH.    Current medicines are reviewed at length with the patient today.  The patient does not have concerns regarding medicines.  The following changes have been made: As above  Labs/ tests ordered today include: None  No orders of the defined types were placed in this encounter.    Disposition:   FU with me in 6 months.    Signed, Minus Breeding, MD  08/05/2021 3:20 PM    [facility]

## 2021-08-05 ENCOUNTER — Other Ambulatory Visit: Payer: Self-pay

## 2021-08-05 ENCOUNTER — Ambulatory Visit (INDEPENDENT_AMBULATORY_CARE_PROVIDER_SITE_OTHER): Payer: Medicare Other | Admitting: Cardiology

## 2021-08-05 ENCOUNTER — Encounter: Payer: Self-pay | Admitting: Cardiology

## 2021-08-05 VITALS — BP 120/70 | HR 98 | Ht 75.0 in | Wt 385.0 lb

## 2021-08-05 DIAGNOSIS — E785 Hyperlipidemia, unspecified: Secondary | ICD-10-CM | POA: Diagnosis not present

## 2021-08-05 DIAGNOSIS — R5383 Other fatigue: Secondary | ICD-10-CM | POA: Diagnosis not present

## 2021-08-05 DIAGNOSIS — I5032 Chronic diastolic (congestive) heart failure: Secondary | ICD-10-CM

## 2021-08-05 DIAGNOSIS — I10 Essential (primary) hypertension: Secondary | ICD-10-CM | POA: Diagnosis not present

## 2021-08-05 MED ORDER — TORSEMIDE 40 MG PO TABS: 40.0000 mg | ORAL_TABLET | Freq: Every day | ORAL | 3 refills | Status: DC

## 2021-08-05 NOTE — Patient Instructions (Signed)
Medication Instructions:  Please discontinue your Furosemide and start Torsemide 40 mg once a day. Continue all other medications as listed.  *If you need a refill on your cardiac medications before your next appointment, please call your pharmacy*  Testing/Procedures: Your physician has requested that you have an echocardiogram at [facility]. Echocardiography is a painless test that uses sound waves to create images of your heart. It provides your doctor with information about the size and shape of your heart and how well your heart's chambers and valves are working. This procedure takes approximately one hour. There are no restrictions for this procedure.  YOu will be contacted to be scheduled for this testing.  Follow-Up: At [facility], you and your health needs are our priority.  As part of our continuing mission to provide you with exceptional heart care, we have created designated Provider Care Teams.  These Care Teams include your primary Cardiologist (physician) and Advanced Practice Providers (APPs -  Physician Assistants and Nurse Practitioners) who all work together to provide you with the care you need, when you need it.  We recommend signing up for the patient portal called "MyChart".  Sign up information is provided on this After Visit Summary.  MyChart is used to connect with patients for Virtual Visits (Telemedicine).  Patients are able to view lab/test results, encounter notes, upcoming appointments, etc.  Non-urgent messages can be sent to your provider as well.   To learn more about what you can do with MyChart, go to [email or web address].    Your next appointment:   6 month(s)  The format for your next appointment:   In Person  Provider:   Minus Breeding, MD   Thank you for choosing [facility]!!

## 2021-08-18 ENCOUNTER — Other Ambulatory Visit: Payer: Self-pay | Admitting: Family Medicine

## 2021-08-18 DIAGNOSIS — E1141 Type 2 diabetes mellitus with diabetic mononeuropathy: Secondary | ICD-10-CM

## 2021-08-26 ENCOUNTER — Other Ambulatory Visit: Payer: Self-pay

## 2021-08-26 ENCOUNTER — Ambulatory Visit (HOSPITAL_COMMUNITY): Payer: Medicare Other | Attending: Cardiovascular Disease

## 2021-08-26 DIAGNOSIS — R5383 Other fatigue: Secondary | ICD-10-CM | POA: Insufficient documentation

## 2021-08-26 DIAGNOSIS — I5032 Chronic diastolic (congestive) heart failure: Secondary | ICD-10-CM | POA: Insufficient documentation

## 2021-08-26 DIAGNOSIS — I10 Essential (primary) hypertension: Secondary | ICD-10-CM | POA: Diagnosis present

## 2021-08-26 LAB — ECHOCARDIOGRAM COMPLETE: S' Lateral: 3.8 cm

## 2021-09-04 ENCOUNTER — Other Ambulatory Visit: Payer: Self-pay | Admitting: Family Medicine

## 2021-09-04 DIAGNOSIS — E119 Type 2 diabetes mellitus without complications: Secondary | ICD-10-CM

## 2021-09-12 LAB — HM DIABETES EYE EXAM

## 2021-09-17 ENCOUNTER — Other Ambulatory Visit: Payer: Self-pay | Admitting: Nurse Practitioner

## 2021-09-17 DIAGNOSIS — E1169 Type 2 diabetes mellitus with other specified complication: Secondary | ICD-10-CM

## 2021-09-27 IMAGING — CT CT ABD-PELV W/O CM
2 of 4 series · 13 of 36 positions shown, 16 images · non-contrast
Comparison: Prior radiograph from 08/03/2019.

CLINICAL DATA: Initial evaluation for fever of unknown origin,
hematuria.

EXAM:
CT CHEST, ABDOMEN AND PELVIS WITHOUT CONTRAST
TECHNIQUE: Multidetector CT imaging of the chest, abdomen and pelvis was
performed following the standard protocol without IV contrast.

[Series 2: cap without · axial · non-contrast · 0.98mm/px · z∈[+652,+1258]mm · 10 of 147 slices shown, 13 images]
[im 13/147  mediastinal]
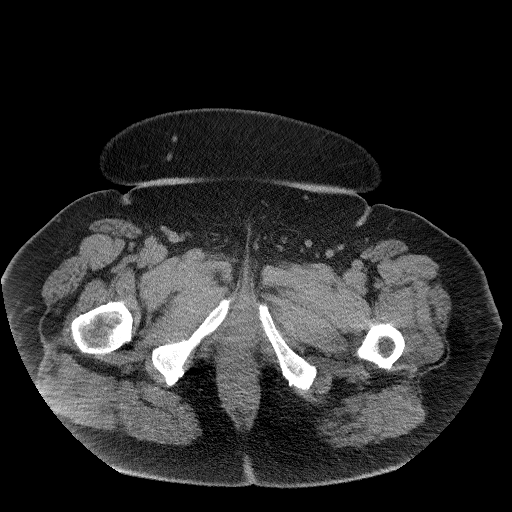
[im 13/147  lung]
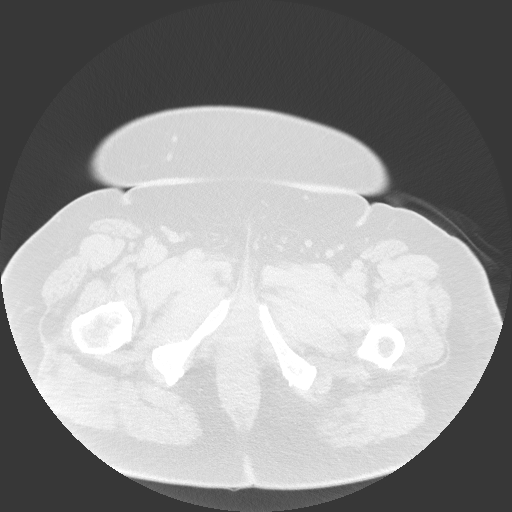
[im 25/147  lung]
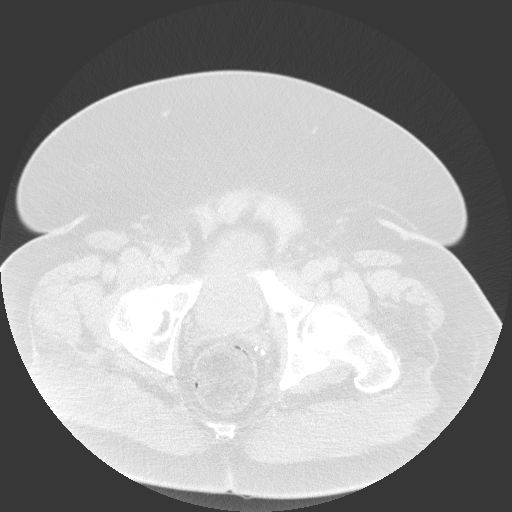
[im 37/147  lung]
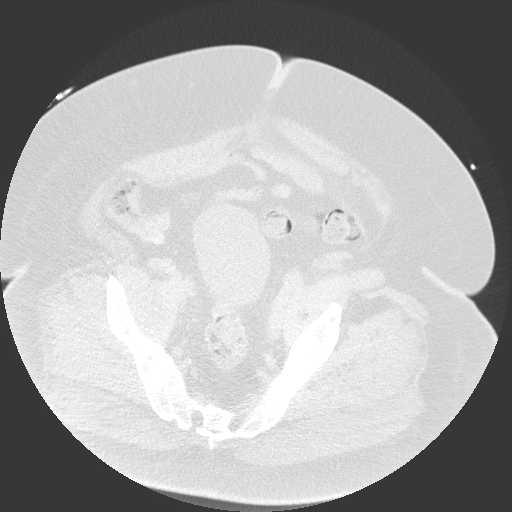
[im 49/147  lung]
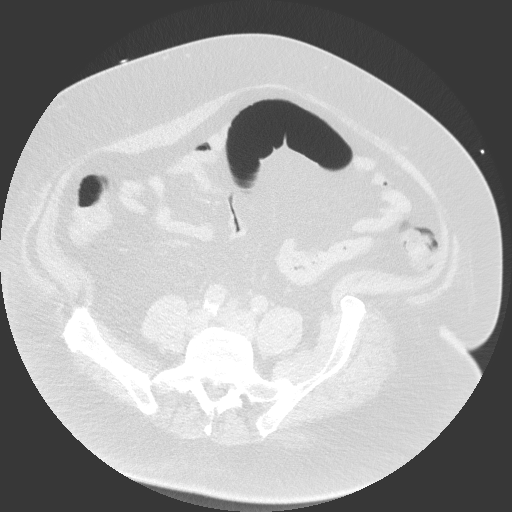
[im 61/147  mediastinal]
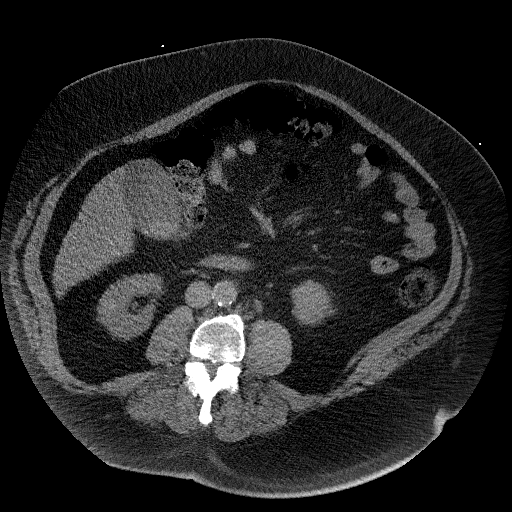
[im 61/147  lung]
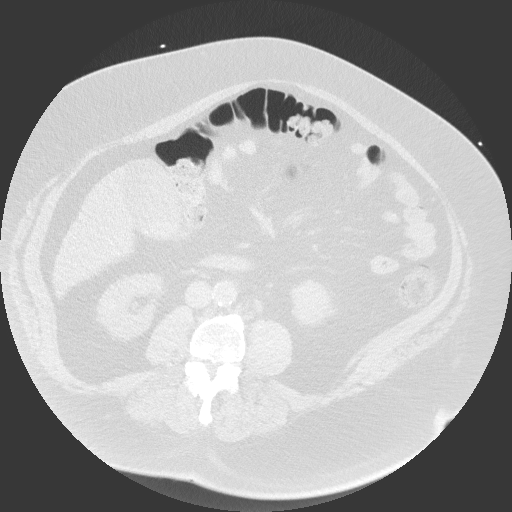
[im 86/147  lung]
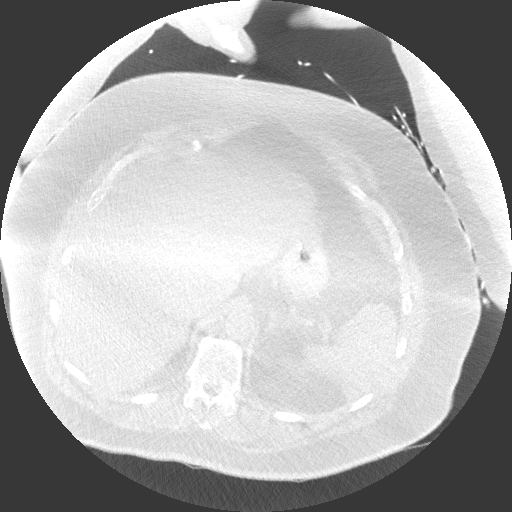
[im 98/147  lung]
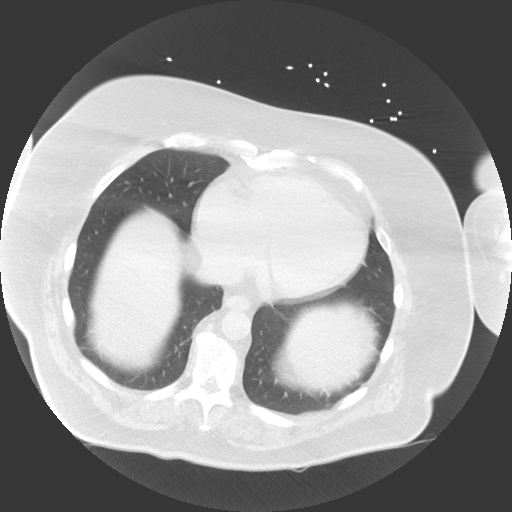
[im 110/147  lung]
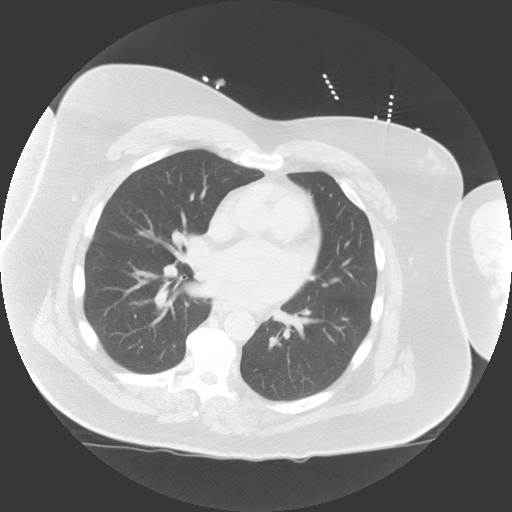
[im 122/147  mediastinal]
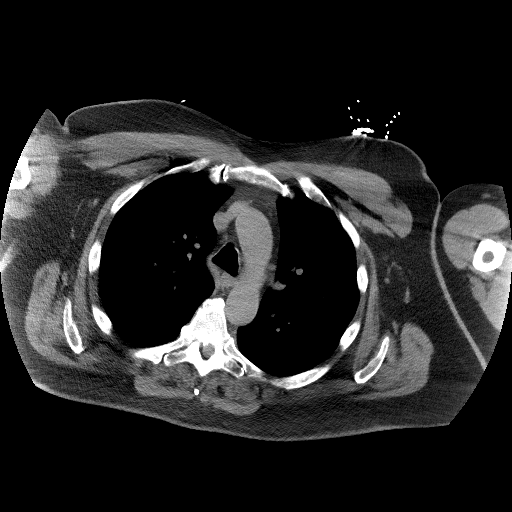
[im 122/147  lung]
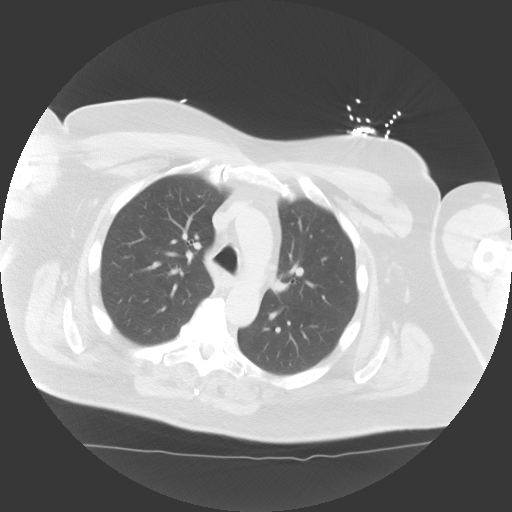
[im 134/147  lung]
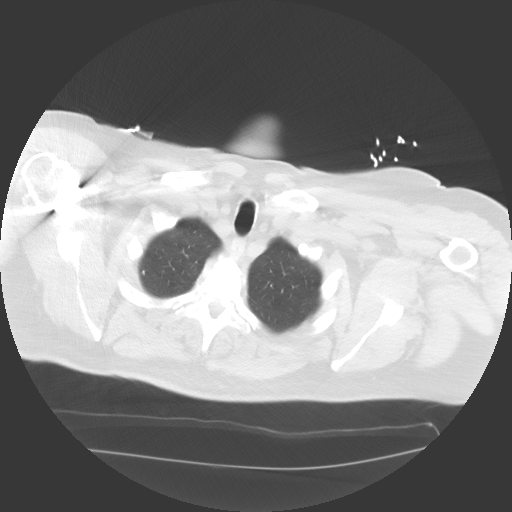

[Series 5: coronal · coronal · 1.25mm/px · 3 of 241 slices shown]
[im 49/241  lung]
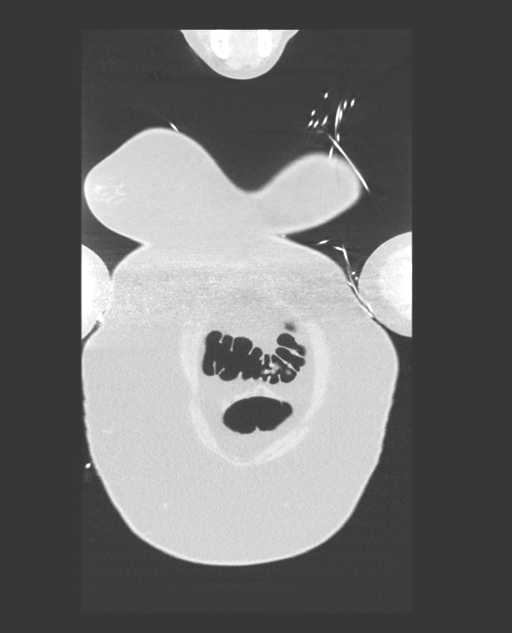
[im 97/241  lung]
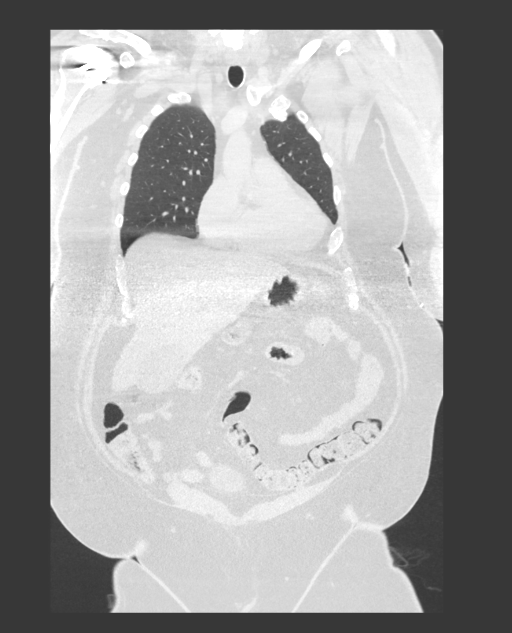
[im 145/241  lung]
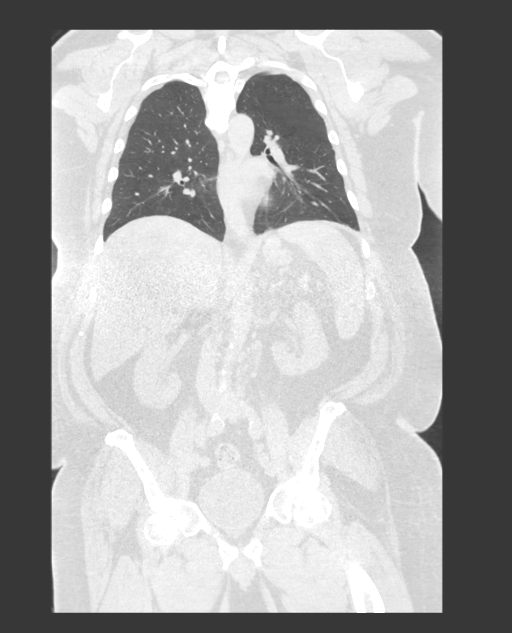

[13 of 36 positions shown; findings below may reference images not displayed]

FINDINGS: CT CHEST FINDINGS

Cardiovascular: Intrathoracic aorta of normal caliber. Partially
visualized great vessels grossly within normal limits. Heart size
normal. Coronary artery calcifications noted within the LAD. No
pericardial effusion. Limited non-contrast assessment of the
pulmonary arterial tree grossly unremarkable.

Mediastinum/Nodes: No pathologically enlarged mediastinal, hilar, or
axillary lymph nodes. No mediastinal mass. Esophagus within normal
limits.

Lungs/Pleura: Tracheobronchial tree intact and patent. Lungs are
well inflated bilaterally. No focal infiltrates. No edema or
effusion. No pneumothorax. Two small 3 mm pulmonary nodules are
seen, 1 of which positioned in the right lower lobe (series 3, image
99), the other within the left lower lobe (series 3, image 91). Few
additional scattered subcentimeter calcified granulomas noted. No
other worrisome pulmonary nodule or mass.

Musculoskeletal: External soft tissues demonstrate no acute
abnormality. Mild gynecomastia noted. Postsurgical changes present
at the right shoulder. No acute osseous abnormality. No discrete
lytic or blastic osseous lesions. Multilevel degenerative spurring
noted throughout the thoracic spine.

CT ABDOMEN PELVIS FINDINGS

Hepatobiliary: Evaluation of the liver limited by streak artifact
from overlying arms. Liver grossly within normal limits. Layering
hyperdensity within the gallbladder lumen consistent with stones
and/or sludge. No significant pericholecystic inflammatory changes
to suggest acute cholecystitis. No biliary dilatation.

Pancreas: Pancreas grossly within normal limits, although evaluation
limited by streak artifact.

Spleen: Spleen within normal limits.

Adrenals/Urinary Tract: Adrenal glands grossly unremarkable. Kidneys
are equal in size without nephrolithiasis or hydronephrosis. Few
scattered subtle hypodensities within the kidneys bilaterally, not
well assessed on this noncontrast examination, but statistically
likely reflects small cyst. No visible hydroureter or
ureterolithiasis. Partially distended bladder within normal limits.
No layering stones within the bladder lumen.

Stomach/Bowel: Stomach grossly unremarkable, although limited
assessment due to streak artifact. No evidence for bowel
obstruction. Normal appendix. No acute inflammatory changes seen
about the bowels. Moderate stool seen impacted within the rectal
vault, which could reflect constipation.

Vascular/Lymphatic: Moderate aorto bi-iliac atherosclerotic disease.
No aneurysm. Few mildly enlarged retroperitoneal lymph nodes noted,
measuring up to 14 mm in short axis (series 2, images 82, 84). No
other visible adenopathy.

Reproductive: Prostate within normal limits.

Other: No free air or fluid.

Musculoskeletal: No acute osseous abnormality. No discrete lytic or
blastic osseous lesions. Prominent multilevel facet arthropathy
noted throughout the lumbar spine.
IMPRESSION: 1. No acute abnormality identified within the chest, abdomen, and
pelvis.
2. Few mildly enlarged retroperitoneal lymph nodes as above,
indeterminate, but may be reactive. Attention at follow-up
recommended.
3. Cholelithiasis.
4. Moderate stool impacted within the rectal vault, which could
reflect constipation.
5. Moderate aorto bi-iliac atherosclerotic disease.  No aneurysm.
6. Few scattered 3 mm pulmonary nodules as above, indeterminate. No
follow-up needed if patient is low-risk (and has no known or
suspected primary neoplasm). Non-contrast chest CT can be considered
in 12 months if patient is high-risk. This recommendation follows
the consensus statement: Guidelines for Management of Incidental
Pulmonary Nodules Detected on CT Images: From the [HOSPITAL]

## 2021-10-16 ENCOUNTER — Ambulatory Visit: Payer: Medicare HMO | Admitting: Family Medicine

## 2021-10-16 ENCOUNTER — Encounter: Payer: Self-pay | Admitting: Family Medicine

## 2021-10-16 VITALS — BP 135/79 | HR 101 | Temp 98.7°F | Ht 75.0 in | Wt 385.0 lb

## 2021-10-16 DIAGNOSIS — R7989 Other specified abnormal findings of blood chemistry: Secondary | ICD-10-CM

## 2021-10-16 DIAGNOSIS — I4821 Permanent atrial fibrillation: Secondary | ICD-10-CM | POA: Diagnosis not present

## 2021-10-16 DIAGNOSIS — E1169 Type 2 diabetes mellitus with other specified complication: Secondary | ICD-10-CM | POA: Diagnosis not present

## 2021-10-16 DIAGNOSIS — E890 Postprocedural hypothyroidism: Secondary | ICD-10-CM | POA: Diagnosis not present

## 2021-10-16 DIAGNOSIS — I152 Hypertension secondary to endocrine disorders: Secondary | ICD-10-CM

## 2021-10-16 DIAGNOSIS — E1159 Type 2 diabetes mellitus with other circulatory complications: Secondary | ICD-10-CM

## 2021-10-16 DIAGNOSIS — E785 Hyperlipidemia, unspecified: Secondary | ICD-10-CM

## 2021-10-16 DIAGNOSIS — I5032 Chronic diastolic (congestive) heart failure: Secondary | ICD-10-CM

## 2021-10-16 LAB — BAYER DCA HB A1C WAIVED: HB A1C (BAYER DCA - WAIVED): 5.9 % — ABNORMAL HIGH (ref 4.8–5.6)

## 2021-10-16 NOTE — Progress Notes (Signed)
Subjective:  Patient ID: Richard Crawford, male    DOB: 1956/04/17, 66 y.o.   MRN: [id number]  Patient Care Team: Baruch Gouty, FNP as PCP - General (Family Medicine) Minus Breeding, MD as PCP - Cardiology (Cardiology)   Chief Complaint:  3 month follow up   HPI: Richard Crawford is a 66 y.o. male presenting on 10/16/2021 for 3 month follow up   Patient presents today for management of chronic conditions.  He is a type II diabetic with associated hypertension, hyperlipidemia, morbid obesity, retinopathy and neuropathy.  He reports his blood sugars are well controlled, never over 150.  He denies any visual changes and had a recent eye exam with Dr. Winona Legato at [facility].  No polyuria, polyphagia, or polydipsia.  He does have neuropathy and states this is well controlled with gabapentin.  He has permanent A. fib and is rate controlled on Lopressor, he is on Xarelto for anticoagulation.  No chest pain, palpitations, shortness of breath, dizziness, or syncope.  He denies abnormal bleeding or bruising.  He reports blood pressures well controlled.  No headaches or visual changes.  He does have some slight lower extremity swelling, nothing more than usual.  He was recently seen by cardiology and echo was repeated revealing an EF of 55 to 60%.  He did have right and left atrial enlargement.  His biggest concern with his Synthroid dose.  He reports feeling his best when he was taking 400 mcg daily.  He states he just feels worn down and sluggish.  States that he was in much better health when his Synthroid was at a higher dose.  Discussed low testosterone levels and symptoms associated with low testosterone reports.  Patient states he was on testosterone replacement therapy when he was in [place] and this did not improve symptoms.  He does eat fairly healthy but does not exercise on a regular basis.  Request   Relevant past medical, surgical, family, and social history reviewed and updated as  indicated.  Allergies and medications reviewed and updated. Data reviewed: Chart in Epic.   Past Medical History:  Diagnosis Date   Anxiety    Arthritis    left shoulder   Atrial fibrillation ([place])    Cancer ([place]) 1989   thyroid   Chronic heart failure with preserved ejection fraction (HCC)    Diabetes mellitus without complication (HCC)    Hemorrhoids    external   Hx of colonic polyps    Hyperlipidemia    Hypertension    Low testosterone 12/14/2020   Migraine    Papillary thyroid carcinoma (HCC)    Postoperative hypothyroidism    Primary osteoarthritis of left shoulder    Sleep apnea    Venous stasis dermatitis of both lower extremities     Past Surgical History:  Procedure Laterality Date   ANKLE FUSION     KNEE ARTHROSCOPY     SHOULDER ARTHROSCOPY     TOTAL SHOULDER REPLACEMENT Right     Social History   Socioeconomic History   Marital status: Married    Spouse name: Not on file   Number of children: Not on file   Years of education: Not on file   Highest education level: Not on file  Occupational History   Occupation: retired  Tobacco Use   Smoking status: Never   Smokeless tobacco: Never  Substance and Sexual Activity   Alcohol use: Not Currently   Drug use: Never   Sexual activity: Not  on file  Other Topics Concern   Not on file  Social History Narrative   Lives at home with wife - Originally from [place]; moved here so wife could be closer to her grandchildren (second wife)   Social Determinants of Radio broadcast assistant Strain: Low Risk    Difficulty of Paying Living Expenses: Not hard at all  Food Insecurity: No Food Insecurity   Worried About Charity fundraiser in the Last Year: Never true   Arboriculturist in the Last Year: Never true  Transportation Needs: No Transportation Needs   Lack of Transportation (Medical): No   Lack of Transportation (Non-Medical): No  Physical Activity: Insufficiently Active   Days of Exercise per  Week: 3 days   Minutes of Exercise per Session: 30 min  Stress: No Stress Concern Present   Feeling of Stress : Not at all  Social Connections: Moderately Isolated   Frequency of Communication with Friends and Family: Three times a week   Frequency of Social Gatherings with Friends and Family: Once a week   Attends Religious Services: Never   Marine scientist or Organizations: No   Attends Music therapist: Never   Marital Status: Married  Human resources officer Violence: Not At Risk   Fear of Current or Ex-Partner: No   Emotionally Abused: No   Physically Abused: No   Sexually Abused: No    Outpatient Encounter Medications as of 10/16/2021  Medication Sig   atorvastatin (LIPITOR) 20 MG tablet TAKE 1 TABLET DAILY   celecoxib (CELEBREX) 200 MG capsule TAKE  (1)  CAPSULE  TWICE DAILY. (Patient taking differently: Take one capsule once daily)   gabapentin (NEURONTIN) 100 MG capsule TAKE 3 CAPSULES THREE TIMES DAILY   glucose blood test strip Check BS daily and as needed Dx E11.9   levothyroxine (SYNTHROID) 200 MCG tablet TAKE 1 TABLET (200 MCG TOTAL) BY MOUTH DAILY BEFORE BREAKFAST (TAKE IN ADDITION TO 50MCG TABLET)   levothyroxine (SYNTHROID) 50 MCG tablet TAKE 1 TABLET DAILY BEFORE BREAKFAST. (TAKE IN ADDITION TO 200MCG TABLET FOR TOTAL DOSE 250 MCG)   metFORMIN (GLUCOPHAGE-XR) 750 MG 24 hr tablet TAKE 1 TABLET DAILY   metoprolol tartrate (LOPRESSOR) 100 MG tablet TAKE (2) TABLETS DAILY   Multiple Vitamin (MULTIVITAMIN) tablet Take 1 tablet by mouth 2 (two) times daily.   Prodigy Lancets 28G MISC Check BS daily and as needed Dx E11.9   rivaroxaban (XARELTO) 20 MG TABS tablet Take 1 tablet (20 mg total) by mouth daily with supper.   Semaglutide (RYBELSUS) 7 MG TABS TAKE ONE TABLET ONCE DAILY   spironolactone (ALDACTONE) 25 MG tablet Take 1 tablet (25 mg total) by mouth daily.   torsemide 40 MG TABS Take 40 mg by mouth daily.   valsartan (DIOVAN) 80 MG tablet Take 1 tablet  (80 mg total) by mouth daily.   No facility-administered encounter medications on file as of 10/16/2021.    Allergies  Allergen Reactions   Morphine And Related Nausea And Vomiting and Nausea Only    Review of Systems  Constitutional:  Positive for activity change, appetite change and fatigue. Negative for chills, diaphoresis, fever and unexpected weight change.  Eyes:  Negative for photophobia and visual disturbance.  Respiratory:  Negative for apnea, cough, choking, chest tightness, shortness of breath, wheezing and stridor.   Cardiovascular:  Positive for leg swelling. Negative for chest pain and palpitations.  Gastrointestinal:  Negative for abdominal pain, constipation, diarrhea, nausea and vomiting.  Endocrine: Negative for cold intolerance, heat intolerance, polydipsia, polyphagia and polyuria.  Genitourinary:  Negative for decreased urine volume, difficulty urinating, penile discharge, penile pain, penile swelling and testicular pain.  Musculoskeletal:  Positive for arthralgias, back pain and myalgias.  Neurological:  Negative for dizziness, tremors, seizures, syncope, facial asymmetry, speech difficulty, weakness, light-headedness, numbness and headaches.  Psychiatric/Behavioral:  Negative for confusion.   All other systems reviewed and are negative.      Objective:  BP 135/79    Pulse (!) 101    Temp 98.7 F (37.1 C)    Ht 6\' 3"  (1.905 m)    Wt (!) 385 lb (174.6 kg)    SpO2 95%    BMI 48.12 kg/m    Wt Readings from Last 3 Encounters:  10/16/21 (!) 385 lb (174.6 kg)  08/05/21 (!) 385 lb (174.6 kg)  07/16/21 (!) 380 lb (172.4 kg)    Physical Exam Vitals and nursing note reviewed.  Constitutional:      General: He is not in acute distress.    Appearance: Normal appearance. He is morbidly obese. He is not ill-appearing, toxic-appearing or diaphoretic.  HENT:     Head: Normocephalic and atraumatic.     Mouth/Throat:     Mouth: Mucous membranes are moist.  Eyes:      Conjunctiva/sclera: Conjunctivae normal.     Pupils: Pupils are equal, round, and reactive to light.  Cardiovascular:     Rate and Rhythm: Normal rate. Rhythm regularly irregular.     Heart sounds: Murmur heard.  Systolic murmur is present with a grade of 1/6.    No S3 or S4 sounds.  Pulmonary:     Effort: Pulmonary effort is normal.     Breath sounds: Normal breath sounds.  Abdominal:     General: Bowel sounds are normal.     Palpations: Abdomen is soft.  Musculoskeletal:     Cervical back: Neck supple.     Right lower leg: 1+ Edema present.     Left lower leg: 1+ Edema present.  Skin:    General: Skin is warm and dry.     Capillary Refill: Capillary refill takes less than 2 seconds.  Neurological:     General: No focal deficit present.     Mental Status: He is alert and oriented to person, place, and time.     Gait: Gait normal.  Psychiatric:        Mood and Affect: Mood normal.        Behavior: Behavior normal.        Thought Content: Thought content normal.        Judgment: Judgment normal.    Results for orders placed or performed in visit on 10/16/21  Bayer DCA Hb A1c Waived  Result Value Ref Range   HB A1C (BAYER DCA - WAIVED) 5.9 (H) 4.8 - 5.6 %       Pertinent labs & imaging results that were available during my care of the patient were reviewed by me and considered in my medical decision making.  Assessment & Plan:  O[place]ond was seen today for 3 month follow up.  Diagnoses and all orders for this visit:  Type 2 diabetes mellitus with other specified complication, without long-term current use of insulin ([place]) A1C 5.9 in office. Keep up the great work. May be able to decrease medications if pt remains well controlled. Diet and exercise encouraged.  -     Bayer DCA Hb A1c Waived  Postoperative hypothyroidism  Feels his repletion dose is inadequate.  We will recheck labs today.  Patient is followed by endocrinology and has not had any recent dose adjustments. -      Thyroid Panel With TSH  Low testosterone Discussed repletion therapy.  Patient will speak to wife and determine if he would like to initiate repletion therapy as testosterone was very low.  Permanent atrial fibrillation ([place]) Followed by cardiology regular basis.  On Xarelto and beta-blocker.  Hypertension associated with diabetes ([place]) Well-controlled on current regimen.  No adjustments. DASH diet and exercise encouraged.  Chronic heart failure with preserved ejection fraction (HCC) Recent echo with normal EF, Right and left atrial enlargement. No significant swelling in office. Cardiology did add torsemide to regimen.   Morbid obesity ([place]) Diet and exercise encouraged.   Hyperlipidemia associated with type 2 diabetes mellitus (HCC) Last lipid panel acceptable. Continue statin therapy along with diet and exercise.     Continue all other maintenance medications.  Follow up plan: Return in about 3 months (around 01/14/2022), or if symptoms worsen or fail to improve, for DM, testosterone.   Continue healthy lifestyle choices, including diet (rich in fruits, vegetables, and lean proteins, and low in salt and simple carbohydrates) and exercise (at least 30 minutes of moderate physical activity daily).  Educational handout given for DM  The above assessment and management plan was discussed with the patient. The patient verbalized understanding of and has agreed to the management plan. Patient is aware to call the clinic if they develop any new symptoms or if symptoms persist or worsen. Patient is aware when to return to the clinic for a follow-up visit. Patient educated on when it is appropriate to go to the emergency department.   Monia Pouch, FNP-C [facility] [phone]

## 2021-10-16 NOTE — Patient Instructions (Addendum)

## 2021-10-17 ENCOUNTER — Encounter: Payer: Self-pay | Admitting: Family Medicine

## 2021-10-17 LAB — THYROID PANEL WITH TSH
Free Thyroxine Index: 2.8 (ref 1.2–4.9)
T3 Uptake Ratio: 27 % (ref 24–39)
T4, Total: 10.5 ug/dL (ref 4.5–12.0)
TSH: 0.081 u[IU]/mL — ABNORMAL LOW (ref 0.450–4.500)

## 2021-10-19 ENCOUNTER — Other Ambulatory Visit: Payer: Self-pay | Admitting: Family Medicine

## 2021-10-19 DIAGNOSIS — I4821 Permanent atrial fibrillation: Secondary | ICD-10-CM

## 2021-10-23 ENCOUNTER — Other Ambulatory Visit: Payer: Self-pay | Admitting: Family Medicine

## 2021-10-23 ENCOUNTER — Encounter: Payer: Self-pay | Admitting: Family Medicine

## 2021-10-23 DIAGNOSIS — R6882 Decreased libido: Secondary | ICD-10-CM

## 2021-10-23 DIAGNOSIS — R7989 Other specified abnormal findings of blood chemistry: Secondary | ICD-10-CM

## 2021-10-23 MED ORDER — "SYRINGE/NEEDLE (DISP) 20G X 1-1/2"" 3 ML MISC": 1.0000 | 0 refills | Status: AC

## 2021-10-23 MED ORDER — TESTOSTERONE CYPIONATE 100 MG/ML IJ SOLN: 100.0000 mg | INTRAMUSCULAR | 0 refills | Status: DC

## 2021-10-26 ENCOUNTER — Telehealth: Payer: Self-pay | Admitting: Family Medicine

## 2021-10-26 NOTE — Telephone Encounter (Signed)
Rhonda called Not able to fill Testosterone Cypionate 100 MG/ML SOLN 5ML. She ordered 10ML for pt but he has not picked up yet.

## 2021-10-27 NOTE — Telephone Encounter (Signed)
Resend with QTY change - give 10 ml vial instead of 5 ml vial

## 2021-10-28 MED ORDER — TESTOSTERONE CYPIONATE 100 MG/ML IJ SOLN: 100.0000 mg | INTRAMUSCULAR | 0 refills | Status: DC

## 2021-10-28 NOTE — Addendum Note (Signed)
Addended by: Baruch Gouty on: 10/28/2021 07:54 AM   Modules accepted: Orders

## 2021-11-04 ENCOUNTER — Other Ambulatory Visit: Payer: Self-pay | Admitting: Family Medicine

## 2021-11-04 DIAGNOSIS — I5032 Chronic diastolic (congestive) heart failure: Secondary | ICD-10-CM

## 2021-11-11 ENCOUNTER — Ambulatory Visit (INDEPENDENT_AMBULATORY_CARE_PROVIDER_SITE_OTHER): Payer: Medicare HMO | Admitting: *Deleted

## 2021-11-11 DIAGNOSIS — R7989 Other specified abnormal findings of blood chemistry: Secondary | ICD-10-CM

## 2021-11-11 DIAGNOSIS — E291 Testicular hypofunction: Secondary | ICD-10-CM

## 2021-11-11 MED ORDER — TESTOSTERONE CYPIONATE 200 MG/ML IM SOLN
200.0000 mg | INTRAMUSCULAR | Status: DC
  Administered 2021-11-11 – 2022-03-04 (×9): 200 mg via INTRAMUSCULAR

## 2021-11-11 NOTE — Progress Notes (Signed)
Testosterone injection given and patient tolerated well. Patients wife was walked through on how to administer at home. Patient wife advised to call us if she has any questions on how to administer.

## 2021-11-13 ENCOUNTER — Other Ambulatory Visit: Payer: Self-pay | Admitting: Family Medicine

## 2021-11-13 DIAGNOSIS — E1141 Type 2 diabetes mellitus with diabetic mononeuropathy: Secondary | ICD-10-CM

## 2021-11-26 ENCOUNTER — Ambulatory Visit (INDEPENDENT_AMBULATORY_CARE_PROVIDER_SITE_OTHER): Payer: Medicare HMO

## 2021-11-26 DIAGNOSIS — E291 Testicular hypofunction: Secondary | ICD-10-CM | POA: Diagnosis not present

## 2021-11-26 NOTE — Progress Notes (Signed)
Testosterone injection given to left upper thigh.  Patient tolerated well.

## 2021-11-30 ENCOUNTER — Other Ambulatory Visit: Payer: Self-pay | Admitting: Family Medicine

## 2021-11-30 DIAGNOSIS — E119 Type 2 diabetes mellitus without complications: Secondary | ICD-10-CM

## 2021-12-10 ENCOUNTER — Ambulatory Visit (INDEPENDENT_AMBULATORY_CARE_PROVIDER_SITE_OTHER): Payer: Medicare HMO

## 2021-12-10 DIAGNOSIS — E291 Testicular hypofunction: Secondary | ICD-10-CM

## 2021-12-10 NOTE — Progress Notes (Signed)
Testosterone injection given to right thigh per patient request.  Patient tolerated well. 

## 2021-12-14 ENCOUNTER — Other Ambulatory Visit: Payer: Self-pay | Admitting: Family Medicine

## 2021-12-14 DIAGNOSIS — I152 Hypertension secondary to endocrine disorders: Secondary | ICD-10-CM

## 2021-12-14 DIAGNOSIS — E1169 Type 2 diabetes mellitus with other specified complication: Secondary | ICD-10-CM

## 2021-12-24 ENCOUNTER — Ambulatory Visit (INDEPENDENT_AMBULATORY_CARE_PROVIDER_SITE_OTHER): Payer: Medicare HMO

## 2021-12-24 DIAGNOSIS — E291 Testicular hypofunction: Secondary | ICD-10-CM

## 2021-12-24 NOTE — Progress Notes (Signed)
Testosterone injection given to left thigh.  Patient tolerated well. ?

## 2021-12-28 ENCOUNTER — Other Ambulatory Visit: Payer: Self-pay | Admitting: Family Medicine

## 2022-01-05 ENCOUNTER — Encounter: Payer: Self-pay | Admitting: Family Medicine

## 2022-01-07 ENCOUNTER — Other Ambulatory Visit: Payer: Self-pay | Admitting: Family Medicine

## 2022-01-07 ENCOUNTER — Ambulatory Visit (INDEPENDENT_AMBULATORY_CARE_PROVIDER_SITE_OTHER): Payer: Medicare HMO | Admitting: *Deleted

## 2022-01-07 DIAGNOSIS — E291 Testicular hypofunction: Secondary | ICD-10-CM | POA: Diagnosis not present

## 2022-01-07 DIAGNOSIS — I4821 Permanent atrial fibrillation: Secondary | ICD-10-CM

## 2022-01-07 DIAGNOSIS — E1141 Type 2 diabetes mellitus with diabetic mononeuropathy: Secondary | ICD-10-CM

## 2022-01-07 NOTE — Progress Notes (Signed)
Testosterone given and patient tolerated well. ?

## 2022-01-14 ENCOUNTER — Encounter: Payer: Self-pay | Admitting: Family Medicine

## 2022-01-14 ENCOUNTER — Ambulatory Visit: Payer: Medicare HMO | Admitting: Family Medicine

## 2022-01-14 ENCOUNTER — Ambulatory Visit (INDEPENDENT_AMBULATORY_CARE_PROVIDER_SITE_OTHER): Payer: Medicare HMO | Admitting: Family Medicine

## 2022-01-14 VITALS — BP 130/58 | HR 89 | Temp 98.1°F | Ht 75.0 in | Wt 384.8 lb

## 2022-01-14 DIAGNOSIS — E890 Postprocedural hypothyroidism: Secondary | ICD-10-CM

## 2022-01-14 DIAGNOSIS — R7989 Other specified abnormal findings of blood chemistry: Secondary | ICD-10-CM

## 2022-01-14 DIAGNOSIS — E1141 Type 2 diabetes mellitus with diabetic mononeuropathy: Secondary | ICD-10-CM | POA: Diagnosis not present

## 2022-01-14 DIAGNOSIS — N521 Erectile dysfunction due to diseases classified elsewhere: Secondary | ICD-10-CM

## 2022-01-14 DIAGNOSIS — E1169 Type 2 diabetes mellitus with other specified complication: Secondary | ICD-10-CM | POA: Insufficient documentation

## 2022-01-14 DIAGNOSIS — I5032 Chronic diastolic (congestive) heart failure: Secondary | ICD-10-CM

## 2022-01-14 DIAGNOSIS — I152 Hypertension secondary to endocrine disorders: Secondary | ICD-10-CM

## 2022-01-14 DIAGNOSIS — E1159 Type 2 diabetes mellitus with other circulatory complications: Secondary | ICD-10-CM

## 2022-01-14 DIAGNOSIS — E785 Hyperlipidemia, unspecified: Secondary | ICD-10-CM

## 2022-01-14 LAB — BAYER DCA HB A1C WAIVED: HB A1C (BAYER DCA - WAIVED): 6.2 % — ABNORMAL HIGH (ref 4.8–5.6)

## 2022-01-14 MED ORDER — SILDENAFIL CITRATE 100 MG PO TABS: 50.0000 mg | ORAL_TABLET | Freq: Every day | ORAL | 11 refills | Status: DC | PRN

## 2022-01-14 MED ORDER — ATORVASTATIN CALCIUM 20 MG PO TABS: 20.0000 mg | ORAL_TABLET | Freq: Every day | ORAL | 2 refills | Status: DC

## 2022-01-14 MED ORDER — GABAPENTIN 100 MG PO CAPS: 100.0000 mg | ORAL_CAPSULE | Freq: Three times a day (TID) | ORAL | 2 refills | Status: DC

## 2022-01-14 MED ORDER — METOPROLOL TARTRATE 100 MG PO TABS: ORAL_TABLET | ORAL | 2 refills | Status: DC

## 2022-01-14 MED ORDER — VALSARTAN 80 MG PO TABS: 80.0000 mg | ORAL_TABLET | Freq: Every day | ORAL | 2 refills | Status: DC

## 2022-01-14 MED ORDER — SPIRONOLACTONE 25 MG PO TABS: 25.0000 mg | ORAL_TABLET | Freq: Every day | ORAL | 2 refills | Status: DC

## 2022-01-14 MED ORDER — METFORMIN HCL ER 750 MG PO TB24: 750.0000 mg | ORAL_TABLET | Freq: Every day | ORAL | 2 refills | Status: DC

## 2022-01-14 MED ORDER — RYBELSUS 7 MG PO TABS: 1.0000 | ORAL_TABLET | Freq: Every day | ORAL | 3 refills | Status: DC

## 2022-01-14 NOTE — Progress Notes (Signed)
?  ? ?Subjective:  ?Patient ID: Dominick Morella, male    DOB: 05-01-1956, 66 y.o.   MRN: [id number] ? ?Patient Care Team: ?Baruch Gouty, FNP as PCP - General (Family Medicine) ?Minus Breeding, MD as PCP - Cardiology (Cardiology)  ? ?Chief Complaint:  Medical Management of Chronic Issues ? ? ?HPI: ?Chijioke Lasser is a 66 y.o. male presenting on 01/14/2022 for Medical Management of Chronic Issues ? ? ?Pt presents today for management of chronic medical conditions but has several other concerns he would like to address today. He has diabetes with associated neuropathy, hypertension, hyperlipidemia, and ED. He is compliant with his medications and reports good blood sugar readings at home. He denies polyuria, polyphagia, or polydipsia. 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 He has started on testosterone repletion therapy and states this has not increased his stamina or overall mood. States he continues to have a hard time with erectile dysfunction, has been on Viagra in the past and tolerated well. He is not on NTG and denies any anginal symptoms. His biggest concern is his synthroid dosing. He was previously on 400 mcg per pts report. He has been referred to endocrinology and they have declined to increase dose as thyroid panel has been unremarkable so he decided not to go back. He states he just wants to feel better and attributes all of his current health issues with his thyroid disorder. Discussed proper repletion therapy for hypothyroidism in detail. Aware that over repletion can lead to serious issues. Proper dosing guidelines will be followed.  ?He continues to have low back stiffness when he gets up in the morning, states after 2 tylenol this resolves. He has ongoing left ankle pain from prior injury/surgery.  ? ? ? ?Relevant past medical, surgical, family, and social history reviewed and updated as indicated.  ?Allergies and medications reviewed and updated.  Data reviewed: Chart in Epic. ? ? ?Past Medical History:  ?Diagnosis Date  ? Anxiety   ? Arthritis   ? left shoulder  ? Atrial fibrillation ([place])   ? Cancer [facility]) 1989  ? thyroid  ? Chronic heart failure with preserved ejection fraction ([place])   ? Diabetes mellitus without complication ([place])   ? Hemorrhoids   ? external  ? Hx of colonic polyps   ? Hyperlipidemia   ? Hypertension   ? Low testosterone 12/14/2020  ? Migraine   ? Papillary thyroid carcinoma ([place])   ? Postoperative hypothyroidism   ? Primary osteoarthritis of left shoulder   ? Sleep apnea   ? Venous stasis dermatitis of both lower extremities   ? ? ?Past Surgical History:  ?Procedure Laterality Date  ? ANKLE FUSION    ? KNEE ARTHROSCOPY    ? SHOULDER ARTHROSCOPY    ? TOTAL SHOULDER REPLACEMENT Right   ? ? ?Social History  ? ?Socioeconomic History  ? Marital status: Married  ?  Spouse name: Not on file  ? Number of children: Not on file  ? Years of education: Not on file  ? Highest education level: Not on file  ?Occupational History  ? Occupation: retired  ?Tobacco Use  ? Smoking status: Never  ? Smokeless tobacco: Never  ?Substance and Sexual Activity  ? Alcohol use: Not Currently  ? Drug use: Never  ? Sexual activity: Not on file  ?Other Topics Concern  ? Not on file  ?Social History Narrative  ? Lives at home with wife - Originally from [place]; moved here  so wife could be closer to her grandchildren (second wife)  ? ?Social Determinants of Health  ? ?Financial Resource Strain: Low Risk   ? Difficulty of Paying Living Expenses: Not hard at all  ?Food Insecurity: No Food Insecurity  ? Worried About Charity fundraiser in the Last Year: Never true  ? Ran Out of Food in the Last Year: Never true  ?Transportation Needs: No Transportation Needs  ? Lack of Transportation (Medical): No  ? Lack of Transportation (Non-Medical): No  ?Physical Activity: Insufficiently Active  ? Days of Exercise per Week: 3 days  ? Minutes of Exercise per Session: 30 min   ?Stress: No Stress Concern Present  ? Feeling of Stress : Not at all  ?Social Connections: Moderately Isolated  ? Frequency of Communication with Friends and Family: Three times a week  ? Frequency of Social Gatherings with Friends and Family: Once a week  ? Attends Religious Services: Never  ? Active Member of Clubs or Organizations: No  ? Attends Archivist Meetings: Never  ? Marital Status: Married  ?Intimate Partner Violence: Not At Risk  ? Fear of Current or Ex-Partner: No  ? Emotionally Abused: No  ? Physically Abused: No  ? Sexually Abused: No  ? ? ?Outpatient Encounter Medications as of 01/14/2022  ?Medication Sig  ? celecoxib (CELEBREX) 200 MG capsule TAKE  (1)  CAPSULE  TWICE DAILY. (Patient taking differently: Take one capsule once daily)  ? glucose blood test strip Check BS daily and as needed Dx E11.9  ? levothyroxine (SYNTHROID) 200 MCG tablet TAKE 1 TABLET (200 MCG TOTAL) BY MOUTH DAILY BEFORE BREAKFAST (TAKE IN ADDITION TO 50MCG TABLET)  ? levothyroxine (SYNTHROID) 50 MCG tablet TAKE 1 TABLET DAILY BEFORE BREAKFAST. (TAKE IN ADDITION TO 200MCG TABLET FOR TOTAL DOSE 250 MCG)  ? Multiple Vitamin (MULTIVITAMIN) tablet Take 1 tablet by mouth 2 (two) times daily.  ? Prodigy Lancets 28G MISC Check BS daily and as needed Dx E11.9  ? sildenafil (VIAGRA) 100 MG tablet Take 0.5-1 tablets (50-100 mg total) by mouth daily as needed for erectile dysfunction.  ? SYRINGE-NEEDLE, DISP, 3 ML 20G X 1-1/2" 3 ML MISC 1 each by Does not apply route once a week.  ? testosterone cypionate (DEPOTESTOSTERONE CYPIONATE) 200 MG/ML injection Inject 200 mg into the muscle every 14 (fourteen) days.  ? torsemide 40 MG TABS Take 40 mg by mouth daily.  ? XARELTO 20 MG TABS tablet TAKE 1 TABLET (20 MG TOTAL) BY MOUTH DAILY WITH SUPPER FOR 30 DAYS.  ? [DISCONTINUED] atorvastatin (LIPITOR) 20 MG tablet TAKE ONE TABLET BY MOUTH EVERY DAY  ? [DISCONTINUED] gabapentin (NEURONTIN) 100 MG capsule Take 1 capsule (100 mg total) by  mouth 3 (three) times daily. (NEEDS TO BE SEEN BEFORE NEXT REFILL)  ? [DISCONTINUED] metFORMIN (GLUCOPHAGE-XR) 750 MG 24 hr tablet TAKE 1 TABLET DAILY  ? [DISCONTINUED] metoprolol tartrate (LOPRESSOR) 100 MG tablet TAKE (2) TABLETS DAILY  ? [DISCONTINUED] RYBELSUS 7 MG TABS TAKE ONE TABLET ONCE DAILY  ? [DISCONTINUED] spironolactone (ALDACTONE) 25 MG tablet TAKE ONE TABLET ONCE DAILY  ? [DISCONTINUED] valsartan (DIOVAN) 80 MG tablet Take 1 tablet (80 mg total) by mouth daily.  ? atorvastatin (LIPITOR) 20 MG tablet Take 1 tablet (20 mg total) by mouth daily.  ? gabapentin (NEURONTIN) 100 MG capsule Take 1 capsule (100 mg total) by mouth 3 (three) times daily.  ? metFORMIN (GLUCOPHAGE-XR) 750 MG 24 hr tablet Take 1 tablet (750 mg total) by mouth  daily.  ? metoprolol tartrate (LOPRESSOR) 100 MG tablet TAKE (2) TABLETS DAILY  ? Semaglutide (RYBELSUS) 7 MG TABS Take 1 tablet by mouth daily.  ? spironolactone (ALDACTONE) 25 MG tablet Take 1 tablet (25 mg total) by mouth daily.  ? valsartan (DIOVAN) 80 MG tablet Take 1 tablet (80 mg total) by mouth daily.  ? ?Facility-Administered Encounter Medications as of 01/14/2022  ?Medication  ? testosterone cypionate (DEPOTESTOSTERONE CYPIONATE) injection 200 mg  ? ? ?Allergies  ?Allergen Reactions  ? Morphine And Related Nausea And Vomiting and Nausea Only  ? ? ?Review of Systems  ?Constitutional:  Positive for activity change, appetite change, fatigue and unexpected weight change. Negative for chills, diaphoresis and fever.  ?Eyes:  Negative for photophobia and visual disturbance.  ?Respiratory:  Negative for cough and shortness of breath.   ?Cardiovascular:  Positive for leg swelling. Negative for chest pain and palpitations.  ?Gastrointestinal:  Negative for abdominal distention, abdominal pain, anal bleeding, blood in stool, constipation, diarrhea, nausea, rectal pain and vomiting.  ?Endocrine: Negative for cold intolerance, heat intolerance, polydipsia, polyphagia and polyuria.   ?Genitourinary:  Negative for decreased urine volume, difficulty urinating, dysuria, enuresis, flank pain, frequency, genital sores, hematuria, penile discharge, penile pain, penile swelling, scrotal swelling

## 2022-01-15 LAB — CBC WITH DIFFERENTIAL/PLATELET
Basophils Absolute: 0.1 10*3/uL (ref 0.0–0.2)
Basos: 1 %
EOS (ABSOLUTE): 0.2 10*3/uL (ref 0.0–0.4)
Eos: 2 %
Hematocrit: 38.9 % (ref 37.5–51.0)
Hemoglobin: 13 g/dL (ref 13.0–17.7)
Immature Grans (Abs): 0 10*3/uL (ref 0.0–0.1)
Immature Granulocytes: 0 %
Lymphocytes Absolute: 3.1 10*3/uL (ref 0.7–3.1)
Lymphs: 39 %
MCH: 29.6 pg (ref 26.6–33.0)
MCHC: 33.4 g/dL (ref 31.5–35.7)
MCV: 89 fL (ref 79–97)
Monocytes Absolute: 1.1 10*3/uL — ABNORMAL HIGH (ref 0.1–0.9)
Monocytes: 14 %
Neutrophils Absolute: 3.5 10*3/uL (ref 1.4–7.0)
Neutrophils: 44 %
Platelets: 247 10*3/uL (ref 150–450)
RBC: 4.39 x10E6/uL (ref 4.14–5.80)
RDW: 12.1 % (ref 11.6–15.4)
WBC: 8 10*3/uL (ref 3.4–10.8)

## 2022-01-15 LAB — TSH+FREE T4
Free T4: 1.77 ng/dL (ref 0.82–1.77)
TSH: 0.074 u[IU]/mL — ABNORMAL LOW (ref 0.450–4.500)

## 2022-01-15 LAB — CMP14+EGFR
ALT: 17 IU/L (ref 0–44)
AST: 20 IU/L (ref 0–40)
Albumin/Globulin Ratio: 1.6 (ref 1.2–2.2)
Albumin: 4.7 g/dL (ref 3.8–4.8)
Alkaline Phosphatase: 96 IU/L (ref 44–121)
BUN/Creatinine Ratio: 19 (ref 10–24)
BUN: 28 mg/dL — ABNORMAL HIGH (ref 8–27)
Bilirubin Total: 1.1 mg/dL (ref 0.0–1.2)
CO2: 25 mmol/L (ref 20–29)
Calcium: 9.3 mg/dL (ref 8.6–10.2)
Chloride: 102 mmol/L (ref 96–106)
Creatinine, Ser: 1.44 mg/dL — ABNORMAL HIGH (ref 0.76–1.27)
Globulin, Total: 3 g/dL (ref 1.5–4.5)
Glucose: 115 mg/dL — ABNORMAL HIGH (ref 70–99)
Potassium: 4.5 mmol/L (ref 3.5–5.2)
Sodium: 143 mmol/L (ref 134–144)
Total Protein: 7.7 g/dL (ref 6.0–8.5)
eGFR: 53 mL/min/{1.73_m2} — ABNORMAL LOW (ref >59)

## 2022-01-15 LAB — LIPID PANEL
Chol/HDL Ratio: 2.4 ratio (ref 0.0–5.0)
Cholesterol, Total: 71 mg/dL — ABNORMAL LOW (ref 100–199)
HDL: 30 mg/dL — ABNORMAL LOW (ref >39)
LDL Chol Calc (NIH): 23 mg/dL (ref 0–99)
Triglycerides: 91 mg/dL (ref 0–149)
VLDL Cholesterol Cal: 18 mg/dL (ref 5–40)

## 2022-01-16 LAB — T3, FREE: T3, Free: 3.5 pg/mL (ref 2.0–4.4)

## 2022-01-16 LAB — TESTOSTERONE: Testosterone: 637 ng/dL (ref 264–916)

## 2022-01-16 LAB — SPECIMEN STATUS REPORT

## 2022-01-22 ENCOUNTER — Ambulatory Visit (INDEPENDENT_AMBULATORY_CARE_PROVIDER_SITE_OTHER): Payer: Medicare HMO | Admitting: Emergency Medicine

## 2022-01-22 DIAGNOSIS — R7989 Other specified abnormal findings of blood chemistry: Secondary | ICD-10-CM

## 2022-01-22 DIAGNOSIS — E291 Testicular hypofunction: Secondary | ICD-10-CM

## 2022-01-25 ENCOUNTER — Encounter: Payer: Self-pay | Admitting: Family Medicine

## 2022-01-26 ENCOUNTER — Other Ambulatory Visit: Payer: Self-pay | Admitting: *Deleted

## 2022-01-26 DIAGNOSIS — N289 Disorder of kidney and ureter, unspecified: Secondary | ICD-10-CM

## 2022-01-26 DIAGNOSIS — E890 Postprocedural hypothyroidism: Secondary | ICD-10-CM

## 2022-01-28 ENCOUNTER — Encounter: Payer: Self-pay | Admitting: Family Medicine

## 2022-01-28 ENCOUNTER — Other Ambulatory Visit: Payer: Medicare HMO

## 2022-01-28 DIAGNOSIS — N289 Disorder of kidney and ureter, unspecified: Secondary | ICD-10-CM

## 2022-01-28 DIAGNOSIS — E890 Postprocedural hypothyroidism: Secondary | ICD-10-CM

## 2022-01-29 LAB — BMP8+EGFR
BUN/Creatinine Ratio: 18 (ref 10–24)
BUN: 26 mg/dL (ref 8–27)
CO2: 26 mmol/L (ref 20–29)
Calcium: 9.4 mg/dL (ref 8.6–10.2)
Chloride: 102 mmol/L (ref 96–106)
Creatinine, Ser: 1.42 mg/dL — ABNORMAL HIGH (ref 0.76–1.27)
Glucose: 125 mg/dL — ABNORMAL HIGH (ref 70–99)
Potassium: 4.3 mmol/L (ref 3.5–5.2)
Sodium: 143 mmol/L (ref 134–144)
eGFR: 54 mL/min/{1.73_m2} — ABNORMAL LOW (ref >59)

## 2022-01-29 LAB — T4, FREE: Free T4: 1.74 ng/dL (ref 0.82–1.77)

## 2022-01-30 LAB — TESTOSTERONE,FREE AND TOTAL
Testosterone, Free: 9.5 pg/mL (ref 6.6–18.1)
Testosterone: 633 ng/dL (ref 264–916)

## 2022-01-30 LAB — T3, FREE: T3, Free: 2.9 pg/mL (ref 2.0–4.4)

## 2022-02-02 ENCOUNTER — Encounter: Payer: Self-pay | Admitting: Nurse Practitioner

## 2022-02-02 ENCOUNTER — Ambulatory Visit: Payer: Medicare HMO | Admitting: Nurse Practitioner

## 2022-02-02 VITALS — BP 114/66 | HR 60 | Temp 97.6°F | Resp 20 | Ht 75.0 in | Wt 392.0 lb

## 2022-02-02 DIAGNOSIS — R7989 Other specified abnormal findings of blood chemistry: Secondary | ICD-10-CM

## 2022-02-02 DIAGNOSIS — E291 Testicular hypofunction: Secondary | ICD-10-CM

## 2022-02-02 DIAGNOSIS — F4321 Adjustment disorder with depressed mood: Secondary | ICD-10-CM | POA: Diagnosis not present

## 2022-02-02 MED ORDER — DIAZEPAM 10 MG PO TABS: ORAL_TABLET | ORAL | 0 refills | Status: DC

## 2022-02-02 NOTE — Progress Notes (Signed)
? ?Subjective:  ? ? Patient ID: Richard Crawford, male    DOB: 01/16/1956, 66 y.o.   MRN: [id number] ? ? ?Chief Complaint: death in family ? ?Patient states that a very close friend of his passed away and he is very anxious. He is going to have to go to [place] for the funeral. Richard Crawford. Richard Crawford has given her diazepam in the past when his dad died and his mother died. He says when he gest anxious he gets chest pain and SOB. Would like a few diazepam to get throught the funeral. ? ?  02/02/2022  ?  2:35 PM 01/14/2022  ? 10:31 AM 10/16/2021  ?  3:18 PM  ?Depression screen PHQ 2/9  ?Decreased Interest 3 0 0  ?Down, Depressed, Hopeless 0 0 0  ?PHQ - 2 Score 3 0 0  ?Altered sleeping 0  0  ?Tired, decreased energy 3  0  ?Change in appetite 0  0  ?Feeling bad or failure about yourself  0  0  ?Trouble concentrating 0  0  ?Moving slowly or fidgety/restless 0  0  ?Suicidal thoughts 0  0  ?PHQ-9 Score 6  0  ?Difficult doing work/chores Very difficult  Not difficult at all  ? ? ? ?  02/02/2022  ?  2:36 PM 10/16/2021  ?  3:18 PM 07/16/2021  ?  2:49 PM 07/16/2021  ?  2:48 PM  ?GAD 7 : Generalized Anxiety Score  ?Nervous, Anxious, on Edge 1 0 0 0  ?Control/stop worrying 0 0 0 0  ?Worry too much - different things 0 0 0 0  ?Trouble relaxing 0 0 0 0  ?Restless 0 0 0 0  ?Easily annoyed or irritable 0 0 0 0  ?Afraid - awful might happen 0 0 0 0  ?Total GAD 7 Score 1 0 0 0  ?Anxiety Difficulty Not difficult at all Not difficult at all Not difficult at all   ? ? ? ? ? ? ? ?Review of Systems  ?Constitutional:  Negative for diaphoresis.  ?Eyes:  Negative for pain.  ?Respiratory:  Negative for shortness of breath.   ?Cardiovascular:  Negative for chest pain, palpitations and leg swelling.  ?Gastrointestinal:  Negative for abdominal pain.  ?Endocrine: Negative for polydipsia.  ?Skin:  Negative for rash.  ?Neurological:  Negative for dizziness, weakness and headaches.  ?Hematological:  Does not bruise/bleed easily.  ?All other systems reviewed and are  negative. ? ?   ?Objective:  ? Physical Exam ?Constitutional:   ?   Appearance: Normal appearance. He is obese.  ?Cardiovascular:  ?   Rate and Rhythm: Normal rate and regular rhythm.  ?   Heart sounds: Normal heart sounds.  ?Pulmonary:  ?   Effort: Pulmonary effort is normal.  ?   Breath sounds: Normal breath sounds.  ?Skin: ?   General: Skin is warm.  ?Neurological:  ?   General: No focal deficit present.  ?   Mental Status: He is alert and oriented to person, place, and time.  ?Psychiatric:     ?   Mood and Affect: Mood normal.     ?   Behavior: Behavior normal.  ? ?BP 114/66   Pulse 60   Temp 97.6 ?F (36.4 ?C) (Temporal)   Resp 20   Ht '6\' 3"'$  (1.905 m)   Wt (!) 392 lb (177.8 kg)   SpO2 95%   BMI 49.00 kg/m?  ? ? ? ? ?   ?Assessment & Plan:  ? ?Richard Crawford  in today with chief complaint of death in family ? ? ?1. Complicated grief ?Stress management ? ?- diazepam (VALIUM) 10 MG tablet; Take 1 30 minutes before MRI scan for anxiety.  Dispense: 10 tablet; Refill: 0 ? ? ? ?The above assessment and management plan was discussed with the patient. The patient verbalized understanding of and has agreed to the management plan. Patient is aware to call the clinic if symptoms persist or worsen. Patient is aware when to return to the clinic for a follow-up visit. Patient educated on when it is appropriate to go to the emergency department.  ? ?Mary-Margaret Hassell Done, FNP ? ? ?

## 2022-02-02 NOTE — Patient Instructions (Signed)
Managing Loss, Adult People experience loss in many different ways throughout their lives. Events such as moving, changing jobs, and losing friends can create a sense of loss. The loss may be as serious as a major health change, divorce, death of a pet, or death of a loved one. All of these types of loss are likely to create a physical and emotional reaction known as grief. Grief is the result of a major change or an absence of something or someone that you count on. Grief is a normal reaction to loss. A variety of factors can affect your grieving experience, including: The nature of your loss. Your relationship to what or whom you lost. Your understanding of grief and how to manage it. Your support system. Be aware that when grief becomes extreme, it can lead to more severe issues like isolation, depression, anxiety, or suicidal thoughts. Talk with your health care provider if you have any of these issues. How to manage lifestyle changes Keep to your normal routine as much as possible. If you have trouble focusing or doing normal activities, it is acceptable to take some time away from your normal routine. Spend time with friends and loved ones. Eat a healthy diet, get plenty of sleep, and rest when you feel tired. How to recognize changes  The way that you deal with your grief will affect your ability to function as you normally do. When grieving, you may experience these changes: Numbness, shock, sadness, anxiety, anger, denial, and guilt. Thoughts about death. Unexpected crying. A physical sensation of emptiness in your stomach. Problems sleeping and eating. Tiredness (fatigue). Loss of interest in normal activities. Dreaming about or imagining seeing the person who died. A need to remember what or whom you lost. Difficulty thinking about anything other than your loss for a period of time. Relief. If you have been expecting the loss for a while, you may feel a sense of relief when it  happens. Follow these instructions at home: Activity Express your feelings in healthy ways, such as: Talking with others about your loss. It may be helpful to find others who have had a similar loss, such as a support group. Writing down your feelings in a journal. Doing physical activities to release stress and emotional energy. Doing creative activities like painting, sculpting, or playing or listening to music. Practicing resilience. This is the ability to recover and adjust after facing challenges. Reading some resources that encourage resilience may help you to learn ways to practice those behaviors.  General instructions Be patient with yourself and others. Allow the grieving process to happen, and remember that grieving takes time. It is likely that you may never feel completely done with some grief. You may find a way to move on while still cherishing memories and feelings about your loss. Accepting your loss is a process. It can take months or longer to adjust. Keep all follow-up visits. This is important. Where to find support To get support for managing loss: Ask your health care provider for help and recommendations, such as grief counseling or therapy. Think about joining a support group for people who are managing a loss. Where to find more information You can find more information about managing loss from: American Society of Clinical Oncology: www.cancer.net American Psychological Association: www.apa.org Contact a health care provider if: Your grief is extreme and keeps getting worse. You have ongoing grief that does not improve. Your body shows symptoms of grief, such as illness. You feel depressed, anxious, or   hopeless. Get help right away if: You have thoughts about hurting yourself or others. Get help right away if you feel like you may hurt yourself or others, or have thoughts about taking your own life. Go to your nearest emergency room or: Call 911. Call the  National Suicide Prevention Lifeline at 1-800-273-8255 or 988. This is open 24 hours a day. Text the Crisis Text Line at 741741. Summary Grief is the result of a major change or an absence of someone or something that you count on. Grief is a normal reaction to loss. The depth of grief and the period of recovery depend on the type of loss and your ability to adjust to the change and process your feelings. Processing grief requires patience and a willingness to accept your feelings and talk about your loss with people who are supportive. It is important to find resources that work for you and to realize that people experience grief differently. There is not one grieving process that works for everyone in the same way. Be aware that when grief becomes extreme, it can lead to more severe issues like isolation, depression, anxiety, or suicidal thoughts. Talk with your health care provider if you have any of these issues. This information is not intended to replace advice given to you by your health care provider. Make sure you discuss any questions you have with your health care provider. Document Revised: 05/18/2021 Document Reviewed: 05/18/2021 Elsevier Patient Education  2023 [employer]  

## 2022-02-04 ENCOUNTER — Ambulatory Visit: Payer: Medicare HMO

## 2022-02-18 ENCOUNTER — Ambulatory Visit (INDEPENDENT_AMBULATORY_CARE_PROVIDER_SITE_OTHER): Payer: Medicare HMO

## 2022-02-18 DIAGNOSIS — E291 Testicular hypofunction: Secondary | ICD-10-CM

## 2022-02-19 ENCOUNTER — Ambulatory Visit (INDEPENDENT_AMBULATORY_CARE_PROVIDER_SITE_OTHER): Payer: Medicare HMO

## 2022-02-19 ENCOUNTER — Encounter: Payer: Self-pay | Admitting: Family Medicine

## 2022-02-19 ENCOUNTER — Ambulatory Visit: Payer: Medicare HMO | Admitting: Family Medicine

## 2022-02-19 VITALS — BP 137/76 | HR 88 | Temp 98.7°F | Ht 75.0 in | Wt 388.0 lb

## 2022-02-19 DIAGNOSIS — F411 Generalized anxiety disorder: Secondary | ICD-10-CM | POA: Diagnosis not present

## 2022-02-19 DIAGNOSIS — G8929 Other chronic pain: Secondary | ICD-10-CM

## 2022-02-19 DIAGNOSIS — M47819 Spondylosis without myelopathy or radiculopathy, site unspecified: Secondary | ICD-10-CM | POA: Diagnosis not present

## 2022-02-19 DIAGNOSIS — F4321 Adjustment disorder with depressed mood: Secondary | ICD-10-CM

## 2022-02-19 DIAGNOSIS — L820 Inflamed seborrheic keratosis: Secondary | ICD-10-CM

## 2022-02-19 DIAGNOSIS — M5441 Lumbago with sciatica, right side: Secondary | ICD-10-CM | POA: Diagnosis not present

## 2022-02-19 DIAGNOSIS — M5442 Lumbago with sciatica, left side: Secondary | ICD-10-CM | POA: Diagnosis not present

## 2022-02-19 MED ORDER — METHYLPREDNISOLONE ACETATE 40 MG/ML IJ SUSP
40.0000 mg | Freq: Once | INTRAMUSCULAR | Status: AC
  Administered 2022-02-19: 40 mg via INTRAMUSCULAR

## 2022-02-19 MED ORDER — PREDNISONE 10 MG (21) PO TBPK: ORAL_TABLET | ORAL | 0 refills | Status: DC

## 2022-02-19 MED ORDER — DULOXETINE HCL 30 MG PO CPEP: 30.0000 mg | ORAL_CAPSULE | Freq: Every day | ORAL | 3 refills | Status: DC

## 2022-02-19 NOTE — Progress Notes (Signed)
?  ? ?Subjective:  ?Patient ID: Richard Crawford, male    DOB: 1956/07/07, 66 y.o.   MRN: [id number] ? ?Patient Care Team: ?Baruch Gouty, FNP as PCP - General (Family Medicine) ?Minus Breeding, MD as PCP - Cardiology (Cardiology)  ? ?Chief Complaint:  Back Pain ? ? ?HPI: ?Richard Crawford is a 66 y.o. male presenting on 02/19/2022 for Back Pain ? ? ?Pt presents today with complaints of ongoing and worsening back pain. No reported injuries. No loss of function. No fever, chills, weakness, confusion, or flank pain. Has tried tylenol with some relief of symptoms. No prior imaging.  ?He had to stop his Celexa because creatinine was elevated. He states this has not helped with the pain. He has ongoing grief and anxiety but is not on medications for this.  ?He states he has a lesion to his back which is tender to touch and irritated. Unsure how long this has been present.  ? ?Back Pain ?This is a chronic problem. The current episode started more than 1 year ago. The problem occurs constantly. The problem has been waxing and waning since onset. The pain is present in the lumbar spine, sacro-iliac and gluteal. The pain radiates to the right thigh and left thigh. The pain is at a severity of 7/10. The symptoms are aggravated by bending, lying down, position, sitting, standing and twisting. Associated symptoms include leg pain and tingling. Pertinent negatives include no abdominal pain, bladder incontinence, bowel incontinence, chest pain, dysuria, fever, headaches, numbness, paresis, paresthesias, pelvic pain, perianal numbness, weakness or weight loss. Risk factors include obesity, sedentary lifestyle and lack of exercise. He has tried analgesics and bed rest for the symptoms. The treatment provided mild relief.  ? ? ?  02/19/2022  ? 10:44 AM 02/02/2022  ?  2:36 PM 10/16/2021  ?  3:18 PM 07/16/2021  ?  2:49 PM  ?GAD 7 : Generalized Anxiety Score  ?Nervous, Anxious, on Edge 0 1 0 0  ?Control/stop worrying 0 0 0 0  ?Worry too much -  different things 0 0 0 0  ?Trouble relaxing 0 0 0 0  ?Restless 0 0 0 0  ?Easily annoyed or irritable 0 0 0 0  ?Afraid - awful might happen 0 0 0 0  ?Total GAD 7 Score 0 1 0 0  ?Anxiety Difficulty Not difficult at all Not difficult at all Not difficult at all Not difficult at all  ? ? ? ?  02/19/2022  ? 10:34 AM 02/02/2022  ?  2:35 PM 01/14/2022  ? 10:31 AM 10/16/2021  ?  3:18 PM 07/16/2021  ?  2:48 PM  ?Depression screen PHQ 2/9  ?Decreased Interest 2 3 0 0 0  ?Down, Depressed, Hopeless 0 0 0 0 0  ?PHQ - 2 Score 2 3 0 0 0  ?Altered sleeping 2 0  0 0  ?Tired, decreased energy 3 3  0 0  ?Change in appetite 0 0  0 0  ?Feeling bad or failure about yourself  0 0  0 0  ?Trouble concentrating 0 0  0 0  ?Moving slowly or fidgety/restless 0 0  0 0  ?Suicidal thoughts 0 0  0 0  ?PHQ-9 Score 7 6  0 0  ?Difficult doing work/chores Very difficult Very difficult  Not difficult at all Not difficult at all  ? ? ? ? ?Relevant past medical, surgical, family, and social history reviewed and updated as indicated.  ?Allergies and medications reviewed and updated. Data reviewed: Chart  in [place]. ? ? ?Past Medical History:  ?Diagnosis Date  ? Anxiety   ? Arthritis   ? left shoulder  ? Atrial fibrillation ([place])   ? Cancer [facility]) 1989  ? thyroid  ? Chronic heart failure with preserved ejection fraction ([place])   ? Diabetes mellitus without complication ([place])   ? Hemorrhoids   ? external  ? Hx of colonic polyps   ? Hyperlipidemia   ? Hypertension   ? Low testosterone 12/14/2020  ? Migraine   ? Papillary thyroid carcinoma ([place])   ? Postoperative hypothyroidism   ? Primary osteoarthritis of left shoulder   ? Sleep apnea   ? Venous stasis dermatitis of both lower extremities   ? ? ?Past Surgical History:  ?Procedure Laterality Date  ? ANKLE FUSION    ? KNEE ARTHROSCOPY    ? SHOULDER ARTHROSCOPY    ? TOTAL SHOULDER REPLACEMENT Right   ? ? ?Social History  ? ?Socioeconomic History  ? Marital status: Married  ?  Spouse name: Not on file  ? Number of children:  Not on file  ? Years of education: Not on file  ? Highest education level: Not on file  ?Occupational History  ? Occupation: retired  ?Tobacco Use  ? Smoking status: Never  ? Smokeless tobacco: Never  ?Substance and Sexual Activity  ? Alcohol use: Not Currently  ? Drug use: Never  ? Sexual activity: Not on file  ?Other Topics Concern  ? Not on file  ?Social History Narrative  ? Lives at home with wife - Originally from [place]; moved here so wife could be closer to her grandchildren (second wife)  ? ?Social Determinants of Health  ? ?Financial Resource Strain: Low Risk   ? Difficulty of Paying Living Expenses: Not hard at all  ?Food Insecurity: No Food Insecurity  ? Worried About Charity fundraiser in the Last Year: Never true  ? Ran Out of Food in the Last Year: Never true  ?Transportation Needs: No Transportation Needs  ? Lack of Transportation (Medical): No  ? Lack of Transportation (Non-Medical): No  ?Physical Activity: Insufficiently Active  ? Days of Exercise per Week: 3 days  ? Minutes of Exercise per Session: 30 min  ?Stress: No Stress Concern Present  ? Feeling of Stress : Not at all  ?Social Connections: Moderately Isolated  ? Frequency of Communication with Friends and Family: Three times a week  ? Frequency of Social Gatherings with Friends and Family: Once a week  ? Attends Religious Services: Never  ? Active Member of Clubs or Organizations: No  ? Attends Archivist Meetings: Never  ? Marital Status: Married  ?Intimate Partner Violence: Not At Risk  ? Fear of Current or Ex-Partner: No  ? Emotionally Abused: No  ? Physically Abused: No  ? Sexually Abused: No  ? ? ?Outpatient Encounter Medications as of 02/19/2022  ?Medication Sig  ? atorvastatin (LIPITOR) 20 MG tablet Take 1 tablet (20 mg total) by mouth daily.  ? diazepam (VALIUM) 10 MG tablet Take 1 30 minutes before MRI scan for anxiety.  ? DULoxetine (CYMBALTA) 30 MG capsule Take 1 capsule (30 mg total) by mouth daily.  ? gabapentin  (NEURONTIN) 100 MG capsule Take 1 capsule (100 mg total) by mouth 3 (three) times daily.  ? glucose blood test strip Check BS daily and as needed Dx E11.9  ? levothyroxine (SYNTHROID) 200 MCG tablet TAKE 1 TABLET (200 MCG TOTAL) BY MOUTH DAILY BEFORE BREAKFAST (TAKE IN  ADDITION TO 50MCG TABLET)  ? levothyroxine (SYNTHROID) 50 MCG tablet TAKE 1 TABLET DAILY BEFORE BREAKFAST. (TAKE IN ADDITION TO 200MCG TABLET FOR TOTAL DOSE 250 MCG)  ? metFORMIN (GLUCOPHAGE-XR) 750 MG 24 hr tablet Take 1 tablet (750 mg total) by mouth daily.  ? metoprolol tartrate (LOPRESSOR) 100 MG tablet TAKE (2) TABLETS DAILY  ? Multiple Vitamin (MULTIVITAMIN) tablet Take 1 tablet by mouth 2 (two) times daily.  ? predniSONE (STERAPRED UNI-PAK 21 TAB) 10 MG (21) TBPK tablet As directed x 6 days  ? Prodigy Lancets 28G MISC Check BS daily and as needed Dx E11.9  ? Semaglutide (RYBELSUS) 7 MG TABS Take 1 tablet by mouth daily.  ? sildenafil (VIAGRA) 100 MG tablet Take 0.5-1 tablets (50-100 mg total) by mouth daily as needed for erectile dysfunction.  ? spironolactone (ALDACTONE) 25 MG tablet Take 1 tablet (25 mg total) by mouth daily.  ? SYRINGE-NEEDLE, DISP, 3 ML 20G X 1-1/2" 3 ML MISC 1 each by Does not apply route once a week.  ? testosterone cypionate (DEPOTESTOSTERONE CYPIONATE) 200 MG/ML injection Inject 200 mg into the muscle every 14 (fourteen) days.  ? torsemide 40 MG TABS Take 40 mg by mouth daily.  ? valsartan (DIOVAN) 80 MG tablet Take 1 tablet (80 mg total) by mouth daily.  ? XARELTO 20 MG TABS tablet TAKE 1 TABLET (20 MG TOTAL) BY MOUTH DAILY WITH SUPPER FOR 30 DAYS.  ? [DISCONTINUED] celecoxib (CELEBREX) 200 MG capsule TAKE  (1)  CAPSULE  TWICE DAILY. (Patient taking differently: Take one capsule once daily)  ? ?Facility-Administered Encounter Medications as of 02/19/2022  ?Medication  ? [COMPLETED] methylPREDNISolone acetate (DEPO-MEDROL) injection 40 mg  ? testosterone cypionate (DEPOTESTOSTERONE CYPIONATE) injection 200 mg   ? ? ?Allergies  ?Allergen Reactions  ? Morphine And Related Nausea And Vomiting and Nausea Only  ? ? ?Review of Systems  ?Constitutional:  Positive for activity change and fatigue. Negative for appetite change, c

## 2022-02-24 ENCOUNTER — Encounter: Payer: Self-pay | Admitting: Family Medicine

## 2022-03-04 ENCOUNTER — Ambulatory Visit: Payer: Medicare HMO

## 2022-03-04 ENCOUNTER — Encounter: Payer: Self-pay | Admitting: Family Medicine

## 2022-03-04 ENCOUNTER — Telehealth: Payer: Self-pay

## 2022-03-04 ENCOUNTER — Ambulatory Visit (INDEPENDENT_AMBULATORY_CARE_PROVIDER_SITE_OTHER): Payer: Medicare HMO

## 2022-03-04 DIAGNOSIS — E291 Testicular hypofunction: Secondary | ICD-10-CM | POA: Diagnosis not present

## 2022-03-04 NOTE — Progress Notes (Signed)
Patient came in for testosterone injection - given in right ant thigh muscle - patient tolerated well.  Medication was patient supplied/

## 2022-03-04 NOTE — Telephone Encounter (Signed)
Patient came in for Testerone injection - he is almost out of medication states that he has contacted [facility] and they are out of stock due to back order on medication.  Please review and advise.

## 2022-03-05 MED ORDER — TESTOSTERONE CYPIONATE 200 MG/ML IM SOLN: 200.0000 mg | INTRAMUSCULAR | 1 refills | Status: DC

## 2022-03-05 NOTE — Telephone Encounter (Signed)
Pt aware.

## 2022-03-05 NOTE — Telephone Encounter (Signed)
[facility] has this med for the pt - I have set up the rx for you

## 2022-03-18 ENCOUNTER — Ambulatory Visit (INDEPENDENT_AMBULATORY_CARE_PROVIDER_SITE_OTHER): Payer: Medicare HMO | Admitting: Emergency Medicine

## 2022-03-18 DIAGNOSIS — E291 Testicular hypofunction: Secondary | ICD-10-CM | POA: Diagnosis not present

## 2022-03-18 DIAGNOSIS — R7989 Other specified abnormal findings of blood chemistry: Secondary | ICD-10-CM

## 2022-03-18 MED ORDER — TESTOSTERONE CYPIONATE 200 MG/ML IM SOLN
200.0000 mg | INTRAMUSCULAR | Status: DC
  Administered 2022-03-18 – 2022-11-11 (×16): 200 mg via INTRAMUSCULAR
  Administered 2022-11-26: 150 mg via INTRAMUSCULAR
  Administered 2022-12-09 – 2023-01-06 (×3): 200 mg via INTRAMUSCULAR

## 2022-03-18 NOTE — Progress Notes (Signed)
Patient presents for Testosterone Injection. Injection done in Left Thigh. Patient tolerated well.   Doloris Hall, RN

## 2022-04-01 ENCOUNTER — Ambulatory Visit (INDEPENDENT_AMBULATORY_CARE_PROVIDER_SITE_OTHER): Payer: Medicare HMO | Admitting: Emergency Medicine

## 2022-04-01 DIAGNOSIS — E291 Testicular hypofunction: Secondary | ICD-10-CM | POA: Diagnosis not present

## 2022-04-01 DIAGNOSIS — R7989 Other specified abnormal findings of blood chemistry: Secondary | ICD-10-CM

## 2022-04-06 ENCOUNTER — Other Ambulatory Visit: Payer: Self-pay | Admitting: Family Medicine

## 2022-04-06 DIAGNOSIS — I4821 Permanent atrial fibrillation: Secondary | ICD-10-CM

## 2022-04-15 ENCOUNTER — Ambulatory Visit (INDEPENDENT_AMBULATORY_CARE_PROVIDER_SITE_OTHER): Payer: Medicare HMO | Admitting: Family Medicine

## 2022-04-15 ENCOUNTER — Encounter: Payer: Self-pay | Admitting: Family Medicine

## 2022-04-15 ENCOUNTER — Ambulatory Visit (INDEPENDENT_AMBULATORY_CARE_PROVIDER_SITE_OTHER): Payer: Medicare HMO | Admitting: Emergency Medicine

## 2022-04-15 VITALS — BP 110/62 | HR 61 | Temp 98.7°F | Ht 75.0 in | Wt 381.0 lb

## 2022-04-15 DIAGNOSIS — H6123 Impacted cerumen, bilateral: Secondary | ICD-10-CM | POA: Diagnosis not present

## 2022-04-15 DIAGNOSIS — E1169 Type 2 diabetes mellitus with other specified complication: Secondary | ICD-10-CM | POA: Diagnosis not present

## 2022-04-15 DIAGNOSIS — E291 Testicular hypofunction: Secondary | ICD-10-CM | POA: Diagnosis not present

## 2022-04-15 DIAGNOSIS — M5442 Lumbago with sciatica, left side: Secondary | ICD-10-CM

## 2022-04-15 DIAGNOSIS — R7989 Other specified abnormal findings of blood chemistry: Secondary | ICD-10-CM

## 2022-04-15 DIAGNOSIS — M5441 Lumbago with sciatica, right side: Secondary | ICD-10-CM | POA: Diagnosis not present

## 2022-04-15 DIAGNOSIS — G8929 Other chronic pain: Secondary | ICD-10-CM

## 2022-04-15 LAB — BAYER DCA HB A1C WAIVED: HB A1C (BAYER DCA - WAIVED): 6.8 % — ABNORMAL HIGH (ref 4.8–5.6)

## 2022-04-15 MED ORDER — DEBROX 6.5 % OT SOLN: 5.0000 [drp] | Freq: Two times a day (BID) | OTIC | 0 refills | Status: DC

## 2022-04-15 NOTE — Progress Notes (Signed)
Subjective:  Patient ID: Richard Crawford, male    DOB: 12/15/1955, 66 y.o.   MRN: [id number]  Patient Care Team: Baruch Gouty, FNP as PCP - General (Family Medicine) Minus Breeding, MD as PCP - Cardiology (Cardiology)   Chief Complaint:  Back Pain, Ear Fullness, and Diabetes   HPI: Richard Crawford is a 66 y.o. male presenting on 04/15/2022 for Back Pain, Ear Fullness, and Diabetes   Pt presents today with complaints of ongoing back pain. Has seen neurosurgery for this. MRI was completed and pt is awaiting an appointment for spinal injections. States this appointment is not until mid August, can not withstand the pain until then.   Back Pain This is a chronic problem. The current episode started more than 1 month ago. The problem occurs constantly. The problem has been waxing and waning since onset. The pain is present in the gluteal and lumbar spine. The quality of the pain is described as aching, burning and shooting. The pain radiates to the left thigh and right thigh. The pain is moderate. The symptoms are aggravated by bending, position, standing, stress and twisting. Stiffness is present In the morning. Associated symptoms include leg pain and tingling. Pertinent negatives include no abdominal pain, bladder incontinence, bowel incontinence, chest pain, dysuria, fever, headaches, numbness, paresis, paresthesias, pelvic pain, perianal numbness, weakness or weight loss. Risk factors include obesity and sedentary lifestyle. He has tried analgesics and NSAIDs for the symptoms. The treatment provided moderate relief.  Ear Fullness  There is pain in both ears. This is a new problem. The current episode started in the past 7 days. The problem occurs constantly. The problem has been unchanged. There has been no fever. The patient is experiencing no pain. Pertinent negatives include no abdominal pain, coughing, diarrhea, ear discharge, headaches, hearing loss, neck pain, rash, rhinorrhea, sore  throat or vomiting. He has tried ear drops for the symptoms. The treatment provided no relief.  Diabetes He presents for his follow-up diabetic visit. He has type 2 diabetes mellitus. His disease course has been stable. There are no hypoglycemic associated symptoms. Pertinent negatives for hypoglycemia include no confusion, dizziness, headaches, seizures, speech difficulty or tremors. Associated symptoms include fatigue. Pertinent negatives for diabetes include no blurred vision, no chest pain, no foot paresthesias, no foot ulcerations, no polydipsia, no polyphagia, no polyuria, no visual change, no weakness and no weight loss. There are no hypoglycemic complications. Symptoms are stable. Diabetic complications include heart disease and peripheral neuropathy. Risk factors for coronary artery disease include diabetes mellitus, dyslipidemia, hypertension, male sex, obesity and sedentary lifestyle. Current diabetic treatment includes oral agent (dual therapy). He is compliant with treatment all of the time. He is following a generally healthy diet. He rarely participates in exercise. His home blood glucose trend is fluctuating minimally. An ACE inhibitor/angiotensin II receptor blocker is being taken.      Relevant past medical, surgical, family, and social history reviewed and updated as indicated.  Allergies and medications reviewed and updated. Data reviewed: Chart in [facility].   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/14/2020  Migraine    Papillary thyroid carcinoma ([facility])    Postoperative hypothyroidism    Primary osteoarthritis of left shoulder    Sleep apnea    Venous stasis  dermatitis of both lower extremities     Past Surgical History:  Procedure Laterality Date   ANKLE FUSION     KNEE ARTHROSCOPY     SHOULDER ARTHROSCOPY     TOTAL SHOULDER REPLACEMENT Right     Social History   Socioeconomic History   Marital status: Married    Spouse name: Not on file   Number of children: Not on file   Years of education: Not on file   Highest education level: Not on file  Occupational History   Occupation: retired  Tobacco Use   Smoking status: Never   Smokeless tobacco: Never  Substance and Sexual Activity   Alcohol use: Not Currently   Drug use: Never   Sexual activity: Not on file  Other Topics Concern   Not on file  Social History Narrative   Lives at home with wife - Originally from [place]; moved here so wife could be closer to her grandchildren (second wife)   Social Determinants of Health   Financial Resource Strain: Low Risk  (03/03/2021)   Overall Financial Resource Strain (CARDIA)    Difficulty of Paying Living Expenses: Not hard at all  Food Insecurity: No Food Insecurity (03/03/2021)   Hunger Vital Sign    Worried About Running Out of Food in the Last Year: Never true    Ran Out of Food in the Last Year: Never true  Transportation Needs: No Transportation Needs (03/03/2021)   PRAPARE - Hydrologist (Medical): No    Lack of Transportation (Non-Medical): No  Physical Activity: Insufficiently Active (03/03/2021)   Exercise Vital Sign    Days of Exercise per Week: 3 days    Minutes of Exercise per Session: 30 min  Stress: No Stress Concern Present (03/03/2021)   [place]    Feeling of Stress : Not at all  Social Connections: Moderately Isolated (03/03/2021)   Social Connection and Isolation Panel [NHANES]    Frequency of Communication with Friends and Family: Three times a week    Frequency of Social Gatherings with Friends and Family: Once  a week    Attends Religious Services: Never    Marine scientist or Organizations: No    Attends Archivist Meetings: Never    Marital Status: Married  Human resources officer Violence: Not At Risk (03/03/2021)   Humiliation, Afraid, Rape, and Kick questionnaire    Fear of Current or Ex-Partner: No    Emotionally Abused: No    Physically Abused: No    Sexually Abused: No    Outpatient Encounter Medications as of 04/15/2022  Medication Sig   atorvastatin (LIPITOR) 20 MG tablet Take 1 tablet (20 mg total) by mouth daily.   carbamide peroxide (DEBROX) 6.5 % OTIC solution Place 5 drops into both ears 2 (two) times daily.   diazepam (VALIUM) 10 MG tablet Take 1 30 minutes before MRI scan for anxiety.   DULoxetine (CYMBALTA) 30 MG capsule Take 1 capsule (30 mg total) by mouth daily.   gabapentin (NEURONTIN) 100 MG capsule Take 1 capsule (100 mg total) by mouth 3 (three) times daily.   glucose blood test strip Check BS daily and as needed Dx E11.9   levothyroxine (SYNTHROID) 200 MCG tablet TAKE 1 TABLET (200  MCG TOTAL) BY MOUTH DAILY BEFORE BREAKFAST (TAKE IN ADDITION TO 50MCG TABLET)   levothyroxine (SYNTHROID) 50 MCG tablet TAKE 1 TABLET DAILY BEFORE BREAKFAST. (TAKE IN ADDITION TO 200MCG TABLET FOR TOTAL DOSE 250 MCG)   metFORMIN (GLUCOPHAGE-XR) 750 MG 24 hr tablet Take 1 tablet (750 mg total) by mouth daily.   metoprolol tartrate (LOPRESSOR) 100 MG tablet TAKE (2) TABLETS DAILY   Multiple Vitamin (MULTIVITAMIN) tablet Take 1 tablet by mouth 2 (two) times daily.   Prodigy Lancets 28G MISC Check BS daily and as needed Dx E11.9   Semaglutide (RYBELSUS) 7 MG TABS Take 1 tablet by mouth daily.   sildenafil (VIAGRA) 100 MG tablet Take 0.5-1 tablets (50-100 mg total) by mouth daily as needed for erectile dysfunction.   spironolactone (ALDACTONE) 25 MG tablet Take 1 tablet (25 mg total) by mouth daily.   SYRINGE-NEEDLE, DISP, 3 ML 20G X 1-1/2" 3 ML MISC 1 each by Does not apply route once a  week.   testosterone cypionate (DEPOTESTOSTERONE CYPIONATE) 200 MG/ML injection Inject 1 mL (200 mg total) into the muscle every 14 (fourteen) days.   torsemide 40 MG TABS Take 40 mg by mouth daily.   valsartan (DIOVAN) 80 MG tablet Take 1 tablet (80 mg total) by mouth daily.   XARELTO 20 MG TABS tablet TAKE 1 TABLET (20 MG TOTAL) BY MOUTH DAILY WITH SUPPER FOR 30 DAYS.   [DISCONTINUED] predniSONE (STERAPRED UNI-PAK 21 TAB) 10 MG (21) TBPK tablet As directed x 6 days   Facility-Administered Encounter Medications as of 04/15/2022  Medication   testosterone cypionate (DEPOTESTOSTERONE CYPIONATE) injection 200 mg    Allergies  Allergen Reactions   Morphine And Related Nausea And Vomiting and Nausea Only    Review of Systems  Constitutional:  Positive for fatigue. Negative for activity change, appetite change, chills, diaphoresis, fever, unexpected weight change and weight loss.  HENT:  Positive for tinnitus. Negative for congestion, dental problem, drooling, ear discharge, ear pain, facial swelling, hearing loss, mouth sores, nosebleeds, postnasal drip, rhinorrhea, sinus pressure, sinus pain, sneezing, sore throat, trouble swallowing and voice change.   Eyes:  Negative for blurred vision.  Respiratory:  Negative for cough.   Cardiovascular:  Negative for chest pain, palpitations and leg swelling.  Gastrointestinal:  Negative for abdominal pain, bowel incontinence, diarrhea and vomiting.  Endocrine: Negative for polydipsia, polyphagia and polyuria.  Genitourinary:  Negative for bladder incontinence, decreased urine volume, difficulty urinating, dysuria and pelvic pain.  Musculoskeletal:  Positive for back pain. Negative for arthralgias, gait problem, joint swelling, myalgias, neck pain and neck stiffness.  Skin:  Negative for rash.  Neurological:  Positive for tingling. Negative for dizziness, tremors, seizures, syncope, facial asymmetry, speech difficulty, weakness, light-headedness, numbness,  headaches and paresthesias.  Psychiatric/Behavioral:  Negative for confusion.   All other systems reviewed and are negative.       Objective:  BP 110/62   Pulse 61   Temp 98.7 F (37.1 C)   Ht $R'6\' 3"'cT$  (1.905 m)   Wt (!) 381 lb (172.8 kg)   SpO2 96%   BMI 47.62 kg/m    Wt Readings from Last 3 Encounters:  04/15/22 (!) 381 lb (172.8 kg)  02/19/22 (!) 388 lb (176 kg)  02/02/22 (!) 392 lb (177.8 kg)    Physical Exam Vitals and nursing note reviewed.  Constitutional:      Appearance: Normal appearance. He is morbidly obese.  HENT:     Head: Normocephalic and atraumatic.     Right  Ear: There is impacted cerumen.     Left Ear: There is impacted cerumen.     Nose: Nose normal. No congestion or rhinorrhea.     Mouth/Throat:     Mouth: Mucous membranes are moist.     Pharynx: Oropharynx is clear.  Eyes:     Pupils: Pupils are equal, round, and reactive to light.  Cardiovascular:     Rate and Rhythm: Normal rate and regular rhythm.     Heart sounds: Normal heart sounds.  Pulmonary:     Effort: Pulmonary effort is normal.     Breath sounds: Normal breath sounds.  Musculoskeletal:     Cervical back: Normal.     Thoracic back: Normal.     Lumbar back: Tenderness present. No swelling, edema, deformity, signs of trauma, lacerations, spasms or bony tenderness. Decreased range of motion. Positive right straight leg raise test and positive left straight leg raise test. No scoliosis.     Right hip: Normal.     Left hip: Normal.     Right lower leg: No edema.     Left lower leg: No edema.  Skin:    General: Skin is warm and dry.     Capillary Refill: Capillary refill takes less than 2 seconds.  Neurological:     General: No focal deficit present.     Mental Status: He is alert and oriented to person, place, and time.     Gait: Gait normal.  Psychiatric:        Mood and Affect: Mood normal.        Behavior: Behavior normal.        Thought Content: Thought content normal.         Judgment: Judgment normal.     Results for orders placed or performed in visit on 04/15/22  Bayer DCA Hb A1c Waived  Result Value Ref Range   HB A1C (BAYER DCA - WAIVED) 6.8 (H) 4.8 - 5.6 %       Pertinent labs & imaging results that were available during my care of the patient were reviewed by me and considered in my medical decision making.  Assessment & Plan:  [facility] was seen today for back pain, ear fullness and diabetes.  Diagnoses and all orders for this visit:  Chronic bilateral low back pain with bilateral sciatica Depo-medrol 60 mg IM given in office. Symptomatic care discussed in detail. Aware to report new, worsening, or persistent symptoms. Follow up with neurosurgery / spine specialist as scheduled.   Bilateral impacted cerumen Declined ear irrigation in office. Debrox use discussed in detail. Aware to return in 2 weeks for cerumen removal if he wishes.  -     carbamide peroxide (DEBROX) 6.5 % OTIC solution; Place 5 drops into both ears 2 (two) times daily.  Type 2 diabetes mellitus with other specified complication, without long-term current use of insulin (HCC) A1C 6.8 today, at goal. No changes in regimen. Diet and exercise encouraged. BMP pending.  -     Bayer DCA Hb A1c Waived -     BMP8+EGFR     Continue all other maintenance medications.  Follow up plan: Return in 3 months (on 07/16/2022), or if symptoms worsen or fail to improve, for DM.   Continue healthy lifestyle choices, including diet (rich in fruits, vegetables, and lean proteins, and low in salt and simple carbohydrates) and exercise (at least 30 minutes of moderate physical activity daily).  Educational handout given for chronic back pain  The  above assessment and management plan was discussed with the patient. The patient verbalized understanding of and has agreed to the management plan. Patient is aware to call the clinic if they develop any new symptoms or if symptoms persist or worsen.  Patient is aware when to return to the clinic for a follow-up visit. Patient educated on when it is appropriate to go to the emergency department.   Monia Pouch, FNP-C [facility] [phone]

## 2022-04-16 LAB — BMP8+EGFR
BUN/Creatinine Ratio: 14 (ref 10–24)
BUN: 20 mg/dL (ref 8–27)
CO2: 26 mmol/L (ref 20–29)
Calcium: 9.4 mg/dL (ref 8.6–10.2)
Chloride: 102 mmol/L (ref 96–106)
Creatinine, Ser: 1.48 mg/dL — ABNORMAL HIGH (ref 0.76–1.27)
Glucose: 120 mg/dL — ABNORMAL HIGH (ref 70–99)
Potassium: 4.4 mmol/L (ref 3.5–5.2)
Sodium: 144 mmol/L (ref 134–144)
eGFR: 52 mL/min/{1.73_m2} — ABNORMAL LOW (ref >59)

## 2022-04-28 ENCOUNTER — Telehealth: Payer: Self-pay | Admitting: Family Medicine

## 2022-04-28 NOTE — Telephone Encounter (Signed)
Left message for patient to call back and schedule Medicare Annual Wellness Visit (AWV) to be completed by video or phone.   Last AWV: 02/11/2021  Please schedule at anytime with [place]     45 minute appointment  Any questions, please contact me at [phone]

## 2022-04-29 ENCOUNTER — Ambulatory Visit (INDEPENDENT_AMBULATORY_CARE_PROVIDER_SITE_OTHER): Payer: Medicare HMO

## 2022-04-29 DIAGNOSIS — R7989 Other specified abnormal findings of blood chemistry: Secondary | ICD-10-CM

## 2022-04-29 NOTE — Progress Notes (Signed)
Testosterone injection given and tolerated well.

## 2022-05-10 ENCOUNTER — Encounter: Payer: Self-pay | Admitting: Family Medicine

## 2022-05-10 ENCOUNTER — Ambulatory Visit: Payer: Medicare HMO | Admitting: Family Medicine

## 2022-05-10 VITALS — BP 121/65 | HR 80 | Temp 97.9°F | Ht 75.0 in | Wt 382.0 lb

## 2022-05-10 DIAGNOSIS — H6123 Impacted cerumen, bilateral: Secondary | ICD-10-CM

## 2022-05-10 NOTE — Progress Notes (Signed)
   Acute Office Visit  Subjective:     Patient ID: Richard Crawford, male    DOB: [birth year], 65 y.o.   MRN: [id number]  Chief Complaint  Patient presents with   Ear Fullness    Ear Fullness    Patient is in today for ear fullness in the right ear x 1 week with muffled hearing. He has a history of impacted wax. He was seen for a follow up with his PCP recently and impacted wax was noted. He declined to have his ears flushed then. He bought a home irrigation kit was but not successful. He would like to have his ears flushed today. Denies fever, pain, erythema, or drainage. He has small ear canals and has had to go to ENT in the past to have the wax removed.   ROS As per HPI.      Objective:    BP 121/65   Pulse 80   Temp 97.9 F (36.6 C) (Temporal)   Ht _0  (1.905 m)   Wt (!) 382 lb (173.3 kg)   SpO2 97%   BMI 47.75 kg/m    Physical Exam Vitals and nursing note reviewed.  Constitutional:      General: He is not in acute distress.    Appearance: He is not ill-appearing, toxic-appearing or diaphoretic.  HENT:     Right Ear: Ear canal and external ear normal. There is impacted cerumen.     Left Ear: Ear canal and external ear normal. There is impacted cerumen.  Pulmonary:     Effort: Pulmonary effort is normal. No respiratory distress.  Musculoskeletal:     Right lower leg: No edema.     Left lower leg: No edema.  Skin:    General: Skin is warm and dry.  Neurological:     General: No focal deficit present.     Mental Status: He is alert and oriented to person, place, and time.  Psychiatric:        Mood and Affect: Mood normal.        Behavior: Behavior normal.    Ear Cerumen Removal  Date/Time: 05/10/2022 11:22 AM  Performed by: Gwenlyn Perking, FNP Authorized by: Gwenlyn Perking, FNP   Anesthesia: Local Anesthetic: none Ceruminolytics applied: Ceruminolytics applied prior to the procedure. Location details: right ear and left ear Patient tolerance:  patient tolerated the procedure well with no immediate complications Procedure type: irrigation  Sedation: Patient sedated: no      No results found for any visits on 05/10/22.      Assessment & Plan:   Richard Crawford was seen today for ear fullness.  Diagnoses and all orders for this visit:  Bilateral impacted cerumen Irrigation today in office. Some cerumen remained after irrigation and was unable to remove with curette due to small canals. Reports improvement in hearing. Discussed debrox drops. Discussed referral to ENT, patient declined today.  -     Ear wax removal -     Ear Cerumen Removal  Return to office for new or worsening symptoms, or if symptoms persist.   The patient indicates understanding of these issues and agrees with the plan.  Gwenlyn Perking, FNP

## 2022-05-13 ENCOUNTER — Ambulatory Visit (INDEPENDENT_AMBULATORY_CARE_PROVIDER_SITE_OTHER): Payer: Medicare HMO

## 2022-05-13 DIAGNOSIS — E291 Testicular hypofunction: Secondary | ICD-10-CM

## 2022-05-13 DIAGNOSIS — R7989 Other specified abnormal findings of blood chemistry: Secondary | ICD-10-CM

## 2022-05-14 ENCOUNTER — Telehealth: Payer: Self-pay | Admitting: Family Medicine

## 2022-05-14 NOTE — Telephone Encounter (Signed)
Patient aware.

## 2022-05-18 ENCOUNTER — Encounter: Payer: Self-pay | Admitting: Family Medicine

## 2022-05-19 ENCOUNTER — Other Ambulatory Visit: Payer: Self-pay | Admitting: Family Medicine

## 2022-05-19 DIAGNOSIS — E1169 Type 2 diabetes mellitus with other specified complication: Secondary | ICD-10-CM

## 2022-05-19 MED ORDER — SILDENAFIL CITRATE 100 MG PO TABS: 50.0000 mg | ORAL_TABLET | Freq: Every day | ORAL | 11 refills | Status: DC | PRN

## 2022-05-25 ENCOUNTER — Ambulatory Visit: Payer: Medicare HMO | Admitting: Nurse Practitioner

## 2022-05-26 ENCOUNTER — Encounter: Payer: Self-pay | Admitting: Family Medicine

## 2022-05-26 ENCOUNTER — Ambulatory Visit: Payer: Medicare HMO | Admitting: Family Medicine

## 2022-05-26 VITALS — BP 147/74 | HR 95 | Temp 98.7°F | Ht 75.0 in | Wt 383.0 lb

## 2022-05-26 DIAGNOSIS — I5032 Chronic diastolic (congestive) heart failure: Secondary | ICD-10-CM | POA: Diagnosis not present

## 2022-05-26 DIAGNOSIS — M5441 Lumbago with sciatica, right side: Secondary | ICD-10-CM

## 2022-05-26 DIAGNOSIS — M5442 Lumbago with sciatica, left side: Secondary | ICD-10-CM | POA: Diagnosis not present

## 2022-05-26 DIAGNOSIS — G8929 Other chronic pain: Secondary | ICD-10-CM

## 2022-05-26 DIAGNOSIS — G4733 Obstructive sleep apnea (adult) (pediatric): Secondary | ICD-10-CM

## 2022-05-26 DIAGNOSIS — E890 Postprocedural hypothyroidism: Secondary | ICD-10-CM | POA: Diagnosis not present

## 2022-05-26 DIAGNOSIS — E1159 Type 2 diabetes mellitus with other circulatory complications: Secondary | ICD-10-CM | POA: Diagnosis not present

## 2022-05-26 DIAGNOSIS — E1141 Type 2 diabetes mellitus with diabetic mononeuropathy: Secondary | ICD-10-CM

## 2022-05-26 DIAGNOSIS — I152 Hypertension secondary to endocrine disorders: Secondary | ICD-10-CM

## 2022-05-26 MED ORDER — GABAPENTIN 100 MG PO CAPS: 200.0000 mg | ORAL_CAPSULE | Freq: Three times a day (TID) | ORAL | 2 refills | Status: DC

## 2022-05-26 NOTE — Patient Instructions (Signed)
Dr. Gordy Clement for [facility] fitting / order

## 2022-05-26 NOTE — Progress Notes (Signed)
Subjective:  Patient ID: Richard Crawford, male    DOB: 1956/08/21, 66 y.o.   MRN: [id number]  Patient Care Team: Baruch Gouty, FNP as PCP - General (Family Medicine) Minus Breeding, MD as PCP - Cardiology (Cardiology)   Chief Complaint:  Back injection follow up   HPI: Richard Crawford is a 66 y.o. male presenting on 05/26/2022 for Back injection follow up   Patient today for follow-up after recent back injection for chronic back pain.  States after the injection his blood sugars started to trend up.  States at one point his blood sugar was 177.  He states when his blood sugar was as high he felt like he was having shortness of breath with increased lower extremity swelling.  He states his blood sugars have now trended back down to normal and the shortness of breath has ceased.  He still has some lower extremity swelling with fatigue.  He states his thyroid medicine needs to be increased to 400 mcg as that is when he felt his best.  Thyroid panels have been normal on current repletion dose.  Patient has been seen by endocrinology who also agreed his dose does not need to be increased.  Patient feels this is the cause of all of his problems.  He has obstructive sleep apnea and request a prescription for a new facemask today.  Sleep apnea is well controlled with current settings, mask is just old.  He does have ongoing neuropathy in both of his legs.  Left ankle gives him a little bit more grief due to previous injury.  He would like to see an orthopedic to be fitted for an [place].  States he had an [place] in the past which worked very well for him.    Relevant past medical, surgical, family, and social history reviewed and updated as indicated.  Allergies and medications reviewed and updated. Data reviewed: Chart in Epic.   Past Medical History:  Diagnosis Date   Anxiety    Arthritis    left shoulder   Atrial fibrillation ([place])    Cancer ([place]) 1989   thyroid   Chronic  heart failure with preserved ejection fraction (HCC)    Diabetes mellitus without complication (HCC)    Hemorrhoids    external   Hx of colonic polyps    Hyperlipidemia    Hypertension    Low testosterone 12/14/2020   Migraine    Papillary thyroid carcinoma (HCC)    Postoperative hypothyroidism    Primary osteoarthritis of left shoulder    Sleep apnea    Venous stasis dermatitis of both lower extremities     Past Surgical History:  Procedure Laterality Date   ANKLE FUSION     KNEE ARTHROSCOPY     SHOULDER ARTHROSCOPY     TOTAL SHOULDER REPLACEMENT Right     Social History   Socioeconomic History   Marital status: Married    Spouse name: Not on file   Number of children: Not on file   Years of education: Not on file   Highest education level: Not on file  Occupational History   Occupation: retired  Tobacco Use   Smoking status: Never   Smokeless tobacco: Never  Substance and Sexual Activity   Alcohol use: Not Currently   Drug use: Never   Sexual activity: Not on file  Other Topics Concern   Not on file  Social History Narrative   Lives at home with wife - Originally from  [place]; moved here so wife could be closer to her grandchildren (second wife)   Social Determinants of Health   Financial Resource Strain: Low Risk  (03/03/2021)   Overall Financial Resource Strain (CARDIA)    Difficulty of Paying Living Expenses: Not hard at all  Food Insecurity: No Food Insecurity (03/03/2021)   Hunger Vital Sign    Worried About Running Out of Food in the Last Year: Never true    Ran Out of Food in the Last Year: Never true  Transportation Needs: No Transportation Needs (03/03/2021)   PRAPARE - Administrator, Civil Service (Medical): No    Lack of Transportation (Non-Medical): No  Physical Activity: Insufficiently Active (03/03/2021)   Exercise Vital Sign    Days of Exercise per Week: 3 days    Minutes of Exercise per Session: 30 min  Stress: No Stress  Concern Present (03/03/2021)   [employer] of Occupational Health - Occupational Stress Questionnaire    Feeling of Stress : Not at all  Social Connections: Moderately Isolated (03/03/2021)   Social Connection and Isolation Panel [NHANES]    Frequency of Communication with Friends and Family: Three times a week    Frequency of Social Gatherings with Friends and Family: Once a week    Attends Religious Services: Never    Database administrator or Organizations: No    Attends Banker Meetings: Never    Marital Status: Married  Catering manager Violence: Not At Risk (03/03/2021)   Humiliation, Afraid, Rape, and Kick questionnaire    Fear of Current or Ex-Partner: No    Emotionally Abused: No    Physically Abused: No    Sexually Abused: No    Outpatient Encounter Medications as of 05/26/2022  Medication Sig   atorvastatin (LIPITOR) 20 MG tablet Take 1 tablet (20 mg total) by mouth daily.   carbamide peroxide (DEBROX) 6.5 % OTIC solution Place 5 drops into both ears 2 (two) times daily.   DULoxetine (CYMBALTA) 30 MG capsule Take 1 capsule (30 mg total) by mouth daily.   glucose blood test strip Check BS daily and as needed Dx E11.9   levothyroxine (SYNTHROID) 200 MCG tablet TAKE 1 TABLET (200 MCG TOTAL) BY MOUTH DAILY BEFORE BREAKFAST (TAKE IN ADDITION TO TABLET)   levothyroxine (SYNTHROID) 50 MCG tablet TAKE 1 TABLET DAILY BEFORE BREAKFAST. (TAKE IN ADDITION TO TABLET FOR TOTAL DOSE 250 MCG)   metFORMIN (GLUCOPHAGE-XR) 750 MG 24 hr tablet Take 1 tablet (750 mg total) by mouth daily.   metoprolol tartrate (LOPRESSOR) 100 MG tablet TAKE (2) TABLETS DAILY   Multiple Vitamin (MULTIVITAMIN) tablet Take 1 tablet by mouth 2 (two) times daily.   Prodigy Lancets 28G MISC Check BS daily and as needed Dx E11.9   Semaglutide (RYBELSUS) 7 MG TABS Take 1 tablet by mouth daily.   sildenafil (VIAGRA) 100 MG tablet Take 0.5-1 tablets (50-100 mg total) by mouth daily as needed  for erectile dysfunction.   spironolactone (ALDACTONE) 25 MG tablet Take 1 tablet (25 mg total) by mouth daily.   SYRINGE-NEEDLE, DISP, 3 ML 20G X 1-1/2" 3 ML MISC 1 each by Does not apply route once a week.   testosterone cypionate (DEPOTESTOSTERONE CYPIONATE) 200 MG/ML injection Inject 1 mL (200 mg total) into the muscle every 14 (fourteen) days.   torsemide 40 MG TABS Take 40 mg by mouth daily.   valsartan (DIOVAN) 80 MG tablet Take 1 tablet (80 mg total) by mouth daily.  XARELTO 20 MG TABS tablet TAKE 1 TABLET (20 MG TOTAL) BY MOUTH DAILY WITH SUPPER FOR 30 DAYS.   [DISCONTINUED] diazepam (VALIUM) 10 MG tablet Take 1 30 minutes before MRI scan for anxiety.   [DISCONTINUED] gabapentin (NEURONTIN) 100 MG capsule Take 1 capsule (100 mg total) by mouth 3 (three) times daily.   gabapentin (NEURONTIN) 100 MG capsule Take 2 capsules (200 mg total) by mouth 3 (three) times daily.   Facility-Administered Encounter Medications as of 05/26/2022  Medication   testosterone cypionate (DEPOTESTOSTERONE CYPIONATE) injection 200 mg    Allergies  Allergen Reactions   Morphine And Related Nausea And Vomiting and Nausea Only    Review of Systems  Constitutional:  Positive for activity change, fatigue and unexpected weight change. Negative for appetite change, chills, diaphoresis and fever.  HENT: Negative.    Eyes: Negative.   Respiratory:  Positive for shortness of breath. Negative for cough, chest tightness and wheezing.   Cardiovascular:  Positive for leg swelling. Negative for chest pain and palpitations.  Gastrointestinal:  Negative for blood in stool, constipation, diarrhea, nausea and vomiting.  Endocrine: Negative.  Negative for polydipsia, polyphagia and polyuria.  Genitourinary:  Negative for decreased urine volume, difficulty urinating, dysuria, frequency and urgency.  Musculoskeletal:  Positive for arthralgias, back pain and gait problem. Negative for myalgias.  Skin: Negative.    Allergic/Immunologic: Negative.   Neurological:  Negative for dizziness and headaches.  Hematological: Negative.   Psychiatric/Behavioral:  Negative for confusion, hallucinations, sleep disturbance and suicidal ideas.   All other systems reviewed and are negative.       Objective:  BP (!) 147/74   Pulse 95   Temp 98.7 F (37.1 C)   Ht $R'6\' 3"'KG$  (1.905 m)   Wt (!) 383 lb (173.7 kg)   SpO2 94%   BMI 47.87 kg/m    Wt Readings from Last 3 Encounters:  05/26/22 (!) 383 lb (173.7 kg)  05/10/22 (!) 382 lb (173.3 kg)  04/15/22 (!) 381 lb (172.8 kg)    Physical Exam Vitals and nursing note reviewed.  Constitutional:      General: He is not in acute distress.    Appearance: Normal appearance. He is morbidly obese. He is not ill-appearing, toxic-appearing or diaphoretic.  HENT:     Mouth/Throat:     Mouth: Mucous membranes are moist.  Cardiovascular:     Rate and Rhythm: Normal rate and regular rhythm.     Heart sounds: Normal heart sounds. No murmur heard.    No friction rub. No gallop.  Pulmonary:     Effort: Pulmonary effort is normal.     Breath sounds: Normal breath sounds. No rhonchi or rales.  Musculoskeletal:     Cervical back: Neck supple.     Right lower leg: 1+ Edema present.     Left lower leg: 1+ Edema present.       Legs:  Skin:    General: Skin is dry.     Capillary Refill: Capillary refill takes less than 2 seconds.  Neurological:     General: No focal deficit present.     Mental Status: He is alert and oriented to person, place, and time.     Gait: Gait abnormal (antalgic, using cane).  Psychiatric:        Mood and Affect: Mood normal.        Behavior: Behavior normal.        Thought Content: Thought content normal.        Judgment:  Judgment normal.     Results for orders placed or performed in visit on 04/15/22  Bayer DCA Hb A1c Waived  Result Value Ref Range   HB A1C (BAYER DCA - WAIVED) 6.8 (H) 4.8 - 5.6 %  BMP8+EGFR  Result Value Ref Range    Glucose 120 (H) 70 - 99 mg/dL   BUN 20 8 - 27 mg/dL   Creatinine, Ser 1.48 (H) 0.76 - 1.27 mg/dL   eGFR 52 (L) >59 mL/min/1.73   BUN/Creatinine Ratio 14 10 - 24   Sodium 144 134 - 144 mmol/L   Potassium 4.4 3.5 - 5.2 mmol/L   Chloride 102 96 - 106 mmol/L   CO2 26 20 - 29 mmol/L   Calcium 9.4 8.6 - 10.2 mg/dL       Pertinent labs & imaging results that were available during my care of the patient were reviewed by me and considered in my medical decision making.  Assessment & Plan:  Richard Crawford was seen today for back injection follow up.  Diagnoses and all orders for this visit:  Chronic bilateral low back pain with bilateral sciatica Explained that elevated blood sugar readings post steroid injections are suspected and ok as long as they return to normal. Pt advised to call spine specialist to discuss future treatment options. Will increase gabapentin to 200 mg three times daily to see if beneficial.  -     gabapentin (NEURONTIN) 100 MG capsule; Take 2 capsules (200 mg total) by mouth 3 (three) times daily.  Chronic heart failure with preserved ejection fraction (HCC) Feels swelling and shortness of breath worsened after spinal injection. Well compensated in office. No indications of acute or worsening failure. Will check labs today and adjust treatment regimen if warranted.  -     CMP14+EGFR -     CBC with Differential/Platelet -     Brain natriuretic peptide -     Thyroid Panel With TSH  Hypertension associated with diabetes ([place]) BP well controlled. Changes were not made in regimen today. Goal BP is 130/80. Pt aware to report any persistent high or low readings. DASH diet and exercise encouraged. Exercise at least 150 minutes per week and increase as tolerated. Goal BMI > 25. Stress management encouraged. Avoid nicotine and tobacco product use. Avoid excessive alcohol and NSAID's. Avoid more than 2000 mg of sodium daily. Medications as prescribed. Follow up as scheduled. -      CMP14+EGFR -     CBC with Differential/Platelet -     Brain natriuretic peptide -     Thyroid Panel With TSH  Postoperative hypothyroidism Patient feels all of his problems are related to low Synthroid dosing.  Patient advised thyroid panel has been normal and endocrinology agrees that Synthroid does not need to be increased.  Will recheck labs today. -     Thyroid Panel With TSH  Obstructive sleep apnea Order for new mask provided. -     For home use only DME Other see comment  Diabetic mononeuropathy associated with type 2 diabetes mellitus ([place]) Will increase gabapentin dosing to 200 mg 3 times daily. -     gabapentin (NEURONTIN) 100 MG capsule; Take 2 capsules (200 mg total) by mouth 3 (three) times daily.     Continue all other maintenance medications.  Follow up plan: Return if symptoms worsen or fail to improve.   Continue healthy lifestyle choices, including diet (rich in fruits, vegetables, and lean proteins, and low in salt and simple carbohydrates) and exercise (  at least 30 minutes of moderate physical activity daily).   The above assessment and management plan was discussed with the patient. The patient verbalized understanding of and has agreed to the management plan. Patient is aware to call the clinic if they develop any new symptoms or if symptoms persist or worsen. Patient is aware when to return to the clinic for a follow-up visit. Patient educated on when it is appropriate to go to the emergency department.   Monia Pouch, FNP-C [facility] [phone]

## 2022-05-27 ENCOUNTER — Ambulatory Visit (INDEPENDENT_AMBULATORY_CARE_PROVIDER_SITE_OTHER): Payer: Medicare HMO

## 2022-05-27 DIAGNOSIS — E291 Testicular hypofunction: Secondary | ICD-10-CM | POA: Diagnosis not present

## 2022-05-27 LAB — CBC WITH DIFFERENTIAL/PLATELET
Basophils Absolute: 0 10*3/uL (ref 0.0–0.2)
Basos: 0 %
EOS (ABSOLUTE): 0.1 10*3/uL (ref 0.0–0.4)
Eos: 1 %
Hematocrit: 43.5 % (ref 37.5–51.0)
Hemoglobin: 14.1 g/dL (ref 13.0–17.7)
Immature Grans (Abs): 0.1 10*3/uL (ref 0.0–0.1)
Immature Granulocytes: 1 %
Lymphocytes Absolute: 1.7 10*3/uL (ref 0.7–3.1)
Lymphs: 12 %
MCH: 28.3 pg (ref 26.6–33.0)
MCHC: 32.4 g/dL (ref 31.5–35.7)
MCV: 87 fL (ref 79–97)
Monocytes Absolute: 2 10*3/uL — ABNORMAL HIGH (ref 0.1–0.9)
Monocytes: 14 %
Neutrophils Absolute: 9.9 10*3/uL — ABNORMAL HIGH (ref 1.4–7.0)
Neutrophils: 72 %
Platelets: 210 10*3/uL (ref 150–450)
RBC: 4.98 x10E6/uL (ref 4.14–5.80)
RDW: 14.4 % (ref 11.6–15.4)
WBC: 13.8 10*3/uL — ABNORMAL HIGH (ref 3.4–10.8)

## 2022-05-27 LAB — CMP14+EGFR
ALT: 51 IU/L — ABNORMAL HIGH (ref 0–44)
AST: 26 IU/L (ref 0–40)
Albumin/Globulin Ratio: 1.6 (ref 1.2–2.2)
Albumin: 4.5 g/dL (ref 3.9–4.9)
Alkaline Phosphatase: 117 IU/L (ref 44–121)
BUN/Creatinine Ratio: 16 (ref 10–24)
BUN: 22 mg/dL (ref 8–27)
Bilirubin Total: 0.9 mg/dL (ref 0.0–1.2)
CO2: 25 mmol/L (ref 20–29)
Calcium: 9.2 mg/dL (ref 8.6–10.2)
Chloride: 100 mmol/L (ref 96–106)
Creatinine, Ser: 1.37 mg/dL — ABNORMAL HIGH (ref 0.76–1.27)
Globulin, Total: 2.9 g/dL (ref 1.5–4.5)
Glucose: 131 mg/dL — ABNORMAL HIGH (ref 70–99)
Potassium: 4.7 mmol/L (ref 3.5–5.2)
Sodium: 141 mmol/L (ref 134–144)
Total Protein: 7.4 g/dL (ref 6.0–8.5)
eGFR: 57 mL/min/{1.73_m2} — ABNORMAL LOW (ref >59)

## 2022-05-27 LAB — THYROID PANEL WITH TSH
Free Thyroxine Index: 2.1 (ref 1.2–4.9)
T3 Uptake Ratio: 27 % (ref 24–39)
T4, Total: 7.8 ug/dL (ref 4.5–12.0)
TSH: 0.522 u[IU]/mL (ref 0.450–4.500)

## 2022-05-27 LAB — BRAIN NATRIURETIC PEPTIDE: BNP: 77.5 pg/mL (ref 0.0–100.0)

## 2022-05-27 MED ORDER — DAPAGLIFLOZIN PROPANEDIOL 5 MG PO TABS: 5.0000 mg | ORAL_TABLET | Freq: Every day | ORAL | 3 refills | Status: DC

## 2022-05-27 NOTE — Progress Notes (Signed)
Testosterone injection given to right thigh per patient request.  Patient tolerated well.

## 2022-05-27 NOTE — Addendum Note (Signed)
Addended by: Baruch Gouty on: 05/27/2022 04:49 PM   Modules accepted: Orders

## 2022-06-03 ENCOUNTER — Encounter: Payer: Self-pay | Admitting: Family Medicine

## 2022-06-09 ENCOUNTER — Encounter: Payer: Self-pay | Admitting: Family Medicine

## 2022-06-10 ENCOUNTER — Ambulatory Visit (INDEPENDENT_AMBULATORY_CARE_PROVIDER_SITE_OTHER): Payer: Medicare HMO

## 2022-06-10 DIAGNOSIS — R7989 Other specified abnormal findings of blood chemistry: Secondary | ICD-10-CM

## 2022-06-10 DIAGNOSIS — E291 Testicular hypofunction: Secondary | ICD-10-CM

## 2022-06-10 NOTE — Progress Notes (Signed)
Pt tolerated testosterone well

## 2022-06-21 ENCOUNTER — Ambulatory Visit: Payer: Medicare HMO | Admitting: Orthopedic Surgery

## 2022-06-21 ENCOUNTER — Encounter: Payer: Self-pay | Admitting: Orthopedic Surgery

## 2022-06-21 ENCOUNTER — Ambulatory Visit (INDEPENDENT_AMBULATORY_CARE_PROVIDER_SITE_OTHER): Payer: Medicare HMO

## 2022-06-21 VITALS — BP 148/82 | HR 100 | Ht 75.0 in | Wt 385.0 lb

## 2022-06-21 DIAGNOSIS — M25572 Pain in left ankle and joints of left foot: Secondary | ICD-10-CM
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
## 2022-06-24 ENCOUNTER — Ambulatory Visit (INDEPENDENT_AMBULATORY_CARE_PROVIDER_SITE_OTHER): Payer: Medicare HMO

## 2022-06-24 DIAGNOSIS — E291 Testicular hypofunction: Secondary | ICD-10-CM

## 2022-06-24 NOTE — Progress Notes (Signed)
Testosterone injection given and tolerated well.

## 2022-06-28 ENCOUNTER — Ambulatory Visit: Payer: Medicare HMO

## 2022-07-01 ENCOUNTER — Ambulatory Visit: Payer: Medicare HMO | Admitting: Orthopedic Surgery

## 2022-07-01 DIAGNOSIS — M25572 Pain in left ankle and joints of left foot: Secondary | ICD-10-CM

## 2022-07-08 ENCOUNTER — Other Ambulatory Visit: Payer: Self-pay | Admitting: Family Medicine

## 2022-07-08 ENCOUNTER — Ambulatory Visit (INDEPENDENT_AMBULATORY_CARE_PROVIDER_SITE_OTHER): Payer: Medicare HMO | Admitting: *Deleted

## 2022-07-08 DIAGNOSIS — E291 Testicular hypofunction: Secondary | ICD-10-CM | POA: Diagnosis not present

## 2022-07-08 DIAGNOSIS — I4821 Permanent atrial fibrillation: Secondary | ICD-10-CM

## 2022-07-11 ENCOUNTER — Encounter: Payer: Self-pay | Admitting: Orthopedic Surgery

## 2022-07-11 NOTE — Progress Notes (Signed)
Office Visit Note   Patient: Richard Crawford           Date of Birth: December 14, 1955           MRN: [id number] Visit Date: 07/01/2022              Requested by: Mordecai Rasmussen, [place] [id number],  [place] [address] PCP: Baruch Gouty, FNP  Chief Complaint  Patient presents with   Left Foot - Pain   Left Ankle - Pain      HPI: Patient is a 66 year old gentleman who was seen for initial evaluation for left ankle and foot pain.  Patient is seen in referral from Dr. Amedeo Kinsman.  Patient has had a previous tibiotalar fusion 10 years ago.  Patient has had revision fusion with also use of a bone stimulator.  He currently ambulates with a cane.  Patient is on Xarelto.  Assessment & Plan: Visit Diagnoses:  1. Pain in left ankle and joints of left foot     Plan: Patient is provided a prescription for an [place].  Discussed that he may require a subtalar fusion.  Follow-Up Instructions: Return if symptoms worsen or fail to improve.   Ortho Exam  Patient is alert, oriented, no adenopathy, well-dressed, normal affect, normal respiratory effort. Patient has a palpable dorsalis pedis pulse.  Review of the radiographs shows a valgus alignment of the tibial talar fusion with degenerative changes of the subtalar joint.  Patient has swelling and tenderness to palpation over the sinus Tarsi.  Imaging: No results found. No images are attached to the encounter.  Labs: Lab Results  Component Value Date   HGBA1C 6.8 (H) 04/15/2022   HGBA1C 6.2 (H) 01/14/2022   HGBA1C 5.9 (H) 10/16/2021   ESRSEDRATE 25 (H) 09/05/2019   ESRSEDRATE 97 (H) 08/06/2019   ESRSEDRATE 35 (H) 08/03/2019   CRP 3.9 09/05/2019   CRP 20.4 (H) 08/06/2019   CRP 18.4 (H) 08/03/2019   REPTSTATUS 08/09/2019 FINAL 08/03/2019   CULT  08/03/2019    NO GROWTH 5 DAYS Performed at [facility], [address], [place], [place] [address]      Lab Results  Component Value Date   ALBUMIN 4.5 05/26/2022   ALBUMIN 4.7  01/14/2022   ALBUMIN 4.5 07/16/2021    No results found for: "MG" Lab Results  Component Value Date   VD25OH 45.6 07/16/2021   VD25OH 39.1 03/20/2019    No results found for: "PREALBUMIN"    Latest Ref Rng & Units 05/26/2022   10:52 AM 01/14/2022   10:37 AM 07/16/2021    2:34 PM  CBC EXTENDED  WBC 3.4 - 10.8 x10E3/uL 13.8  8.0  11.6   RBC 4.14 - 5.80 x10E6/uL 4.98  4.39  4.52   Hemoglobin 13.0 - 17.7 g/dL 14.1  13.0  13.3   HCT 37.5 - 51.0 % 43.5  38.9  41.2   Platelets 150 - 450 x10E3/uL 210  247  224   NEUT# 1.4 - 7.0 x10E3/uL 9.9  3.5  6.6   Lymph# 0.7 - 3.1 x10E3/uL 1.7  3.1  3.6      There is no height or weight on file to calculate BMI.  Orders:  No orders of the defined types were placed in this encounter.  No orders of the defined types were placed in this encounter.    Procedures: No procedures performed  Clinical Data: No additional findings.  ROS:  All other systems negative, except as  noted in the HPI. Review of Systems  Objective: Vital Signs: There were no vitals taken for this visit.  Specialty Comments:  No specialty comments available.  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) 12/15/2018   History of colonic polyps 12/15/2018   Generalized anxiety disorder 06/29/2018   Chronic heart failure with preserved ejection fraction ([place]) 05/01/2018    Hyperlipidemia associated with type 2 diabetes mellitus ([place]) 02/08/2018   Venous stasis dermatitis of both lower extremities 01/05/2018   Obstructive sleep apnea 01/05/2018   Primary osteoarthritis of left shoulder 11/15/2017   Combined forms of age-related cataract of both eyes 11/14/2017   Myopia with astigmatism and presbyopia, bilateral 11/14/2017   Hypertension associated with diabetes ([place]) 09/26/2017   Corns and callosity 09/26/2017   Postoperative hypothyroidism 09/26/2017   Type 2 diabetes mellitus with other specified complication ([place]) 34/19/3790   Past Medical History:  Diagnosis Date   Anxiety    Arthritis    left shoulder   Atrial fibrillation ([place])    Cancer ([place]) 1989   thyroid   Chronic heart failure with preserved ejection fraction (HCC)    Diabetes mellitus without complication ([place])    Hemorrhoids    external   Hx of colonic polyps    Hyperlipidemia    Hypertension    Low testosterone 12/14/2020   Migraine    Papillary thyroid carcinoma (HCC)    Postoperative hypothyroidism    Primary osteoarthritis of left shoulder    Sleep apnea    Venous stasis dermatitis of both lower extremities     Family History  Problem Relation Age of Onset   Hypertension Mother    Dementia Mother    Breast cancer Mother    Stomach cancer Mother    Ovarian cancer Mother    Heart disease Father     Past Surgical History:  Procedure Laterality Date   ANKLE FUSION     KNEE ARTHROSCOPY     SHOULDER ARTHROSCOPY     TOTAL SHOULDER REPLACEMENT Right    Social History   Occupational History   Occupation: retired  Tobacco Use   Smoking status: Never   Smokeless tobacco: Never  Substance and Sexual Activity   Alcohol use: Not Currently   Drug use: Never   Sexual activity: Not on file

## 2022-07-20 ENCOUNTER — Ambulatory Visit (INDEPENDENT_AMBULATORY_CARE_PROVIDER_SITE_OTHER): Payer: Medicare HMO | Admitting: Family Medicine

## 2022-07-20 ENCOUNTER — Encounter: Payer: Self-pay | Admitting: Family Medicine

## 2022-07-20 VITALS — BP 124/77 | HR 105 | Temp 97.9°F | Ht 75.0 in | Wt 372.4 lb

## 2022-07-20 DIAGNOSIS — E1169 Type 2 diabetes mellitus with other specified complication: Secondary | ICD-10-CM

## 2022-07-20 DIAGNOSIS — E1159 Type 2 diabetes mellitus with other circulatory complications: Secondary | ICD-10-CM

## 2022-07-20 DIAGNOSIS — E291 Testicular hypofunction: Secondary | ICD-10-CM

## 2022-07-20 DIAGNOSIS — I4821 Permanent atrial fibrillation: Secondary | ICD-10-CM

## 2022-07-20 DIAGNOSIS — I152 Hypertension secondary to endocrine disorders: Secondary | ICD-10-CM

## 2022-07-20 DIAGNOSIS — L0291 Cutaneous abscess, unspecified: Secondary | ICD-10-CM

## 2022-07-20 LAB — BAYER DCA HB A1C WAIVED: HB A1C (BAYER DCA - WAIVED): 7.2 % — ABNORMAL HIGH (ref 4.8–5.6)

## 2022-07-20 MED ORDER — DAPAGLIFLOZIN PROPANEDIOL 5 MG PO TABS: 5.0000 mg | ORAL_TABLET | Freq: Every day | ORAL | 1 refills | Status: DC

## 2022-07-20 MED ORDER — DOXYCYCLINE HYCLATE 100 MG PO TABS: 100.0000 mg | ORAL_TABLET | Freq: Two times a day (BID) | ORAL | 0 refills | Status: AC

## 2022-07-20 NOTE — Patient Instructions (Signed)

## 2022-07-20 NOTE — Progress Notes (Signed)
Subjective:    Patient ID: Richard Crawford, male    DOB: 1956/08/04, 66 y.o.   MRN: [id number]   Chief Complaint: 3 month follow-up   HPI:  Richard Crawford is a 66 y.o. who identifies as a male who was assigned male at birth.   Social history: Lives with: wife Work history: retired   Water engineer in today for follow up of the following chronic medical issues:  1. Type 2 diabetes mellitus with other specified complication, without long-term current use of insulin (HCC) Pt checks blood sugars at home, has been running in the 130s. No low blood sugars that he is aware of. Lab Results  Component Value Date   HGBA1C 6.8 (H) 04/15/2022    2. Hypertension associated with diabetes (HCC) No c/o chest pain, sob, or headaches. Does not check BP at home. BP Readings from Last 3 Encounters:  07/20/22 124/77  06/21/22 (!) 148/82  05/26/22 (!) 147/74    3. Testosterone deficiency in male Doing well on therapy; no adverse effects; H&H have remained normal.  Lab Results  Component Value Date   TESTOSTERONE 633 01/28/2022    New complaints: "Lump on upper back"  Allergies  Allergen Reactions   Morphine And Related Nausea And Vomiting and Nausea Only   Outpatient Encounter Medications as of 07/20/2022  Medication Sig   atorvastatin (LIPITOR) 20 MG tablet Take 1 tablet (20 mg total) by mouth daily.   carbamide peroxide (DEBROX) 6.5 % OTIC solution Place 5 drops into both ears 2 (two) times daily.   dapagliflozin propanediol (FARXIGA) 5 MG TABS tablet Take 1 tablet (5 mg total) by mouth daily before breakfast.   DULoxetine (CYMBALTA) 30 MG capsule Take 1 capsule (30 mg total) by mouth daily.   gabapentin (NEURONTIN) 100 MG capsule Take 2 capsules (200 mg total) by mouth 3 (three) times daily.   glucose blood test strip Check BS daily and as needed Dx E11.9   levothyroxine (SYNTHROID) 200 MCG tablet TAKE 1 TABLET (200 MCG TOTAL) BY MOUTH DAILY BEFORE BREAKFAST (TAKE IN ADDITION TO  TABLET)   levothyroxine (SYNTHROID) 50 MCG tablet TAKE 1 TABLET DAILY BEFORE BREAKFAST. (TAKE IN ADDITION TO TABLET FOR TOTAL DOSE 250 MCG)   metoprolol tartrate (LOPRESSOR) 100 MG tablet TAKE (2) TABLETS DAILY   Multiple Vitamin (MULTIVITAMIN) tablet Take 1 tablet by mouth 2 (two) times daily.   Prodigy Lancets 28G MISC Check BS daily and as needed Dx E11.9   Semaglutide (RYBELSUS) 7 MG TABS Take 1 tablet by mouth daily.   sildenafil (VIAGRA) 100 MG tablet Take 0.5-1 tablets (50-100 mg total) by mouth daily as needed for erectile dysfunction.   spironolactone (ALDACTONE) 25 MG tablet Take 1 tablet (25 mg total) by mouth daily.   SYRINGE-NEEDLE, DISP, 3 ML 20G X 1-1/2" 3 ML MISC 1 each by Does not apply route once a week.   testosterone cypionate (DEPOTESTOSTERONE CYPIONATE) 200 MG/ML injection Inject 1 mL (200 mg total) into the muscle every 14 (fourteen) days.   torsemide 40 MG TABS Take 40 mg by mouth daily.   valsartan (DIOVAN) 80 MG tablet Take 1 tablet (80 mg total) by mouth daily.   XARELTO 20 MG TABS tablet TAKE 1 TABLET (20 MG TOTAL) BY MOUTH DAILY WITH SUPPER FOR 30 DAYS.   Facility-Administered Encounter Medications as of 07/20/2022  Medication   testosterone cypionate (DEPOTESTOSTERONE CYPIONATE) injection 200 mg    Past Surgical History:  Procedure Laterality Date   ANKLE FUSION  KNEE ARTHROSCOPY     SHOULDER ARTHROSCOPY     TOTAL SHOULDER REPLACEMENT Right     Family History  Problem Relation Age of Onset   Hypertension Mother    Dementia Mother    Breast cancer Mother    Stomach cancer Mother    Ovarian cancer Mother    Heart disease Father       Controlled substance contract: 07/20/22     Review of Systems  Constitutional:  Negative for diaphoresis.  Respiratory:  Negative for chest tightness and shortness of breath.   Cardiovascular:  Positive for leg swelling. Negative for chest pain and palpitations.       Baseline for pt   Gastrointestinal:  Negative for abdominal pain.  Endocrine: Negative for cold intolerance, heat intolerance, polydipsia, polyphagia and polyuria.  Genitourinary:  Negative for difficulty urinating.  Musculoskeletal:  Positive for arthralgias and back pain.       Baseline for patient  Skin:        "Lump on upper back"  Neurological:  Negative for dizziness, weakness, light-headedness and headaches.  Hematological:  Bruises/bleeds easily.       On Xarelto  All other systems reviewed and are negative.      Objective:   Physical Exam Vitals and nursing note reviewed.  Constitutional:      General: He is not in acute distress.    Appearance: Normal appearance. He is obese. He is not ill-appearing.  HENT:     Head: Normocephalic and atraumatic.     Right Ear: Hearing normal.     Left Ear: Hearing normal.     Nose: Nose normal.     Mouth/Throat:     Mouth: Mucous membranes are moist.     Pharynx: Oropharynx is clear.  Eyes:     Conjunctiva/sclera: Conjunctivae normal.     Pupils: Pupils are equal, round, and reactive to light.  Cardiovascular:     Rate and Rhythm: Tachycardia present. Rhythm irregular.     Heart sounds: Normal heart sounds.  Pulmonary:     Effort: Pulmonary effort is normal. No respiratory distress.     Breath sounds: Normal breath sounds. No wheezing, rhonchi or rales.  Chest:     Chest wall: No tenderness.  Abdominal:     General: Bowel sounds are normal.     Palpations: Abdomen is soft.     Tenderness: There is no abdominal tenderness.  Musculoskeletal:        General: Normal range of motion.     Cervical back: Normal range of motion.     Right lower leg: 2+ Pitting Edema present.     Left lower leg: 2+ Pitting Edema present.     Comments: L ankle still bothering him, was fitted for ankle brace at outside office, awaiting arrival. Ambulating with cane.  Lymphadenopathy:     Cervical: No cervical adenopathy.  Skin:    General: Skin is warm and dry.      Findings: Abscess present. No rash.     Comments: Small abscess on upper R back at base of scapula  Neurological:     General: No focal deficit present.     Mental Status: He is alert and oriented to person, place, and time.  Psychiatric:        Mood and Affect: Mood and affect normal.        Behavior: Behavior normal.        Thought Content: Thought content normal.  Judgment: Judgment normal.     BP 124/77   Pulse (!) 105   Temp 97.9 F (36.6 C) (Temporal)   Ht $R'6\' 3"'eT$  (1.905 m)   Wt (!) 372 lb 6.4 oz (168.9 kg)   SpO2 93%   BMI 46.55 kg/m  Lab Results  Component Value Date   HGBA1C 7.2 (H) 07/20/2022        Assessment & Plan:   Richard Crawford comes in today with chief complaint of Diabetes (3 month follow up )   Diagnosis and orders addressed:  1. Type 2 diabetes mellitus with other specified complication, without long-term current use of insulin (HCC) Slight increase in HbA1c likely due to cortisone injections for back pain; no changes made today; will recheck HbA1c in 3 months and make adjustments to [place] in 3 months if warranted. - CMP14+EGFR - CBC with Differential/Platelet - Bayer DCA Hb A1c Waived - Microalbumin / creatinine urine ratio - dapagliflozin propanediol (FARXIGA) 5 MG TABS tablet; Take 1 tablet (5 mg total) by mouth daily before breakfast.  Dispense: 90 tablet; Refill: 1  2. Hypertension associated with diabetes ([place]) Low sodium diet. - CMP14+EGFR - CBC with Differential/Platelet  3. Testosterone deficiency in male Labs pending; will make adjustments to therapy if warranted. - CBC with Differential/Platelet - ToxASSURE Select 13 (MW), Urine  4. Permanent atrial fibrillation (HCC) Continue on metoprolol and xarelto; report chest pain, sob, palpitations.  5. Abscess Not fluctuant enough to I/D in office. Symptomatic management with warm compresses. - doxycycline (VIBRA-TABS) 100 MG tablet; Take 1 tablet (100 mg total) by mouth 2  (two) times daily for 10 days. 1 po bid  Dispense: 20 tablet; Refill: 0   Labs pending Health Maintenance reviewed Diet and exercise encouraged  Follow up plan: 3 months ago   Collene Leyden, FNP student

## 2022-07-21 LAB — CBC WITH DIFFERENTIAL/PLATELET
Basophils Absolute: 0.1 10*3/uL (ref 0.0–0.2)
Basos: 1 %
EOS (ABSOLUTE): 0.1 10*3/uL (ref 0.0–0.4)
Eos: 1 %
Hematocrit: 44.4 % (ref 37.5–51.0)
Hemoglobin: 14.6 g/dL (ref 13.0–17.7)
Immature Grans (Abs): 0.1 10*3/uL (ref 0.0–0.1)
Immature Granulocytes: 1 %
Lymphocytes Absolute: 1.6 10*3/uL (ref 0.7–3.1)
Lymphs: 24 %
MCH: 29.3 pg (ref 26.6–33.0)
MCHC: 32.9 g/dL (ref 31.5–35.7)
MCV: 89 fL (ref 79–97)
Monocytes Absolute: 1.1 10*3/uL — ABNORMAL HIGH (ref 0.1–0.9)
Monocytes: 17 %
Neutrophils Absolute: 3.8 10*3/uL (ref 1.4–7.0)
Neutrophils: 56 %
Platelets: 236 10*3/uL (ref 150–450)
RBC: 4.98 x10E6/uL (ref 4.14–5.80)
RDW: 13.1 % (ref 11.6–15.4)
WBC: 6.8 10*3/uL (ref 3.4–10.8)

## 2022-07-21 LAB — CMP14+EGFR
ALT: 28 IU/L (ref 0–44)
AST: 28 IU/L (ref 0–40)
Albumin/Globulin Ratio: 1.2 (ref 1.2–2.2)
Albumin: 4.2 g/dL (ref 3.9–4.9)
Alkaline Phosphatase: 144 IU/L — ABNORMAL HIGH (ref 44–121)
BUN/Creatinine Ratio: 10 (ref 10–24)
BUN: 13 mg/dL (ref 8–27)
Bilirubin Total: 0.8 mg/dL (ref 0.0–1.2)
CO2: 25 mmol/L (ref 20–29)
Calcium: 9.4 mg/dL (ref 8.6–10.2)
Chloride: 99 mmol/L (ref 96–106)
Creatinine, Ser: 1.26 mg/dL (ref 0.76–1.27)
Globulin, Total: 3.6 g/dL (ref 1.5–4.5)
Glucose: 197 mg/dL — ABNORMAL HIGH (ref 70–99)
Potassium: 4.4 mmol/L (ref 3.5–5.2)
Sodium: 140 mmol/L (ref 134–144)
Total Protein: 7.8 g/dL (ref 6.0–8.5)
eGFR: 63 mL/min/{1.73_m2} (ref >59)

## 2022-07-22 ENCOUNTER — Ambulatory Visit (INDEPENDENT_AMBULATORY_CARE_PROVIDER_SITE_OTHER): Payer: Medicare HMO | Admitting: *Deleted

## 2022-07-22 DIAGNOSIS — E291 Testicular hypofunction: Secondary | ICD-10-CM | POA: Diagnosis not present

## 2022-08-05 ENCOUNTER — Ambulatory Visit (INDEPENDENT_AMBULATORY_CARE_PROVIDER_SITE_OTHER): Payer: Medicare HMO | Admitting: *Deleted

## 2022-08-05 DIAGNOSIS — E291 Testicular hypofunction: Secondary | ICD-10-CM | POA: Diagnosis not present

## 2022-08-11 ENCOUNTER — Ambulatory Visit: Payer: Medicare HMO | Admitting: Family Medicine

## 2022-08-11 ENCOUNTER — Encounter: Payer: Self-pay | Admitting: Family Medicine

## 2022-08-11 VITALS — BP 114/70 | HR 64 | Temp 98.0°F | Ht 75.0 in | Wt 376.0 lb

## 2022-08-11 DIAGNOSIS — E1141 Type 2 diabetes mellitus with diabetic mononeuropathy: Secondary | ICD-10-CM | POA: Diagnosis not present

## 2022-08-11 DIAGNOSIS — M5441 Lumbago with sciatica, right side: Secondary | ICD-10-CM

## 2022-08-11 DIAGNOSIS — H6123 Impacted cerumen, bilateral: Secondary | ICD-10-CM

## 2022-08-11 DIAGNOSIS — L0291 Cutaneous abscess, unspecified: Secondary | ICD-10-CM

## 2022-08-11 DIAGNOSIS — F411 Generalized anxiety disorder: Secondary | ICD-10-CM

## 2022-08-11 DIAGNOSIS — M5442 Lumbago with sciatica, left side: Secondary | ICD-10-CM

## 2022-08-11 DIAGNOSIS — G8929 Other chronic pain: Secondary | ICD-10-CM

## 2022-08-11 DIAGNOSIS — F4321 Adjustment disorder with depressed mood: Secondary | ICD-10-CM

## 2022-08-11 MED ORDER — GLUCOSE BLOOD VI STRP: ORAL_STRIP | 3 refills | Status: DC

## 2022-08-11 MED ORDER — DULOXETINE HCL 30 MG PO CPEP: 30.0000 mg | ORAL_CAPSULE | Freq: Every day | ORAL | 3 refills | Status: DC

## 2022-08-11 MED ORDER — GABAPENTIN 100 MG PO CAPS: 200.0000 mg | ORAL_CAPSULE | Freq: Three times a day (TID) | ORAL | 2 refills | Status: DC

## 2022-08-11 NOTE — Progress Notes (Signed)
Subjective:    Patient ID: Rodriquez Thorner, male    DOB: 06/18/1956, 66 y.o.   MRN: [id number]   Chief Complaint: Cyst (Right shoulder blade. Has been drained before ) and ear cracking  (Bilateral )   Pt comes in today to have abscess on R upper back rechecked and also c/o ears popping.        Review of Systems  Constitutional:  Negative for chills and fever.  HENT:  Negative for congestion, ear discharge, ear pain, hearing loss, sinus pressure and sinus pain.        Ears "popping"  Respiratory:  Negative for chest tightness and shortness of breath.   Cardiovascular:  Negative for chest pain.  Skin:        Abscess on R upper back  Neurological:  Negative for dizziness and headaches.  Hematological:  Negative for adenopathy.  All other systems reviewed and are negative.      Objective:   Physical Exam Vitals and nursing note reviewed.  Constitutional:      General: He is not in acute distress.    Appearance: Normal appearance. He is not ill-appearing.  HENT:     Right Ear: External ear normal. There is impacted cerumen.     Left Ear: External ear normal. There is impacted cerumen.     Ears:     Comments: TM normal in bilat ears after irrigation Skin:    General: Skin is warm and dry.     Findings: No erythema.     Comments: Small epidermoid cyst to R upper back at shoulder blade. No erythema; no drainage; soft. Not fluctuant enough to I&D.  Neurological:     General: No focal deficit present.     Mental Status: He is alert and oriented to person, place, and time.  Psychiatric:        Mood and Affect: Mood normal.        Behavior: Behavior normal.    Ear Cerumen Removal  Date/Time: 08/11/2022 11:53 AM  Performed by: Baruch Gouty, FNP Authorized by: Baruch Gouty, FNP   Anesthesia: Local Anesthetic: none Location details: right ear Patient tolerance: patient tolerated the procedure well with no immediate complications Procedure type: irrigation   Sedation: Patient sedated: no    Ear Cerumen Removal  Date/Time: 08/11/2022 11:53 AM  Performed by: Baruch Gouty, FNP Authorized by: Baruch Gouty, FNP   Anesthesia: Local Anesthetic: none Location details: left ear Patient tolerance: patient tolerated the procedure well with no immediate complications Procedure type: irrigation  Sedation: Patient sedated: no       BP 114/70   Pulse 64   Temp 98 F (36.7 C) (Temporal)   Ht '6\' 3"'$  (1.905 m)   Wt (!) 376 lb (170.6 kg)   SpO2 94%   BMI 47.00 kg/m       Assessment & Plan:   Edwyn Inclan in today with chief complaint of Cyst (Right shoulder blade. Has been drained before ) and ear cracking  (Bilateral )   1. Abscess No indication for I&D today; offered referral to Derm; pt refused.  2. Bilateral impacted cerumen Irrigated today in office. Recommended Debrox drops to prevent future impaction; pt refused.  Refills needed for the following today; sent in 3. Chronic bilateral low back pain with bilateral sciatica - gabapentin (NEURONTIN) 100 MG capsule; Take 2 capsules (200 mg total) by mouth 3 (three) times daily.  Dispense: 270 capsule; Refill: 2 - DULoxetine (CYMBALTA) 30 MG  capsule; Take 1 capsule (30 mg total) by mouth daily.  Dispense: 90 capsule; Refill: 3 4. Diabetic mononeuropathy associated with type 2 diabetes mellitus (HCC) - gabapentin (NEURONTIN) 100 MG capsule; Take 2 capsules (200 mg total) by mouth 3 (three) times daily.  Dispense: 270 capsule; Refill: 2 5. Complicated grief - DULoxetine (CYMBALTA) 30 MG capsule; Take 1 capsule (30 mg total) by mouth daily.  Dispense: 90 capsule; Refill: 3 6. Generalized anxiety disorder - DULoxetine (CYMBALTA) 30 MG capsule; Take 1 capsule (30 mg total) by mouth daily.  Dispense: 90 capsule; Refill: 3    The above assessment and management plan was discussed with the patient. The patient verbalized understanding of and has agreed to the management plan. Patient  is aware to call the clinic if symptoms persist or worsen. Patient is aware when to return to the clinic for a follow-up visit. Patient educated on when it is appropriate to go to the emergency department.   Collene Leyden, FNP student     I personally was present during the history, physical exam, and medical decision-making activities of this visit and have verified that the services and findings are accurately documented in the nurse practitioner student's note.  Monia Pouch, FNP-C [facility] [address] [place], [place] [address] [phone]

## 2022-08-19 ENCOUNTER — Ambulatory Visit (INDEPENDENT_AMBULATORY_CARE_PROVIDER_SITE_OTHER): Payer: Medicare HMO

## 2022-08-19 ENCOUNTER — Ambulatory Visit: Payer: Medicare HMO

## 2022-08-19 ENCOUNTER — Encounter: Payer: Self-pay | Admitting: Family Medicine

## 2022-08-19 ENCOUNTER — Ambulatory Visit (INDEPENDENT_AMBULATORY_CARE_PROVIDER_SITE_OTHER): Payer: Medicare HMO | Admitting: Family Medicine

## 2022-08-19 VITALS — BP 125/73 | HR 116 | Temp 97.9°F | Ht 75.0 in | Wt 372.0 lb

## 2022-08-19 DIAGNOSIS — M79644 Pain in right finger(s): Secondary | ICD-10-CM | POA: Diagnosis not present

## 2022-08-19 DIAGNOSIS — M25441 Effusion, right hand: Secondary | ICD-10-CM

## 2022-08-19 MED ORDER — PREDNISONE 20 MG PO TABS: 20.0000 mg | ORAL_TABLET | Freq: Every day | ORAL | 0 refills | Status: AC

## 2022-08-19 MED ORDER — KETOROLAC TROMETHAMINE 60 MG/2ML IM SOLN
60.0000 mg | Freq: Once | INTRAMUSCULAR | Status: AC
  Administered 2022-08-19: 60 mg via INTRAMUSCULAR

## 2022-08-19 NOTE — Progress Notes (Signed)
Subjective:  Patient ID: Richard Crawford, male    DOB: 06-16-1956, 66 y.o.   MRN: [id number]  Patient Care Team: Baruch Gouty, FNP as PCP - General (Family Medicine) Minus Breeding, MD as PCP - Cardiology (Cardiology)   Chief Complaint:  hand swelling (Right knuckle swelling x 1 day. Patient states it burns also. )   HPI: Richard Crawford is a 66 y.o. male presenting on 08/19/2022 for hand swelling (Right knuckle swelling x 1 day. Patient states it burns also. )   Pt complains of right middle finger pain and swelling. States swelling started I knuckle and has not progressed down finger. No known injury.   Hand Pain  The incident occurred 2 days ago. There was no injury mechanism. The pain is present in the right fingers (right middle finger). The quality of the pain is described as shooting, aching and burning. The pain does not radiate. The pain is severe. The pain has been Constant since the incident. Pertinent negatives include no chest pain, muscle weakness, numbness or tingling. The symptoms are aggravated by movement, lifting and palpation. He has tried nothing for the symptoms. The treatment provided no relief.     Relevant past medical, surgical, family, and social history reviewed and updated as indicated.  Allergies and medications reviewed and updated. Data reviewed: Chart in Epic.   Past Medical History:  Diagnosis Date   Anxiety    Arthritis    left shoulder   Atrial fibrillation ([place])    Cancer ([place]) 1989   thyroid   Chronic heart failure with preserved ejection fraction (HCC)    Diabetes mellitus without complication (HCC)    Hemorrhoids    external   Hx of colonic polyps    Hyperlipidemia    Hypertension    Low testosterone 12/14/2020   Migraine    Papillary thyroid carcinoma (HCC)    Postoperative hypothyroidism    Primary osteoarthritis of left shoulder    Sleep apnea    Venous stasis dermatitis of both lower extremities     Past Surgical History:   Procedure Laterality Date   ANKLE FUSION     KNEE ARTHROSCOPY     SHOULDER ARTHROSCOPY     TOTAL SHOULDER REPLACEMENT Right     Social History   Socioeconomic History   Marital status: Married    Spouse name: Not on file   Number of children: Not on file   Years of education: Not on file   Highest education level: Not on file  Occupational History   Occupation: retired  Tobacco Use   Smoking status: Never   Smokeless tobacco: Never  Substance and Sexual Activity   Alcohol use: Not Currently   Drug use: Never   Sexual activity: Not on file  Other Topics Concern   Not on file  Social History Narrative   Lives at home with wife - Originally from [place]; moved here so wife could be closer to her grandchildren (second wife)   Social Determinants of Health   Financial Resource Strain: Low Risk  (03/03/2021)   Overall Financial Resource Strain (CARDIA)    Difficulty of Paying Living Expenses: Not hard at all  Food Insecurity: No Food Insecurity (03/03/2021)   Hunger Vital Sign    Worried About Running Out of Food in the Last Year: Never true    Ran Out of Food in the Last Year: Never true  Transportation Needs: No Transportation Needs (03/03/2021)   PRAPARE - Transportation  Lack of Transportation (Medical): No    Lack of Transportation (Non-Medical): No  Physical Activity: Insufficiently Active (03/03/2021)   Exercise Vital Sign    Days of Exercise per Week: 3 days    Minutes of Exercise per Session: 30 min  Stress: No Stress Concern Present (03/03/2021)   [place]    Feeling of Stress : Not at all  Social Connections: Moderately Isolated (03/03/2021)   Social Connection and Isolation Panel [NHANES]    Frequency of Communication with Friends and Family: Three times a week    Frequency of Social Gatherings with Friends and Family: Once a week    Attends Religious Services: Never    Corporate treasurer or Organizations: No    Attends Archivist Meetings: Never    Marital Status: Married  Human resources officer Violence: Not At Risk (03/03/2021)   Humiliation, Afraid, Rape, and Kick questionnaire    Fear of Current or Ex-Partner: No    Emotionally Abused: No    Physically Abused: No    Sexually Abused: No    Outpatient Encounter Medications as of 08/19/2022  Medication Sig   atorvastatin (LIPITOR) 20 MG tablet Take 1 tablet (20 mg total) by mouth daily.   carbamide peroxide (DEBROX) 6.5 % OTIC solution Place 5 drops into both ears 2 (two) times daily.   dapagliflozin propanediol (FARXIGA) 5 MG TABS tablet Take 1 tablet (5 mg total) by mouth daily before breakfast.   DULoxetine (CYMBALTA) 30 MG capsule Take 1 capsule (30 mg total) by mouth daily.   gabapentin (NEURONTIN) 100 MG capsule Take 2 capsules (200 mg total) by mouth 3 (three) times daily.   glucose blood test strip Check BS daily and as needed Dx E11.9   levothyroxine (SYNTHROID) 200 MCG tablet TAKE 1 TABLET (200 MCG TOTAL) BY MOUTH DAILY BEFORE BREAKFAST (TAKE IN ADDITION TO 50MCG TABLET)   levothyroxine (SYNTHROID) 50 MCG tablet TAKE 1 TABLET DAILY BEFORE BREAKFAST. (TAKE IN ADDITION TO 200MCG TABLET FOR TOTAL DOSE 250 MCG)   metoprolol tartrate (LOPRESSOR) 100 MG tablet TAKE (2) TABLETS DAILY   Multiple Vitamin (MULTIVITAMIN) tablet Take 1 tablet by mouth 2 (two) times daily.   predniSONE (DELTASONE) 20 MG tablet Take 1 tablet (20 mg total) by mouth daily with breakfast for 5 days.   Prodigy Lancets 28G MISC Check BS daily and as needed Dx E11.9   Semaglutide (RYBELSUS) 7 MG TABS Take 1 tablet by mouth daily.   sildenafil (VIAGRA) 100 MG tablet Take 0.5-1 tablets (50-100 mg total) by mouth daily as needed for erectile dysfunction.   spironolactone (ALDACTONE) 25 MG tablet Take 1 tablet (25 mg total) by mouth daily.   SYRINGE-NEEDLE, DISP, 3 ML 20G X 1-1/2" 3 ML MISC 1 each by Does not apply route once a week.    testosterone cypionate (DEPOTESTOSTERONE CYPIONATE) 200 MG/ML injection Inject 1 mL (200 mg total) into the muscle every 14 (fourteen) days.   torsemide 40 MG TABS Take 40 mg by mouth daily.   valsartan (DIOVAN) 80 MG tablet Take 1 tablet (80 mg total) by mouth daily.   XARELTO 20 MG TABS tablet TAKE 1 TABLET (20 MG TOTAL) BY MOUTH DAILY WITH SUPPER FOR 30 DAYS.   Facility-Administered Encounter Medications as of 08/19/2022  Medication   [COMPLETED] ketorolac (TORADOL) injection 60 mg   testosterone cypionate (DEPOTESTOSTERONE CYPIONATE) injection 200 mg    Allergies  Allergen Reactions   Morphine And Related Nausea  And Vomiting and Nausea Only    Review of Systems  Constitutional:  Negative for activity change, appetite change, chills, diaphoresis, fatigue, fever and unexpected weight change.  Respiratory:  Negative for cough and shortness of breath.   Cardiovascular:  Negative for chest pain, palpitations and leg swelling.  Genitourinary:  Negative for decreased urine volume and difficulty urinating.  Musculoskeletal:  Positive for arthralgias and joint swelling.  Skin:  Positive for color change.  Neurological:  Negative for tingling, weakness and numbness.  Psychiatric/Behavioral:  Negative for confusion.   All other systems reviewed and are negative.       Objective:  BP 125/73   Pulse (!) 116   Temp 97.9 F (36.6 C) (Temporal)   Ht _0  (1.905 m)   Wt (!) 372 lb (168.7 kg)   SpO2 96%   BMI 46.50 kg/m    Wt Readings from Last 3 Encounters:  08/19/22 (!) 372 lb (168.7 kg)  08/11/22 (!) 376 lb (170.6 kg)  07/20/22 (!) 372 lb 6.4 oz (168.9 kg)    Physical Exam Vitals and nursing note reviewed.  Constitutional:      Appearance: He is morbidly obese.     Comments: Appears uncomfortable  HENT:     Head: Normocephalic and atraumatic.     Mouth/Throat:     Mouth: Mucous membranes are moist.  Eyes:     Pupils: Pupils are equal, round, and reactive to light.   Cardiovascular:     Rate and Rhythm: Normal rate and regular rhythm.     Pulses: Normal pulses.  Pulmonary:     Effort: Pulmonary effort is normal.  Musculoskeletal:     Right hand: Swelling and tenderness present. Decreased range of motion. Decreased strength of finger abduction (right middle finger).     Right lower leg: Edema present.     Left lower leg: Edema present.  Skin:    General: Skin is warm and dry.     Capillary Refill: Capillary refill takes less than 2 seconds.  Neurological:     General: No focal deficit present.     Mental Status: He is alert and oriented to person, place, and time.     Gait: Gait normal.  Psychiatric:        Mood and Affect: Mood normal.        Behavior: Behavior normal.        Thought Content: Thought content normal.        Judgment: Judgment normal.     Results for orders placed or performed in visit on 07/20/22  CMP14+EGFR  Result Value Ref Range   Glucose 197 (H) 70 - 99 mg/dL   BUN 13 8 - 27 mg/dL   Creatinine, Ser 1.26 0.76 - 1.27 mg/dL   eGFR 63 >59 mL/min/1.73   BUN/Creatinine Ratio 10 10 - 24   Sodium 140 134 - 144 mmol/L   Potassium 4.4 3.5 - 5.2 mmol/L   Chloride 99 96 - 106 mmol/L   CO2 25 20 - 29 mmol/L   Calcium 9.4 8.6 - 10.2 mg/dL   Total Protein 7.8 6.0 - 8.5 g/dL   Albumin 4.2 3.9 - 4.9 g/dL   Globulin, Total 3.6 1.5 - 4.5 g/dL   Albumin/Globulin Ratio 1.2 1.2 - 2.2   Bilirubin Total 0.8 0.0 - 1.2 mg/dL   Alkaline Phosphatase 144 (H) 44 - 121 IU/L   AST 28 0 - 40 IU/L   ALT 28 0 - 44 IU/L  CBC with  Differential/Platelet  Result Value Ref Range   WBC 6.8 3.4 - 10.8 x10E3/uL   RBC 4.98 4.14 - 5.80 x10E6/uL   Hemoglobin 14.6 13.0 - 17.7 g/dL   Hematocrit 44.4 37.5 - 51.0 %   MCV 89 79 - 97 fL   MCH 29.3 26.6 - 33.0 pg   MCHC 32.9 31.5 - 35.7 g/dL   RDW 13.1 11.6 - 15.4 %   Platelets 236 150 - 450 x10E3/uL   Neutrophils 56 Not Estab. %   Lymphs 24 Not Estab. %   Monocytes 17 Not Estab. %   Eos 1 Not Estab.  %   Basos 1 Not Estab. %   Neutrophils Absolute 3.8 1.4 - 7.0 x10E3/uL   Lymphocytes Absolute 1.6 0.7 - 3.1 x10E3/uL   Monocytes Absolute 1.1 (H) 0.1 - 0.9 x10E3/uL   EOS (ABSOLUTE) 0.1 0.0 - 0.4 x10E3/uL   Basophils Absolute 0.1 0.0 - 0.2 x10E3/uL   Immature Granulocytes 1 Not Estab. %   Immature Grans (Abs) 0.1 0.0 - 0.1 x10E3/uL  Bayer DCA Hb A1c Waived  Result Value Ref Range   HB A1C (BAYER DCA - WAIVED) 7.2 (H) 4.8 - 5.6 %     X-Ray: right middle finger: arthritic changes at knuckles, concerning or gouty arthritis. No acute findings. Preliminary x-ray reading by Monia Pouch, FNP-C, [facility].   Pertinent labs & imaging results that were available during my care of the patient were reviewed by me and considered in my medical decision making.  Assessment & Plan:  Connar was seen today for hand swelling.  Diagnoses and all orders for this visit:  Pain of finger of right hand Swelling of finger joint of right hand Differentials include gouty arthritis, jammed finger, or osteoarthritis. No fracture noted on imaging. Finger placed in splint. Burst with steroids orally. Toradol in office for acute pain. Uric acid level pending. Symptomatic care discussed in detail. Aware to report new, worsening, or persistent symptoms.  -     DG Finger Middle Right -     Uric acid -     predniSONE (DELTASONE) 20 MG tablet; Take 1 tablet (20 mg total) by mouth daily with breakfast for 5 days. -     ketorolac (TORADOL) injection 60 mg     Continue all other maintenance medications.  Follow up plan: Return if symptoms worsen or fail to improve.   Continue healthy lifestyle choices, including diet (rich in fruits, vegetables, and lean proteins, and low in salt and simple carbohydrates) and exercise (at least 30 minutes of moderate physical activity daily).  Educational handout given for gout  The above assessment and management plan was discussed with the patient. The patient verbalized  understanding of and has agreed to the management plan. Patient is aware to call the clinic if they develop any new symptoms or if symptoms persist or worsen. Patient is aware when to return to the clinic for a follow-up visit. Patient educated on when it is appropriate to go to the emergency department.   Monia Pouch, FNP-C [facility] [phone]

## 2022-08-20 LAB — URIC ACID: Uric Acid: 9.3 mg/dL — ABNORMAL HIGH (ref 3.8–8.4)

## 2022-08-23 ENCOUNTER — Encounter: Payer: Self-pay | Admitting: Family Medicine

## 2022-08-23 ENCOUNTER — Other Ambulatory Visit: Payer: Self-pay | Admitting: Family Medicine

## 2022-08-23 DIAGNOSIS — M25441 Effusion, right hand: Secondary | ICD-10-CM

## 2022-08-23 DIAGNOSIS — M79644 Pain in right finger(s): Secondary | ICD-10-CM

## 2022-08-24 ENCOUNTER — Other Ambulatory Visit: Payer: Self-pay | Admitting: Family Medicine

## 2022-08-24 DIAGNOSIS — M10041 Idiopathic gout, right hand: Secondary | ICD-10-CM

## 2022-08-24 MED ORDER — PREDNISONE 10 MG (21) PO TBPK: ORAL_TABLET | ORAL | 0 refills | Status: DC

## 2022-09-01 ENCOUNTER — Ambulatory Visit (INDEPENDENT_AMBULATORY_CARE_PROVIDER_SITE_OTHER): Payer: Medicare HMO

## 2022-09-01 DIAGNOSIS — E291 Testicular hypofunction: Secondary | ICD-10-CM

## 2022-09-01 DIAGNOSIS — R7989 Other specified abnormal findings of blood chemistry: Secondary | ICD-10-CM

## 2022-09-04 ENCOUNTER — Other Ambulatory Visit: Payer: Self-pay | Admitting: Family Medicine

## 2022-09-06 ENCOUNTER — Ambulatory Visit (INDEPENDENT_AMBULATORY_CARE_PROVIDER_SITE_OTHER): Payer: Medicare HMO | Admitting: Nurse Practitioner

## 2022-09-06 ENCOUNTER — Encounter: Payer: Self-pay | Admitting: Family Medicine

## 2022-09-06 ENCOUNTER — Other Ambulatory Visit: Payer: Self-pay | Admitting: Family Medicine

## 2022-09-06 ENCOUNTER — Encounter: Payer: Self-pay | Admitting: Nurse Practitioner

## 2022-09-06 VITALS — BP 116/69 | HR 80 | Temp 98.8°F | Ht 75.0 in | Wt 382.0 lb

## 2022-09-06 DIAGNOSIS — L0291 Cutaneous abscess, unspecified: Secondary | ICD-10-CM | POA: Diagnosis not present

## 2022-09-06 DIAGNOSIS — Z85850 Personal history of malignant neoplasm of thyroid: Secondary | ICD-10-CM

## 2022-09-06 MED ORDER — LEVOTHYROXINE SODIUM 200 MCG PO TABS: 200.0000 ug | ORAL_TABLET | Freq: Every day | ORAL | 1 refills | Status: DC

## 2022-09-06 MED ORDER — LEVOTHYROXINE SODIUM 50 MCG PO TABS: ORAL_TABLET | ORAL | 1 refills | Status: DC

## 2022-09-06 NOTE — Progress Notes (Signed)
   Acute Office Visit  Subjective:     Patient ID: Richard Crawford, male    DOB: 1955-12-22, 66 y.o.   MRN: [id number]  Chief Complaint  Patient presents with   Shoulder Injury    Lump - got it drained 1 month ago     HPI Patient is in today for cyst underneath skin right upper back.  Symptoms present for the past few weeks.  Patient was treated for abscess drainage in the same spot. H area making this e is reporting burning and pain.  He denies fever, signs and symptoms of infection.  Review of Systems  Constitutional: Negative.   Gastrointestinal: Negative.   Musculoskeletal: Negative.   Skin:  Negative for itching and rash.       Painful cyst under skin  Neurological: Negative.   Psychiatric/Behavioral: Negative.    All other systems reviewed and are negative.       Objective:    BP 116/69   Pulse 80   Temp 98.8 F (37.1 C)   Ht '6\' 3"'$  (1.905 m)   Wt (!) 382 lb (173.3 kg)   SpO2 98%   BMI 47.75 kg/m  BP Readings from Last 3 Encounters:  09/06/22 116/69  08/19/22 125/73  08/11/22 114/70   Wt Readings from Last 3 Encounters:  09/06/22 (!) 382 lb (173.3 kg)  08/19/22 (!) 372 lb (168.7 kg)  08/11/22 (!) 376 lb (170.6 kg)      Physical Exam Vitals and nursing note reviewed.  HENT:     Head: Normocephalic.     Right Ear: External ear normal.     Left Ear: External ear normal.     Nose: Nose normal. No congestion.  Eyes:     Conjunctiva/sclera: Conjunctivae normal.     Pupils: Pupils are equal, round, and reactive to light.  Cardiovascular:     Rate and Rhythm: Normal rate and regular rhythm.     Pulses: Normal pulses.     Heart sounds: Normal heart sounds.  Pulmonary:     Effort: Pulmonary effort is normal.     Breath sounds: Normal breath sounds.  Abdominal:     General: Bowel sounds are normal.  Skin:    General: Skin is warm.     Findings: Abscess present. No erythema or rash.          Comments: Small tender hard abscess under skin   Neurological:     General: No focal deficit present.     Mental Status: He is oriented to person, place, and time.  Psychiatric:        Mood and Affect: Mood normal.        Behavior: Behavior normal.     No results found for any visits on 09/06/22.      Assessment & Plan:  Assessed patient's skin.  Tender spot palpated on the right upper back.I advised patient to give skin time to heal after abscess drainage and antibiotic treatment.  Patient verbalized understanding and knows to return if his symptoms are not resolved. Problem List Items Addressed This Visit   None   No orders of the defined types were placed in this encounter.   No follow-ups on file.  Ivy Lynn, NP

## 2022-09-07 ENCOUNTER — Other Ambulatory Visit: Payer: Self-pay | Admitting: Cardiology

## 2022-09-07 NOTE — Telephone Encounter (Signed)
Appointment scheduled.

## 2022-09-07 NOTE — Telephone Encounter (Signed)
R/C to [employer]

## 2022-09-08 ENCOUNTER — Encounter: Payer: Self-pay | Admitting: Family Medicine

## 2022-09-08 ENCOUNTER — Ambulatory Visit: Payer: Medicare HMO | Admitting: Family Medicine

## 2022-09-08 VITALS — BP 109/69 | HR 80 | Temp 97.8°F | Ht 75.0 in | Wt 376.0 lb

## 2022-09-08 DIAGNOSIS — M10041 Idiopathic gout, right hand: Secondary | ICD-10-CM

## 2022-09-08 DIAGNOSIS — L0291 Cutaneous abscess, unspecified: Secondary | ICD-10-CM

## 2022-09-08 MED ORDER — SULFAMETHOXAZOLE-TRIMETHOPRIM 800-160 MG PO TABS: 1.0000 | ORAL_TABLET | Freq: Two times a day (BID) | ORAL | 0 refills | Status: AC

## 2022-09-08 NOTE — Telephone Encounter (Signed)
Called and spoke with patient to verify how he was taking the Torsemide so I could send in more refills.

## 2022-09-08 NOTE — Progress Notes (Signed)
Subjective:  Patient ID: Richard Crawford, male    DOB: 06/17/1956, 66 y.o.   MRN: [id number]  Patient Care Team: Baruch Gouty, FNP as PCP - General (Family Medicine) Minus Breeding, MD as PCP - Cardiology (Cardiology)   Chief Complaint:  Abscess (Right shoulder )   HPI: Richard Crawford is a 66 y.o. male presenting on 09/08/2022 for Abscess (Right shoulder )   Pt presents today for ongoing abscess to right posterior shoulder. Has been treated with antibiotics, no relief of symptoms. Pt would also like uric acid levels rechecked as his finger does bother him at times.   Abscess This is a recurrent problem. The current episode started more than 1 month ago. The problem occurs constantly. The problem has been waxing and waning. Associated symptoms include arthralgias. Pertinent negatives include no abdominal pain, anorexia, change in bowel habit, chest pain, chills, congestion, coughing, diaphoresis, fatigue, fever, headaches, joint swelling, myalgias, nausea, neck pain, numbness, rash, sore throat, swollen glands, urinary symptoms, vertigo, visual change, vomiting or weakness. Exacerbated by: palpation. He has tried acetaminophen and heat (antibiotics) for the symptoms. The treatment provided no relief.     Relevant past medical, surgical, family, and social history reviewed and updated as indicated.  Allergies and medications reviewed and updated. Data reviewed: Chart in Epic.   Past Medical History:  Diagnosis Date   Anxiety    Arthritis    left shoulder   Atrial fibrillation ([place])    Cancer ([place]) 1989   thyroid   Chronic heart failure with preserved ejection fraction (HCC)    Diabetes mellitus without complication (HCC)    Hemorrhoids    external   Hx of colonic polyps    Hyperlipidemia    Hypertension    Low testosterone 12/14/2020   Migraine    Papillary thyroid carcinoma (HCC)    Postoperative hypothyroidism    Primary osteoarthritis of left shoulder    Sleep  apnea    Venous stasis dermatitis of both lower extremities     Past Surgical History:  Procedure Laterality Date   ANKLE FUSION     KNEE ARTHROSCOPY     SHOULDER ARTHROSCOPY     TOTAL SHOULDER REPLACEMENT Right     Social History   Socioeconomic History   Marital status: Married    Spouse name: Not on file   Number of children: Not on file   Years of education: Not on file   Highest education level: Not on file  Occupational History   Occupation: retired  Tobacco Use   Smoking status: Never   Smokeless tobacco: Never  Substance and Sexual Activity   Alcohol use: Not Currently   Drug use: Never   Sexual activity: Not on file  Other Topics Concern   Not on file  Social History Narrative   Lives at home with wife - Originally from [place]; moved here so wife could be closer to her grandchildren (second wife)   Social Determinants of Health   Financial Resource Strain: Low Risk  (03/03/2021)   Overall Financial Resource Strain (CARDIA)    Difficulty of Paying Living Expenses: Not hard at all  Food Insecurity: No Food Insecurity (03/03/2021)   Hunger Vital Sign    Worried About Running Out of Food in the Last Year: Never true    [place] in the Last Year: Never true  Transportation Needs: No Transportation Needs (03/03/2021)   PRAPARE - Hydrologist (Medical): No  Lack of Transportation (Non-Medical): No  Physical Activity: Insufficiently Active (03/03/2021)   Exercise Vital Sign    Days of Exercise per Week: 3 days    Minutes of Exercise per Session: 30 min  Stress: No Stress Concern Present (03/03/2021)   [place]    Feeling of Stress : Not at all  Social Connections: Moderately Isolated (03/03/2021)   Social Connection and Isolation Panel [NHANES]    Frequency of Communication with Friends and Family: Three times a week    Frequency of Social Gatherings with  Friends and Family: Once a week    Attends Religious Services: Never    Marine scientist or Organizations: No    Attends Archivist Meetings: Never    Marital Status: Married  Human resources officer Violence: Not At Risk (03/03/2021)   Humiliation, Afraid, Rape, and Kick questionnaire    Fear of Current or Ex-Partner: No    Emotionally Abused: No    Physically Abused: No    Sexually Abused: No    Outpatient Encounter Medications as of 09/08/2022  Medication Sig   atorvastatin (LIPITOR) 20 MG tablet Take 1 tablet (20 mg total) by mouth daily.   carbamide peroxide (DEBROX) 6.5 % OTIC solution Place 5 drops into both ears 2 (two) times daily.   dapagliflozin propanediol (FARXIGA) 5 MG TABS tablet Take 1 tablet (5 mg total) by mouth daily before breakfast.   DULoxetine (CYMBALTA) 30 MG capsule Take 1 capsule (30 mg total) by mouth daily.   gabapentin (NEURONTIN) 100 MG capsule Take 2 capsules (200 mg total) by mouth 3 (three) times daily.   glucose blood test strip Check BS daily and as needed Dx E11.9   levothyroxine (SYNTHROID) 200 MCG tablet Take 1 tablet (200 mcg total) by mouth daily before breakfast.   levothyroxine (SYNTHROID) 50 MCG tablet TAKE 1 TABLET DAILY BEFORE BREAKFAST. (TAKE IN ADDITION TO 200MCG TABLET FOR TOTAL DOSE 250 MCG)   metoprolol tartrate (LOPRESSOR) 100 MG tablet TAKE (2) TABLETS DAILY   Multiple Vitamin (MULTIVITAMIN) tablet Take 1 tablet by mouth 2 (two) times daily.   Prodigy Lancets 28G MISC Check BS daily and as needed Dx E11.9   Semaglutide (RYBELSUS) 7 MG TABS Take 1 tablet by mouth daily.   sildenafil (VIAGRA) 100 MG tablet Take 0.5-1 tablets (50-100 mg total) by mouth daily as needed for erectile dysfunction.   spironolactone (ALDACTONE) 25 MG tablet Take 1 tablet (25 mg total) by mouth daily.   sulfamethoxazole-trimethoprim (BACTRIM DS) 800-160 MG tablet Take 1 tablet by mouth 2 (two) times daily for 10 days.   SYRINGE-NEEDLE, DISP, 3 ML 20G X  1-1/2" 3 ML MISC 1 each by Does not apply route once a week.   testosterone cypionate (DEPOTESTOSTERONE CYPIONATE) 200 MG/ML injection INJECT 1 ML (200 MG TOTAL) INTO THE MUSCLE EVERY 14 DAYS   valsartan (DIOVAN) 80 MG tablet Take 1 tablet (80 mg total) by mouth daily.   XARELTO 20 MG TABS tablet TAKE 1 TABLET (20 MG TOTAL) BY MOUTH DAILY WITH SUPPER FOR 30 DAYS.   [DISCONTINUED] torsemide 40 MG TABS Take 40 mg by mouth daily.   [DISCONTINUED] predniSONE (STERAPRED UNI-PAK 21 TAB) 10 MG (21) TBPK tablet As directed x 6 days   Facility-Administered Encounter Medications as of 09/08/2022  Medication   testosterone cypionate (DEPOTESTOSTERONE CYPIONATE) injection 200 mg    Allergies  Allergen Reactions   Morphine And Related Nausea And Vomiting and Nausea Only  Review of Systems  Constitutional:  Negative for activity change, appetite change, chills, diaphoresis, fatigue, fever and unexpected weight change.  HENT:  Negative for congestion and sore throat.   Eyes:  Negative for photophobia and visual disturbance.  Respiratory:  Negative for cough and shortness of breath.   Cardiovascular:  Negative for chest pain, palpitations and leg swelling.  Gastrointestinal:  Negative for abdominal pain, anorexia, change in bowel habit, nausea and vomiting.  Genitourinary:  Negative for decreased urine volume and difficulty urinating.  Musculoskeletal:  Positive for arthralgias and back pain. Negative for gait problem, joint swelling, myalgias and neck pain.  Skin:  Positive for color change and wound. Negative for pallor and rash.  Neurological:  Negative for vertigo, weakness, numbness and headaches.  Psychiatric/Behavioral:  Negative for confusion.         Objective:  BP 109/69   Pulse 80   Temp 97.8 F (36.6 C) (Temporal)   Ht '6\' 3"'$  (1.905 m)   Wt (!) 376 lb (170.6 kg)   SpO2 98%   BMI 47.00 kg/m    Wt Readings from Last 3 Encounters:  09/08/22 (!) 376 lb (170.6 kg)  09/06/22 (!)  382 lb (173.3 kg)  08/19/22 (!) 372 lb (168.7 kg)    Physical Exam Vitals and nursing note reviewed.  Constitutional:      General: He is not in acute distress.    Appearance: Normal appearance. He is morbidly obese. He is not ill-appearing, toxic-appearing or diaphoretic.  HENT:     Head: Normocephalic and atraumatic.  Eyes:     Pupils: Pupils are equal, round, and reactive to light.  Cardiovascular:     Rate and Rhythm: Normal rate and regular rhythm.     Heart sounds: Normal heart sounds.  Pulmonary:     Effort: Pulmonary effort is normal.     Breath sounds: Normal breath sounds.  Musculoskeletal:     Comments: Right middle finger with decreased ROM and slight tenderness, no erythema or swelling  Skin:    General: Skin is warm and dry.     Capillary Refill: Capillary refill takes less than 2 seconds.       Neurological:     General: No focal deficit present.     Mental Status: He is alert and oriented to person, place, and time.     Results for orders placed or performed in visit on 08/19/22  Uric acid  Result Value Ref Range   Uric Acid 9.3 (H) 3.8 - 8.4 mg/dL     I & D  Date/Time: 09/08/2022 12:16 PM  Performed by: Baruch Gouty, FNP Authorized by: Baruch Gouty, FNP   Consent:    Consent obtained:  Written   Consent given by:  Patient   Risks, benefits, and alternatives were discussed: yes     Risks discussed:  Bleeding, pain, incomplete drainage, damage to other organs and infection   Alternatives discussed:  No treatment, delayed treatment, alternative treatment and referral Universal protocol:    Procedure explained and questions answered to patient or proxy's satisfaction: yes     Relevant documents present and verified: yes     Immediately prior to procedure a time out was called: yes     Patient identity confirmed:  Verbally with patient Location:    Type:  Abscess   Size:  3   Location:  Trunk   Trunk location:  Back (right  upper) Pre-procedure details:    Skin preparation:  Alcohol and  povidone-iodine Sedation:    Sedation type:  None Anesthesia:    Anesthesia method:  Local infiltration   Local anesthetic:  Lidocaine 2% WITH epi Procedure type:    Complexity:  Complex Procedure details:    Needle aspiration: no     Incision types:  Single straight   Scalpel blade:  11   Wound management:  Probed and deloculated, irrigated with saline and extensive cleaning   Drainage:  Purulent and serosanguinous   Drainage amount:  Copious   Wound treatment:  Wound left open   Packing materials:  None Post-procedure details:    Procedure completion:  Tolerated well, no immediate complications    Pertinent labs & imaging results that were available during my care of the patient were reviewed by me and considered in my medical decision making.  Assessment & Plan:  Richard Crawford was seen today for abscess.  Diagnoses and all orders for this visit:  Abscess I&D in office. Tolerated well. Significant amount of purulent drainage returned. Wound care discussed in detail. Medications as prescribed. Report new, worsening, or persistent symptoms.  -     sulfamethoxazole-trimethoprim (BACTRIM DS) 800-160 MG tablet; Take 1 tablet by mouth 2 (two) times daily for 10 days. -     I & D  Acute idiopathic gout of right hand Will recheck uric acid level today and start preventative therapy if warranted.  -     Uric acid     Continue all other maintenance medications.  Follow up plan: Return if symptoms worsen or fail to improve.   Continue healthy lifestyle choices, including diet (rich in fruits, vegetables, and lean proteins, and low in salt and simple carbohydrates) and exercise (at least 30 minutes of moderate physical activity daily).  Educational handout given for abscess   The above assessment and management plan was discussed with the patient. The patient verbalized understanding of and has agreed to the  management plan. Patient is aware to call the clinic if they develop any new symptoms or if symptoms persist or worsen. Patient is aware when to return to the clinic for a follow-up visit. Patient educated on when it is appropriate to go to the emergency department.   Monia Pouch, FNP-C [facility] [phone]

## 2022-09-09 LAB — URIC ACID: Uric Acid: 6.7 mg/dL (ref 3.8–8.4)

## 2022-09-15 LAB — HM DIABETES EYE EXAM

## 2022-09-16 ENCOUNTER — Ambulatory Visit (INDEPENDENT_AMBULATORY_CARE_PROVIDER_SITE_OTHER): Payer: Medicare HMO | Admitting: *Deleted

## 2022-09-16 DIAGNOSIS — E291 Testicular hypofunction: Secondary | ICD-10-CM

## 2022-09-16 DIAGNOSIS — R7989 Other specified abnormal findings of blood chemistry: Secondary | ICD-10-CM

## 2022-09-21 ENCOUNTER — Encounter: Payer: Self-pay | Admitting: Family Medicine

## 2022-09-22 ENCOUNTER — Other Ambulatory Visit: Payer: Self-pay | Admitting: Family Medicine

## 2022-09-22 DIAGNOSIS — M10041 Idiopathic gout, right hand: Secondary | ICD-10-CM

## 2022-09-22 MED ORDER — PREDNISONE 20 MG PO TABS: ORAL_TABLET | ORAL | 0 refills | Status: DC

## 2022-09-28 ENCOUNTER — Other Ambulatory Visit: Payer: Self-pay | Admitting: Family Medicine

## 2022-09-28 DIAGNOSIS — I4821 Permanent atrial fibrillation: Secondary | ICD-10-CM

## 2022-09-30 ENCOUNTER — Ambulatory Visit (INDEPENDENT_AMBULATORY_CARE_PROVIDER_SITE_OTHER): Payer: Medicare HMO

## 2022-09-30 DIAGNOSIS — E291 Testicular hypofunction: Secondary | ICD-10-CM | POA: Diagnosis not present

## 2022-09-30 NOTE — Progress Notes (Signed)
Testosterone injection given to left thigh, patient tolerated well.

## 2022-10-05 ENCOUNTER — Encounter (INDEPENDENT_AMBULATORY_CARE_PROVIDER_SITE_OTHER): Payer: Medicare HMO | Admitting: Family Medicine

## 2022-10-05 DIAGNOSIS — M10041 Idiopathic gout, right hand: Secondary | ICD-10-CM

## 2022-10-06 MED ORDER — PREDNISONE 20 MG PO TABS: 40.0000 mg | ORAL_TABLET | Freq: Every day | ORAL | 0 refills | Status: AC

## 2022-10-06 MED ORDER — ALLOPURINOL 100 MG PO TABS: 100.0000 mg | ORAL_TABLET | Freq: Every day | ORAL | 6 refills | Status: DC

## 2022-10-06 NOTE — Telephone Encounter (Signed)
Please see the MyChart message reply(ies) for my assessment and plan.    This patient gave consent for this Medical Advice Message and is aware that it may result in a bill to [employer], as well as the possibility of receiving a bill for a co-payment or deductible. They are an established patient, but are not seeking medical advice exclusively about a problem treated during an in person or video visit in the last seven days. I did not recommend an in person or video visit within seven days of my reply.    I spent a total of 12 minutes cumulative time within 7 days through [employer].   Diagnoses and all orders for this visit:  Acute idiopathic gout of right hand -     allopurinol (ZYLOPRIM) 100 MG tablet; Take 1 tablet (100 mg total) by mouth daily. -     predniSONE (DELTASONE) 20 MG tablet; Take 2 tablets (40 mg total) by mouth daily with breakfast for 5 days. 2 po daily for 5 days    Monia Pouch, FNP

## 2022-10-14 ENCOUNTER — Ambulatory Visit (INDEPENDENT_AMBULATORY_CARE_PROVIDER_SITE_OTHER): Payer: Medicare HMO | Admitting: *Deleted

## 2022-10-14 DIAGNOSIS — E291 Testicular hypofunction: Secondary | ICD-10-CM

## 2022-10-14 NOTE — Progress Notes (Signed)
Testosterone injection given right anterior thigh, intramuscular. Patient tolerated well

## 2022-10-20 ENCOUNTER — Ambulatory Visit: Payer: Medicare HMO | Admitting: Family Medicine

## 2022-10-28 ENCOUNTER — Ambulatory Visit (INDEPENDENT_AMBULATORY_CARE_PROVIDER_SITE_OTHER): Payer: Medicare HMO

## 2022-10-28 DIAGNOSIS — E291 Testicular hypofunction: Secondary | ICD-10-CM

## 2022-11-05 ENCOUNTER — Encounter: Payer: Self-pay | Admitting: Family Medicine

## 2022-11-05 NOTE — Telephone Encounter (Signed)
Needs appt

## 2022-11-09 ENCOUNTER — Other Ambulatory Visit: Payer: Self-pay | Admitting: Family Medicine

## 2022-11-09 DIAGNOSIS — M10041 Idiopathic gout, right hand: Secondary | ICD-10-CM

## 2022-11-09 MED ORDER — PREDNISONE 20 MG PO TABS: 40.0000 mg | ORAL_TABLET | Freq: Every day | ORAL | 0 refills | Status: AC

## 2022-11-11 ENCOUNTER — Ambulatory Visit (INDEPENDENT_AMBULATORY_CARE_PROVIDER_SITE_OTHER): Payer: Medicare HMO | Admitting: *Deleted

## 2022-11-11 DIAGNOSIS — E291 Testicular hypofunction: Secondary | ICD-10-CM

## 2022-11-11 NOTE — Progress Notes (Signed)
Testosterone injection given left thigh intramuscular. Patient tolerated well.

## 2022-11-17 ENCOUNTER — Encounter: Payer: Self-pay | Admitting: Family Medicine

## 2022-11-17 ENCOUNTER — Ambulatory Visit: Payer: Medicare HMO | Admitting: Family Medicine

## 2022-11-17 VITALS — BP 124/79 | HR 107 | Temp 98.3°F | Ht 75.0 in | Wt 364.8 lb

## 2022-11-17 DIAGNOSIS — I152 Hypertension secondary to endocrine disorders: Secondary | ICD-10-CM

## 2022-11-17 DIAGNOSIS — I5032 Chronic diastolic (congestive) heart failure: Secondary | ICD-10-CM | POA: Diagnosis not present

## 2022-11-17 DIAGNOSIS — E291 Testicular hypofunction: Secondary | ICD-10-CM

## 2022-11-17 DIAGNOSIS — E1169 Type 2 diabetes mellitus with other specified complication: Secondary | ICD-10-CM

## 2022-11-17 DIAGNOSIS — E1159 Type 2 diabetes mellitus with other circulatory complications: Secondary | ICD-10-CM

## 2022-11-17 LAB — CMP14+EGFR
ALT: 25 IU/L (ref 0–44)
AST: 23 IU/L (ref 0–40)
Albumin/Globulin Ratio: 1.4 (ref 1.2–2.2)
Albumin: 4.5 g/dL (ref 3.9–4.9)
Alkaline Phosphatase: 158 IU/L — ABNORMAL HIGH (ref 44–121)
BUN/Creatinine Ratio: 13 (ref 10–24)
BUN: 16 mg/dL (ref 8–27)
Bilirubin Total: 0.9 mg/dL (ref 0.0–1.2)
CO2: 24 mmol/L (ref 20–29)
Calcium: 9.5 mg/dL (ref 8.6–10.2)
Chloride: 101 mmol/L (ref 96–106)
Creatinine, Ser: 1.26 mg/dL (ref 0.76–1.27)
Globulin, Total: 3.2 g/dL (ref 1.5–4.5)
Glucose: 119 mg/dL — ABNORMAL HIGH (ref 70–99)
Potassium: 4.6 mmol/L (ref 3.5–5.2)
Sodium: 141 mmol/L (ref 134–144)
Total Protein: 7.7 g/dL (ref 6.0–8.5)
eGFR: 62 mL/min/{1.73_m2} (ref >59)

## 2022-11-17 LAB — CBC WITH DIFFERENTIAL/PLATELET
Basophils Absolute: 0.1 10*3/uL (ref 0.0–0.2)
Basos: 1 %
EOS (ABSOLUTE): 0.2 10*3/uL (ref 0.0–0.4)
Eos: 2 %
Hematocrit: 48.9 % (ref 37.5–51.0)
Hemoglobin: 15.2 g/dL (ref 13.0–17.7)
Immature Grans (Abs): 0 10*3/uL (ref 0.0–0.1)
Immature Granulocytes: 0 %
Lymphocytes Absolute: 3.2 10*3/uL — ABNORMAL HIGH (ref 0.7–3.1)
Lymphs: 34 %
MCH: 25.1 pg — ABNORMAL LOW (ref 26.6–33.0)
MCHC: 31.1 g/dL — ABNORMAL LOW (ref 31.5–35.7)
MCV: 81 fL (ref 79–97)
Monocytes Absolute: 1.2 10*3/uL — ABNORMAL HIGH (ref 0.1–0.9)
Monocytes: 13 %
Neutrophils Absolute: 4.7 10*3/uL (ref 1.4–7.0)
Neutrophils: 50 %
Platelets: 260 10*3/uL (ref 150–450)
RBC: 6.05 x10E6/uL — ABNORMAL HIGH (ref 4.14–5.80)
RDW: 15.4 % (ref 11.6–15.4)
WBC: 9.4 10*3/uL (ref 3.4–10.8)

## 2022-11-17 LAB — BAYER DCA HB A1C WAIVED: HB A1C (BAYER DCA - WAIVED): 7.1 % — ABNORMAL HIGH (ref 4.8–5.6)

## 2022-11-17 MED ORDER — ATORVASTATIN CALCIUM 20 MG PO TABS: 20.0000 mg | ORAL_TABLET | Freq: Every day | ORAL | 1 refills | Status: DC

## 2022-11-17 MED ORDER — VALSARTAN 80 MG PO TABS: 80.0000 mg | ORAL_TABLET | Freq: Every day | ORAL | 2 refills | Status: DC

## 2022-11-17 MED ORDER — SPIRONOLACTONE 25 MG PO TABS: 25.0000 mg | ORAL_TABLET | Freq: Every day | ORAL | 2 refills | Status: DC

## 2022-11-17 MED ORDER — METOPROLOL TARTRATE 100 MG PO TABS: ORAL_TABLET | ORAL | 2 refills | Status: DC

## 2022-11-17 MED ORDER — TESTOSTERONE CYPIONATE 200 MG/ML IM SOLN: INTRAMUSCULAR | 0 refills | Status: DC

## 2022-11-17 NOTE — Progress Notes (Signed)
Subjective:    Patient ID: Richard Crawford, male    DOB: 12/02/1955, 67 y.o.   MRN: [id number]  Chief Complaint:  Diabetes (3 month follow up /)  Health maintenance:  Patient made aware that he is due for his Tdap and Shingrix and declines these immunizations at this time. He is up to date on his eye exxam.   HPI: Richard Crawford is a 67 y.o. male presenting on 11/17/2022 for Diabetes (3 month follow up /)  Hypertension  Hyperlipidemia  Type 2 Diabetes Patient continues to be compliant with his medications, including atorvastatin, dapagliflozin, metoprolol, semaglutide, spironolactone, valsartan, and torsemide. He does not report chest pain. Notes occasional shortness of breath with exertion. No lower extremity edema is noted. He has not made any changes to diet or exercise and reports that he does not eat vegetables. He does not check blood pressures at home but does check sugars daily, reporting that they are usually around 109-112 in the morning (fasting). The patient does not report occasional lightheadedness. He also denies changes to vision, headaches, numbness, or tingling in lower extremities. Does not report hypoglycemic episodes, constipation, abdominal pain, muscle pain, or food regurgitation. Reports that he stopped taking his spironolactone and torsemide due to urinary urgency and frequency.   1. Type 2 diabetes mellitus with other specified complication, without long-term current use of insulin ([place])   2. Hypertension associated with diabetes ([place])   3. Hyperlipidemia associated with type 2 diabetes mellitus ([place])   4. Chronic heart failure with preserved ejection fraction (HCC)   5. Testosterone deficiency in male     Outpatient Encounter Medications as of 11/17/2022  Medication Sig   allopurinol (ZYLOPRIM) 100 MG tablet Take 1 tablet (100 mg total) by mouth daily.   carbamide peroxide (DEBROX) 6.5 % OTIC solution Place 5 drops into both ears 2 (two) times daily.    dapagliflozin propanediol (FARXIGA) 5 MG TABS tablet Take 1 tablet (5 mg total) by mouth daily before breakfast.   DULoxetine (CYMBALTA) 30 MG capsule Take 1 capsule (30 mg total) by mouth daily.   gabapentin (NEURONTIN) 100 MG capsule Take 2 capsules (200 mg total) by mouth 3 (three) times daily.   glucose blood test strip Check BS daily and as needed Dx E11.9   levothyroxine (SYNTHROID) 200 MCG tablet Take 1 tablet (200 mcg total) by mouth daily before breakfast.   levothyroxine (SYNTHROID) 50 MCG tablet TAKE 1 TABLET DAILY BEFORE BREAKFAST. (TAKE IN ADDITION TO 200MCG TABLET FOR TOTAL DOSE 250 MCG)   Multiple Vitamin (MULTIVITAMIN) tablet Take 1 tablet by mouth 2 (two) times daily.   Prodigy Lancets 28G MISC Check BS daily and as needed Dx E11.9   Semaglutide (RYBELSUS) 7 MG TABS Take 1 tablet by mouth daily.   sildenafil (VIAGRA) 100 MG tablet Take 0.5-1 tablets (50-100 mg total) by mouth daily as needed for erectile dysfunction.   SYRINGE-NEEDLE, DISP, 3 ML 20G X 1-1/2" 3 ML MISC 1 each by Does not apply route once a week.   torsemide (DEMADEX) 20 MG tablet TAKE TWO TABLETS ('40MG'$ ) DAILY AS DIRECTED   XARELTO 20 MG TABS tablet TAKE 1 TABLET (20 MG TOTAL) BY MOUTH DAILY WITH SUPPER FOR 30 DAYS.   [DISCONTINUED] atorvastatin (LIPITOR) 20 MG tablet Take 1 tablet (20 mg total) by mouth daily.   [DISCONTINUED] metoprolol tartrate (LOPRESSOR) 100 MG tablet TAKE (2) TABLETS DAILY   [DISCONTINUED] spironolactone (ALDACTONE) 25 MG tablet Take 1 tablet (25 mg total) by mouth daily.   [  DISCONTINUED] testosterone cypionate (DEPOTESTOSTERONE CYPIONATE) 200 MG/ML injection INJECT 1 ML (200 MG TOTAL) INTO THE MUSCLE EVERY 14 DAYS   [DISCONTINUED] valsartan (DIOVAN) 80 MG tablet Take 1 tablet (80 mg total) by mouth daily.   atorvastatin (LIPITOR) 20 MG tablet Take 1 tablet (20 mg total) by mouth daily.   metoprolol tartrate (LOPRESSOR) 100 MG tablet TAKE (2) TABLETS DAILY   spironolactone (ALDACTONE) 25 MG  tablet Take 1 tablet (25 mg total) by mouth daily.   testosterone cypionate (DEPOTESTOSTERONE CYPIONATE) 200 MG/ML injection INJECT 1 ML (200 MG TOTAL) INTO THE MUSCLE EVERY 14 DAYS   valsartan (DIOVAN) 80 MG tablet Take 1 tablet (80 mg total) by mouth daily.   Facility-Administered Encounter Medications as of 11/17/2022  Medication   testosterone cypionate (DEPOTESTOSTERONE CYPIONATE) injection 200 mg   Social history:  Relevant past medical, surgical, family and social history reviewed and updated as indicated. Interim medical history since our last visit reviewed. Allergies and medications reviewed and updated.  Family History reviewed for pertinent findings.  Past Medical History:  Diagnosis Date   Anxiety    Arthritis    left shoulder   Atrial fibrillation ([place])    Cancer ([place]) 1989   thyroid   Chronic heart failure with preserved ejection fraction (HCC)    Diabetes mellitus without complication (HCC)    Hemorrhoids    external   Hx of colonic polyps    Hyperlipidemia    Hypertension    Low testosterone 12/14/2020   Migraine    Papillary thyroid carcinoma (HCC)    Postoperative hypothyroidism    Primary osteoarthritis of left shoulder    Sleep apnea    Venous stasis dermatitis of both lower extremities     Past Surgical History:  Procedure Laterality Date   ANKLE FUSION     KNEE ARTHROSCOPY     SHOULDER ARTHROSCOPY     TOTAL SHOULDER REPLACEMENT Right     Social History   Socioeconomic History   Marital status: Married    Spouse name: Not on file   Number of children: Not on file   Years of education: Not on file   Highest education level: Not on file  Occupational History   Occupation: retired  Tobacco Use   Smoking status: Never   Smokeless tobacco: Never  Substance and Sexual Activity   Alcohol use: Not Currently   Drug use: Never   Sexual activity: Not on file  Other Topics Concern   Not on file  Social History Narrative   Lives at home with  wife - Originally from [place]; moved here so wife could be closer to her grandchildren (second wife)   Social Determinants of Health   Financial Resource Strain: Low Risk  (03/03/2021)   Overall Financial Resource Strain (CARDIA)    Difficulty of Paying Living Expenses: Not hard at all  Food Insecurity: No Food Insecurity (03/03/2021)   Hunger Vital Sign    Worried About Running Out of Food in the Last Year: Never true    [place] in the Last Year: Never true  Transportation Needs: No Transportation Needs (03/03/2021)   PRAPARE - Hydrologist (Medical): No    Lack of Transportation (Non-Medical): No  Physical Activity: Insufficiently Active (03/03/2021)   Exercise Vital Sign    Days of Exercise per Week: 3 days    Minutes of Exercise per Session: 30 min  Stress: No Stress Concern Present (03/03/2021)   [employer]  of Occupational Health - Occupational Stress Questionnaire    Feeling of Stress : Not at all  Social Connections: Moderately Isolated (03/03/2021)   Social Connection and Isolation Panel [NHANES]    Frequency of Communication with Friends and Family: Three times a week    Frequency of Social Gatherings with Friends and Family: Once a week    Attends Religious Services: Never    Marine scientist or Organizations: No    Attends Archivist Meetings: Never    Marital Status: Married  Human resources officer Violence: Not At Risk (03/03/2021)   Humiliation, Afraid, Rape, and Kick questionnaire    Fear of Current or Ex-Partner: No    Emotionally Abused: No    Physically Abused: No    Sexually Abused: No    Allergies as of 11/17/2022       Reactions   Morphine And Related Nausea And Vomiting, Nausea Only        Medication List        Accurate as of November 17, 2022  8:53 AM. If you have any questions, ask your nurse or doctor.          allopurinol 100 MG tablet Commonly known as: ZYLOPRIM Take 1 tablet (100 mg  total) by mouth daily.   atorvastatin 20 MG tablet Commonly known as: LIPITOR Take 1 tablet (20 mg total) by mouth daily.   dapagliflozin propanediol 5 MG Tabs tablet Commonly known as: Farxiga Take 1 tablet (5 mg total) by mouth daily before breakfast.   Debrox 6.5 % OTIC solution Generic drug: carbamide peroxide Place 5 drops into both ears 2 (two) times daily.   DULoxetine 30 MG capsule Commonly known as: Cymbalta Take 1 capsule (30 mg total) by mouth daily.   gabapentin 100 MG capsule Commonly known as: NEURONTIN Take 2 capsules (200 mg total) by mouth 3 (three) times daily.   glucose blood test strip Check BS daily and as needed Dx E11.9   levothyroxine 50 MCG tablet Commonly known as: SYNTHROID TAKE 1 TABLET DAILY BEFORE BREAKFAST. (TAKE IN ADDITION TO 200MCG TABLET FOR TOTAL DOSE 250 MCG)   levothyroxine 200 MCG tablet Commonly known as: SYNTHROID Take 1 tablet (200 mcg total) by mouth daily before breakfast.   metoprolol tartrate 100 MG tablet Commonly known as: LOPRESSOR TAKE (2) TABLETS DAILY   multivitamin tablet Take 1 tablet by mouth 2 (two) times daily.   Prodigy Lancets 28G Misc Check BS daily and as needed Dx E11.9   Rybelsus 7 MG Tabs Generic drug: Semaglutide Take 1 tablet by mouth daily.   sildenafil 100 MG tablet Commonly known as: Viagra Take 0.5-1 tablets (50-100 mg total) by mouth daily as needed for erectile dysfunction.   spironolactone 25 MG tablet Commonly known as: ALDACTONE Take 1 tablet (25 mg total) by mouth daily.   SYRINGE-NEEDLE (DISP) 3 ML 20G X 1-1/2" 3 ML Misc 1 each by Does not apply route once a week.   testosterone cypionate 200 MG/ML injection Commonly known as: DEPOTESTOSTERONE CYPIONATE INJECT 1 ML (200 MG TOTAL) INTO THE MUSCLE EVERY 14 DAYS   torsemide 20 MG tablet Commonly known as: DEMADEX TAKE TWO TABLETS ('40MG'$ ) DAILY AS DIRECTED   valsartan 80 MG tablet Commonly known as: DIOVAN Take 1 tablet (80 mg  total) by mouth daily.   Xarelto 20 MG Tabs tablet Generic drug: rivaroxaban TAKE 1 TABLET (20 MG TOTAL) BY MOUTH DAILY WITH SUPPER FOR 30 DAYS.  Allergies  Allergen Reactions   Morphine And Related Nausea And Vomiting and Nausea Only    Review of Systems Review of Systems  Constitutional:  Negative for activity change, appetite change, chills, diaphoresis, fatigue, fever and unexpected weight change.  Respiratory:  Negative for cough, choking, chest tightness and shortness of breath.   Cardiovascular:  Negative for chest pain, palpitations and leg swelling.  Gastrointestinal:  Negative for blood in stool, constipation, diarrhea and nausea.  Endocrine: Negative for cold intolerance and heat intolerance.  Genitourinary:  Negative for difficulty urinating, dysuria, or urgency  Musculoskeletal:  Negative for arthralgias and back pain.  Skin:  Negative for color change and pallor.  Neurological:  Negative for dizziness, numbness and headaches.   Objective:    BP 124/79   Pulse (!) 107   Temp 98.3 F (36.8 C) (Temporal)   Ht '6\' 3"'$  (1.905 m)   Wt (!) 364 lb 12.8 oz (165.5 kg)   SpO2 96%   BMI 45.60 kg/m    Wt Readings from Last 3 Encounters:  11/17/22 (!) 364 lb 12.8 oz (165.5 kg)  09/08/22 (!) 376 lb (170.6 kg)  09/06/22 (!) 382 lb (173.3 kg)    Physical Exam Physical Examination:  General: Awake, alert, well nourished, No acute distress Cardio: regular rate and rhythm, S1S2 heard, no murmurs appreciated Pulm: clear to auscultation bilaterally, no wheezes, rhonchi or rales; normal work of breathing on room air Extremities: warm, well perfused, No edema, cyanosis or clubbing; +1 pulses bilaterally Skin: dry; intact; no rashes or lesions  Results for orders placed or performed in visit on 09/22/22  HM DIABETES EYE EXAM  Result Value Ref Range   HM Diabetic Eye Exam No Retinopathy No Retinopathy      Assessment & Plan:  Richard Crawford comes in today with chief  complaint of Diabetes (3 month follow up /)  1. Type 2 diabetes mellitus with other specified complication, without long-term current use of insulin (HCC) Patient's A1c is 7.1 today, slightly decreased from 7.2 previously. Patient is compliant with his diabetes medications. Has seen a 20 lb weight loss, discussed that this is likely secondary to his medications. Counseled patient that his fasting glucose levels are within range. No changes in medications needed at this time.   2. Hypertension associated with diabetes ([place])  Systolic Heart Failure  Patient has not been compliant with his diuretic medications. Counseled patient that he should continue taking his spironolactone given the benefit to heart failure mortality and symptomology. Patient was amenable to this plan.  - CMP14+EGFR - CBC with Differential/Platelet  Follow up plan: Return in 3 months (on 02/15/2023) for schedule AWV, chronic follow up.  Labs pending Health Maintenance reviewed Diet and exercise encouraged  The above assessment and management plan was discussed with the patient. The patient verbalized understanding of and has agreed to the management plan. Patient is aware to call the clinic if symptoms persist or worsen. Patient is aware when to return to the clinic for a follow-up visit. Patient educated on when it is appropriate to go to the emergency department.   Stephani Police, MS3

## 2022-11-17 NOTE — Patient Instructions (Addendum)

## 2022-11-18 ENCOUNTER — Encounter: Payer: Self-pay | Admitting: Family Medicine

## 2022-11-19 LAB — SPECIMEN STATUS REPORT

## 2022-11-19 LAB — TESTOSTERONE,FREE AND TOTAL
Testosterone, Free: 15.9 pg/mL (ref 6.6–18.1)
Testosterone: 889 ng/dL (ref 264–916)

## 2022-11-23 ENCOUNTER — Other Ambulatory Visit: Payer: Self-pay | Admitting: Family Medicine

## 2022-11-23 DIAGNOSIS — E291 Testicular hypofunction: Secondary | ICD-10-CM

## 2022-11-23 MED ORDER — TESTOSTERONE CYPIONATE 200 MG/ML IM SOLN: INTRAMUSCULAR | 0 refills | Status: DC

## 2022-11-23 NOTE — Progress Notes (Signed)
Testosterone dosing changed.

## 2022-11-25 ENCOUNTER — Ambulatory Visit: Payer: Medicare HMO

## 2022-11-26 ENCOUNTER — Ambulatory Visit (INDEPENDENT_AMBULATORY_CARE_PROVIDER_SITE_OTHER): Payer: Medicare HMO

## 2022-11-26 DIAGNOSIS — E291 Testicular hypofunction: Secondary | ICD-10-CM | POA: Diagnosis not present

## 2022-11-26 NOTE — Progress Notes (Signed)
Testosterone injection given to patient in right thigh per patient request.  Patient tolerated well. Dose was changed to 0.75 ml per Monia Pouch on patient's latest lab results.

## 2022-12-09 ENCOUNTER — Encounter: Payer: Self-pay | Admitting: Radiology

## 2022-12-09 ENCOUNTER — Ambulatory Visit (INDEPENDENT_AMBULATORY_CARE_PROVIDER_SITE_OTHER): Payer: Medicare HMO

## 2022-12-09 DIAGNOSIS — E291 Testicular hypofunction: Secondary | ICD-10-CM | POA: Diagnosis not present

## 2022-12-09 NOTE — Progress Notes (Signed)
Testosterone injection given to left thigh per patient request.  Patient tolerated well.

## 2022-12-14 ENCOUNTER — Encounter: Payer: Self-pay | Admitting: *Deleted

## 2022-12-23 ENCOUNTER — Ambulatory Visit (INDEPENDENT_AMBULATORY_CARE_PROVIDER_SITE_OTHER): Payer: Medicare HMO

## 2022-12-23 ENCOUNTER — Ambulatory Visit: Payer: Medicare HMO

## 2022-12-23 DIAGNOSIS — E291 Testicular hypofunction: Secondary | ICD-10-CM

## 2022-12-23 NOTE — Progress Notes (Signed)
Testosterone injection given to right thigh per patient request.  Patient tolerated well. 

## 2022-12-29 ENCOUNTER — Other Ambulatory Visit: Payer: Self-pay | Admitting: Family Medicine

## 2022-12-29 DIAGNOSIS — I4821 Permanent atrial fibrillation: Secondary | ICD-10-CM

## 2023-01-06 ENCOUNTER — Other Ambulatory Visit: Payer: Self-pay

## 2023-01-06 ENCOUNTER — Ambulatory Visit (INDEPENDENT_AMBULATORY_CARE_PROVIDER_SITE_OTHER): Payer: Medicare HMO

## 2023-01-06 DIAGNOSIS — E291 Testicular hypofunction: Secondary | ICD-10-CM

## 2023-01-06 MED ORDER — TESTOSTERONE CYPIONATE 200 MG/ML IM SOLN
175.0000 mg | INTRAMUSCULAR | Status: AC
  Administered 2023-01-20 – 2023-03-17 (×5): 175 mg via INTRAMUSCULAR
  Administered 2023-04-28: 150 mg via INTRAMUSCULAR
  Administered 2023-05-12 – 2023-11-10 (×14): 175 mg via INTRAMUSCULAR

## 2023-01-06 NOTE — Progress Notes (Signed)
Testosterone injection given to left upper thigh per patient request.  Patient tolerated well.

## 2023-01-20 ENCOUNTER — Ambulatory Visit (INDEPENDENT_AMBULATORY_CARE_PROVIDER_SITE_OTHER): Payer: Medicare HMO | Admitting: *Deleted

## 2023-01-20 DIAGNOSIS — E291 Testicular hypofunction: Secondary | ICD-10-CM

## 2023-01-20 NOTE — Progress Notes (Signed)
Testosterone inj given right thigh per pt's request. Pt tolerated well.

## 2023-01-23 ENCOUNTER — Encounter: Payer: Self-pay | Admitting: Family Medicine

## 2023-01-24 ENCOUNTER — Ambulatory Visit: Payer: Medicare HMO

## 2023-01-25 ENCOUNTER — Ambulatory Visit: Payer: Medicare HMO | Admitting: Family Medicine

## 2023-01-25 ENCOUNTER — Encounter: Payer: Self-pay | Admitting: Family Medicine

## 2023-01-25 VITALS — BP 127/72 | HR 100 | Temp 98.3°F | Ht 75.0 in | Wt 370.0 lb

## 2023-01-25 DIAGNOSIS — L729 Follicular cyst of the skin and subcutaneous tissue, unspecified: Secondary | ICD-10-CM | POA: Diagnosis not present

## 2023-01-25 DIAGNOSIS — L089 Local infection of the skin and subcutaneous tissue, unspecified: Secondary | ICD-10-CM

## 2023-01-25 MED ORDER — CEFTRIAXONE SODIUM 1 G IJ SOLR
1.0000 g | Freq: Once | INTRAMUSCULAR | Status: AC
  Administered 2023-01-25: 1 g via INTRAMUSCULAR

## 2023-01-25 MED ORDER — DOXYCYCLINE HYCLATE 100 MG PO TABS: 100.0000 mg | ORAL_TABLET | Freq: Two times a day (BID) | ORAL | 0 refills | Status: AC

## 2023-01-25 NOTE — Progress Notes (Signed)
Subjective: [id number] facial cyst PCP: Sonny Masters, FNP [id number] Tippy is a 67 y.o. male presenting to clinic today for:  1. Infected facial cyst Patient reports development of cyst a couple of weeks ago.  It started draining on its own yesterday after he squeezed it in the shower.  No fevers, chills, difficulty opening/ closing mouth.  Has been using nothing topically. Had a similar lesion on his back which was removed by PCP.    ROS: Per HPI  Allergies  Allergen Reactions   Morphine And Related Nausea And Vomiting and Nausea Only   Past Medical History:  Diagnosis Date   Anxiety    Arthritis    left shoulder   Atrial fibrillation    Cancer 1989   thyroid   Chronic heart failure with preserved ejection fraction    Diabetes mellitus without complication    Hemorrhoids    external   Hx of colonic polyps    Hyperlipidemia    Hypertension    Low testosterone 12/14/2020   Migraine    Papillary thyroid carcinoma    Postoperative hypothyroidism    Primary osteoarthritis of left shoulder    Sleep apnea    Venous stasis dermatitis of both lower extremities     Current Outpatient Medications:    allopurinol (ZYLOPRIM) 100 MG tablet, Take 1 tablet (100 mg total) by mouth daily., Disp: 30 tablet, Rfl: 6   atorvastatin (LIPITOR) 20 MG tablet, Take 1 tablet (20 mg total) by mouth daily., Disp: 90 tablet, Rfl: 1   carbamide peroxide (DEBROX) 6.5 % OTIC solution, Place 5 drops into both ears 2 (two) times daily., Disp: 15 mL, Rfl: 0   dapagliflozin propanediol (FARXIGA) 5 MG TABS tablet, Take 1 tablet (5 mg total) by mouth daily before breakfast., Disp: 90 tablet, Rfl: 1   DULoxetine (CYMBALTA) 30 MG capsule, Take 1 capsule (30 mg total) by mouth daily., Disp: 90 capsule, Rfl: 3   gabapentin (NEURONTIN) 100 MG capsule, Take 2 capsules (200 mg total) by mouth 3 (three) times daily., Disp: 270 capsule, Rfl: 2   glucose blood test strip, Check BS daily and as needed Dx E11.9,  Disp: 100 each, Rfl: 3   levothyroxine (SYNTHROID) 200 MCG tablet, Take 1 tablet (200 mcg total) by mouth daily before breakfast., Disp: 90 tablet, Rfl: 1   levothyroxine (SYNTHROID) 50 MCG tablet, TAKE 1 TABLET DAILY BEFORE BREAKFAST. (TAKE IN ADDITION TO TABLET FOR TOTAL DOSE 250 MCG), Disp: 90 tablet, Rfl: 1   metoprolol tartrate (LOPRESSOR) 100 MG tablet, TAKE (2) TABLETS DAILY, Disp: 180 tablet, Rfl: 2   Multiple Vitamin (MULTIVITAMIN) tablet, Take 1 tablet by mouth 2 (two) times daily., Disp: , Rfl:    Prodigy Lancets 28G MISC, Check BS daily and as needed Dx E11.9, Disp: 100 each, Rfl: 3   Semaglutide (RYBELSUS) 7 MG TABS, Take 1 tablet by mouth daily., Disp: 90 tablet, Rfl: 3   sildenafil (VIAGRA) 100 MG tablet, Take 0.5-1 tablets (50-100 mg total) by mouth daily as needed for erectile dysfunction., Disp: 5 tablet, Rfl: 11   SYRINGE-NEEDLE, DISP, 3 ML 20G X 1-1/2" 3 ML MISC, 1 each by Does not apply route once a week., Disp: 50 each, Rfl: 0   testosterone cypionate (DEPOTESTOSTERONE CYPIONATE) 200 MG/ML injection, INJECT 0.75 ML (175 MG TOTAL) INTO THE MUSCLE EVERY 14 DAYS, Disp: 6 mL, Rfl: 0   valsartan (DIOVAN) 80 MG tablet, Take 1 tablet (80 mg total) by mouth daily., Disp: 90 tablet,  Rfl: 2   XARELTO 20 MG TABS tablet, TAKE 1 TABLET (20 MG TOTAL) BY MOUTH DAILY WITH SUPPER FOR 30 DAYS., Disp: 90 tablet, Rfl: 0  Current Facility-Administered Medications:    testosterone cypionate (DEPOTESTOSTERONE CYPIONATE) injection 175 mg, 175 mg, Intramuscular, Q14 Days, Sonny Masters, FNP, 175 mg at 01/20/23 1433 Social History   Socioeconomic History   Marital status: Married    Spouse name: Not on file   Number of children: Not on file   Years of education: Not on file   Highest education level: 12th grade  Occupational History   Occupation: retired  Tobacco Use   Smoking status: Never   Smokeless tobacco: Never  Substance and Sexual Activity   Alcohol use: Not Currently   Drug  use: Never   Sexual activity: Not on file  Other Topics Concern   Not on file  Social History Narrative   Lives at home with wife - Originally from [place]; moved here so wife could be closer to her grandchildren (second wife)   Social Determinants of Health   Financial Resource Strain: Low Risk  (01/24/2023)   Overall Financial Resource Strain (CARDIA)    Difficulty of Paying Living Expenses: Not very hard  Food Insecurity: No Food Insecurity (01/24/2023)   Hunger Vital Sign    Worried About Running Out of Food in the Last Year: Never true    Ran Out of Food in the Last Year: Never true  Transportation Needs: No Transportation Needs (01/24/2023)   PRAPARE - Administrator, Civil Service (Medical): No    Lack of Transportation (Non-Medical): No  Physical Activity: Unknown (01/24/2023)   Exercise Vital Sign    Days of Exercise per Week: 0 days    Minutes of Exercise per Session: Not on file  Stress: No Stress Concern Present (01/24/2023)   [employer] of Occupational Health - Occupational Stress Questionnaire    Feeling of Stress : Only a little  Social Connections: Socially Isolated (01/24/2023)   Social Connection and Isolation Panel [NHANES]    Frequency of Communication with Friends and Family: Once a week    Frequency of Social Gatherings with Friends and Family: Once a week    Attends Religious Services: Never    Database administrator or Organizations: No    Attends Engineer, structural: Not on file    Marital Status: Married  Catering manager Violence: Not At Risk (03/03/2021)   Humiliation, Afraid, Rape, and Kick questionnaire    Fear of Current or Ex-Partner: No    Emotionally Abused: No    Physically Abused: No    Sexually Abused: No   Family History  Problem Relation Age of Onset   Hypertension Mother    Dementia Mother    Breast cancer Mother    Stomach cancer Mother    Ovarian cancer Mother    Heart disease Father      Objective: Office vital signs reviewed. BP 127/72   Pulse 100   Temp 98.3 F (36.8 C)   Ht  (1.905 m)   Wt (!) 370 lb (167.8 kg)   SpO2 95%   BMI 46.25 kg/m   Physical Examination:  General: Awake, alert, well nourished, No acute distress.  Nontoxic Skin: Draining lesion noted along the left lower face near the jawline.  This has a fairly large area of surrounding induration.  There is associated warmth, erythema.  Assessment/ Plan: 67 y.o. male   Infected cyst of  skin - Plan: cefTRIAXone (ROCEPHIN) injection 1 g, doxycycline (VIBRA-TABS) 100 MG tablet  Ceftriaxone administered.  Start doxycycline orally tomorrow.  He will arrange follow-up with Dr. Margo Aye for consideration for removal of underlying cyst.  We discussed red flag signs and symptoms warranting further evaluation in the ER.  He voiced good understanding will follow-up as needed  No orders of the defined types were placed in this encounter.  No orders of the defined types were placed in this encounter.    Raliegh Ip, DO [facility] [phone]

## 2023-01-25 NOTE — Patient Instructions (Signed)
Call Dr Scharlene Gloss office to talk about cyst removal. In the meantime, shot of antibiotics given today. Start pill tomorrow with breakfast.  Take this med WITH food. Otherwise, it will cause nausea.

## 2023-02-03 ENCOUNTER — Ambulatory Visit (INDEPENDENT_AMBULATORY_CARE_PROVIDER_SITE_OTHER): Payer: Medicare HMO

## 2023-02-03 DIAGNOSIS — E291 Testicular hypofunction: Secondary | ICD-10-CM

## 2023-02-07 ENCOUNTER — Encounter: Payer: Self-pay | Admitting: Family

## 2023-02-07 ENCOUNTER — Ambulatory Visit: Payer: Medicare HMO | Admitting: Family

## 2023-02-07 VITALS — BP 119/70 | HR 71 | Temp 97.5°F | Ht 75.0 in | Wt 373.0 lb

## 2023-02-07 DIAGNOSIS — L03116 Cellulitis of left lower limb: Secondary | ICD-10-CM | POA: Diagnosis not present

## 2023-02-07 MED ORDER — SULFAMETHOXAZOLE-TRIMETHOPRIM 800-160 MG PO TABS: 1.0000 | ORAL_TABLET | Freq: Two times a day (BID) | ORAL | 0 refills | Status: DC

## 2023-02-07 NOTE — Patient Instructions (Signed)

## 2023-02-07 NOTE — Progress Notes (Signed)
Subjective:    Patient ID: Richard Crawford, male    DOB: [birth year], 67 y.o.   MRN: [id number]  Chief Complaint  Patient presents with   Cellulitis   PT presents to the office today for cellulitis of left lower leg that started a week ago and has worsen. Reports aching, stinging pain 8 out 10. Denies any fever, or discharge.  He was given doxycyline for infected cyst on back and face. He still has a day and half left.  Rash This is a new problem. The current episode started 1 to 4 weeks ago. The problem has been gradually worsening since onset. The affected locations include the left lower leg. The rash is characterized by redness and burning. The treatment provided no relief.     Review of Systems  Skin:  Positive for rash.  All other systems reviewed and are negative.      Objective:   Physical Exam Vitals reviewed.  Constitutional:      General: He is not in acute distress.    Appearance: He is well-developed. He is obese.  HENT:     Head: Normocephalic.  Eyes:     General:        Right eye: No discharge.        Left eye: No discharge.     Pupils: Pupils are equal, round, and reactive to light.  Neck:     Thyroid: No thyromegaly.  Cardiovascular:     Rate and Rhythm: Normal rate and regular rhythm.     Heart sounds: Normal heart sounds. No murmur heard. Pulmonary:     Effort: Pulmonary effort is normal. No respiratory distress.     Breath sounds: Normal breath sounds. No wheezing.  Abdominal:     General: Bowel sounds are normal. There is no distension.     Palpations: Abdomen is soft.     Tenderness: There is no abdominal tenderness.  Musculoskeletal:        General: Tenderness present.     Cervical back: Normal range of motion and neck supple.  Skin:    General: Skin is warm and dry.     Findings: Erythema present. No rash.     Comments: Edema of LLE, redness, warmth, and tenderness    Neurological:     Mental Status: He is alert and oriented to person,  place, and time.     Cranial Nerves: No cranial nerve deficit.     Deep Tendon Reflexes: Reflexes are normal and symmetric.  Psychiatric:        Behavior: Behavior normal.        Thought Content: Thought content normal.        Judgment: Judgment normal.        BP 119/70   Pulse 71   Temp (!) 97.5 F (36.4 C) (Temporal)   Ht 6\' 3"  (1.905 m)   Wt (!) 373 lb (169.2 kg)   SpO2 97%   BMI 46.62 kg/m   Assessment & Plan:   Richard Crawford comes in today with chief complaint of Cellulitis   Diagnosis and orders addressed:  1. Cellulitis of left lower extremity Start Bactrim  Can stop doxycyline and start Bactrim  Leg wrapped  Keep elevated when at home  Area marked and let us know if redness spreads past line. Follow up if symptoms worsen or do not improve  Keep follow up with PCP on Wednesday - sulfamethoxazole-trimethoprim (BACTRIM DS) 800-160 MG tablet; Take 1 tablet by mouth 2 (two)  times daily.  Dispense: 14 tablet; Refill: 0  Jannifer Rodney, FNP

## 2023-02-08 ENCOUNTER — Encounter: Payer: Self-pay | Admitting: Family Medicine

## 2023-02-09 ENCOUNTER — Encounter: Payer: Self-pay | Admitting: Family Medicine

## 2023-02-09 ENCOUNTER — Ambulatory Visit: Payer: Medicare HMO | Admitting: Family Medicine

## 2023-02-09 VITALS — BP 118/68 | HR 85 | Temp 98.3°F | Ht 75.0 in | Wt 369.8 lb

## 2023-02-09 DIAGNOSIS — E291 Testicular hypofunction: Secondary | ICD-10-CM | POA: Diagnosis not present

## 2023-02-09 DIAGNOSIS — I5032 Chronic diastolic (congestive) heart failure: Secondary | ICD-10-CM

## 2023-02-09 DIAGNOSIS — E1169 Type 2 diabetes mellitus with other specified complication: Secondary | ICD-10-CM | POA: Diagnosis not present

## 2023-02-09 DIAGNOSIS — I4821 Permanent atrial fibrillation: Secondary | ICD-10-CM

## 2023-02-09 DIAGNOSIS — E785 Hyperlipidemia, unspecified: Secondary | ICD-10-CM

## 2023-02-09 DIAGNOSIS — N521 Erectile dysfunction due to diseases classified elsewhere: Secondary | ICD-10-CM

## 2023-02-09 DIAGNOSIS — R748 Abnormal levels of other serum enzymes: Secondary | ICD-10-CM | POA: Diagnosis not present

## 2023-02-09 DIAGNOSIS — E1159 Type 2 diabetes mellitus with other circulatory complications: Secondary | ICD-10-CM | POA: Diagnosis not present

## 2023-02-09 DIAGNOSIS — I152 Hypertension secondary to endocrine disorders: Secondary | ICD-10-CM

## 2023-02-09 LAB — BAYER DCA HB A1C WAIVED: HB A1C (BAYER DCA - WAIVED): 6.8 % — ABNORMAL HIGH (ref 4.8–5.6)

## 2023-02-09 MED ORDER — SILDENAFIL CITRATE 100 MG PO TABS
50.0000 mg | ORAL_TABLET | Freq: Every day | ORAL | 11 refills | Status: DC | PRN
Start: 2023-02-09 — End: 2023-04-19

## 2023-02-09 NOTE — Patient Instructions (Signed)

## 2023-02-09 NOTE — Progress Notes (Signed)
Subjective:  Patient ID: Richard Crawford, male    DOB: 06-Aug-1956, 67 y.o.   MRN: [id number]  Patient Care Team: Sonny Masters, FNP as PCP - General (Family Medicine) Rollene Rotunda, MD as PCP - Cardiology (Cardiology)   Chief Complaint:  Medical Management of Chronic Issues (3 month chronic follow up )   HPI: [name] is a 67 y.o. male presenting on 02/09/2023 for Medical Management of Chronic Issues (3 month chronic follow up )    1. Type 2 diabetes mellitus with other specified complication, without long-term current use of insulin (HCC) Compliant with regimen, denies adverse side effects. No polyuria, polyphagia, or polydipsia. Has been checking blood sugars, highest reading 130.   2. Hypertension associated with diabetes (HCC) Compliant with medications without associated side effects. Not on diuretic as he does not wish to take. No chest pain, shortness of breath, headaches, weakness, confusion, visual changes, or syncope. Does have leg swelling, not new or worsening.   3. Hyperlipidemia associated with type 2 diabetes mellitus (HCC) On statin therapy and tolerating well. No myalgias. No regular diet or exercise.   4. Testosterone deficiency in male On repletion therapy and feels he is doing well. Taking 175 mg weekly. No associated side effects.   5. Elevated liver enzymes Alk Phos has been elevated. No associated symptoms. Will reevaluate today with isoenzymes.   6. Chronic diastolic HF (heart failure) (HCC) 7. Permanent atrial fibrillation (HCC) Followed by cardiology. Currently not on diuretic, declines to take. No reported orthopnea or PND. Leg swelling present but not more than usual. No palpitations, is on rate control and Xarelto. Tolerating well. No abnormal bleeding or bruising.   8. Morbid obesity (HCC) Has lost weight on Rybelsus. Does not follow a strict diet. No exercise routine.   9. 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 Has not got this refilled recently. Has a hard time getting and sustaining an erection.      Relevant past medical, surgical, family, and social history reviewed and updated as indicated.  Allergies and medications reviewed and updated. Data reviewed: Chart in Epic.   Past Medical History:  Diagnosis Date   Anxiety    Arthritis    left shoulder   Atrial fibrillation (HCC)    Cancer (HCC) 1989   thyroid   Chronic heart failure with preserved ejection fraction (HCC)    Diabetes mellitus without complication (HCC)    Hemorrhoids    external   Hx of colonic polyps    Hyperlipidemia    Hypertension    Low testosterone 12/14/2020   Migraine    Papillary thyroid carcinoma (HCC)    Postoperative hypothyroidism    Primary osteoarthritis of left shoulder    Sleep apnea    Venous stasis dermatitis of both lower extremities     Past Surgical History:  Procedure Laterality Date   ANKLE FUSION     KNEE ARTHROSCOPY     SHOULDER ARTHROSCOPY     TOTAL SHOULDER REPLACEMENT Right     Social History   Socioeconomic History   Marital status: Married    Spouse name: Not on file   Number of children: Not on file   Years of education: Not on file   Highest education level: 12th grade  Occupational History   Occupation: retired  Tobacco Use   Smoking status: Never   Smokeless tobacco: Never  Substance and Sexual Activity   Alcohol use: Not  Currently   Drug use: Never   Sexual activity: Not on file  Other Topics Concern   Not on file  Social History Narrative   Lives at home with wife - Originally from [place]; moved here so wife could be closer to her grandchildren (second wife)   Social Determinants of Health   Financial Resource Strain: Low Risk  (01/24/2023)   Overall Financial Resource Strain (CARDIA)    Difficulty of Paying Living Expenses: Not very hard  Food Insecurity: No Food Insecurity (01/24/2023)   Hunger Vital Sign    Worried About  Running Out of Food in the Last Year: Never true    Ran Out of Food in the Last Year: Never true  Transportation Needs: No Transportation Needs (01/24/2023)   PRAPARE - Administrator, Civil Service (Medical): No    Lack of Transportation (Non-Medical): No  Physical Activity: Unknown (01/24/2023)   Exercise Vital Sign    Days of Exercise per Week: 0 days    Minutes of Exercise per Session: Not on file  Stress: No Stress Concern Present (01/24/2023)   [employer] of Occupational Health - Occupational Stress Questionnaire    Feeling of Stress : Only a little  Social Connections: Socially Isolated (01/24/2023)   Social Connection and Isolation Panel [NHANES]    Frequency of Communication with Friends and Family: Once a week    Frequency of Social Gatherings with Friends and Family: Once a week    Attends Religious Services: Never    Database administrator or Organizations: No    Attends Engineer, structural: Not on file    Marital Status: Married  Catering manager Violence: Not At Risk (03/03/2021)   Humiliation, Afraid, Rape, and Kick questionnaire    Fear of Current or Ex-Partner: No    Emotionally Abused: No    Physically Abused: No    Sexually Abused: No    Outpatient Encounter Medications as of 02/09/2023  Medication Sig   allopurinol (ZYLOPRIM) 100 MG tablet Take 1 tablet (100 mg total) by mouth daily.   atorvastatin (LIPITOR) 20 MG tablet Take 1 tablet (20 mg total) by mouth daily.   carbamide peroxide (DEBROX) 6.5 % OTIC solution Place 5 drops into both ears 2 (two) times daily.   dapagliflozin propanediol (FARXIGA) 5 MG TABS tablet Take 1 tablet (5 mg total) by mouth daily before breakfast.   DULoxetine (CYMBALTA) 30 MG capsule Take 1 capsule (30 mg total) by mouth daily.   gabapentin (NEURONTIN) 100 MG capsule Take 2 capsules (200 mg total) by mouth 3 (three) times daily.   glucose blood test strip Check BS daily and as needed Dx E11.9   levothyroxine  (SYNTHROID) 200 MCG tablet Take 1 tablet (200 mcg total) by mouth daily before breakfast.   levothyroxine (SYNTHROID) 50 MCG tablet TAKE 1 TABLET DAILY BEFORE BREAKFAST. (TAKE IN ADDITION TO TABLET FOR TOTAL DOSE 250 MCG)   metoprolol tartrate (LOPRESSOR) 100 MG tablet TAKE (2) TABLETS DAILY   Multiple Vitamin (MULTIVITAMIN) tablet Take 1 tablet by mouth 2 (two) times daily.   Prodigy Lancets 28G MISC Check BS daily and as needed Dx E11.9   Semaglutide (RYBELSUS) 7 MG TABS Take 1 tablet by mouth daily.   sulfamethoxazole-trimethoprim (BACTRIM DS) 800-160 MG tablet Take 1 tablet by mouth 2 (two) times daily.   SYRINGE-NEEDLE, DISP, 3 ML 20G X 1-1/2" 3 ML MISC 1 each by Does not apply route once a week.   testosterone cypionate (  DEPOTESTOSTERONE CYPIONATE) 200 MG/ML injection INJECT 0.75 ML (175 MG TOTAL) INTO THE MUSCLE EVERY 14 DAYS   valsartan (DIOVAN) 80 MG tablet Take 1 tablet (80 mg total) by mouth daily.   XARELTO 20 MG TABS tablet TAKE 1 TABLET (20 MG TOTAL) BY MOUTH DAILY WITH SUPPER FOR 30 DAYS.   [DISCONTINUED] sildenafil (VIAGRA) 100 MG tablet Take 0.5-1 tablets (50-100 mg total) by mouth daily as needed for erectile dysfunction.   sildenafil (VIAGRA) 100 MG tablet Take 0.5-1 tablets (50-100 mg total) by mouth daily as needed for erectile dysfunction.   Facility-Administered Encounter Medications as of 02/09/2023  Medication   testosterone cypionate (DEPOTESTOSTERONE CYPIONATE) injection 175 mg    Allergies  Allergen Reactions   Morphine And Related Nausea And Vomiting and Nausea Only    Review of Systems  Constitutional:  Positive for fatigue. Negative for activity change, appetite change, chills, diaphoresis, fever and unexpected weight change.  HENT: Negative.    Eyes: Negative.  Negative for photophobia and visual disturbance.  Respiratory:  Negative for cough, chest tightness and shortness of breath.   Cardiovascular:  Positive for leg swelling. Negative for chest  pain and palpitations.  Gastrointestinal:  Negative for abdominal pain, blood in stool, constipation, diarrhea, nausea and vomiting.  Endocrine: Negative.  Negative for polydipsia, polyphagia and polyuria.  Genitourinary:  Negative for dysuria, frequency and urgency.       ED - hard to get and sustain an erection  Musculoskeletal:  Negative for arthralgias and myalgias.  Skin:  Positive for color change.  Allergic/Immunologic: Negative.   Neurological:  Negative for dizziness, tremors, seizures, syncope, facial asymmetry, speech difficulty, weakness, light-headedness, numbness and headaches.  Hematological: Negative.   Psychiatric/Behavioral:  Negative for confusion, hallucinations, sleep disturbance and suicidal ideas.   All other systems reviewed and are negative.       Objective:  BP 118/68   Pulse 85   Temp 98.3 F (36.8 C) (Temporal)   Ht 6\' 3"  (1.905 m)   Wt (!) 369 lb 12.8 oz (167.7 kg)   SpO2 94%   BMI 46.22 kg/m    Wt Readings from Last 3 Encounters:  02/09/23 (!) 369 lb 12.8 oz (167.7 kg)  02/07/23 (!) 373 lb (169.2 kg)  01/25/23 (!) 370 lb (167.8 kg)    Physical Exam Vitals and nursing note reviewed.  Constitutional:      General: He is not in acute distress.    Appearance: Normal appearance. He is well-developed and well-groomed. He is morbidly obese. He is not ill-appearing, toxic-appearing or diaphoretic.  HENT:     Head: Normocephalic and atraumatic.     Jaw: There is normal jaw occlusion.     Right Ear: Hearing normal.     Left Ear: Hearing normal.     Nose: Nose normal.     Mouth/Throat:     Lips: Pink.     Mouth: Mucous membranes are moist.     Pharynx: Uvula midline.  Eyes:     General: Lids are normal.     Conjunctiva/sclera: Conjunctivae normal.     Pupils: Pupils are equal, round, and reactive to light.  Neck:     Thyroid: No thyroid mass, thyromegaly or thyroid tenderness.     Vascular: No carotid bruit or JVD.     Trachea: Trachea and  phonation normal.  Cardiovascular:     Rate and Rhythm: Normal rate. Rhythm irregularly irregular.     Chest Wall: PMI is not displaced.  Pulses: Normal pulses.     Heart sounds: Normal heart sounds. No murmur heard.    No friction rub. No gallop.  Pulmonary:     Effort: Pulmonary effort is normal. No respiratory distress.     Breath sounds: Normal breath sounds. No wheezing.  Abdominal:     General: There is no abdominal bruit.     Palpations: There is no hepatomegaly or splenomegaly.  Musculoskeletal:        General: Normal range of motion.     Cervical back: Normal range of motion and neck supple.     Right lower leg: 1+ Edema present.     Left lower leg: 1+ Edema present.  Lymphadenopathy:     Cervical: No cervical adenopathy.  Skin:    General: Skin is warm and dry.     Capillary Refill: Capillary refill takes less than 2 seconds.     Coloration: Skin is not cyanotic, jaundiced or pale.     Findings: Erythema present. No rash.       Neurological:     General: No focal deficit present.     Mental Status: He is alert and oriented to person, place, and time.     Sensory: Sensation is intact.     Motor: Motor function is intact.     Coordination: Coordination is intact.     Gait: Gait is intact.     Deep Tendon Reflexes: Reflexes are normal and symmetric.  Psychiatric:        Attention and Perception: Attention and perception normal.        Mood and Affect: Mood and affect normal.        Speech: Speech normal.        Behavior: Behavior normal. Behavior is cooperative.        Thought Content: Thought content normal.        Cognition and Memory: Cognition and memory normal.        Judgment: Judgment normal.     Results for orders placed or performed in visit on 02/09/23  Bayer DCA Hb A1c Waived  Result Value Ref Range   HB A1C (BAYER DCA - WAIVED) 6.8 (H) 4.8 - 5.6 %       Pertinent labs & imaging results that were available during my care of the patient were  reviewed by me and considered in my medical decision making.  Assessment & Plan:  Caelum was seen today for medical management of chronic issues.  Diagnoses and all orders for this visit:  Type 2 diabetes mellitus with other specified complication, without long-term current use of insulin (HCC) A1C 6.8, great job. Continue current regimen. Diet and exercise encouraged.  -     BMP8+EGFR -     Bayer DCA Hb A1c Waived -     Microalbumin / creatinine urine ratio -     Thyroid Panel With TSH -     T3, Free  Hypertension associated with diabetes (HCC) BP well controlled. Changes were not made in regimen today. Goal BP is 130/80. Pt aware to report any persistent high or low readings. DASH diet and exercise encouraged. Exercise at least 150 minutes per week and increase as tolerated. Goal BMI > 25. Stress management encouraged. Avoid nicotine and tobacco product use. Avoid excessive alcohol and NSAID's. Avoid more than 2000 mg of sodium daily. Medications as prescribed. Follow up as scheduled.  -     BMP8+EGFR  Hyperlipidemia associated with type 2 diabetes mellitus (HCC) Diet encouraged -  increase intake of fresh fruits and vegetables, increase intake of lean proteins. Bake, broil, or grill foods. Avoid fried, greasy, and fatty foods. Avoid fast foods. Increase intake of fiber-rich whole grains. Exercise encouraged - at least 150 minutes per week and advance as tolerated.  Goal BMI < 25. Continue medications as prescribed. Follow up in 3-6 months as discussed.  -     BMP8+EGFR  Testosterone deficiency in male Doing well on current dosing.   Elevated liver enzymes Will check with isoenzymes today.  -     Alkaline Phosphatase, Isoenzymes  Chronic diastolic HF (heart failure) (HCC) Permanent atrial fibrillation (HCC) Followed by cardiology on a regular basis. Currently not on diuretic, declines to take.   Morbid obesity (HCC) Diet and exercise encouraged. Labs pending.  -      BMP8+EGFR -     Bayer DCA Hb A1c Waived -     Thyroid Panel With TSH -     T3, Free -     sildenafil (VIAGRA) 100 MG tablet; Take 0.5-1 tablets (50-100 mg total) by mouth daily as needed for erectile dysfunction.  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 Offered referral, pt declined.  -     sildenafil (VIAGRA) 100 MG tablet; Take 0.5-1 tablets (50-100 mg total) by mouth daily as needed for erectile dysfunction.     Continue all other maintenance medications.  Follow up plan: Return in 3 months (on 05/12/2023) for DM schedule AWV.   Continue healthy lifestyle choices, including diet (rich in fruits, vegetables, and lean proteins, and low in salt and simple carbohydrates) and exercise (at least 30 minutes of moderate physical activity daily).  Educational handout given for DM  The above assessment and management plan was discussed with the patient. The patient verbalized understanding of and has agreed to the management plan. Patient is aware to call the clinic if they develop any new symptoms or if symptoms persist or worsen. Patient is aware when to return to the clinic for a follow-up visit. Patient educated on when it is appropriate to go to the emergency department.   Kari Baars, FNP-C [facility] [phone]

## 2023-02-10 LAB — THYROID PANEL WITH TSH: TSH: 0.999 u[IU]/mL (ref 0.450–4.500)

## 2023-02-15 ENCOUNTER — Other Ambulatory Visit: Payer: Self-pay | Admitting: Family Medicine

## 2023-02-15 ENCOUNTER — Ambulatory Visit: Payer: Medicare HMO | Admitting: Family Medicine

## 2023-02-15 DIAGNOSIS — L03116 Cellulitis of left lower limb: Secondary | ICD-10-CM

## 2023-02-15 DIAGNOSIS — E1169 Type 2 diabetes mellitus with other specified complication: Secondary | ICD-10-CM

## 2023-02-15 MED ORDER — SULFAMETHOXAZOLE-TRIMETHOPRIM 800-160 MG PO TABS: 1.0000 | ORAL_TABLET | Freq: Two times a day (BID) | ORAL | 0 refills | Status: DC

## 2023-02-16 LAB — THYROID PANEL WITH TSH
Free Thyroxine Index: 2.3 (ref 1.2–4.9)
T3 Uptake Ratio: 30 % (ref 24–39)
T4, Total: 7.6 ug/dL (ref 4.5–12.0)

## 2023-02-16 LAB — BMP8+EGFR
BUN/Creatinine Ratio: 12 (ref 10–24)
BUN: 15 mg/dL (ref 8–27)
CO2: 24 mmol/L (ref 20–29)
Calcium: 9.4 mg/dL (ref 8.6–10.2)
Chloride: 102 mmol/L (ref 96–106)
Creatinine, Ser: 1.23 mg/dL (ref 0.76–1.27)
Glucose: 210 mg/dL — ABNORMAL HIGH (ref 70–99)
Potassium: 4.5 mmol/L (ref 3.5–5.2)
Sodium: 142 mmol/L (ref 134–144)
eGFR: 64 mL/min/{1.73_m2} (ref >59)

## 2023-02-16 LAB — T3, FREE: T3, Free: 2.7 pg/mL (ref 2.0–4.4)

## 2023-02-16 LAB — ALKALINE PHOSPHATASE, ISOENZYMES
Alkaline Phosphatase: 166 IU/L — ABNORMAL HIGH (ref 44–121)
BONE FRACTION: 28 % (ref 12–68)
INTESTINAL FRAC.: 0 % (ref 0–18)
LIVER FRACTION: 72 % (ref 13–88)

## 2023-02-17 ENCOUNTER — Ambulatory Visit (INDEPENDENT_AMBULATORY_CARE_PROVIDER_SITE_OTHER): Payer: Medicare HMO

## 2023-02-17 DIAGNOSIS — E291 Testicular hypofunction: Secondary | ICD-10-CM | POA: Diagnosis not present

## 2023-02-17 NOTE — Progress Notes (Signed)
Testosterone injection given to right thigh.  Patient tolerated well.

## 2023-03-03 ENCOUNTER — Ambulatory Visit (INDEPENDENT_AMBULATORY_CARE_PROVIDER_SITE_OTHER): Payer: Medicare HMO

## 2023-03-03 DIAGNOSIS — E291 Testicular hypofunction: Secondary | ICD-10-CM

## 2023-03-09 ENCOUNTER — Other Ambulatory Visit: Payer: Self-pay | Admitting: Family Medicine

## 2023-03-09 DIAGNOSIS — Z85850 Personal history of malignant neoplasm of thyroid: Secondary | ICD-10-CM

## 2023-03-11 ENCOUNTER — Encounter: Payer: Self-pay | Admitting: *Deleted

## 2023-03-17 ENCOUNTER — Ambulatory Visit (INDEPENDENT_AMBULATORY_CARE_PROVIDER_SITE_OTHER): Payer: Medicare HMO

## 2023-03-17 ENCOUNTER — Other Ambulatory Visit: Payer: Self-pay | Admitting: Family Medicine

## 2023-03-17 DIAGNOSIS — E291 Testicular hypofunction: Secondary | ICD-10-CM

## 2023-03-17 MED ORDER — TESTOSTERONE CYPIONATE 200 MG/ML IM SOLN
INTRAMUSCULAR | 0 refills | Status: DC
Start: 2023-03-17 — End: 2023-03-22

## 2023-03-20 ENCOUNTER — Encounter: Payer: Self-pay | Admitting: Family Medicine

## 2023-03-21 NOTE — Progress Notes (Signed)
Testosterone given and patient tolerated well.  

## 2023-03-22 ENCOUNTER — Other Ambulatory Visit: Payer: Self-pay | Admitting: Family Medicine

## 2023-03-22 DIAGNOSIS — E291 Testicular hypofunction: Secondary | ICD-10-CM

## 2023-03-22 MED ORDER — TESTOSTERONE CYPIONATE 200 MG/ML IM SOLN
INTRAMUSCULAR | 0 refills | Status: DC
Start: 2023-03-22 — End: 2023-04-01

## 2023-03-31 ENCOUNTER — Ambulatory Visit: Payer: Medicare HMO

## 2023-03-31 ENCOUNTER — Other Ambulatory Visit: Payer: Self-pay | Admitting: Family Medicine

## 2023-03-31 DIAGNOSIS — I4821 Permanent atrial fibrillation: Secondary | ICD-10-CM

## 2023-04-01 ENCOUNTER — Other Ambulatory Visit: Payer: Self-pay | Admitting: Nurse Practitioner

## 2023-04-01 DIAGNOSIS — E291 Testicular hypofunction: Secondary | ICD-10-CM

## 2023-04-01 MED ORDER — TESTOSTERONE CYPIONATE 200 MG/ML IM SOLN
INTRAMUSCULAR | 0 refills | Status: DC
Start: 2023-04-01 — End: 2023-07-28

## 2023-04-01 NOTE — Progress Notes (Signed)
Meds had to be sent to a different pharmacy  Meds ordered this encounter  Medications   testosterone cypionate (DEPOTESTOSTERONE CYPIONATE) 200 MG/ML injection    Sig: 150 MG TOTAL INTO THE MUSCLE EVERY 14 DAYS    Dispense:  6 mL    Refill:  0    This request is for a new prescription for a controlled substance as required by Federal/State law.    Order Specific Question:   Supervising Provider    Answer:   Nils Pyle [[id number]]   Mary-Margaret Daphine Deutscher, FNP

## 2023-04-06 ENCOUNTER — Other Ambulatory Visit: Payer: Self-pay | Admitting: Family Medicine

## 2023-04-06 DIAGNOSIS — E1169 Type 2 diabetes mellitus with other specified complication: Secondary | ICD-10-CM

## 2023-04-15 ENCOUNTER — Encounter: Payer: Self-pay | Admitting: Family Medicine

## 2023-04-15 ENCOUNTER — Ambulatory Visit: Payer: Medicare HMO

## 2023-04-18 NOTE — Telephone Encounter (Signed)
Can we fax his meds so he can get them via mail order?

## 2023-04-19 ENCOUNTER — Other Ambulatory Visit: Payer: Self-pay

## 2023-04-19 DIAGNOSIS — Z85850 Personal history of malignant neoplasm of thyroid: Secondary | ICD-10-CM

## 2023-04-19 DIAGNOSIS — F411 Generalized anxiety disorder: Secondary | ICD-10-CM

## 2023-04-19 DIAGNOSIS — M10041 Idiopathic gout, right hand: Secondary | ICD-10-CM

## 2023-04-19 DIAGNOSIS — F4321 Adjustment disorder with depressed mood: Secondary | ICD-10-CM

## 2023-04-19 DIAGNOSIS — E1141 Type 2 diabetes mellitus with diabetic mononeuropathy: Secondary | ICD-10-CM

## 2023-04-19 DIAGNOSIS — I5032 Chronic diastolic (congestive) heart failure: Secondary | ICD-10-CM

## 2023-04-19 DIAGNOSIS — E1169 Type 2 diabetes mellitus with other specified complication: Secondary | ICD-10-CM

## 2023-04-19 DIAGNOSIS — E1159 Type 2 diabetes mellitus with other circulatory complications: Secondary | ICD-10-CM

## 2023-04-19 DIAGNOSIS — I4821 Permanent atrial fibrillation: Secondary | ICD-10-CM

## 2023-04-19 DIAGNOSIS — G8929 Other chronic pain: Secondary | ICD-10-CM

## 2023-04-19 DIAGNOSIS — F4381 Prolonged grief disorder: Secondary | ICD-10-CM

## 2023-04-19 MED ORDER — METOPROLOL TARTRATE 100 MG PO TABS
ORAL_TABLET | ORAL | 1 refills | Status: DC
Start: 2023-04-19 — End: 2023-12-14

## 2023-04-19 MED ORDER — LEVOTHYROXINE SODIUM 200 MCG PO TABS
ORAL_TABLET | ORAL | 1 refills | Status: DC
Start: 2023-04-19 — End: 2023-12-14

## 2023-04-19 MED ORDER — DULOXETINE HCL 30 MG PO CPEP
30.0000 mg | ORAL_CAPSULE | Freq: Every day | ORAL | 1 refills | Status: DC
Start: 2023-04-19 — End: 2023-05-24

## 2023-04-19 MED ORDER — DAPAGLIFLOZIN PROPANEDIOL 5 MG PO TABS
ORAL_TABLET | ORAL | 1 refills | Status: DC
Start: 2023-04-19 — End: 2023-05-24

## 2023-04-19 MED ORDER — RYBELSUS 7 MG PO TABS
1.00 | ORAL_TABLET | Freq: Every day | ORAL | 0 refills | Status: AC
Start: 2023-04-19 — End: ?

## 2023-04-19 MED ORDER — ATORVASTATIN CALCIUM 20 MG PO TABS
20.0000 mg | ORAL_TABLET | Freq: Every day | ORAL | 1 refills | Status: DC
Start: 2023-04-19 — End: 2023-10-07

## 2023-04-19 MED ORDER — SILDENAFIL CITRATE 100 MG PO TABS
50.0000 mg | ORAL_TABLET | Freq: Every day | ORAL | 11 refills | Status: DC | PRN
Start: 2023-04-19 — End: 2024-07-10

## 2023-04-19 MED ORDER — RIVAROXABAN 20 MG PO TABS
ORAL_TABLET | ORAL | 0 refills | Status: DC
Start: 2023-04-19 — End: 2023-06-15

## 2023-04-19 MED ORDER — GABAPENTIN 100 MG PO CAPS
200.0000 mg | ORAL_CAPSULE | Freq: Three times a day (TID) | ORAL | 1 refills | Status: DC
Start: 2023-04-19 — End: 2023-08-23

## 2023-04-19 MED ORDER — ALLOPURINOL 100 MG PO TABS
100.0000 mg | ORAL_TABLET | Freq: Every day | ORAL | 6 refills | Status: DC
Start: 2023-04-19 — End: 2023-10-07

## 2023-04-19 MED ORDER — VALSARTAN 80 MG PO TABS
80.0000 mg | ORAL_TABLET | Freq: Every day | ORAL | 1 refills | Status: DC
Start: 2023-04-19 — End: 2023-12-14

## 2023-04-19 MED ORDER — LEVOTHYROXINE SODIUM 50 MCG PO TABS
ORAL_TABLET | ORAL | 1 refills | Status: DC
Start: 2023-04-19 — End: 2023-10-21

## 2023-04-28 ENCOUNTER — Ambulatory Visit (INDEPENDENT_AMBULATORY_CARE_PROVIDER_SITE_OTHER): Payer: Medicare HMO

## 2023-04-28 DIAGNOSIS — E291 Testicular hypofunction: Secondary | ICD-10-CM | POA: Diagnosis not present

## 2023-04-28 NOTE — Progress Notes (Signed)
Testosterone injection given to patient and tolerated well.

## 2023-04-29 ENCOUNTER — Ambulatory Visit: Payer: Medicare HMO

## 2023-05-12 ENCOUNTER — Ambulatory Visit: Payer: Medicare HMO | Admitting: Family Medicine

## 2023-05-12 ENCOUNTER — Ambulatory Visit (INDEPENDENT_AMBULATORY_CARE_PROVIDER_SITE_OTHER): Payer: Medicare HMO

## 2023-05-12 DIAGNOSIS — E291 Testicular hypofunction: Secondary | ICD-10-CM | POA: Diagnosis not present

## 2023-05-13 ENCOUNTER — Ambulatory Visit: Payer: Medicare HMO

## 2023-05-24 ENCOUNTER — Ambulatory Visit (INDEPENDENT_AMBULATORY_CARE_PROVIDER_SITE_OTHER): Payer: Medicare HMO

## 2023-05-24 ENCOUNTER — Encounter: Payer: Self-pay | Admitting: Family Medicine

## 2023-05-24 ENCOUNTER — Ambulatory Visit: Payer: Medicare HMO | Admitting: Family Medicine

## 2023-05-24 VITALS — BP 134/77 | HR 85 | Temp 97.7°F | Resp 20 | Ht 75.0 in | Wt 371.5 lb

## 2023-05-24 DIAGNOSIS — G8929 Other chronic pain: Secondary | ICD-10-CM

## 2023-05-24 DIAGNOSIS — E1169 Type 2 diabetes mellitus with other specified complication: Secondary | ICD-10-CM

## 2023-05-24 DIAGNOSIS — E890 Postprocedural hypothyroidism: Secondary | ICD-10-CM

## 2023-05-24 DIAGNOSIS — I152 Hypertension secondary to endocrine disorders: Secondary | ICD-10-CM

## 2023-05-24 DIAGNOSIS — E1159 Type 2 diabetes mellitus with other circulatory complications: Secondary | ICD-10-CM

## 2023-05-24 DIAGNOSIS — F411 Generalized anxiety disorder: Secondary | ICD-10-CM

## 2023-05-24 DIAGNOSIS — M5442 Lumbago with sciatica, left side: Secondary | ICD-10-CM | POA: Diagnosis not present

## 2023-05-24 DIAGNOSIS — M25572 Pain in left ankle and joints of left foot: Secondary | ICD-10-CM

## 2023-05-24 DIAGNOSIS — E785 Hyperlipidemia, unspecified: Secondary | ICD-10-CM

## 2023-05-24 DIAGNOSIS — E1141 Type 2 diabetes mellitus with diabetic mononeuropathy: Secondary | ICD-10-CM | POA: Diagnosis not present

## 2023-05-24 DIAGNOSIS — M5441 Lumbago with sciatica, right side: Secondary | ICD-10-CM

## 2023-05-24 LAB — BAYER DCA HB A1C WAIVED: HB A1C (BAYER DCA - WAIVED): 7.2 % — ABNORMAL HIGH (ref 4.8–5.6)

## 2023-05-24 MED ORDER — DAPAGLIFLOZIN PROPANEDIOL 10 MG PO TABS
10.0000 mg | ORAL_TABLET | Freq: Every day | ORAL | 1 refills | Status: DC
Start: 2023-05-24 — End: 2023-06-07

## 2023-05-24 MED ORDER — DULOXETINE HCL 30 MG PO CPEP
60.0000 mg | ORAL_CAPSULE | Freq: Every day | ORAL | 2 refills | Status: DC
Start: 2023-05-24 — End: 2023-12-14

## 2023-05-24 NOTE — Progress Notes (Signed)
Subjective:  Patient ID: Richard Crawford, male    DOB: Jan 19, 1956, 67 y.o.   MRN: [id number]  Patient Care Team: Sonny Masters, FNP as PCP - General (Family Medicine) Rollene Rotunda, MD as PCP - Cardiology (Cardiology)   Chief Complaint:  Medical Management of Chronic Issues   HPI: Richard Crawford is a 67 y.o. male presenting on 05/24/2023 for Medical Management of Chronic Issues   1. Type 2 diabetes mellitus with other specified complication, without long-term current use of insulin (HCC) He is complaint with his medications but reports blood sugars have been slightly higher than normal. No polyuria, polyphagia, or polydipsia.   2. Hypertension associated with diabetes (HCC) Complaint with medications. Denies chest pain, palpitations, headaches, visual changes, increased leg swelling, weakness, or confusion. Blood pressure well controlled.  3. Hyperlipidemia associated with type 2 diabetes mellitus (HCC) On statin therapy and tolerating well. Does not follow a specific diet. No regular exercise due to chronic pain.   4. 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 States has increased slightly over the last several days. No injuries.   5. Postoperative hypothyroidism Feels his synthroid dosing needs to be increased despite levels being normal. He feels fatigue and worn down all of the time. He does not take his synthroid on an empty stomach daily.   6. Generalized anxiety disorder Doing ok on current medications. Feels the duloxetine dosing could be increased as he continues to have significant pain.     05/24/2023    3:37 PM 02/09/2023    3:01 PM 02/07/2023   10:26 AM 01/25/2023    1:55 PM 09/06/2022    2:02 PM  Depression screen PHQ 2/9  Decreased Interest 0 0 0 0 0  Down, Depressed, Hopeless 0 0 0 0 0  PHQ - 2 Score 0 0 0 0 0  Altered sleeping 1 0  0 0  Tired, decreased energy 1 1  0 0   Change in appetite 0 0  0 0  Feeling bad or failure about yourself  0 0  0 0  Trouble concentrating 0 0  0 0  Moving slowly or fidgety/restless 0 0  0 0  Suicidal thoughts 0 0  0 0  PHQ-9 Score 2 1  0 0  Difficult doing work/chores Somewhat difficult Not difficult at all  Not difficult at all Not difficult at all      05/24/2023    3:37 PM 02/09/2023    3:01 PM 01/25/2023    1:55 PM 09/06/2022    2:02 PM  GAD 7 : Generalized Anxiety Score  Nervous, Anxious, on Edge 0 0 0 0  Control/stop worrying 0 0 0 0  Worry too much - different things 0 0 0 0  Trouble relaxing 0 0 0 0  Restless 0 0 0 0  Easily annoyed or irritable 0 0 0 0  Afraid - awful might happen 0 0 0 0  Total GAD 7 Score 0 0 0 0  Anxiety Difficulty  Not difficult at all Not difficult at all Not difficult at all   7. Chronic bilateral low back pain with bilateral sciatica 8. Chronic pain of left ankle Followed by ortho for back but continues to have pain. States his left ankle has been swelling a bit more lately and is hurting more. Would like imaging to make sure hardware is in place and nothing has changed.  Relevant past medical, surgical, family, and social history reviewed and updated as indicated.  Allergies and medications reviewed and updated. Data reviewed: Chart in Epic.   Past Medical History:  Diagnosis Date   Anxiety    Arthritis    left shoulder   Atrial fibrillation (HCC)    Cancer (HCC) 1989   thyroid   Chronic heart failure with preserved ejection fraction (HCC)    Diabetes mellitus without complication (HCC)    Hemorrhoids    external   Hx of colonic polyps    Hyperlipidemia    Hypertension    Low testosterone 12/14/2020   Migraine    Papillary thyroid carcinoma (HCC)    Postoperative hypothyroidism    Primary osteoarthritis of left shoulder    Sleep apnea    Venous stasis dermatitis of both lower extremities     Past Surgical History:  Procedure Laterality Date   ANKLE FUSION      KNEE ARTHROSCOPY     SHOULDER ARTHROSCOPY     TOTAL SHOULDER REPLACEMENT Right     Social History   Socioeconomic History   Marital status: Married    Spouse name: Not on file   Number of children: Not on file   Years of education: Not on file   Highest education level: 12th grade  Occupational History   Occupation: retired  Tobacco Use   Smoking status: Never   Smokeless tobacco: Never  Substance and Sexual Activity   Alcohol use: Not Currently   Drug use: Never   Sexual activity: Not on file  Other Topics Concern   Not on file  Social History Narrative   Lives at home with wife - Originally from [place]; moved here so wife could be closer to her grandchildren (second wife)   Social Determinants of Health   Financial Resource Strain: Low Risk  (01/24/2023)   Overall Financial Resource Strain (CARDIA)    Difficulty of Paying Living Expenses: Not very hard  Food Insecurity: No Food Insecurity (01/24/2023)   Hunger Vital Sign    Worried About Running Out of Food in the Last Year: Never true    Ran Out of Food in the Last Year: Never true  Transportation Needs: No Transportation Needs (01/24/2023)   PRAPARE - Administrator, Civil Service (Medical): No    Lack of Transportation (Non-Medical): No  Physical Activity: Unknown (01/24/2023)   Exercise Vital Sign    Days of Exercise per Week: 0 days    Minutes of Exercise per Session: Not on file  Stress: No Stress Concern Present (01/24/2023)   [employer] of Occupational Health - Occupational Stress Questionnaire    Feeling of Stress : Only a little  Social Connections: Socially Isolated (01/24/2023)   Social Connection and Isolation Panel [NHANES]    Frequency of Communication with Friends and Family: Once a week    Frequency of Social Gatherings with Friends and Family: Once a week    Attends Religious Services: Never    Database administrator or Organizations: No    Attends Hospital doctor: Not on file    Marital Status: Married  Intimate Partner Violence: Unknown (01/14/2022)   Received from [facility]   HITS    Physically Hurt: Not on file    Insult or Talk Down To: Not on file    Threaten Physical Harm: Not on file    Scream or Curse: Not on file    Outpatient Encounter Medications as of  05/24/2023  Medication Sig   allopurinol (ZYLOPRIM) 100 MG tablet Take 1 tablet (100 mg total) by mouth daily.   atorvastatin (LIPITOR) 20 MG tablet Take 1 tablet (20 mg total) by mouth daily.   carbamide peroxide (DEBROX) 6.5 % OTIC solution Place 5 drops into both ears 2 (two) times daily.   dapagliflozin propanediol (FARXIGA) 10 MG TABS tablet Take 1 tablet (10 mg total) by mouth daily before breakfast.   gabapentin (NEURONTIN) 100 MG capsule Take 2 capsules (200 mg total) by mouth 3 (three) times daily.   glucose blood test strip Check BS daily and as needed Dx E11.9   levothyroxine (SYNTHROID) 200 MCG tablet TAKE ONE TABLET DAILY BEFORE BREAKFAST   levothyroxine (SYNTHROID) 50 MCG tablet TAKE 1 TABLET DAILY BEFORE BREAKFAST. (TAKE IN ADDITION TO TABLET FOR TOTAL DOSE 250 MCG)   metoprolol tartrate (LOPRESSOR) 100 MG tablet TAKE (2) TABLETS DAILY   Multiple Vitamin (MULTIVITAMIN) tablet Take 1 tablet by mouth 2 (two) times daily.   Prodigy Lancets 28G MISC Check BS daily and as needed Dx E11.9   rivaroxaban (XARELTO) 20 MG TABS tablet TAKE 1 TABLET (20 MG TOTAL) BY MOUTH DAILY WITH SUPPER FOR 30 DAYS.   Semaglutide (RYBELSUS) 7 MG TABS Take 1 tablet (7 mg total) by mouth daily.   sildenafil (VIAGRA) 100 MG tablet Take 0.5-1 tablets (50-100 mg total) by mouth daily as needed for erectile dysfunction.   SYRINGE-NEEDLE, DISP, 3 ML 20G X 1-1/2" 3 ML MISC 1 each by Does not apply route once a week.   testosterone cypionate (DEPOTESTOSTERONE CYPIONATE) 200 MG/ML injection 150 MG TOTAL INTO THE MUSCLE EVERY 14 DAYS   valsartan (DIOVAN) 80 MG tablet Take 1 tablet (80 mg  total) by mouth daily.   [DISCONTINUED] dapagliflozin propanediol (FARXIGA) 5 MG TABS tablet TAKE ONE TABLET DAILY BEFORE BREAKFAST   [DISCONTINUED] DULoxetine (CYMBALTA) 30 MG capsule Take 1 capsule (30 mg total) by mouth daily.   DULoxetine (CYMBALTA) 30 MG capsule Take 2 capsules (60 mg total) by mouth daily.   [DISCONTINUED] sulfamethoxazole-trimethoprim (BACTRIM DS) 800-160 MG tablet Take 1 tablet by mouth 2 (two) times daily.   Facility-Administered Encounter Medications as of 05/24/2023  Medication   testosterone cypionate (DEPOTESTOSTERONE CYPIONATE) injection 175 mg    Allergies  Allergen Reactions   Morphine And Codeine Nausea And Vomiting and Nausea Only    Review of Systems  Constitutional:  Positive for activity change and fatigue. Negative for appetite change, chills, diaphoresis, fever and unexpected weight change.  HENT: Negative.    Eyes: Negative.  Negative for photophobia and visual disturbance.  Respiratory:  Negative for cough, chest tightness and shortness of breath.   Cardiovascular:  Positive for leg swelling. Negative for chest pain and palpitations.  Gastrointestinal:  Negative for abdominal pain, blood in stool, constipation, diarrhea, nausea and vomiting.  Endocrine: Negative.  Negative for cold intolerance, heat intolerance, polydipsia, polyphagia and polyuria.  Genitourinary:  Negative for decreased urine volume, difficulty urinating, dysuria, frequency and urgency.  Musculoskeletal:  Positive for arthralgias, back pain, joint swelling, myalgias, neck pain and neck stiffness.  Skin: Negative.   Allergic/Immunologic: Negative.   Neurological:  Negative for dizziness, tremors, seizures, syncope, facial asymmetry, speech difficulty, weakness, light-headedness, numbness and headaches.  Hematological: Negative.   Psychiatric/Behavioral:  Positive for sleep disturbance. Negative for behavioral problems, confusion, decreased concentration, dysphoric mood,  hallucinations, self-injury and suicidal ideas. The patient is not nervous/anxious and is not hyperactive.   All other systems reviewed and  are negative.       Objective:  BP 134/77   Pulse 85   Temp 97.7 F (36.5 C) (Oral)   Resp 20   Ht 6\' 3"  (1.905 m)   Wt (!) 371 lb 8 oz (168.5 kg)   SpO2 95%   BMI 46.43 kg/m    Wt Readings from Last 3 Encounters:  05/24/23 (!) 371 lb 8 oz (168.5 kg)  02/09/23 (!) 369 lb 12.8 oz (167.7 kg)  02/07/23 (!) 373 lb (169.2 kg)    Physical Exam Vitals and nursing note reviewed.  Constitutional:      General: He is not in acute distress.    Appearance: Normal appearance. He is well-developed and well-groomed. He is morbidly obese. He is not ill-appearing, toxic-appearing or diaphoretic.  HENT:     Head: Normocephalic and atraumatic.     Jaw: There is normal jaw occlusion.     Right Ear: Hearing normal.     Left Ear: Hearing normal.     Nose: Nose normal.     Mouth/Throat:     Lips: Pink.     Mouth: Mucous membranes are moist.     Pharynx: Oropharynx is clear. Uvula midline.  Eyes:     General: Lids are normal.     Extraocular Movements: Extraocular movements intact.     Conjunctiva/sclera: Conjunctivae normal.     Pupils: Pupils are equal, round, and reactive to light.  Neck:     Thyroid: No thyroid mass, thyromegaly or thyroid tenderness.     Vascular: No carotid bruit or JVD.     Trachea: Trachea and phonation normal.  Cardiovascular:     Rate and Rhythm: Normal rate. Rhythm irregularly irregular.     Chest Wall: PMI is not displaced.     Pulses: Normal pulses.     Heart sounds: Normal heart sounds. No murmur heard.    No friction rub. No gallop.  Pulmonary:     Effort: Pulmonary effort is normal. No respiratory distress.     Breath sounds: Normal breath sounds. No wheezing.  Abdominal:     General: Bowel sounds are normal. There is no distension or abdominal bruit.     Palpations: Abdomen is soft. There is no hepatomegaly or  splenomegaly.     Tenderness: There is no abdominal tenderness. There is no right CVA tenderness or left CVA tenderness.     Hernia: No hernia is present.  Musculoskeletal:     Cervical back: Normal range of motion and neck supple.     Right lower leg: Edema present.     Left lower leg: Edema present.     Right ankle: Swelling present. Decreased range of motion.     Left ankle: Swelling present. Tenderness present. Decreased range of motion.  Lymphadenopathy:     Cervical: No cervical adenopathy.  Skin:    General: Skin is warm and dry.     Capillary Refill: Capillary refill takes less than 2 seconds.     Coloration: Skin is not cyanotic, jaundiced or pale.     Findings: No rash.  Neurological:     General: No focal deficit present.     Mental Status: He is alert and oriented to person, place, and time.     Sensory: Sensation is intact.     Motor: Motor function is intact.     Coordination: Coordination is intact.     Gait: Gait is intact.     Deep Tendon Reflexes: Reflexes are normal and  symmetric.  Psychiatric:        Attention and Perception: Attention and perception normal.        Mood and Affect: Mood and affect normal.        Speech: Speech normal.        Behavior: Behavior normal. Behavior is cooperative.        Thought Content: Thought content normal.        Cognition and Memory: Cognition and memory normal.        Judgment: Judgment normal.     Results for orders placed or performed in visit on 05/24/23  Bayer DCA Hb A1c Waived  Result Value Ref Range   HB A1C (BAYER DCA - WAIVED) 7.2 (H) 4.8 - 5.6 %     X-Ray: left ankle: No changes from previous imaging, no acute findings. Preliminary x-ray reading by Kari Baars, FNP-C, [facility].   Pertinent labs & imaging results that were available during my care of the patient were reviewed by me and considered in my medical decision making.  Assessment & Plan:  Nimit Melnikov" was seen today for medical management of  chronic issues.  Diagnoses and all orders for this visit:  Type 2 diabetes mellitus with other specified complication, without long-term current use of insulin (HCC) A1C 7.2, increase Farxiga to 10 mg daily. All other labs pending. Diet and exercise encouraged.  -     Bayer DCA Hb A1c Waived; Future -     Bayer DCA Hb A1c Waived -     Thyroid Panel With TSH -     CMP14+EGFR -     CBC with Differential/Platelet -     Lipid panel -     dapagliflozin propanediol (FARXIGA) 10 MG TABS tablet; Take 1 tablet (10 mg total) by mouth daily before breakfast.  Hypertension associated with diabetes (HCC) BP well controlled. Changes were not made in regimen today. Goal BP is 130/80. Pt aware to report any persistent high or low readings. DASH diet and exercise encouraged. Exercise at least 150 minutes per week and increase as tolerated. Goal BMI > 25. Stress management encouraged. Avoid nicotine and tobacco product use. Avoid excessive alcohol and NSAID's. Avoid more than 2000 mg of sodium daily. Medications as prescribed. Follow up as scheduled.  -     Thyroid Panel With TSH -     CMP14+EGFR -     CBC with Differential/Platelet  Hyperlipidemia associated with type 2 diabetes mellitus (HCC) Diet encouraged - increase intake of fresh fruits and vegetables, increase intake of lean proteins. Bake, broil, or grill foods. Avoid fried, greasy, and fatty foods. Avoid fast foods. Increase intake of fiber-rich whole grains. Exercise encouraged - at least 150 minutes per week and advance as tolerated.  Goal BMI < 25. Continue medications as prescribed. Follow up in 3-6 months as discussed.  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  -     Bayer DCA Hb A1c Waived  Postoperative hypothyroidism Thyroid disease has been  well controlled. Labs are pending. Adjustments to regimen will be made if warranted. Make sure to take medications on an empty stomach with a full glass of water. Make sure to avoid vitamins or supplements for at least 4 hours before  and 4 hours after taking medications. Repeat labs in 3 months if adjustments are made and in 6 months if stable.   -     Thyroid Panel With TSH  Generalized anxiety disorder No new or worsening symptoms but does have increased arthralgias, will increase to 60 mg daily.  -     DULoxetine (CYMBALTA) 30 MG capsule; Take 2 capsules (60 mg total) by mouth daily.  Chronic bilateral low back pain with bilateral sciatica Chronic pain of left ankle Ankle imaging without significant changes from previous films. Radiology to read. Due to increased arthralgias, will increase Cymbalta to 60 mg daily.  -     DULoxetine (CYMBALTA) 30 MG capsule; Take 2 capsules (60 mg total) by mouth daily. -     DG Ankle Complete Left     Continue all other maintenance medications.  Follow up plan: Return in about 3 months (around 08/24/2023) for DM.   Continue healthy lifestyle choices, including diet (rich in fruits, vegetables, and lean proteins, and low in salt and simple carbohydrates) and exercise (at least 30 minutes of moderate physical activity daily).  Educational handout given for DM  The above assessment and management plan was discussed with the patient. The patient verbalized understanding of and has agreed to the management plan. Patient is aware to call the clinic if they develop any new symptoms or if symptoms persist or worsen. Patient is aware when to return to the clinic for a follow-up visit. Patient educated on when it is appropriate to go to the emergency department.   Kari Baars, FNP-C [facility] [phone]

## 2023-05-24 NOTE — Patient Instructions (Addendum)

## 2023-05-26 ENCOUNTER — Ambulatory Visit (INDEPENDENT_AMBULATORY_CARE_PROVIDER_SITE_OTHER): Payer: Medicare HMO

## 2023-05-26 DIAGNOSIS — E291 Testicular hypofunction: Secondary | ICD-10-CM

## 2023-05-26 LAB — VITAMIN B12: Vitamin B-12: 1132 pg/mL (ref 232–1245)

## 2023-05-26 LAB — SPECIMEN STATUS REPORT

## 2023-05-26 NOTE — Progress Notes (Signed)
Testosterone injection to left thigh per patient request.  Patient tolerated well.

## 2023-05-27 ENCOUNTER — Ambulatory Visit: Payer: Medicare HMO

## 2023-05-29 ENCOUNTER — Other Ambulatory Visit: Payer: Self-pay | Admitting: Family Medicine

## 2023-05-29 DIAGNOSIS — E1141 Type 2 diabetes mellitus with diabetic mononeuropathy: Secondary | ICD-10-CM

## 2023-05-29 DIAGNOSIS — G8929 Other chronic pain: Secondary | ICD-10-CM

## 2023-06-03 ENCOUNTER — Other Ambulatory Visit (HOSPITAL_COMMUNITY): Payer: Self-pay

## 2023-06-03 ENCOUNTER — Encounter: Payer: Self-pay | Admitting: Family Medicine

## 2023-06-07 ENCOUNTER — Encounter: Payer: Self-pay | Admitting: Family Medicine

## 2023-06-07 MED ORDER — DAPAGLIFLOZIN PROPANEDIOL 10 MG PO TABS: 10.0000 mg | ORAL_TABLET | Freq: Every day | ORAL | 1 refills | Status: DC

## 2023-06-07 NOTE — Addendum Note (Signed)
Addended by: Sonny Masters on: 06/07/2023 12:36 PM   Modules accepted: Orders

## 2023-06-09 ENCOUNTER — Ambulatory Visit (INDEPENDENT_AMBULATORY_CARE_PROVIDER_SITE_OTHER): Payer: Medicare HMO

## 2023-06-09 DIAGNOSIS — E291 Testicular hypofunction: Secondary | ICD-10-CM | POA: Diagnosis not present

## 2023-06-09 DIAGNOSIS — E1169 Type 2 diabetes mellitus with other specified complication: Secondary | ICD-10-CM

## 2023-06-09 MED ORDER — DAPAGLIFLOZIN PROPANEDIOL 10 MG PO TABS
10.0000 mg | ORAL_TABLET | Freq: Every day | ORAL | 1 refills | Status: DC
Start: 2023-06-09 — End: 2023-06-28

## 2023-06-09 NOTE — Addendum Note (Signed)
Addended by: Angela Adam on: 06/09/2023 02:43 PM   Modules accepted: Orders

## 2023-06-09 NOTE — Progress Notes (Signed)
Testosterone injection given to right thigh.  Patient tolerated well.

## 2023-06-10 ENCOUNTER — Ambulatory Visit: Payer: Medicare HMO

## 2023-06-15 ENCOUNTER — Other Ambulatory Visit: Payer: Self-pay | Admitting: Family Medicine

## 2023-06-15 DIAGNOSIS — I4821 Permanent atrial fibrillation: Secondary | ICD-10-CM

## 2023-06-15 DIAGNOSIS — E1169 Type 2 diabetes mellitus with other specified complication: Secondary | ICD-10-CM

## 2023-06-15 NOTE — Telephone Encounter (Signed)
Called patient - medication all figured out

## 2023-06-17 ENCOUNTER — Telehealth: Payer: Self-pay

## 2023-06-17 ENCOUNTER — Other Ambulatory Visit (HOSPITAL_COMMUNITY): Payer: Self-pay

## 2023-06-17 NOTE — Telephone Encounter (Signed)
Pharmacy Patient Advocate Encounter   Received notification from CoverMyMeds that prior authorization for Dapagliflozin 10mg  is required/requested.   Insurance verification completed.   The patient is insured through  [employer]  .   Per test claim: The current 90 day co-pay is, $0 for Farxiga 10mg .  No PA needed at this time. This test claim was processed through [facility]- copay amounts may vary at other pharmacies due to pharmacy/plan contracts, or as the patient moves through the different stages of their insurance plan.

## 2023-06-21 ENCOUNTER — Telehealth: Payer: Self-pay

## 2023-06-21 NOTE — Telephone Encounter (Signed)
Pharmacy Patient Advocate Encounter  Received notification from [facility] that Prior Authorization for Dapagliflozin Propanediol 10MG  tablets has been DENIED.  Full denial letter will be uploaded to the media tab. See denial reason below.   PA #/Case ID/Reference #: [id number]

## 2023-06-21 NOTE — Telephone Encounter (Signed)
Verne Carrow (Key: [id number]) PA Case ID #: [id number] Rx #: [id number] Need Help? Call us at [phone] Status sent iconSent to Plan today Drug Dapagliflozin Propanediol 10MG  tablets ePA cloud Psychologist, educational Medicare Electronic PA Form [phone] NCPDP) Original Claim Info [phone] PRECERT REQ BY MD [phone] DRUG REQUIRES PRIOR AUTHORIZATION

## 2023-06-22 ENCOUNTER — Other Ambulatory Visit (HOSPITAL_COMMUNITY): Payer: Self-pay

## 2023-06-22 NOTE — Telephone Encounter (Signed)
Ran test claim for brand name Richard Crawford. Received paid claim. Copay is $0. Insurance will only pay for the brand name, not the generic.

## 2023-06-23 ENCOUNTER — Ambulatory Visit (INDEPENDENT_AMBULATORY_CARE_PROVIDER_SITE_OTHER): Payer: Medicare HMO

## 2023-06-23 DIAGNOSIS — E291 Testicular hypofunction: Secondary | ICD-10-CM | POA: Diagnosis not present

## 2023-06-23 NOTE — Progress Notes (Signed)
Testosterone injection given to left thigh.  Patient tolerated well. ?

## 2023-06-24 ENCOUNTER — Ambulatory Visit: Payer: Medicare HMO

## 2023-06-28 ENCOUNTER — Other Ambulatory Visit: Payer: Self-pay | Admitting: Family Medicine

## 2023-06-28 DIAGNOSIS — E1169 Type 2 diabetes mellitus with other specified complication: Secondary | ICD-10-CM

## 2023-06-28 MED ORDER — DAPAGLIFLOZIN PROPANEDIOL 10 MG PO TABS
10.0000 mg | ORAL_TABLET | Freq: Every day | ORAL | 1 refills | Status: DC
Start: 2023-06-28 — End: 2023-12-13

## 2023-07-07 ENCOUNTER — Ambulatory Visit: Payer: Medicare HMO

## 2023-07-07 DIAGNOSIS — E291 Testicular hypofunction: Secondary | ICD-10-CM

## 2023-07-07 NOTE — Progress Notes (Signed)
Testosterone injection given to right thigh.  Patient tolerated well.

## 2023-07-08 ENCOUNTER — Ambulatory Visit: Payer: Medicare HMO

## 2023-07-09 ENCOUNTER — Other Ambulatory Visit: Payer: Self-pay | Admitting: Family Medicine

## 2023-07-09 DIAGNOSIS — E1169 Type 2 diabetes mellitus with other specified complication: Secondary | ICD-10-CM

## 2023-07-21 ENCOUNTER — Ambulatory Visit (INDEPENDENT_AMBULATORY_CARE_PROVIDER_SITE_OTHER): Payer: Medicare HMO

## 2023-07-21 DIAGNOSIS — E291 Testicular hypofunction: Secondary | ICD-10-CM

## 2023-07-21 NOTE — Progress Notes (Signed)
Testosterone injection given to left thigh.  Patient tolerated well. ?

## 2023-07-22 ENCOUNTER — Other Ambulatory Visit: Payer: Self-pay | Admitting: Family Medicine

## 2023-07-22 ENCOUNTER — Ambulatory Visit: Payer: Medicare HMO

## 2023-07-22 DIAGNOSIS — E1169 Type 2 diabetes mellitus with other specified complication: Secondary | ICD-10-CM

## 2023-07-27 ENCOUNTER — Telehealth: Payer: Self-pay

## 2023-07-27 NOTE — Telephone Encounter (Signed)
Brand name Marcelline Deist was sent in place of generic, insurance covered.

## 2023-07-28 ENCOUNTER — Other Ambulatory Visit: Payer: Self-pay | Admitting: Family Medicine

## 2023-07-28 DIAGNOSIS — E291 Testicular hypofunction: Secondary | ICD-10-CM

## 2023-07-28 MED ORDER — TESTOSTERONE CYPIONATE 200 MG/ML IM SOLN
INTRAMUSCULAR | 0 refills | Status: DC
Start: 2023-07-28 — End: 2023-08-26

## 2023-08-02 ENCOUNTER — Ambulatory Visit: Payer: Medicare HMO | Admitting: Family Medicine

## 2023-08-02 ENCOUNTER — Encounter: Payer: Self-pay | Admitting: Family Medicine

## 2023-08-02 ENCOUNTER — Ambulatory Visit (INDEPENDENT_AMBULATORY_CARE_PROVIDER_SITE_OTHER): Payer: Medicare HMO

## 2023-08-02 VITALS — BP 114/67 | HR 119 | Temp 98.4°F | Ht 75.0 in | Wt 367.2 lb

## 2023-08-02 DIAGNOSIS — M25512 Pain in left shoulder: Secondary | ICD-10-CM | POA: Diagnosis not present

## 2023-08-02 DIAGNOSIS — G8929 Other chronic pain: Secondary | ICD-10-CM

## 2023-08-02 NOTE — Progress Notes (Signed)
Subjective:  Patient ID: Richard Crawford, male    DOB: 12-25-1955, 67 y.o.   MRN: [id number]  Patient Care Team: Sonny Masters, FNP as PCP - General (Family Medicine) Rollene Rotunda, MD as PCP - Cardiology (Cardiology)   Chief Complaint:  Shoulder Pain (Left shoulder pain - has been going on for years but has gotten worse )   HPI: Richard Crawford is a 67 y.o. male presenting on 08/02/2023 for Shoulder Pain (Left shoulder pain - has been going on for years but has gotten worse )   Discussed the use of AI scribe software for clinical note transcription with the patient, who gave verbal consent to proceed.  History of Present Illness   The patient, known with a history of right shoulder replacement in 2005, presents with complaints of left shoulder pain. He reports that the pain has been progressively worsening, but it became significantly severe on the day he made the appointment. The pain was so intense that it limited his range of motion to a minimal level. However, he notes some improvement in the pain since then, which he attributes to an increase in his gabapentin dosage. The pain is so severe that it interferes with daily activities such as holding a cup or reaching out to grab items. He denies any recent injuries to the shoulder.  The patient also mentions a previous consultation with an orthopedic specialist in [place], who after an x-ray examination, suggested that he was on his way to needing a shoulder replacement.          Relevant past medical, surgical, family, and social history reviewed and updated as indicated.  Allergies and medications reviewed and updated. Data reviewed: Chart in Epic.   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/14/2020   Migraine    Papillary thyroid carcinoma (HCC)    Postoperative hypothyroidism    Primary osteoarthritis of left shoulder    Sleep apnea    Venous stasis dermatitis of both lower extremities     Past Surgical History:  Procedure Laterality Date   ANKLE FUSION     KNEE ARTHROSCOPY     SHOULDER ARTHROSCOPY     TOTAL SHOULDER REPLACEMENT Right     Social History   Socioeconomic History   Marital status: Married    Spouse name: Not on file   Number of children: Not on file   Years of education: Not on file   Highest education level: 12th grade  Occupational History   Occupation: retired  Tobacco Use   Smoking status: Never   Smokeless tobacco: Never  Substance and Sexual Activity   Alcohol use: Not Currently   Drug use: Never   Sexual activity: Not on file  Other Topics Concern   Not on file  Social History Narrative   Lives at home with wife - Originally from [place]; moved here so wife could be closer to her grandchildren (second wife)   Social Determinants of Health   Financial Resource Strain: Low Risk  (01/24/2023)   Overall Financial Resource Strain (CARDIA)  Difficulty of Paying Living Expenses: Not very hard  Food Insecurity: No Food Insecurity (01/24/2023)   Hunger Vital Sign    Worried About Running Out of Food in the Last Year: Never true    Ran Out of Food in the Last Year: Never true  Transportation Needs: No Transportation Needs (01/24/2023)   PRAPARE - Administrator, Civil Service (Medical): No    Lack of Transportation (Non-Medical): No  Physical Activity: Unknown (01/24/2023)   Exercise Vital Sign    Days of Exercise per Week: 0 days    Minutes of Exercise per Session: Not on file  Stress: No Stress Concern Present (01/24/2023)   [employer] of Occupational Health - Occupational Stress Questionnaire    Feeling of Stress : Only  a little  Social Connections: Socially Isolated (01/24/2023)   Social Connection and Isolation Panel [NHANES]    Frequency of Communication with Friends and Family: Once a week    Frequency of Social Gatherings with Friends and Family: Once a week    Attends Religious Services: Never    Database administrator or Organizations: No    Attends Engineer, structural: Not on file    Marital Status: Married  Intimate Partner Violence: Unknown (01/14/2022)   Received from [employer], [facility]   HITS    Physically Hurt: Not on file    Insult or Talk Down To: Not on file    Threaten Physical Harm: Not on file    Scream or Curse: Not on file    Outpatient Encounter Medications as of 08/02/2023  Medication Sig   allopurinol (ZYLOPRIM) 100 MG tablet Take 1 tablet (100 mg total) by mouth daily.   atorvastatin (LIPITOR) 20 MG tablet Take 1 tablet (20 mg total) by mouth daily.   carbamide peroxide (DEBROX) 6.5 % OTIC solution Place 5 drops into both ears 2 (two) times daily.   dapagliflozin propanediol (FARXIGA) 10 MG TABS tablet Take 1 tablet (10 mg total) by mouth daily before breakfast.   DULoxetine (CYMBALTA) 30 MG capsule Take 2 capsules (60 mg total) by mouth daily.   gabapentin (NEURONTIN) 100 MG capsule Take 2 capsules (200 mg total) by mouth 3 (three) times daily.   glucose blood test strip Check BS daily and as needed Dx E11.9   levothyroxine (SYNTHROID) 200 MCG tablet TAKE ONE TABLET DAILY BEFORE BREAKFAST   levothyroxine (SYNTHROID) 50 MCG tablet TAKE 1 TABLET DAILY BEFORE BREAKFAST. (TAKE IN ADDITION TO TABLET FOR TOTAL DOSE 250 MCG)   metoprolol tartrate (LOPRESSOR) 100 MG tablet TAKE (2) TABLETS DAILY   Multiple Vitamin (MULTIVITAMIN) tablet Take 1 tablet by mouth 2 (two) times daily.   Prodigy Lancets 28G MISC Check BS daily and as needed Dx E11.9   rivaroxaban (XARELTO) 20 MG TABS tablet TAKE 1 TABLET DAILY WITH   SUPPER   RYBELSUS 7 MG TABS TAKE 1 TABLET DAILY    sildenafil (VIAGRA) 100 MG tablet Take 0.5-1 tablets (50-100 mg total) by mouth daily as needed for erectile dysfunction.   SYRINGE-NEEDLE, DISP, 3 ML 20G X 1-1/2" 3 ML MISC 1 each by Does not apply route once a week.   testosterone cypionate (DEPOTESTOSTERONE CYPIONATE) 200 MG/ML injection 150 MG TOTAL INTO THE MUSCLE EVERY 14 DAYS   valsartan (DIOVAN) 80 MG tablet Take 1 tablet (80 mg total) by mouth daily.   Facility-Administered Encounter Medications as of 08/02/2023  Medication   testosterone cypionate (DEPOTESTOSTERONE CYPIONATE) injection 175 mg  Allergies  Allergen Reactions   Morphine And Codeine Nausea And Vomiting and Nausea Only    Pertinent ROS per HPI, otherwise unremarkable      Objective:  BP 114/67   Pulse (!) 119   Temp 98.4 F (36.9 C) (Temporal)   Ht 6\' 3"  (1.905 m)   Wt (!) 367 lb 3.2 oz (166.6 kg)   SpO2 94%   BMI 45.90 kg/m    Wt Readings from Last 3 Encounters:  08/02/23 (!) 367 lb 3.2 oz (166.6 kg)  05/24/23 (!) 371 lb 8 oz (168.5 kg)  02/09/23 (!) 369 lb 12.8 oz (167.7 kg)    Physical Exam Vitals and nursing note reviewed.  Constitutional:      General: He is not in acute distress.    Appearance: Normal appearance. He is morbidly obese. He is not ill-appearing or diaphoretic.  HENT:     Head: Normocephalic and atraumatic.     Mouth/Throat:     Mouth: Mucous membranes are moist.  Eyes:     Conjunctiva/sclera: Conjunctivae normal.     Pupils: Pupils are equal, round, and reactive to light.  Cardiovascular:     Rate and Rhythm: Normal rate. Rhythm irregularly irregular.     Pulses: Normal pulses.     Heart sounds: Normal heart sounds.  Pulmonary:     Effort: Pulmonary effort is normal.     Breath sounds: Normal breath sounds.  Musculoskeletal:     Left shoulder: Tenderness and bony tenderness present. No swelling, deformity, effusion, laceration or crepitus. Decreased range of motion. Normal strength. Normal pulse.     Left upper  arm: Normal.     Cervical back: Normal.     Right lower leg: Edema present.     Left lower leg: Edema present.  Skin:    General: Skin is warm and dry.     Capillary Refill: Capillary refill takes less than 2 seconds.  Neurological:     General: No focal deficit present.     Mental Status: He is alert and oriented to person, place, and time.     Gait: Gait abnormal (antalgic, using cane).  Psychiatric:        Mood and Affect: Mood normal.        Behavior: Behavior normal.        Thought Content: Thought content normal.        Judgment: Judgment normal.    Joint Injection/Arthrocentesis  Date/Time: 08/02/2023 4:46 PM  Performed by: Sonny Masters, FNP Authorized by: Sonny Masters, FNP  Indications: pain and joint swelling  Body area: shoulder Joint: left acromioclavicular joint Local anesthesia used: yes  Anesthesia: Local anesthesia used: yes Local Anesthetic: co-phenylcaine spray  Sedation: Patient sedated: no  Preparation: Patient was prepped and draped in the usual sterile fashion. Needle size: 22 G Ultrasound guidance: no Approach: posterior Aspirate amount: 0 mL Methylprednisolone amount: 60 mg Lidocaine 2% amount: 3.5 mL Patient tolerance: patient tolerated the procedure well with no immediate complications      Results for orders placed or performed in visit on 05/24/23  Bayer DCA Hb A1c Waived  Result Value Ref Range   HB A1C (BAYER DCA - WAIVED) 7.2 (H) 4.8 - 5.6 %  Thyroid Panel With TSH  Result Value Ref Range   TSH 0.212 (L) 0.450 - 4.500 uIU/mL   T4, Total 7.9 4.5 - 12.0 ug/dL   T3 Uptake Ratio 33 24 - 39 %   Free Thyroxine Index 2.6 1.2 -  4.9  CMP14+EGFR  Result Value Ref Range   Glucose 131 (H) 70 - 99 mg/dL   BUN 20 8 - 27 mg/dL   Creatinine, Ser 6.57 0.76 - 1.27 mg/dL   eGFR 72 >84 ON/GEX/5.28   BUN/Creatinine Ratio 18 10 - 24   Sodium 142 134 - 144 mmol/L   Potassium 4.5 3.5 - 5.2 mmol/L   Chloride 102 96 - 106 mmol/L   CO2 27 20  - 29 mmol/L   Calcium 9.3 8.6 - 10.2 mg/dL   Total Protein 7.8 6.0 - 8.5 g/dL   Albumin 4.3 3.9 - 4.9 g/dL   Globulin, Total 3.5 1.5 - 4.5 g/dL   Bilirubin Total 0.8 0.0 - 1.2 mg/dL   Alkaline Phosphatase 162 (H) 44 - 121 IU/L   AST 37 0 - 40 IU/L   ALT 38 0 - 44 IU/L  CBC with Differential/Platelet  Result Value Ref Range   WBC 7.9 3.4 - 10.8 x10E3/uL   RBC 5.76 4.14 - 5.80 x10E6/uL   Hemoglobin 15.1 13.0 - 17.7 g/dL   Hematocrit 41.3 24.4 - 51.0 %   MCV 84 79 - 97 fL   MCH 26.2 (L) 26.6 - 33.0 pg   MCHC 31.1 (L) 31.5 - 35.7 g/dL   RDW 01.0 (H) 27.2 - 53.6 %   Platelets 201 150 - 450 x10E3/uL   Neutrophils 43 Not Estab. %   Lymphs 36 Not Estab. %   Monocytes 16 Not Estab. %   Eos 4 Not Estab. %   Basos 1 Not Estab. %   Neutrophils Absolute 3.4 1.4 - 7.0 x10E3/uL   Lymphocytes Absolute 2.9 0.7 - 3.1 x10E3/uL   Monocytes Absolute 1.3 (H) 0.1 - 0.9 x10E3/uL   EOS (ABSOLUTE) 0.3 0.0 - 0.4 x10E3/uL   Basophils Absolute 0.1 0.0 - 0.2 x10E3/uL   Immature Granulocytes 0 Not Estab. %   Immature Grans (Abs) 0.0 0.0 - 0.1 x10E3/uL  Lipid panel  Result Value Ref Range   Cholesterol, Total 85 (L) 100 - 199 mg/dL   Triglycerides 644 0 - 149 mg/dL   HDL 32 (L) >03 mg/dL   VLDL Cholesterol Cal 21 5 - 40 mg/dL   LDL Chol Calc (NIH) 32 0 - 99 mg/dL   Chol/HDL Ratio 2.7 0.0 - 5.0 ratio  Vitamin B12  Result Value Ref Range   Vitamin B-12 1,132 232 - 1,245 pg/mL  Specimen status report  Result Value Ref Range   specimen status report Comment        Pertinent labs & imaging results that were available during my care of the patient were reviewed by me and considered in my medical decision making.  Assessment & Plan:  Richard Crawford" was seen today for shoulder pain.  Diagnoses and all orders for this visit:  Chronic left shoulder pain -     DG Shoulder Left     Assessment and Plan    Left Shoulder Pain Chronic pain, worsening recently. No new injuries. Previous orthopedic  consultation suggested shoulder replacement. Patient is resistant to replacement. -Administer corticosteroid injection into left shoulder joint today. -Order standing X-ray of left shoulder. -If pain persists post-injection, refer to orthopedic specialist for further evaluation and potential replacement.  Diabetes Elevated blood glucose readings recently, despite patient's report of typically being within range. -Advise patient to monitor blood glucose closely, especially following corticosteroid injection, as this can cause transient hyperglycemia.          Continue all other maintenance medications.  Follow up plan: Return if symptoms worsen or fail to improve.   Continue healthy lifestyle choices, including diet (rich in fruits, vegetables, and lean proteins, and low in salt and simple carbohydrates) and exercise (at least 30 minutes of moderate physical activity daily).  Educational handout given for joint injection  The above assessment and management plan was discussed with the patient. The patient verbalized understanding of and has agreed to the management plan. Patient is aware to call the clinic if they develop any new symptoms or if symptoms persist or worsen. Patient is aware when to return to the clinic for a follow-up visit. Patient educated on when it is appropriate to go to the emergency department.   Kari Baars, FNP-C [facility] [phone]

## 2023-08-04 ENCOUNTER — Ambulatory Visit: Payer: Medicare HMO

## 2023-08-05 ENCOUNTER — Ambulatory Visit (INDEPENDENT_AMBULATORY_CARE_PROVIDER_SITE_OTHER): Payer: Medicare HMO

## 2023-08-05 ENCOUNTER — Ambulatory Visit: Payer: Medicare HMO

## 2023-08-05 DIAGNOSIS — E291 Testicular hypofunction: Secondary | ICD-10-CM | POA: Diagnosis not present

## 2023-08-11 ENCOUNTER — Encounter: Payer: Self-pay | Admitting: Family Medicine

## 2023-08-11 ENCOUNTER — Other Ambulatory Visit: Payer: Self-pay | Admitting: *Deleted

## 2023-08-11 DIAGNOSIS — M19012 Primary osteoarthritis, left shoulder: Secondary | ICD-10-CM

## 2023-08-12 MED ORDER — METHYLPREDNISOLONE ACETATE 40 MG/ML IJ SUSP
40.0000 mg | Freq: Once | INTRAMUSCULAR | Status: AC
Start: 2023-08-12 — End: 2023-08-02
  Administered 2023-08-02: 60 mg via INTRAMUSCULAR

## 2023-08-12 NOTE — Addendum Note (Signed)
Addended by: Lorelee Cover C on: 08/12/2023 10:30 AM   Modules accepted: Orders

## 2023-08-18 ENCOUNTER — Ambulatory Visit (INDEPENDENT_AMBULATORY_CARE_PROVIDER_SITE_OTHER): Payer: Medicare HMO

## 2023-08-18 DIAGNOSIS — E291 Testicular hypofunction: Secondary | ICD-10-CM | POA: Diagnosis not present

## 2023-08-18 NOTE — Progress Notes (Signed)
Testosterone injection given to patient and tolerated well.

## 2023-08-19 ENCOUNTER — Ambulatory Visit: Payer: Medicare HMO

## 2023-08-23 ENCOUNTER — Other Ambulatory Visit: Payer: Self-pay | Admitting: Family Medicine

## 2023-08-23 DIAGNOSIS — E1141 Type 2 diabetes mellitus with diabetic mononeuropathy: Secondary | ICD-10-CM

## 2023-08-23 DIAGNOSIS — G8929 Other chronic pain: Secondary | ICD-10-CM

## 2023-08-26 ENCOUNTER — Encounter: Payer: Self-pay | Admitting: Family Medicine

## 2023-08-26 ENCOUNTER — Ambulatory Visit: Payer: Medicare HMO | Admitting: Family Medicine

## 2023-08-26 VITALS — BP 124/63 | HR 90 | Temp 97.3°F | Ht 75.0 in | Wt 367.0 lb

## 2023-08-26 DIAGNOSIS — I152 Hypertension secondary to endocrine disorders: Secondary | ICD-10-CM

## 2023-08-26 DIAGNOSIS — Z1211 Encounter for screening for malignant neoplasm of colon: Secondary | ICD-10-CM

## 2023-08-26 DIAGNOSIS — I4821 Permanent atrial fibrillation: Secondary | ICD-10-CM | POA: Diagnosis not present

## 2023-08-26 DIAGNOSIS — Z1212 Encounter for screening for malignant neoplasm of rectum: Secondary | ICD-10-CM

## 2023-08-26 DIAGNOSIS — E1169 Type 2 diabetes mellitus with other specified complication: Secondary | ICD-10-CM | POA: Diagnosis not present

## 2023-08-26 DIAGNOSIS — R3915 Urgency of urination: Secondary | ICD-10-CM

## 2023-08-26 DIAGNOSIS — E291 Testicular hypofunction: Secondary | ICD-10-CM | POA: Insufficient documentation

## 2023-08-26 DIAGNOSIS — Z6841 Body Mass Index (BMI) 40.0 and over, adult: Secondary | ICD-10-CM

## 2023-08-26 DIAGNOSIS — E1141 Type 2 diabetes mellitus with diabetic mononeuropathy: Secondary | ICD-10-CM

## 2023-08-26 DIAGNOSIS — R351 Nocturia: Secondary | ICD-10-CM

## 2023-08-26 DIAGNOSIS — E1159 Type 2 diabetes mellitus with other circulatory complications: Secondary | ICD-10-CM

## 2023-08-26 DIAGNOSIS — E890 Postprocedural hypothyroidism: Secondary | ICD-10-CM

## 2023-08-26 DIAGNOSIS — E785 Hyperlipidemia, unspecified: Secondary | ICD-10-CM

## 2023-08-26 LAB — BAYER DCA HB A1C WAIVED: HB A1C (BAYER DCA - WAIVED): 7.1 % — ABNORMAL HIGH (ref 4.8–5.6)

## 2023-08-26 MED ORDER — TAMSULOSIN HCL 0.4 MG PO CAPS: 0.4000 mg | ORAL_CAPSULE | Freq: Every evening | ORAL | 3 refills | Status: DC

## 2023-08-26 MED ORDER — TESTOSTERONE CYPIONATE 200 MG/ML IM SOLN
INTRAMUSCULAR | 1 refills | Status: DC
Start: 2023-08-26 — End: 2023-10-13

## 2023-08-26 MED ORDER — RYBELSUS 14 MG PO TABS: 14.0000 mg | ORAL_TABLET | Freq: Every day | ORAL | 1 refills | Status: DC

## 2023-08-26 NOTE — Patient Instructions (Addendum)

## 2023-08-26 NOTE — Progress Notes (Signed)
Subjective:  Patient ID: Richard Crawford, male    DOB: 1955-11-01, 67 y.o.   MRN: [id number]  Patient Care Team: Sonny Masters, FNP as PCP - General (Family Medicine) Rollene Rotunda, MD as PCP - Cardiology (Cardiology)   Chief Complaint:  Diabetes (3 month follow up )   HPI: Richard Crawford is a 67 y.o. male presenting on 08/26/2023 for Diabetes (3 month follow up )   Discussed the use of AI scribe software for clinical note transcription with the patient, who gave verbal consent to proceed.  History of Present Illness   The patient, with a history of diabetes, heart failure, and urinary frequency, presents for a routine follow-up. He reports good control of his diabetes, with blood sugars consistently in range, despite a recent week-long trip to [place] where he indulged in pizza, chicken, cookies, and cake. The highest reading he recalls recently was 141, which is higher than his usual readings. He reports constant thirst, which he attributes to his diabetes.  The patient also reports increased urinary frequency, which he attributes to a previous medication, referred to as "piss pills," that he believes caused lasting damage. He reports needing to urinate three to four times a night, and experiencing urgency when he needs to urinate. He has not been on the medication for a while, but believes the effects are still present.  In addition to these issues, the patient reports numbness in his feet, which he describes as feeling like he is always wearing shoes. He sees a podiatrist regularly for care of his feet, including callus removal. He also reports swelling in one leg, which he attributes to not wearing his brace for a week while on a trip.  The patient has upcoming appointments with an orthopedic doctor for his shoulder and an ophthalmologist for cataract surgery. He also mentions needing a colonoscopy and an echocardiogram, the latter due to his history of heart failure. He has not  seen his cardiologist in over a year.          Relevant past medical, surgical, family, and social history reviewed and updated as indicated.  Allergies and medications reviewed and updated. Data reviewed: Chart in Epic.   Past Medical History:  Diagnosis Date   Anxiety    Arthritis    left shoulder   Atrial fibrillation (HCC)    Cancer (HCC) 1989   thyroid   Chronic heart failure with preserved ejection fraction (HCC)    Diabetes mellitus without complication (HCC)    Hemorrhoids    external   Hx of colonic polyps    Hyperlipidemia    Hypertension    Low testosterone 12/14/2020   Migraine    Papillary thyroid carcinoma (HCC)    Postoperative hypothyroidism    Primary osteoarthritis of left shoulder    Sleep apnea    Venous stasis dermatitis of both lower extremities     Past Surgical History:  Procedure Laterality Date   ANKLE FUSION     KNEE ARTHROSCOPY     SHOULDER ARTHROSCOPY     TOTAL SHOULDER REPLACEMENT Right     Social History   Socioeconomic History   Marital status: Married    Spouse name: Not on file   Number of children: Not on file   Years of education: Not on file   Highest education level: 12th grade  Occupational History   Occupation: retired  Tobacco Use   Smoking status: Never   Smokeless tobacco: Never  Substance and Sexual  Activity   Alcohol use: Not Currently   Drug use: Never   Sexual activity: Not on file  Other Topics Concern   Not on file  Social History Narrative   Lives at home with wife - Originally from [place]; moved here so wife could be closer to her grandchildren (second wife)   Social Determinants of Health   Financial Resource Strain: Low Risk  (01/24/2023)   Overall Financial Resource Strain (CARDIA)    Difficulty of Paying Living Expenses: Not very hard  Food Insecurity: No Food Insecurity (01/24/2023)   Hunger Vital Sign    Worried About Running Out of Food in the Last Year: Never true    Ran Out of Food  in the Last Year: Never true  Transportation Needs: No Transportation Needs (01/24/2023)   PRAPARE - Administrator, Civil Service (Medical): No    Lack of Transportation (Non-Medical): No  Physical Activity: Unknown (01/24/2023)   Exercise Vital Sign    Days of Exercise per Week: 0 days    Minutes of Exercise per Session: Not on file  Stress: No Stress Concern Present (01/24/2023)   [employer] of Occupational Health - Occupational Stress Questionnaire    Feeling of Stress : Only a little  Social Connections: Socially Isolated (01/24/2023)   Social Connection and Isolation Panel [NHANES]    Frequency of Communication with Friends and Family: Once a week    Frequency of Social Gatherings with Friends and Family: Once a week    Attends Religious Services: Never    Database administrator or Organizations: No    Attends Engineer, structural: Not on file    Marital Status: Married  Intimate Partner Violence: Unknown (01/14/2022)   Received from [employer], [facility]   HITS    Physically Hurt: Not on file    Insult or Talk Down To: Not on file    Threaten Physical Harm: Not on file    Scream or Curse: Not on file    Outpatient Encounter Medications as of 08/26/2023  Medication Sig   allopurinol (ZYLOPRIM) 100 MG tablet Take 1 tablet (100 mg total) by mouth daily.   atorvastatin (LIPITOR) 20 MG tablet Take 1 tablet (20 mg total) by mouth daily.   carbamide peroxide (DEBROX) 6.5 % OTIC solution Place 5 drops into both ears 2 (two) times daily.   dapagliflozin propanediol (FARXIGA) 10 MG TABS tablet Take 1 tablet (10 mg total) by mouth daily before breakfast.   DULoxetine (CYMBALTA) 30 MG capsule Take 2 capsules (60 mg total) by mouth daily.   gabapentin (NEURONTIN) 100 MG capsule TAKE 2 CAPSULES 3 TIMES A  DAY   glucose blood test strip Check BS daily and as needed Dx E11.9   levothyroxine (SYNTHROID) 200 MCG tablet TAKE ONE TABLET DAILY BEFORE BREAKFAST    levothyroxine (SYNTHROID) 50 MCG tablet TAKE 1 TABLET DAILY BEFORE BREAKFAST. (TAKE IN ADDITION TO TABLET FOR TOTAL DOSE 250 MCG)   metoprolol tartrate (LOPRESSOR) 100 MG tablet TAKE (2) TABLETS DAILY   Multiple Vitamin (MULTIVITAMIN) tablet Take 1 tablet by mouth 2 (two) times daily.   Prodigy Lancets 28G MISC Check BS daily and as needed Dx E11.9   rivaroxaban (XARELTO) 20 MG TABS tablet TAKE 1 TABLET DAILY WITH   SUPPER   Semaglutide (RYBELSUS) 14 MG TABS Take 1 tablet (14 mg total) by mouth daily.   sildenafil (VIAGRA) 100 MG tablet Take 0.5-1 tablets (50-100 mg total) by mouth daily  as needed for erectile dysfunction.   SYRINGE-NEEDLE, DISP, 3 ML 20G X 1-1/2" 3 ML MISC 1 each by Does not apply route once a week.   tamsulosin (FLOMAX) 0.4 MG CAPS capsule Take 1 capsule (0.4 mg total) by mouth at bedtime.   valsartan (DIOVAN) 80 MG tablet Take 1 tablet (80 mg total) by mouth daily.   [DISCONTINUED] RYBELSUS 7 MG TABS TAKE 1 TABLET DAILY   [DISCONTINUED] testosterone cypionate (DEPOTESTOSTERONE CYPIONATE) 200 MG/ML injection 150 MG TOTAL INTO THE MUSCLE EVERY 14 DAYS   testosterone cypionate (DEPOTESTOSTERONE CYPIONATE) 200 MG/ML injection 150 MG TOTAL INTO THE MUSCLE EVERY 14 DAYS   Facility-Administered Encounter Medications as of 08/26/2023  Medication   testosterone cypionate (DEPOTESTOSTERONE CYPIONATE) injection 175 mg    Allergies  Allergen Reactions   Morphine And Codeine Nausea And Vomiting and Nausea Only    Pertinent ROS per HPI, otherwise unremarkable      Objective:  BP 124/63   Pulse 90   Temp (!) 97.3 F (36.3 C) (Temporal)   Ht 6\' 3"  (1.905 m)   Wt (!) 367 lb (166.5 kg)   SpO2 97%   BMI 45.87 kg/m    Wt Readings from Last 3 Encounters:  08/26/23 (!) 367 lb (166.5 kg)  08/02/23 (!) 367 lb 3.2 oz (166.6 kg)  05/24/23 (!) 371 lb 8 oz (168.5 kg)    Physical Exam Vitals and nursing note reviewed.  Constitutional:      General: He is not in acute  distress.    Appearance: Normal appearance. He is well-developed and well-groomed. He is morbidly obese. He is not ill-appearing, toxic-appearing or diaphoretic.  HENT:     Head: Normocephalic and atraumatic.     Jaw: There is normal jaw occlusion.     Right Ear: Hearing normal.     Left Ear: Hearing normal.     Nose: Nose normal.     Mouth/Throat:     Lips: Pink.     Mouth: Mucous membranes are moist.     Pharynx: Oropharynx is clear. Uvula midline.  Eyes:     General: Lids are normal.     Comments: Has on dark glasses  Neck:     Trachea: Trachea and phonation normal.  Cardiovascular:     Rate and Rhythm: Normal rate. Rhythm irregularly irregular.     Chest Wall: PMI is not displaced.     Pulses: Normal pulses.     Heart sounds: Normal heart sounds. No murmur heard.    No friction rub. No gallop.  Pulmonary:     Effort: Pulmonary effort is normal.     Breath sounds: Normal breath sounds.  Abdominal:     General: Bowel sounds are normal. There is no abdominal bruit.     Palpations: Abdomen is soft. There is no hepatomegaly or splenomegaly.  Musculoskeletal:        General: Normal range of motion.     Cervical back: Normal range of motion and neck supple.     Right lower leg: Edema (trace) present.     Left lower leg: 1+ Edema present.  Skin:    General: Skin is warm and dry.     Capillary Refill: Capillary refill takes less than 2 seconds.     Coloration: Skin is not cyanotic, jaundiced or pale.     Findings: No rash.  Neurological:     General: No focal deficit present.     Mental Status: He is alert and oriented to person,  place, and time.     Sensory: Sensation is intact.     Motor: Motor function is intact.     Coordination: Coordination is intact.     Gait: Gait is intact.     Deep Tendon Reflexes: Reflexes are normal and symmetric.  Psychiatric:        Attention and Perception: Attention and perception normal.        Mood and Affect: Mood and affect normal.         Speech: Speech normal.        Behavior: Behavior normal. Behavior is cooperative.        Thought Content: Thought content normal.        Cognition and Memory: Cognition and memory normal.        Judgment: Judgment normal.      Results for orders placed or performed in visit on 05/24/23  Bayer DCA Hb A1c Waived  Result Value Ref Range   HB A1C (BAYER DCA - WAIVED) 7.2 (H) 4.8 - 5.6 %  Thyroid Panel With TSH  Result Value Ref Range   TSH 0.212 (L) 0.450 - 4.500 uIU/mL   T4, Total 7.9 4.5 - 12.0 ug/dL   T3 Uptake Ratio 33 24 - 39 %   Free Thyroxine Index 2.6 1.2 - 4.9  CMP14+EGFR  Result Value Ref Range   Glucose 131 (H) 70 - 99 mg/dL   BUN 20 8 - 27 mg/dL   Creatinine, Ser 1.61 0.76 - 1.27 mg/dL   eGFR 72 >09 UE/AVW/0.98   BUN/Creatinine Ratio 18 10 - 24   Sodium 142 134 - 144 mmol/L   Potassium 4.5 3.5 - 5.2 mmol/L   Chloride 102 96 - 106 mmol/L   CO2 27 20 - 29 mmol/L   Calcium 9.3 8.6 - 10.2 mg/dL   Total Protein 7.8 6.0 - 8.5 g/dL   Albumin 4.3 3.9 - 4.9 g/dL   Globulin, Total 3.5 1.5 - 4.5 g/dL   Bilirubin Total 0.8 0.0 - 1.2 mg/dL   Alkaline Phosphatase 162 (H) 44 - 121 IU/L   AST 37 0 - 40 IU/L   ALT 38 0 - 44 IU/L  CBC with Differential/Platelet  Result Value Ref Range   WBC 7.9 3.4 - 10.8 x10E3/uL   RBC 5.76 4.14 - 5.80 x10E6/uL   Hemoglobin 15.1 13.0 - 17.7 g/dL   Hematocrit 11.9 14.7 - 51.0 %   MCV 84 79 - 97 fL   MCH 26.2 (L) 26.6 - 33.0 pg   MCHC 31.1 (L) 31.5 - 35.7 g/dL   RDW 82.9 (H) 56.2 - 13.0 %   Platelets 201 150 - 450 x10E3/uL   Neutrophils 43 Not Estab. %   Lymphs 36 Not Estab. %   Monocytes 16 Not Estab. %   Eos 4 Not Estab. %   Basos 1 Not Estab. %   Neutrophils Absolute 3.4 1.4 - 7.0 x10E3/uL   Lymphocytes Absolute 2.9 0.7 - 3.1 x10E3/uL   Monocytes Absolute 1.3 (H) 0.1 - 0.9 x10E3/uL   EOS (ABSOLUTE) 0.3 0.0 - 0.4 x10E3/uL   Basophils Absolute 0.1 0.0 - 0.2 x10E3/uL   Immature Granulocytes 0 Not Estab. %   Immature Grans (Abs)  0.0 0.0 - 0.1 x10E3/uL  Lipid panel  Result Value Ref Range   Cholesterol, Total 85 (L) 100 - 199 mg/dL   Triglycerides 865 0 - 149 mg/dL   HDL 32 (L) >78 mg/dL   VLDL Cholesterol Cal 21 5 - 40 mg/dL  LDL Chol Calc (NIH) 32 0 - 99 mg/dL   Chol/HDL Ratio 2.7 0.0 - 5.0 ratio  Vitamin B12  Result Value Ref Range   Vitamin B-12 1,132 232 - 1,245 pg/mL  Specimen status report  Result Value Ref Range   specimen status report Comment        Pertinent labs & imaging results that were available during my care of the patient were reviewed by me and considered in my medical decision making.  Assessment & Plan:  Richard Crawford" was seen today for diabetes.  Diagnoses and all orders for this visit:  Type 2 diabetes mellitus with other specified complication, without long-term current use of insulin (HCC) -     CMP14+EGFR -     CBC with Differential/Platelet -     Bayer DCA Hb A1c Waived -     Semaglutide (RYBELSUS) 14 MG TABS; Take 1 tablet (14 mg total) by mouth daily.  Morbid obesity with BMI of 50.0-59.9, adult (HCC) -     CMP14+EGFR -     CBC with Differential/Platelet -     Bayer DCA Hb A1c Waived  Diabetic mononeuropathy associated with type 2 diabetes mellitus (HCC) -     CMP14+EGFR -     CBC with Differential/Platelet -     Bayer DCA Hb A1c Waived  Hyperlipidemia associated with type 2 diabetes mellitus (HCC) -     CMP14+EGFR  Permanent atrial fibrillation (HCC) -     CMP14+EGFR -     CBC with Differential/Platelet  Hypertension associated with diabetes (HCC) -     CMP14+EGFR -     CBC with Differential/Platelet  Postoperative hypothyroidism -     Thyroid Panel With TSH  Testosterone deficiency in male -     testosterone cypionate (DEPOTESTOSTERONE CYPIONATE) 200 MG/ML injection; 150 MG TOTAL INTO THE MUSCLE EVERY 14 DAYS  Nocturia -     tamsulosin (FLOMAX) 0.4 MG CAPS capsule; Take 1 capsule (0.4 mg total) by mouth at bedtime. -     PSA, total and  free  Urinary urgency -     tamsulosin (FLOMAX) 0.4 MG CAPS capsule; Take 1 capsule (0.4 mg total) by mouth at bedtime. -     PSA, total and free  Screening for colorectal cancer -     Ambulatory referral to Gastroenterology     Assessment and Plan    Type 2 Diabetes Mellitus Blood sugar levels generally well-controlled, recent high of 141 mg/dL. A1c at 7.1%, slightly above target of 7%. Currently on 7 mg Rybelsus, may need to increase to 14 mg for better glycemic control. Discussed benefits and potential side effects of increasing Rybelsus. Patient prefers to increase dose if A1c remains above 7%. - Increase Rybelsus to 14 mg daily. Take two 7 mg tablets until new prescription arrives. - Order A1c test.  Heart Failure Reports fluid retention and frequent urination, likely related to heart failure. Last echocardiogram in 2022 showed normal results. Discussed importance of regular follow-ups and echocardiograms. Patient agrees to follow up with cardiology. - Order echocardiogram. - Refer to cardiology for follow-up.  Benign Prostatic Hyperplasia (BPH) Reports frequent urination, especially nocturia, likely due to BPH. No pain reported. Discussed use of Flomax to reduce urgency and frequency, and potential side effect of dizziness when standing. - Prescribe Flomax at bedtime. - Advise to get up slowly to avoid dizziness.  Hypothyroidism Currently on 250 mcg Synthroid. Reports issues with generic levothyroxine. Discussed potential benefits of switching to brand Synthroid if  issues persist. - Continue Synthroid 250 mcg. - Consider switching to brand Synthroid if issues persist.  Venous Insufficiency Reports leg swelling and discoloration, likely due to venous insufficiency. Uses compression stockings and CeraVe lotion. Discussed importance of continued use of compression stockings and skin care. - Continue using compression stockings. - Continue using CeraVe lotion.  General Health  Maintenance Needs routine screenings and follow-ups. Discussed importance of regular screenings for early detection and management of potential health issues. - Order PSA and thyroid labs. - Refer for colonoscopy. - Document diabetic foot check with Dr. Ulice Brilliant.  Follow-up - Schedule follow-up with cardiology. - Schedule echocardiogram. - Schedule follow-up for A1c test results. - Schedule colonoscopy consultation.          Continue all other maintenance medications.  Follow up plan: Return in about 3 months (around 11/26/2023) for DM.   Continue healthy lifestyle choices, including diet (rich in fruits, vegetables, and lean proteins, and low in salt and simple carbohydrates) and exercise (at least 30 minutes of moderate physical activity daily).  Educational handout given for DM  The above assessment and management plan was discussed with the patient. The patient verbalized understanding of and has agreed to the management plan. Patient is aware to call the clinic if they develop any new symptoms or if symptoms persist or worsen. Patient is aware when to return to the clinic for a follow-up visit. Patient educated on when it is appropriate to go to the emergency department.   Kari Baars, FNP-C [facility] [phone]

## 2023-08-27 LAB — CBC WITH DIFFERENTIAL/PLATELET
Basophils Absolute: 0.1 10*3/uL (ref 0.0–0.2)
Basos: 1 %
EOS (ABSOLUTE): 0.3 10*3/uL (ref 0.0–0.4)
Eos: 3 %
Hematocrit: 51 % (ref 37.5–51.0)
Hemoglobin: 16.3 g/dL (ref 13.0–17.7)
Immature Grans (Abs): 0 10*3/uL (ref 0.0–0.1)
Immature Granulocytes: 0 %
Lymphocytes Absolute: 3 10*3/uL (ref 0.7–3.1)
Lymphs: 36 %
MCH: 28.2 pg (ref 26.6–33.0)
MCHC: 32 g/dL (ref 31.5–35.7)
MCV: 88 fL (ref 79–97)
Monocytes Absolute: 1 10*3/uL — ABNORMAL HIGH (ref 0.1–0.9)
Monocytes: 12 %
Neutrophils Absolute: 3.9 10*3/uL (ref 1.4–7.0)
Neutrophils: 48 %
Platelets: 187 10*3/uL (ref 150–450)
RBC: 5.77 x10E6/uL (ref 4.14–5.80)
RDW: 14 % (ref 11.6–15.4)
WBC: 8.2 10*3/uL (ref 3.4–10.8)

## 2023-08-27 LAB — CMP14+EGFR
ALT: 33 [IU]/L (ref 0–44)
AST: 28 [IU]/L (ref 0–40)
Albumin: 4.2 g/dL (ref 3.9–4.9)
Alkaline Phosphatase: 172 [IU]/L — ABNORMAL HIGH (ref 44–121)
BUN/Creatinine Ratio: 12 (ref 10–24)
BUN: 15 mg/dL (ref 8–27)
Bilirubin Total: 1 mg/dL (ref 0.0–1.2)
CO2: 27 mmol/L (ref 20–29)
Calcium: 9.2 mg/dL (ref 8.6–10.2)
Chloride: 101 mmol/L (ref 96–106)
Creatinine, Ser: 1.23 mg/dL (ref 0.76–1.27)
Globulin, Total: 3.4 g/dL (ref 1.5–4.5)
Glucose: 220 mg/dL — ABNORMAL HIGH (ref 70–99)
Potassium: 4.7 mmol/L (ref 3.5–5.2)
Sodium: 142 mmol/L (ref 134–144)
Total Protein: 7.6 g/dL (ref 6.0–8.5)
eGFR: 64 mL/min/{1.73_m2} (ref >59)

## 2023-08-28 LAB — SPECIMEN STATUS REPORT

## 2023-08-29 LAB — THYROID PANEL WITH TSH
Free Thyroxine Index: 1.9 (ref 1.2–4.9)
T3 Uptake Ratio: 26 % (ref 24–39)
T4, Total: 7.4 ug/dL (ref 4.5–12.0)
TSH: 0.566 u[IU]/mL (ref 0.450–4.500)

## 2023-08-29 LAB — PSA, TOTAL AND FREE
PSA, Free Pct: 38.8 %
PSA, Free: 0.31 ng/mL
Prostate Specific Ag, Serum: 0.8 ng/mL (ref 0.0–4.0)

## 2023-08-29 LAB — SPECIMEN STATUS REPORT

## 2023-09-01 ENCOUNTER — Ambulatory Visit: Payer: Medicare HMO

## 2023-09-01 ENCOUNTER — Ambulatory Visit (INDEPENDENT_AMBULATORY_CARE_PROVIDER_SITE_OTHER): Payer: Medicare HMO

## 2023-09-01 DIAGNOSIS — E291 Testicular hypofunction: Secondary | ICD-10-CM

## 2023-09-01 NOTE — Progress Notes (Signed)
Testosterone injection given to left thigh per patient request.  Patient tolerated well.

## 2023-09-12 ENCOUNTER — Ambulatory Visit: Payer: Medicare HMO | Admitting: Gastroenterology

## 2023-09-14 ENCOUNTER — Encounter: Payer: Self-pay | Admitting: Gastroenterology

## 2023-09-14 ENCOUNTER — Ambulatory Visit: Payer: Medicare HMO | Admitting: Gastroenterology

## 2023-09-14 ENCOUNTER — Telehealth: Payer: Self-pay | Admitting: *Deleted

## 2023-09-14 VITALS — BP 118/66 | HR 87 | Temp 98.6°F | Ht 75.0 in | Wt 363.2 lb

## 2023-09-14 DIAGNOSIS — Z860101 Personal history of adenomatous and serrated colon polyps: Secondary | ICD-10-CM | POA: Diagnosis not present

## 2023-09-14 DIAGNOSIS — K625 Hemorrhage of anus and rectum: Secondary | ICD-10-CM | POA: Diagnosis not present

## 2023-09-14 NOTE — Telephone Encounter (Signed)
  Request for patient to stop medication prior to procedure or is needing cleareance  09/14/23  Richard Crawford 1956/01/03  What type of surgery is being performed? Colonoscopy  When is surgery scheduled? TBD  What type of clearance is required (medical or pharmacy to hold medication or both? medication  Are there any medications that need to be held prior to surgery and how long? Xarelto x 2 days   Name of physician performing surgery?  Dr.[facility] [employer]: [phone] Fax: [phone]  Anethesia type (none, local, MAC, general)? MAC

## 2023-09-14 NOTE — Patient Instructions (Signed)
We will work on getting approval to hold your Xarelto for 48 hours before your procedure. We will be in touch to schedule colonoscopy.

## 2023-09-14 NOTE — Progress Notes (Signed)
GI Office Note    Referring Provider: Sonny Masters, FNP Primary Care Physician:  Sonny Masters, FNP  Primary Gastroenterologist: Hennie Duos. Marletta Lor, DO   Chief Complaint   Chief Complaint  Patient presents with   New Patient (Initial Visit)    Pt referred for colonoscopy     History of Present Illness   Richard Crawford is a 67 y.o. male presenting today for at the request of Gilford Silvius, FNP for screening for colorectal cancer. Patient has extensive history of polyps, see below.   From the note of Darnelle Spangle, MD ([facility]) "The patient is a 67 y.o. male who presents for evaluation of colon polyps. He reports that he had a colonoscopy when he was in the 2nd grade for evaluation of rectal bleeding during which he had a couple of polyps (likely juvenile polyps) removed. 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 A sigmoidoscopy 3 months later showed no residual tissue. In August 2018, he had a screening colonoscopy which found multiple small adenomatous polyps in his ascending and descending colon and a 2.5 x 2.0 cm rectal polyp, which was removed by piecemeal resection. Pathology of the rectal polyp was a villous adenoma. He was recommended to have a follow-up colonoscopy or sigmoidoscopy at 3 months to assess the adequacy of the resection. However, before this was scheduled, he relocated to [facility]. There is no family history of colon polyps, but he states that an aunt had colon cancer and his mother had breast cancer."  Colonoscopy 05/2018 at [facility]: report not available.Path showed multiple tubular adenomas  Today: Usually pretty regular stools. Occasional constipation. Some brbpr in the toilet and with wiping. Does have hemorrhoids at times flare/hurts. No black stools. No abdominal pain. No  heartburn, dysphagia, n/v. Wants to do January after holidays.   Medications   Current Outpatient Medications  Medication Sig Dispense Refill   allopurinol (ZYLOPRIM) 100 MG tablet Take 1 tablet (100 mg total) by mouth daily. 30 tablet 6   atorvastatin (LIPITOR) 20 MG tablet Take 1 tablet (20 mg total) by mouth daily. 90 tablet 1   dapagliflozin propanediol (FARXIGA) 10 MG TABS tablet Take 1 tablet (10 mg total) by mouth daily before breakfast. 90 tablet 1   DULoxetine (CYMBALTA) 30 MG capsule Take 2 capsules (60 mg total) by mouth daily. 180 capsule 2   gabapentin (NEURONTIN) 100 MG capsule TAKE 2 CAPSULES 3 TIMES A  DAY 270 capsule 0   glucose blood test strip Check BS daily and as needed Dx E11.9 100 each 3   levothyroxine (SYNTHROID) 200 MCG tablet TAKE ONE TABLET DAILY BEFORE BREAKFAST 90 tablet 1   levothyroxine (SYNTHROID) 50 MCG tablet TAKE 1 TABLET DAILY BEFORE BREAKFAST. (TAKE IN ADDITION TO TABLET FOR TOTAL DOSE 250 MCG) 90 tablet 1   metoprolol tartrate (LOPRESSOR) 100 MG tablet TAKE (2) TABLETS DAILY 180 tablet 1   Multiple Vitamin (MULTIVITAMIN) tablet Take 1 tablet by mouth 2 (two) times daily.     Prodigy Lancets 28G MISC Check BS daily and as needed Dx E11.9 100 each 3   rivaroxaban (XARELTO) 20 MG TABS tablet TAKE 1 TABLET DAILY WITH   SUPPER 90 tablet 0   Semaglutide (RYBELSUS) 14 MG TABS Take 1 tablet (14 mg total) by mouth daily.  90 tablet 1   sildenafil (VIAGRA) 100 MG tablet Take 0.5-1 tablets (50-100 mg total) by mouth daily as needed for erectile dysfunction. 10 tablet 11   SYRINGE-NEEDLE, DISP, 3 ML 20G X 1-1/2" 3 ML MISC 1 each by Does not apply route once a week. 50 each 0   testosterone cypionate (DEPOTESTOSTERONE CYPIONATE) 200 MG/ML injection 150 MG TOTAL INTO THE MUSCLE EVERY 14 DAYS 6 mL 1   valsartan (DIOVAN) 80 MG tablet Take 1 tablet (80 mg total) by mouth daily. 90 tablet 1   Current Facility-Administered Medications  Medication Dose Route  Frequency Provider Last Rate Last Admin   testosterone cypionate (DEPOTESTOSTERONE CYPIONATE) injection 175 mg  175 mg Intramuscular Q14 Days Sonny Masters, FNP   175 mg at 09/01/23 1430    Allergies   Allergies as of 09/14/2023 - Review Complete 09/14/2023  Allergen Reaction Noted   Morphine and codeine Nausea And Vomiting and Nausea Only 05/10/2012    Past Medical History   Past Medical History:  Diagnosis Date   Anxiety    Arthritis    left shoulder   Atrial fibrillation (HCC)    Cancer (HCC) 1989   thyroid   Chronic heart failure with preserved ejection fraction (HCC)    Diabetes mellitus without complication (HCC)    Hemorrhoids    external   Hx of colonic polyps    Hyperlipidemia    Hypertension    Low testosterone 12/14/2020   Migraine    Papillary thyroid carcinoma (HCC)    Postoperative hypothyroidism    Primary osteoarthritis of left shoulder    Sleep apnea    Venous stasis dermatitis of both lower extremities     Past Surgical History   Past Surgical History:  Procedure Laterality Date   ANKLE FUSION     KNEE ARTHROSCOPY     SHOULDER ARTHROSCOPY     TOTAL SHOULDER REPLACEMENT Right     Past Family History   Family History  Problem Relation Age of Onset   Hypertension Mother    Dementia Mother    Breast cancer Mother    Stomach cancer Mother    Ovarian cancer Mother    Heart disease Father     Past Social History   Social History   Socioeconomic History   Marital status: Married    Spouse name: Not on file   Number of children: Not on file   Years of education: Not on file   Highest education level: 12th grade  Occupational History   Occupation: retired  Tobacco Use   Smoking status: Never   Smokeless tobacco: Never  Substance and Sexual Activity   Alcohol use: Not Currently   Drug use: Never   Sexual activity: Not on file  Other Topics Concern   Not on file  Social History Narrative   Lives at home with wife - Originally  from [place]; moved here so wife could be closer to her grandchildren (second wife)   Social Determinants of Health   Financial Resource Strain: Low Risk  (01/24/2023)   Overall Financial Resource Strain (CARDIA)    Difficulty of Paying Living Expenses: Not very hard  Food Insecurity: No Food Insecurity (01/24/2023)   Hunger Vital Sign    Worried About Running Out of Food in the Last Year: Never true    Ran Out of Food in the Last Year: Never true  Transportation Needs: No Transportation Needs (01/24/2023)   PRAPARE - Transportation    Lack of Transportation (  Medical): No    Lack of Transportation (Non-Medical): No  Physical Activity: Unknown (01/24/2023)   Exercise Vital Sign    Days of Exercise per Week: 0 days    Minutes of Exercise per Session: Not on file  Stress: No Stress Concern Present (01/24/2023)   [employer] of Occupational Health - Occupational Stress Questionnaire    Feeling of Stress : Only a little  Social Connections: Socially Isolated (01/24/2023)   Social Connection and Isolation Panel [NHANES]    Frequency of Communication with Friends and Family: Once a week    Frequency of Social Gatherings with Friends and Family: Once a week    Attends Religious Services: Never    Database administrator or Organizations: No    Attends Engineer, structural: Not on file    Marital Status: Married  Intimate Partner Violence: Unknown (01/14/2022)   Received from [employer], [facility]   HITS    Physically Hurt: Not on file    Insult or Talk Down To: Not on file    Threaten Physical Harm: Not on file    Scream or Curse: Not on file    Review of Systems   General: Negative for anorexia, weight loss, fever, chills, fatigue, weakness. Eyes: Negative for vision changes.  ENT: Negative for hoarseness, difficulty swallowing , nasal congestion. CV: Negative for chest pain, angina, palpitations, dyspnea on exertion, +peripheral edema. Perm afib Respiratory:  Negative for dyspnea at rest, dyspnea on exertion, cough, sputum, wheezing.  GI: See history of present illness. GU:  Negative for dysuria, hematuria, urinary incontinence, urinary frequency, nocturnal urination.  MS:+ joint pain, low back pain.  Derm: Negative for rash or itching.  Neuro: Negative for weakness, abnormal sensation, seizure, frequent headaches, memory loss,  confusion.  Psych: Negative for anxiety, depression, suicidal ideation, hallucinations.  Endo: Negative for unusual weight change.  Heme: Negative for bruising or bleeding. Allergy: Negative for rash or hives.  Physical Exam   BP 118/66   Pulse 87   Temp 98.6 F (37 C)   Ht 6\' 3"  (1.905 m)   Wt (!) 363 lb 3.2 oz (164.7 kg)   BMI 45.40 kg/m    General: Well-nourished, well-developed in no acute distress.  Head: Normocephalic, atraumatic.   Eyes: Conjunctiva pink, no icterus. Mouth: Oropharyngeal mucosa moist and pink  Neck: Supple without thyromegaly, masses, or lymphadenopathy.  Lungs: Clear to auscultation bilaterally.  Heart: Regular rate and rhythm, no murmurs rubs or gallops.  Abdomen: Bowel sounds are normal, nontender, nondistended, no hepatosplenomegaly or masses,  no abdominal bruits or hernia, no rebound or guarding.   Rectal: not performed Extremities: 1+ lower extremity edema. No clubbing or deformities.  Neuro: Alert and oriented x 4 , grossly normal neurologically.  Skin: Warm and dry, no rash or jaundice.   Psych: Alert and cooperative, normal mood and affect.  Labs   Lab Results  Component Value Date   NA 142 08/26/2023   CL 101 08/26/2023   K 4.7 08/26/2023   CO2 27 08/26/2023   BUN 15 08/26/2023   CREATININE 1.23 08/26/2023   EGFR 64 08/26/2023   CALCIUM 9.2 08/26/2023   ALBUMIN 4.2 08/26/2023   GLUCOSE 220 (H) 08/26/2023   Lab Results  Component Value Date   WBC 8.2 08/26/2023   HGB 16.3 08/26/2023   HCT 51.0 08/26/2023   MCV 88 08/26/2023   PLT 187 08/26/2023   Lab  Results  Component Value Date   TSH 0.566 08/26/2023  Lab Results  Component Value Date   HGBA1C 7.1 (H) 08/26/2023   Lab Results  Component Value Date   VITAMINB12 1,132 05/24/2023    Imaging Studies   No results found.  Assessment/Plan:   H/O colon polyps/rectal bleeding: -last colonoscopy in 2019, history of numerous advanced polyps in the past and multiple tubular adenomas on last procedure. Due for surveillance colonoscopy -will need to get approval to hold xarelto for 48 hours.  -ASA 3.  I have discussed the risks, alternatives, benefits with regards to but not limited to the risk of reaction to medication, bleeding, infection, perforation and the patient is agreeable to proceed. Written consent to be obtained. -patient wants colonoscopy after holidays.     Leanna Battles. Melvyn Neth, MHS, PA-C [facility]

## 2023-09-15 ENCOUNTER — Ambulatory Visit (INDEPENDENT_AMBULATORY_CARE_PROVIDER_SITE_OTHER): Payer: Medicare HMO | Admitting: *Deleted

## 2023-09-15 DIAGNOSIS — E291 Testicular hypofunction: Secondary | ICD-10-CM | POA: Diagnosis not present

## 2023-09-15 DIAGNOSIS — R7989 Other specified abnormal findings of blood chemistry: Secondary | ICD-10-CM

## 2023-09-15 NOTE — Progress Notes (Signed)
Testosterone injection given  right thigh intramuscular. Patient tolerated well.

## 2023-09-20 NOTE — Telephone Encounter (Addendum)
Ok to schedule per original orders. Hold xarelto 48 hours before. Patient had wanted after Holidays.

## 2023-09-21 NOTE — Telephone Encounter (Signed)
LMTRC

## 2023-09-26 NOTE — Telephone Encounter (Signed)
[facility]  TCS w/Dr.Carver, asa 3

## 2023-09-29 ENCOUNTER — Ambulatory Visit (INDEPENDENT_AMBULATORY_CARE_PROVIDER_SITE_OTHER): Payer: Medicare HMO

## 2023-09-29 DIAGNOSIS — E291 Testicular hypofunction: Secondary | ICD-10-CM | POA: Diagnosis not present

## 2023-09-29 NOTE — Progress Notes (Signed)
Patient is in office today for a nurse visit for Testosterone Injection. Patient Injection was given in the  Left quadriceps. Patient tolerated injection well.

## 2023-10-07 ENCOUNTER — Other Ambulatory Visit: Payer: Self-pay | Admitting: Family Medicine

## 2023-10-07 ENCOUNTER — Other Ambulatory Visit: Payer: Self-pay | Admitting: Family

## 2023-10-07 DIAGNOSIS — E1169 Type 2 diabetes mellitus with other specified complication: Secondary | ICD-10-CM

## 2023-10-07 DIAGNOSIS — M10041 Idiopathic gout, right hand: Secondary | ICD-10-CM

## 2023-10-07 DIAGNOSIS — G8929 Other chronic pain: Secondary | ICD-10-CM

## 2023-10-07 DIAGNOSIS — E1141 Type 2 diabetes mellitus with diabetic mononeuropathy: Secondary | ICD-10-CM

## 2023-10-10 ENCOUNTER — Other Ambulatory Visit: Payer: Self-pay | Admitting: *Deleted

## 2023-10-10 ENCOUNTER — Encounter: Payer: Self-pay | Admitting: *Deleted

## 2023-10-10 MED ORDER — PEG 3350-KCL-NA BICARB-NACL 420 G PO SOLR: 4000.0000 mL | Freq: Once | ORAL | 0 refills | Status: AC

## 2023-10-10 NOTE — Telephone Encounter (Signed)
Pt has been scheduled for 11/07/23. Instructions mailed and prep sent to the pharmacy.

## 2023-10-13 ENCOUNTER — Ambulatory Visit: Payer: Medicare HMO

## 2023-10-13 ENCOUNTER — Encounter: Payer: Self-pay | Admitting: *Deleted

## 2023-10-13 ENCOUNTER — Telehealth: Payer: Self-pay

## 2023-10-13 ENCOUNTER — Other Ambulatory Visit: Payer: Self-pay | Admitting: Family Medicine

## 2023-10-13 DIAGNOSIS — E291 Testicular hypofunction: Secondary | ICD-10-CM

## 2023-10-13 MED ORDER — TESTOSTERONE CYPIONATE 200 MG/ML IM SOLN
INTRAMUSCULAR | 0 refills | Status: DC
Start: 2023-10-13 — End: 2023-12-14

## 2023-10-13 NOTE — Telephone Encounter (Signed)
Contacted patient. Notified patient. Patient verbalized understanding 

## 2023-10-13 NOTE — Telephone Encounter (Signed)
 Patient requesting refill of testosterone.  He has upcoming appointment on 11/30/23.  Please advise

## 2023-10-14 ENCOUNTER — Ambulatory Visit (INDEPENDENT_AMBULATORY_CARE_PROVIDER_SITE_OTHER): Payer: Medicare HMO | Admitting: *Deleted

## 2023-10-14 DIAGNOSIS — R7989 Other specified abnormal findings of blood chemistry: Secondary | ICD-10-CM

## 2023-10-14 DIAGNOSIS — E291 Testicular hypofunction: Secondary | ICD-10-CM | POA: Diagnosis not present

## 2023-10-14 NOTE — Progress Notes (Signed)
 Patient is in the office today for his bi-weekly Testosterone injection. Tolerated well.

## 2023-10-18 ENCOUNTER — Other Ambulatory Visit: Payer: Self-pay | Admitting: Family Medicine

## 2023-10-18 DIAGNOSIS — I4821 Permanent atrial fibrillation: Secondary | ICD-10-CM

## 2023-10-18 MED ORDER — RIVAROXABAN 20 MG PO TABS: 20.0000 mg | ORAL_TABLET | Freq: Every day | ORAL | 0 refills | Status: DC

## 2023-10-21 ENCOUNTER — Other Ambulatory Visit: Payer: Self-pay | Admitting: Family Medicine

## 2023-10-21 DIAGNOSIS — Z85850 Personal history of malignant neoplasm of thyroid: Secondary | ICD-10-CM

## 2023-10-27 ENCOUNTER — Ambulatory Visit (INDEPENDENT_AMBULATORY_CARE_PROVIDER_SITE_OTHER): Payer: Medicare HMO

## 2023-10-27 DIAGNOSIS — E291 Testicular hypofunction: Secondary | ICD-10-CM

## 2023-10-27 DIAGNOSIS — R7989 Other specified abnormal findings of blood chemistry: Secondary | ICD-10-CM

## 2023-10-27 NOTE — Progress Notes (Signed)
Patient is in the office today for his bi-weekly Testosterone injection. Tolerated well.

## 2023-10-27 NOTE — Patient Instructions (Addendum)
Richard Crawford  10/27/2023     @PREFPERIOPPHARMACY @   Your procedure is scheduled on 11/07/2023.   [facility] to [facility] at  0815 A.M.   Call this number if you have problems the morning of surgery:  [phone]  If you experience any cold or flu symptoms such as cough, fever, chills, shortness of breath, etc. between now and your scheduled surgery, please notify us at the above number.   Remember:        Your last dose of farxiga should be on 11/03/2023.       Your last dose of xarelto and semiglutide should be on 11/04/2023.        DO NOT take any medications for diabetes the morning of your procedure.   Follow the diet and prep instructions given to you by the office.   You may drink clear liquids until  0615  am on 11/07/2023.    Clear liquids allowed are:                    Water, Juice (No red color; non-citric and without pulp; diabetics please choose diet or no sugar options), Carbonated beverages (diabetics please choose diet or no sugar options), Clear Tea (No creamer, milk, or cream, including half & half and powdered creamer), Black Coffee Only (No creamer, milk or cream, including half & half and powdered creamer), and Clear Sports drink (No red color; diabetics please choose diet or no sugar options)    Take these medicines the morning of surgery with A SIP OF WATER        allopurinol, duloxetine, gabapentin, levothyroxine, metoprolol.    Do not wear jewelry, make-up or nail polish, including gel polish,  artificial nails, or any other type of covering on natural nails (fingers and  toes).  Do not wear lotions, powders, or perfumes, or deodorant.  Do not shave 48 hours prior to surgery.  Men may shave face and neck.  Do not bring valuables to the hospital.  [facility] is not responsible for any belongings or valuables.  Contacts, dentures or bridgework may not be worn into surgery.  Leave your suitcase in the car.  After surgery it may be brought to your  room.  For patients admitted to the hospital, discharge time will be determined by your treatment team.  Patients discharged the day of surgery will not be allowed to drive home and needs someone with them for 24 hours.    Special instructions:   DO NOT smoke tobacco or vape for 24 hours before your procedure.  Please read over the following fact sheets that you were given. Anesthesia Post-op Instructions and Care and Recovery After Surgery      Colonoscopy, Adult, Care After The following information offers guidance on how to care for yourself after your procedure. Your health care provider may also give you more specific instructions. If you have problems or questions, contact your health care provider. What can I expect after the procedure? After the procedure, it is common to have: A small amount of blood in your stool for 24 hours after the procedure. Some gas. Mild cramping or bloating of your abdomen. Follow these instructions at home: Eating and drinking  Drink enough fluid to keep your urine pale yellow. Follow instructions from your health care provider about eating or drinking restrictions. Resume your normal diet as told by your health care provider. Avoid heavy or fried foods that are hard  to digest. Activity Rest as told by your health care provider. Avoid sitting for a long time without moving. Get up to take short walks every 1-2 hours. This is important to improve blood flow and breathing. Ask for help if you feel weak or unsteady. Return to your normal activities as told by your health care provider. Ask your health care provider what activities are safe for you. Managing cramping and bloating  Try walking around when you have cramps or feel bloated. If directed, apply heat to your abdomen as told by your health care provider. Use the heat source that your health care provider recommends, such as a moist heat pack or a heating pad. Place a towel between your skin  and the heat source. Leave the heat on for 20-30 minutes. Remove the heat if your skin turns bright red. This is especially important if you are unable to feel pain, heat, or cold. You have a greater risk of getting burned. General instructions If you were given a sedative during the procedure, it can affect you for several hours. Do not drive or operate machinery until your health care provider says that it is safe. For the first 24 hours after the procedure: Do not sign important documents. Do not drink alcohol. Do your regular daily activities at a slower pace than normal. Eat soft foods that are easy to digest. Take over-the-counter and prescription medicines only as told by your health care provider. Keep all follow-up visits. This is important. Contact a health care provider if: You have blood in your stool 2-3 days after the procedure. Get help right away if: You have more than a small spotting of blood in your stool. You have large blood clots in your stool. You have swelling of your abdomen. You have nausea or vomiting. You have a fever. You have increasing pain in your abdomen that is not relieved with medicine. These symptoms may be an emergency. Get help right away. Call 911. Do not wait to see if the symptoms will go away. Do not drive yourself to the hospital. Summary After the procedure, it is common to have a small amount of blood in your stool. You may also have mild cramping and bloating of your abdomen. If you were given a sedative during the procedure, it can affect you for several hours. Do not drive or operate machinery until your health care provider says that it is safe. Get help right away if you have a lot of blood in your stool, nausea or vomiting, a fever, or increased pain in your abdomen. This information is not intended to replace advice given to you by your health care provider. Make sure you discuss any questions you have with your health care  provider. Document Revised: 11/09/2022 Document Reviewed: 05/20/2021 Elsevier Patient Education  2024 Elsevier Inc. Monitored Anesthesia Care, Care After The following information offers guidance on how to care for yourself after your procedure. Your health care provider may also give you more specific instructions. If you have problems or questions, contact your health care provider. What can I expect after the procedure? After the procedure, it is common to have: Tiredness. Little or no memory about what happened during or after the procedure. Impaired judgment when it comes to making decisions. Nausea or vomiting. Some trouble with balance. Follow these instructions at home: For the time period you were told by your health care provider:  Rest. Do not participate in activities where you could fall or become injured.  Do not drive or use machinery. Do not drink alcohol. Do not take sleeping pills or medicines that cause drowsiness. Do not make important decisions or sign legal documents. Do not take care of children on your own. Medicines Take over-the-counter and prescription medicines only as told by your health care provider. If you were prescribed antibiotics, take them as told by your health care provider. Do not stop using the antibiotic even if you start to feel better. Eating and drinking Follow instructions from your health care provider about what you may eat and drink. Drink enough fluid to keep your urine pale yellow. If you vomit: Drink clear fluids slowly and in small amounts as you are able. Clear fluids include water, ice chips, low-calorie sports drinks, and fruit juice that has water added to it (diluted fruit juice). Eat light and bland foods in small amounts as you are able. These foods include bananas, applesauce, rice, lean meats, toast, and crackers. General instructions  Have a responsible adult stay with you for the time you are told. It is important to  have someone help care for you until you are awake and alert. If you have sleep apnea, surgery and some medicines can increase your risk for breathing problems. Follow instructions from your health care provider about wearing your sleep device: When you are sleeping. This includes during daytime naps. While taking prescription pain medicines, sleeping medicines, or medicines that make you drowsy. Do not use any products that contain nicotine or tobacco. These products include cigarettes, chewing tobacco, and vaping devices, such as e-cigarettes. If you need help quitting, ask your health care provider. Contact a health care provider if: You feel nauseous or vomit every time you eat or drink. You feel light-headed. You are still sleepy or having trouble with balance after 24 hours. You get a rash. You have a fever. You have redness or swelling around the IV site. Get help right away if: You have trouble breathing. You have new confusion after you get home. These symptoms may be an emergency. Get help right away. Call 911. Do not wait to see if the symptoms will go away. Do not drive yourself to the hospital. This information is not intended to replace advice given to you by your health care provider. Make sure you discuss any questions you have with your health care provider. Document Revised: 02/22/2022 Document Reviewed: 02/22/2022 Elsevier Patient Education  2024 [employer].

## 2023-10-31 ENCOUNTER — Telehealth: Payer: Self-pay | Admitting: *Deleted

## 2023-10-31 ENCOUNTER — Encounter (HOSPITAL_COMMUNITY)
Admission: RE | Admit: 2023-10-31 | Discharge: 2023-10-31 | Disposition: A | Discharge status: Home / Self Care | Payer: Medicare HMO | Source: Ambulatory Visit | Attending: Internal Medicine | Admitting: Internal Medicine

## 2023-10-31 ENCOUNTER — Encounter (HOSPITAL_COMMUNITY): Payer: Self-pay

## 2023-10-31 VITALS — BP 133/70 | HR 67 | Temp 97.8°F | Resp 18 | Ht 75.0 in | Wt 363.1 lb

## 2023-10-31 DIAGNOSIS — E1159 Type 2 diabetes mellitus with other circulatory complications: Secondary | ICD-10-CM | POA: Insufficient documentation

## 2023-10-31 DIAGNOSIS — I152 Hypertension secondary to endocrine disorders: Secondary | ICD-10-CM | POA: Diagnosis not present

## 2023-10-31 DIAGNOSIS — R9431 Abnormal electrocardiogram [ECG] [EKG]: Secondary | ICD-10-CM | POA: Insufficient documentation

## 2023-10-31 DIAGNOSIS — Z01810 Encounter for preprocedural cardiovascular examination: Secondary | ICD-10-CM | POA: Insufficient documentation

## 2023-10-31 DIAGNOSIS — I4821 Permanent atrial fibrillation: Secondary | ICD-10-CM | POA: Insufficient documentation

## 2023-10-31 DIAGNOSIS — Z01818 Encounter for other preprocedural examination: Secondary | ICD-10-CM | POA: Diagnosis present

## 2023-10-31 HISTORY — DX: Cardiac arrhythmia, unspecified: I49.9

## 2023-10-31 NOTE — Telephone Encounter (Signed)
Pt has been scheduled to see Dr. Antoine Poche 10/02/24 @ the [facility] location @ 11 am. Pt is aware of the office location address. I will update all parties involved.

## 2023-10-31 NOTE — Telephone Encounter (Signed)
   Name: Richard Crawford  DOB: 04-05-1956  MRN: [id number]  Primary Cardiologist: Rollene Rotunda, MD  Chart reviewed as part of pre-operative protocol coverage. Because of Richard Crawford past medical history and time since last visit, he will require a follow-up in-office visit in order to better assess preoperative cardiovascular risk. Pt has not been seen in office since 2022.   Pre-op covering staff: - Please schedule appointment and call patient to inform them. If patient already had an upcoming appointment within acceptable timeframe, please add "pre-op clearance" to the appointment notes so provider is aware. - Please contact requesting surgeon's office via preferred method (i.e, phone, fax) to inform them of need for appointment prior to surgery.  This message will also be routed to pharmacy pool for input on holding Xarelto as requested below so that this information is available to the clearing provider at time of patient's appointment.   Joylene Grapes, NP  10/31/2023, 3:41 PM

## 2023-10-31 NOTE — Progress Notes (Signed)
Patient will need cardiac clearance for TCS on 11/07/2023. He was in A Fib today on arrival for PAT.  Last cardiology visit was in 2022 and patient did not follow up in 6 months as instructed. [facility] office notified. We will keep patient on the schedule at this time and hopefully he can be seen by cardiology this week for clearance.

## 2023-10-31 NOTE — Telephone Encounter (Signed)
Patient with diagnosis of afib on Xarelto for anticoagulation.    Procedure: colonoscopy Date of procedure: 11/07/23   CHA2DS2-VASc Score = 4   This indicates a 4.8% annual risk of stroke. The patient's score is based upon: CHF History: 1 HTN History: 1 Diabetes History: 1 Stroke History: 0 Vascular Disease History: 0 Age Score: 1 Gender Score: 0      CrCl 93 ml/min Platelet count 187  Per office protocol, patient can hold Xarelto for 2 days prior to procedure.    **This guidance is not considered finalized until pre-operative APP has relayed final recommendations.**

## 2023-10-31 NOTE — Telephone Encounter (Signed)
  Request for patient to have medical clearance  10/31/23  Jaron Monsees Oct 27, 1955  What type of surgery is being performed? COLONOSCOPY  When is surgery scheduled? 11/07/2023  What type of clearance is required (medical or pharmacy to hold medication or both? medical   Name of physician performing surgery?  Dr. Earnest Bailey [facility] Phone: [phone] Fax: [phone]  Anethesia type (none, local, MAC, general)? MAC

## 2023-11-01 NOTE — Telephone Encounter (Signed)
Okay to schedule ASA 3, Hold Xarelto x2 days, thank you

## 2023-11-01 NOTE — Telephone Encounter (Signed)
Pt scheduled for pre-op clearance 1/23

## 2023-11-02 DIAGNOSIS — M19019 Primary osteoarthritis, unspecified shoulder: Secondary | ICD-10-CM | POA: Insufficient documentation

## 2023-11-02 NOTE — Progress Notes (Unsigned)
Cardiology Office Note:   Date:  11/03/2023  ID:  Richard Crawford, DOB [birth year], MRN [id number] PCP: Richard Masters, Richard Crawford  [facility] Providers Cardiologist:  Richard Rotunda, MD {  History of Present Illness:   Richard Crawford is a 68 y.o. male who presents for follow up of atrial fib.  He has had this for years and was treated in CT.   He has been treated with Betapace.  It does not sound like he is had other management and he was never offered ablation.  However, he has had rate control and anticoagulation. At my first visit with him he was very fatigued so I switched from beta blocker to Cardizem.     Since I last saw him he has had no new chest pressure, neck or arm discomfort.  He has had progressive shortness of breath.  He is short of breath doing mild activities.  He is not describing new PND or orthopnea.  He does not really notice his fibrillation less he gets really agitated.  He has not any presyncope or syncope.  He is had some mild chronic lower extremity edema that has been stable.  He is limited in his activities walking with a cane mostly because of orthopedic issues in his ankle.  He is preop for cataract surgery colonoscopy and left shoulder replacement.   ROS: As stated in the HPI and negative for all other systems.   Studies Reviewed:    EKG:     10/31/2023 atrial fibrillation, rate 70, axis within normal limits, intervals within normal limits, no change from previous.   Risk Assessment/Calculations:    CHA2DS2-VASc Score = 4   This indicates a 4.8% annual risk of stroke. The patient's score is based upon: CHF History: 1 HTN History: 1 Diabetes History: 1 Stroke History: 0 Vascular Disease History: 0 Age Score: 1 Gender Score: 0   Physical Exam:   VS:  BP 118/70   Pulse (!) 107   Ht 6\' 3"  (1.905 m)   Wt (!) 368 lb (166.9 kg)   SpO2 94%   BMI 46.00 kg/m    Wt Readings from Last 3 Encounters:  11/03/23 (!) 368 lb (166.9 kg)  10/31/23 (!) 363 lb  1.6 oz (164.7 kg)  09/14/23 (!) 363 lb 3.2 oz (164.7 kg)     GEN: Well nourished, well developed in no acute distress NECK: No JVD; No carotid bruits CARDIAC: Irregular RR, no murmurs, rubs, gallops RESPIRATORY:  Clear to auscultation without rales, wheezing or rhonchi  ABDOMEN: Soft, non-tender, non-distended EXTREMITIES: Moderate bilateral leg edema; No deformity   ASSESSMENT AND PLAN:   ATRIAL FIB:   He is in chronic atrial fibrillation.   Mr. Hugo Niedzwiecki has a CHA2DS2 - VASc score of 3.  The patient can hold his anticoagulation as necessary for the planned procedures.  He is had good rate control.  No change in therapy.    HTN:  The blood pressure is controlled.  No change in therapy.  DYSLIPIDEMIA:    LDL was 32.  No change in therapy.  SLEEP APNEA:   He wears a CPAP.   CHRONIC DIASTOLIC HF:    He may have some exacerbation of his chronic diastolic heart failure.  I think he is euvolemic but I am going to check a BNP level.  See other workup as below.  SOB:    Prior to his shoulder surgery I am going to order a PET scan to rule out ischemia.  I have a low index of suspicion but he does have some increased dyspnea compared to previous.  He would be able to walk on a treadmill.  PREOP: I think the patient is at acceptable risk for the planned colonoscopy and cataract procedure.  He should have judicious fluid management given his chronic diastolic dysfunction.  Current medicines are reviewed at length with the patient today.  The patient does not have concerns regarding medicines.       Follow up with me in one year  Signed, Richard Rotunda, MD

## 2023-11-03 ENCOUNTER — Encounter: Payer: Self-pay | Admitting: Cardiology

## 2023-11-03 ENCOUNTER — Ambulatory Visit: Payer: Medicare HMO | Attending: Cardiology | Admitting: Cardiology

## 2023-11-03 VITALS — BP 118/70 | HR 107 | Ht 75.0 in | Wt 368.0 lb

## 2023-11-03 DIAGNOSIS — G4730 Sleep apnea, unspecified: Secondary | ICD-10-CM | POA: Diagnosis not present

## 2023-11-03 DIAGNOSIS — I10 Essential (primary) hypertension: Secondary | ICD-10-CM | POA: Diagnosis not present

## 2023-11-03 DIAGNOSIS — I5032 Chronic diastolic (congestive) heart failure: Secondary | ICD-10-CM

## 2023-11-03 DIAGNOSIS — I4821 Permanent atrial fibrillation: Secondary | ICD-10-CM

## 2023-11-03 DIAGNOSIS — R0602 Shortness of breath: Secondary | ICD-10-CM

## 2023-11-03 NOTE — Patient Instructions (Signed)
Medication Instructions:  No changes.  *If you need a refill on your cardiac medications before your next appointment, please call your pharmacy*   Lab Work: BNP today. If you have labs (blood work) drawn today and your tests are completely normal, you will receive your results only by: MyChart Message (if you have MyChart) OR A paper copy in the mail If you have any lab test that is abnormal or we need to change your treatment, we will call you to review the results.    Your physician has requested that you have an echocardiogram. Echocardiography is a painless test that uses sound waves to create images of your heart. It provides your doctor with information about the size and shape of your heart and how well your heart's chambers and valves are working. This procedure takes approximately one hour. There are no restrictions for this procedure. Please do NOT wear cologne, perfume, aftershave, or lotions (deodorant is allowed). Please arrive 15 minutes prior to your appointment time.  Please note: We ask at that you not bring children with you during ultrasound (echo/ vascular) testing. Due to room size and safety concerns, children are not allowed in the ultrasound rooms during exams. Our front office staff cannot provide observation of children in our lobby area while testing is being conducted. An adult accompanying a patient to their appointment will only be allowed in the ultrasound room at the discretion of the ultrasound technician under special circumstances. We apologize for any inconvenience.     Please report to Radiology at the [facility] Main Entrance 30 minutes early for your test.  [address] [place], [place] [address]                         OR    How to Prepare for Your Cardiac PET/CT Stress Test:  Nothing to eat or drink, except water, 3 hours prior to arrival time.  NO caffeine/decaffeinated products, or chocolate 12 hours prior to arrival. (Please  note decaffeinated beverages (teas/coffees) still contain caffeine).  If you have caffeine within 12 hours prior, the test will need to be rescheduled.  Medication instructions: Do not take erectile dysfunction medications for 72 hours prior to test (sildenafil, tadalafil) Do not take nitrates (isosorbide mononitrate, Ranexa) the day before or day of test Do not take tamsulosin the day before or morning of test Hold theophylline containing medications for 12 hours. Hold Dipyridamole 48 hours prior to the test.  Diabetic Preparation: If able to eat breakfast prior to 3 hour fasting, you may take all medications, including your insulin. Do not worry if you miss your breakfast dose of insulin - start at your next meal. If you do not eat prior to 3 hour fast-Hold all diabetes (oral and insulin) medications. Patients who wear a continuous glucose monitor MUST remove the device prior to scanning.  You may take your remaining medications with water.  NO perfume, cologne or lotion on chest or abdomen area. FEMALES - Please avoid wearing dresses to this appointment.  Total time is 1 to 2 hours; you may want to bring reading material for the waiting time.  IF YOU THINK YOU MAY BE PREGNANT, OR ARE NURSING PLEASE INFORM THE TECHNOLOGIST.  In preparation for your appointment, medication and supplies will be purchased.  Appointment availability is limited, so if you need to cancel or reschedule, please call the Radiology Department Scheduler at [phone] 24 hours in advance to avoid  a cancellation fee of $100.00  What to Expect When you Arrive:  Once you arrive and check in for your appointment, you will be taken to a preparation room within the Radiology Department.  A technologist or Nurse will obtain your medical history, verify that you are correctly prepped for the exam, and explain the procedure.  Afterwards, an IV will be started in your arm and electrodes will be placed on your skin for EKG  monitoring during the stress portion of the exam. Then you will be escorted to the PET/CT scanner.  There, staff will get you positioned on the scanner and obtain a blood pressure and EKG.  During the exam, you will continue to be connected to the EKG and blood pressure machines.  A small, safe amount of a radioactive tracer will be injected in your IV to obtain a series of pictures of your heart along with an injection of a stress agent.    After your Exam:  It is recommended that you eat a meal and drink a caffeinated beverage to counter act any effects of the stress agent.  Drink plenty of fluids for the remainder of the day and urinate frequently for the first couple of hours after the exam.  Your doctor will inform you of your test results within 7-10 business days.  For more information and frequently asked questions, please visit our website: [email or web address]  For questions about your test or how to prepare for your test, please call: Cardiac Imaging Nurse Navigators Office: [phone]   Follow-Up: At [facility], you and your health needs are our priority.  As part of our continuing mission to provide you with exceptional heart care, we have created designated Provider Care Teams.  These Care Teams include your primary Cardiologist (physician) and Advanced Practice Providers (APPs -  Physician Assistants and Nurse Practitioners) who all work together to provide you with the care you need, when you need it.  We recommend signing up for the patient portal called "MyChart".  Sign up information is provided on this After Visit Summary.  MyChart is used to connect with patients for Virtual Visits (Telemedicine).  Patients are able to view lab/test results, encounter notes, upcoming appointments, etc.  Non-urgent messages can be sent to your provider as well.   To learn more about what you can do with MyChart, go to [email or web address].    Your next appointment:    1 year(s)  Provider:   Rollene Rotunda, MD

## 2023-11-04 LAB — BRAIN NATRIURETIC PEPTIDE: BNP: 45.2 pg/mL (ref 0.0–100.0)

## 2023-11-07 ENCOUNTER — Ambulatory Visit (HOSPITAL_COMMUNITY): Payer: Medicare HMO | Admitting: Certified Registered"

## 2023-11-07 ENCOUNTER — Encounter (HOSPITAL_COMMUNITY): Payer: Self-pay | Admitting: Internal Medicine

## 2023-11-07 ENCOUNTER — Ambulatory Visit (HOSPITAL_COMMUNITY)
Admission: RE | Admit: 2023-11-07 | Discharge: 2023-11-07 | Disposition: A | Discharge status: Home / Self Care | Payer: Medicare HMO | Source: Ambulatory Visit | Attending: Internal Medicine | Admitting: Internal Medicine

## 2023-11-07 ENCOUNTER — Encounter (HOSPITAL_COMMUNITY)
Admission: RE | Disposition: A | Discharge status: Home / Self Care | Payer: Self-pay | Source: Ambulatory Visit | Attending: Internal Medicine

## 2023-11-07 DIAGNOSIS — Z7984 Long term (current) use of oral hypoglycemic drugs: Secondary | ICD-10-CM | POA: Diagnosis not present

## 2023-11-07 DIAGNOSIS — D123 Benign neoplasm of transverse colon: Secondary | ICD-10-CM

## 2023-11-07 DIAGNOSIS — I10 Essential (primary) hypertension: Secondary | ICD-10-CM

## 2023-11-07 DIAGNOSIS — D122 Benign neoplasm of ascending colon: Secondary | ICD-10-CM | POA: Insufficient documentation

## 2023-11-07 DIAGNOSIS — E119 Type 2 diabetes mellitus without complications: Secondary | ICD-10-CM | POA: Diagnosis not present

## 2023-11-07 DIAGNOSIS — Z1211 Encounter for screening for malignant neoplasm of colon: Secondary | ICD-10-CM | POA: Diagnosis present

## 2023-11-07 DIAGNOSIS — I5032 Chronic diastolic (congestive) heart failure: Secondary | ICD-10-CM | POA: Insufficient documentation

## 2023-11-07 DIAGNOSIS — Z7901 Long term (current) use of anticoagulants: Secondary | ICD-10-CM | POA: Insufficient documentation

## 2023-11-07 DIAGNOSIS — G4730 Sleep apnea, unspecified: Secondary | ICD-10-CM | POA: Diagnosis not present

## 2023-11-07 DIAGNOSIS — K648 Other hemorrhoids: Secondary | ICD-10-CM | POA: Diagnosis not present

## 2023-11-07 DIAGNOSIS — Z860100 Personal history of colon polyps, unspecified: Secondary | ICD-10-CM

## 2023-11-07 DIAGNOSIS — I4891 Unspecified atrial fibrillation: Secondary | ICD-10-CM | POA: Diagnosis not present

## 2023-11-07 DIAGNOSIS — Z860101 Personal history of adenomatous and serrated colon polyps: Secondary | ICD-10-CM

## 2023-11-07 DIAGNOSIS — D120 Benign neoplasm of cecum: Secondary | ICD-10-CM

## 2023-11-07 DIAGNOSIS — K635 Polyp of colon: Secondary | ICD-10-CM | POA: Insufficient documentation

## 2023-11-07 DIAGNOSIS — I110 Hypertensive heart disease with heart failure: Secondary | ICD-10-CM | POA: Diagnosis not present

## 2023-11-07 DIAGNOSIS — D124 Benign neoplasm of descending colon: Secondary | ICD-10-CM

## 2023-11-07 DIAGNOSIS — K644 Residual hemorrhoidal skin tags: Secondary | ICD-10-CM

## 2023-11-07 HISTORY — PX: POLYPECTOMY: SHX5525

## 2023-11-07 HISTORY — PX: COLONOSCOPY WITH PROPOFOL: SHX5780

## 2023-11-07 LAB — GLUCOSE, CAPILLARY: Glucose-Capillary: 83 mg/dL (ref 70–99)

## 2023-11-07 SURGERY — COLONOSCOPY WITH PROPOFOL
Anesthesia: General

## 2023-11-07 MED ORDER — LIDOCAINE HCL (PF) 2 % IJ SOLN
INTRAMUSCULAR | Status: AC
  Filled 2023-11-07: qty 5

## 2023-11-07 MED ORDER — LIDOCAINE HCL (CARDIAC) PF 100 MG/5ML IV SOSY
PREFILLED_SYRINGE | INTRAVENOUS | Status: DC | PRN
  Administered 2023-11-07: 100 mg via INTRAVENOUS

## 2023-11-07 MED ORDER — PROPOFOL 1000 MG/100ML IV EMUL
INTRAVENOUS | Status: AC
Start: 2023-11-07 — End: ?
  Filled 2023-11-07: qty 100

## 2023-11-07 MED ORDER — PROPOFOL 10 MG/ML IV BOLUS
INTRAVENOUS | Status: DC | PRN
  Administered 2023-11-07: 80 mg via INTRAVENOUS

## 2023-11-07 MED ORDER — LACTATED RINGERS IV SOLN: INTRAVENOUS | Status: DC | PRN

## 2023-11-07 MED ORDER — PROPOFOL 500 MG/50ML IV EMUL
INTRAVENOUS | Status: DC | PRN
  Administered 2023-11-07: 150 ug/kg/min via INTRAVENOUS

## 2023-11-07 MED ORDER — DEXMEDETOMIDINE HCL IN NACL 80 MCG/20ML IV SOLN
INTRAVENOUS | Status: DC | PRN
  Administered 2023-11-07: 20 ug via INTRAVENOUS

## 2023-11-07 NOTE — Transfer of Care (Signed)
Immediate Anesthesia Transfer of Care Note  Patient: Richard Crawford  Procedure(s) Performed: COLONOSCOPY WITH PROPOFOL POLYPECTOMY  Patient Location: Short Stay  Anesthesia Type:General  Level of Consciousness: drowsy and patient cooperative  Airway & Oxygen Therapy: Patient Spontanous Breathing and non-rebreather face mask  Post-op Assessment: Report given to RN and Post -op Vital signs reviewed and stable  Post vital signs: Reviewed and stable  Last Vitals:  Vitals Value Taken Time  BP 97/56 11/07/23 1147  Temp 36.7 C 11/07/23 1145  Pulse 74 11/07/23 1145  Resp 19 11/07/23 1145  SpO2 97 % 11/07/23 1145    Last Pain:  Vitals:   11/07/23 1145  TempSrc: Oral  PainSc: 0-No pain      Patients Stated Pain Goal: 5 (11/07/23 0959)  Complications: No notable events documented.

## 2023-11-07 NOTE — Anesthesia Preprocedure Evaluation (Signed)
Anesthesia Evaluation  Patient identified by MRN, date of birth, ID band Patient awake    Reviewed: Allergy & Precautions, H&P , NPO status , Patient's Chart, lab work & pertinent test results, reviewed documented beta blocker date and time   Airway Mallampati: II  TM Distance: >3 FB Neck ROM: full    Dental no notable dental hx.    Pulmonary neg pulmonary ROS, sleep apnea    Pulmonary exam normal breath sounds clear to auscultation       Cardiovascular Exercise Tolerance: Good hypertension, negative cardio ROS + dysrhythmias Atrial Fibrillation  Rhythm:irregular Rate:Normal     Neuro/Psych  Headaches PSYCHIATRIC DISORDERS Anxiety      Neuromuscular disease negative neurological ROS  negative psych ROS   GI/Hepatic negative GI ROS, Neg liver ROS,,,  Endo/Other  negative endocrine ROSdiabetesHypothyroidism    Renal/GU negative Renal ROS  negative genitourinary   Musculoskeletal   Abdominal   Peds  Hematology negative hematology ROS (+)   Anesthesia Other Findings   Reproductive/Obstetrics negative OB ROS                             Anesthesia Physical Anesthesia Plan  ASA: 3  Anesthesia Plan: General   Post-op Pain Management:    Induction:   PONV Risk Score and Plan: Propofol infusion  Airway Management Planned:   Additional Equipment:   Intra-op Plan:   Post-operative Plan:   Informed Consent: I have reviewed the patients History and Physical, chart, labs and discussed the procedure including the risks, benefits and alternatives for the proposed anesthesia with the patient or authorized representative who has indicated his/her understanding and acceptance.     Dental Advisory Given  Plan Discussed with: CRNA  Anesthesia Plan Comments:        Anesthesia Quick Evaluation

## 2023-11-07 NOTE — Op Note (Signed)
[facility] Patient Name: Richard Crawford Procedure Date: 11/07/2023 10:45 AM MRN: [id number] Date of Birth: [birth year] Attending MD: Hennie Duos. Marletta Lor , [place], [phone] CSN: [id number] Age: 68 Admit Type: Outpatient Procedure:                Colonoscopy Indications:              Surveillance: Personal history of colonic polyps                            (unknown histology) on last colonoscopy more than 5                            years ago Providers:                Hennie Duos. Marletta Lor, DO, Buel Ream. Thomasena Edis RN, RN,                            Lennice Sites Technician, Technician Referring MD:              Medicines:                See the Anesthesia note for documentation of the                            administered medications Complications:            No immediate complications. Estimated Blood Loss:     Estimated blood loss was minimal. Procedure:                Pre-Anesthesia Assessment:                           - The anesthesia plan was to use monitored                            anesthesia care (MAC).                           After obtaining informed consent, the colonoscope                            was passed under direct vision. Throughout the                            procedure, the patient's blood pressure, pulse, and                            oxygen saturations were monitored continuously. The                            [id number]-HQ190L ([id number]) scope was introduced through                            the anus and advanced to the the cecum, identified                            by appendiceal orifice and  ileocecal valve. The                            colonoscopy was performed without difficulty. The                            patient tolerated the procedure well. The quality                            of the bowel preparation was evaluated using the                            BBPS [facility] Bowel Preparation Scale) with scores                            of: Right Colon =  2 (minor amount of residual                            staining, small fragments of stool and/or opaque                            liquid, but mucosa seen well), Transverse Colon = 2                            (minor amount of residual staining, small fragments                            of stool and/or opaque liquid, but mucosa seen                            well) and Left Colon = 2 (minor amount of residual                            staining, small fragments of stool and/or opaque                            liquid, but mucosa seen well). The total BBPS score                            equals 6. The quality of the bowel preparation was                            good. Scope In: 11:07:34 AM Scope Out: 11:35:06 AM Scope Withdrawal Time: 0 hours 22 minutes 31 seconds  Total Procedure Duration: 0 hours 27 minutes 32 seconds  Findings:      Hemorrhoids were found on perianal exam.      Non-bleeding internal hemorrhoids were found during retroflexion.      Four sessile polyps were found in the ascending colon and cecum. The       polyps were 5 to 12 mm in size. These polyps were removed with a cold       snare. Resection and retrieval were complete.      Four sessile polyps were found in the  descending colon and transverse       colon. The polyps were 4 to 7 mm in size. These polyps were removed with       a cold snare. Resection and retrieval were complete.      A 4 mm polyp was found in the recto-sigmoid colon. The polyp was       sessile. The polyp was removed with a cold snare. Resection and       retrieval were complete.      The exam was otherwise without abnormality. Impression:               - Hemorrhoids found on perianal exam.                           - Non-bleeding internal hemorrhoids.                           - Four 5 to 12 mm polyps in the ascending colon and                            in the cecum, removed with a cold snare. Resected                            and  retrieved.                           - Four 4 to 7 mm polyps in the descending colon and                            in the transverse colon, removed with a cold snare.                            Resected and retrieved.                           - One 4 mm polyp at the recto-sigmoid colon,                            removed with a cold snare. Resected and retrieved.                           - The examination was otherwise normal. Moderate Sedation:      Per Anesthesia Care Recommendation:           - Patient has a contact number available for                            emergencies. The signs and symptoms of potential                            delayed complications were discussed with the                            patient. Return to normal activities tomorrow.  Written discharge instructions were provided to the                            patient.                           - Resume previous diet.                           - Continue present medications.                           - Await pathology results.                           - Repeat colonoscopy in 3 years for surveillance.                           - Return to GI clinic PRN. Procedure Code(s):        --- Professional ---                           [phone], Colonoscopy, flexible; with removal of                            tumor(s), polyp(s), or other lesion(s) by snare                            technique Diagnosis Code(s):        --- Professional ---                           Z86.010, Personal history of colonic polyps                           D12.2, Benign neoplasm of ascending colon                           D12.0, Benign neoplasm of cecum                           D12.4, Benign neoplasm of descending colon                           D12.3, Benign neoplasm of transverse colon (hepatic                            flexure or splenic flexure)                           D12.7, Benign neoplasm of rectosigmoid  junction                           K64.8, Other hemorrhoids CPT copyright 2022 American Medical Association. All rights reserved. The codes documented in this report are preliminary and upon coder review may  be revised to meet current compliance requirements. Hennie Duos. Marletta Lor, DO Hennie Duos. Marletta Lor, DO  11/07/2023 11:40:01 AM Number of Addenda: 0

## 2023-11-07 NOTE — H&P (Signed)
Primary Care Physician:  Sonny Masters, FNP Primary Gastroenterologist:  Dr. Marletta Lor  Pre-Procedure History & Physical: HPI:  Richard Crawford is a 68 y.o. male is here for a colonoscopy to be performed for surveillance purposes, personal history of adenomatous colon polyps in 2019  Past Medical History:  Diagnosis Date   Anxiety    Arthritis    left shoulder   Atrial fibrillation (HCC)    Cancer (HCC) 1989   thyroid   Chronic heart failure with preserved ejection fraction (HCC)    Diabetes mellitus without complication (HCC)    Dysrhythmia    Hemorrhoids    external   Hx of colonic polyps    Hyperlipidemia    Hypertension    Low testosterone 12/14/2020   Migraine    Papillary thyroid carcinoma (HCC)    Postoperative hypothyroidism    Primary osteoarthritis of left shoulder    Sleep apnea    Venous stasis dermatitis of both lower extremities     Past Surgical History:  Procedure Laterality Date   ANKLE FUSION     KNEE ARTHROSCOPY     REPLACEMENT TOTAL KNEE Right    SHOULDER ARTHROSCOPY     TOTAL KNEE ARTHROPLASTY Left    TOTAL SHOULDER REPLACEMENT Right     Prior to Admission medications   Medication Sig Start Date End Date Taking? Authorizing Provider  allopurinol (ZYLOPRIM) 100 MG tablet TAKE 1 TABLET DAILY 10/07/23  Yes Rakes, Doralee Albino, FNP  atorvastatin (LIPITOR) 20 MG tablet TAKE 1 TABLET DAILY 10/07/23  Yes Rakes, Doralee Albino, FNP  DULoxetine (CYMBALTA) 30 MG capsule Take 2 capsules (60 mg total) by mouth daily. 05/24/23  Yes Rakes, Doralee Albino, FNP  gabapentin (NEURONTIN) 100 MG capsule TAKE 2 CAPSULES 3 TIMES A  DAY 10/07/23  Yes Rakes, Doralee Albino, FNP  levothyroxine (SYNTHROID) 200 MCG tablet TAKE ONE TABLET DAILY BEFORE BREAKFAST 04/19/23  Yes Rakes, Doralee Albino, FNP  levothyroxine (SYNTHROID) 50 MCG tablet TAKE 1 TABLET DAILY BEFORE BREAKFAST (TAKE IN ADDITIONTO TABLET FOR TOTAL DOSE ) 10/21/23  Yes Rakes, Doralee Albino, FNP  metoprolol tartrate (LOPRESSOR) 100 MG tablet  TAKE (2) TABLETS DAILY 04/19/23  Yes Rakes, Doralee Albino, FNP  Multiple Vitamin (MULTIVITAMIN) tablet Take 1 tablet by mouth 2 (two) times daily.   Yes [provider]  testosterone cypionate (DEPOTESTOSTERONE CYPIONATE) 200 MG/ML injection 150 MG TOTAL INTO THE MUSCLE EVERY 14 DAYS 10/13/23  Yes Rakes, Doralee Albino, FNP  valsartan (DIOVAN) 80 MG tablet Take 1 tablet (80 mg total) by mouth daily. 04/19/23  Yes Rakes, Doralee Albino, FNP  dapagliflozin propanediol (FARXIGA) 10 MG TABS tablet Take 1 tablet (10 mg total) by mouth daily before breakfast. 06/28/23   Rakes, Doralee Albino, FNP  glucose blood test strip Check BS daily and as needed Dx E11.9 08/11/22   Sonny Masters, FNP  Prodigy Lancets 28G MISC Check BS daily and as needed Dx E11.9 09/18/19   Sonny Masters, FNP  rivaroxaban (XARELTO) 20 MG TABS tablet Take 1 tablet (20 mg total) by mouth daily with supper. 10/18/23   Sonny Masters, FNP  Semaglutide (RYBELSUS) 14 MG TABS Take 1 tablet (14 mg total) by mouth daily. 08/26/23   Sonny Masters, FNP  sildenafil (VIAGRA) 100 MG tablet Take 0.5-1 tablets (50-100 mg total) by mouth daily as needed for erectile dysfunction. 04/19/23   Rakes, Doralee Albino, FNP  SYRINGE-NEEDLE, DISP, 3 ML 20G X 1-1/2" 3 ML MISC 1 each by Does not  apply route once a week. 10/23/21   Sonny Masters, FNP    Allergies as of 10/10/2023 - Review Complete 09/14/2023  Allergen Reaction Noted   Morphine and codeine Nausea And Vomiting and Nausea Only 05/10/2012    Family History  Problem Relation Age of Onset   Hypertension Mother    Dementia Mother    Breast cancer Mother    Stomach cancer Mother    Ovarian cancer Mother    Heart disease Father     Social History   Socioeconomic History   Marital status: Married    Spouse name: Not on file   Number of children: Not on file   Years of education: Not on file   Highest education level: 12th grade  Occupational History   Occupation: retired  Tobacco Use   Smoking status: Never    Smokeless tobacco: Never  Substance and Sexual Activity   Alcohol use: Not Currently   Drug use: Never   Sexual activity: Not on file  Other Topics Concern   Not on file  Social History Narrative   Lives at home with wife - Originally from [place]; moved here so wife could be closer to her grandchildren (second wife)   Social Drivers of Corporate investment banker Strain: Low Risk  (01/24/2023)   Overall Financial Resource Strain (CARDIA)    Difficulty of Paying Living Expenses: Not very hard  Food Insecurity: No Food Insecurity (01/24/2023)   Hunger Vital Sign    Worried About Running Out of Food in the Last Year: Never true    Ran Out of Food in the Last Year: Never true  Transportation Needs: No Transportation Needs (01/24/2023)   PRAPARE - Administrator, Civil Service (Medical): No    Lack of Transportation (Non-Medical): No  Physical Activity: Unknown (01/24/2023)   Exercise Vital Sign    Days of Exercise per Week: 0 days    Minutes of Exercise per Session: Not on file  Stress: No Stress Concern Present (01/24/2023)   [employer] of Occupational Health - Occupational Stress Questionnaire    Feeling of Stress : Only a little  Social Connections: Socially Isolated (01/24/2023)   Social Connection and Isolation Panel [NHANES]    Frequency of Communication with Friends and Family: Once a week    Frequency of Social Gatherings with Friends and Family: Once a week    Attends Religious Services: Never    Database administrator or Organizations: No    Attends Engineer, structural: Not on file    Marital Status: Married  Intimate Partner Violence: Unknown (01/14/2022)   Received from [employer], [facility]   HITS    Physically Hurt: Not on file    Insult or Talk Down To: Not on file    Threaten Physical Harm: Not on file    Scream or Curse: Not on file    Review of Systems: See HPI, otherwise negative ROS  Physical Exam: Vital signs in last  24 hours: Temp:  [98.1 F (36.7 C)] 98.1 F (36.7 C) (01/27 0959) Pulse Rate:  [71] 71 (01/27 0959) Resp:  [18] 18 (01/27 0959) BP: (162)/(97) 162/97 (01/27 0959) SpO2:  [98 %] 98 % (01/27 0959)   General:   Alert,  Well-developed, well-nourished, pleasant and cooperative in NAD Head:  Normocephalic and atraumatic. Eyes:  Sclera clear, no icterus.   Conjunctiva pink. Ears:  Normal auditory acuity. Nose:  No deformity, discharge,  or lesions. Msk:  Symmetrical without gross deformities. Normal posture. Extremities:  Without clubbing or edema. Neurologic:  Alert and  oriented x4;  grossly normal neurologically. Skin:  Intact without significant lesions or rashes. Psych:  Alert and cooperative. Normal mood and affect.  Impression/Plan: Richard Crawford is here for a colonoscopy to be performed for surveillance purposes, personal history of adenomatous colon polyps in 2019  The risks of the procedure including infection, bleed, or perforation as well as benefits, limitations, alternatives and imponderables have been reviewed with the patient. Questions have been answered. All parties agreeable.

## 2023-11-07 NOTE — Discharge Instructions (Addendum)
  Colonoscopy Discharge Instructions  Read the instructions outlined below and refer to this sheet in the next few weeks. These discharge instructions provide you with general information on caring for yourself after you leave the hospital. Your doctor may also give you specific instructions. While your treatment has been planned according to the most current medical practices available, unavoidable complications occasionally occur.   ACTIVITY You may resume your regular activity, but move at a slower pace for the next 24 hours.  Take frequent rest periods for the next 24 hours.  Walking will help get rid of the air and reduce the bloated feeling in your belly (abdomen).  No driving for 24 hours (because of the medicine (anesthesia) used during the test).   Do not sign any important legal documents or operate any machinery for 24 hours (because of the anesthesia used during the test).  NUTRITION Drink plenty of fluids.  You may resume your normal diet as instructed by your doctor.  Begin with a light meal and progress to your normal diet. Heavy or fried foods are harder to digest and may make you feel sick to your stomach (nauseated).  Avoid alcoholic beverages for 24 hours or as instructed.  MEDICATIONS You may resume your normal medications unless your doctor tells you otherwise.  WHAT YOU CAN EXPECT TODAY Some feelings of bloating in the abdomen.  Passage of more gas than usual.  Spotting of blood in your stool or on the toilet paper.  IF YOU HAD POLYPS REMOVED DURING THE COLONOSCOPY: No aspirin products for 7 days or as instructed.  No alcohol for 7 days or as instructed.  Eat a soft diet for the next 24 hours.  FINDING OUT THE RESULTS OF YOUR TEST Not all test results are available during your visit. If your test results are not back during the visit, make an appointment with your caregiver to find out the results. Do not assume everything is normal if you have not heard from your  caregiver or the medical facility. It is important for you to follow up on all of your test results.  SEEK IMMEDIATE MEDICAL ATTENTION IF: You have more than a spotting of blood in your stool.  Your belly is swollen (abdominal distention).  You are nauseated or vomiting.  You have a temperature over 101.  You have abdominal pain or discomfort that is severe or gets worse throughout the day.   Your colonoscopy revealed 9 polyp(s) which I removed successfully. Await pathology results, my office will contact you. I recommend repeating colonoscopy in 3 years for surveillance purposes.   Resume Xarelto tomorrow.   I hope you have a great rest of your week!  Hennie Duos. Marletta Lor, D.O. Gastroenterology and Hepatology [facility]

## 2023-11-08 ENCOUNTER — Encounter (HOSPITAL_COMMUNITY): Payer: Self-pay | Admitting: Internal Medicine

## 2023-11-08 LAB — SURGICAL PATHOLOGY

## 2023-11-10 ENCOUNTER — Ambulatory Visit (INDEPENDENT_AMBULATORY_CARE_PROVIDER_SITE_OTHER): Payer: Medicare HMO | Admitting: Family Medicine

## 2023-11-10 DIAGNOSIS — R7989 Other specified abnormal findings of blood chemistry: Secondary | ICD-10-CM

## 2023-11-10 DIAGNOSIS — E291 Testicular hypofunction: Secondary | ICD-10-CM | POA: Diagnosis not present

## 2023-11-10 NOTE — Progress Notes (Signed)
Patient given testosterone in right thigh and tolerated well.

## 2023-11-13 NOTE — Anesthesia Postprocedure Evaluation (Signed)
Anesthesia Post Note  Patient: Richard Crawford  Procedure(s) Performed: COLONOSCOPY WITH PROPOFOL POLYPECTOMY  Patient location during evaluation: Phase II Anesthesia Type: General Level of consciousness: awake Pain management: pain level controlled Vital Signs Assessment: post-procedure vital signs reviewed and stable Respiratory status: spontaneous breathing and respiratory function stable Cardiovascular status: blood pressure returned to baseline and stable Postop Assessment: no headache and no apparent nausea or vomiting Anesthetic complications: no Comments: Late entry   No notable events documented.   Last Vitals:  Vitals:   11/07/23 1145 11/07/23 1147  BP:  (!) 97/56  Pulse: 74   Resp: 19   Temp: 36.7 C   SpO2: 97%     Last Pain:  Vitals:   11/08/23 1059  TempSrc:   PainSc: 0-No pain                 Windell Norfolk

## 2023-11-21 ENCOUNTER — Other Ambulatory Visit: Payer: Self-pay | Admitting: Family Medicine

## 2023-11-21 DIAGNOSIS — E1141 Type 2 diabetes mellitus with diabetic mononeuropathy: Secondary | ICD-10-CM

## 2023-11-21 DIAGNOSIS — G8929 Other chronic pain: Secondary | ICD-10-CM

## 2023-11-24 ENCOUNTER — Ambulatory Visit (INDEPENDENT_AMBULATORY_CARE_PROVIDER_SITE_OTHER): Payer: Medicare HMO | Admitting: Family Medicine

## 2023-11-24 DIAGNOSIS — E291 Testicular hypofunction: Secondary | ICD-10-CM

## 2023-11-24 MED ORDER — TESTOSTERONE CYPIONATE 200 MG/ML IM SOLN
175.0000 mg | INTRAMUSCULAR | Status: AC
  Administered 2023-11-24 – 2024-11-08 (×26): 175 mg via INTRAMUSCULAR

## 2023-11-24 NOTE — Progress Notes (Signed)
Patient given testosterone injection today. Patient tolerated well. He is due for his levels to be drawn states that he spoke with Marcelino Duster and he is going to get it done at his next appointment. Patient is also due for his tox and contract for it at next appointment.

## 2023-11-30 ENCOUNTER — Telehealth (INDEPENDENT_AMBULATORY_CARE_PROVIDER_SITE_OTHER): Payer: Medicare HMO | Admitting: Family Medicine

## 2023-11-30 NOTE — Progress Notes (Signed)
Unable to connect with pt for video visit.

## 2023-12-08 ENCOUNTER — Ambulatory Visit (INDEPENDENT_AMBULATORY_CARE_PROVIDER_SITE_OTHER): Payer: Medicare HMO

## 2023-12-08 DIAGNOSIS — E291 Testicular hypofunction: Secondary | ICD-10-CM | POA: Diagnosis not present

## 2023-12-08 NOTE — Progress Notes (Signed)
 Patient is in office today for a nurse visit for Testosterone Injection. Patient injection was given in the right anterior thigh. Patient tolerated well.

## 2023-12-13 ENCOUNTER — Other Ambulatory Visit

## 2023-12-13 ENCOUNTER — Other Ambulatory Visit: Payer: Self-pay | Admitting: Family Medicine

## 2023-12-13 ENCOUNTER — Ambulatory Visit: Payer: Medicare HMO | Admitting: Family Medicine

## 2023-12-13 ENCOUNTER — Encounter: Payer: Self-pay | Admitting: Family Medicine

## 2023-12-13 DIAGNOSIS — E1169 Type 2 diabetes mellitus with other specified complication: Secondary | ICD-10-CM

## 2023-12-13 LAB — BAYER DCA HB A1C WAIVED: HB A1C (BAYER DCA - WAIVED): 6.8 % — ABNORMAL HIGH (ref 4.8–5.6)

## 2023-12-13 NOTE — Addendum Note (Signed)
 Addended by: Lorelee Cover C on: 12/13/2023 12:11 PM   Modules accepted: Orders

## 2023-12-14 ENCOUNTER — Ambulatory Visit (INDEPENDENT_AMBULATORY_CARE_PROVIDER_SITE_OTHER)

## 2023-12-14 ENCOUNTER — Encounter: Payer: Self-pay | Admitting: Family Medicine

## 2023-12-14 ENCOUNTER — Ambulatory Visit: Admitting: Family Medicine

## 2023-12-14 VITALS — BP 140/68 | HR 104 | Temp 97.2°F | Ht 75.0 in | Wt 363.2 lb

## 2023-12-14 DIAGNOSIS — E291 Testicular hypofunction: Secondary | ICD-10-CM

## 2023-12-14 DIAGNOSIS — G8929 Other chronic pain: Secondary | ICD-10-CM

## 2023-12-14 DIAGNOSIS — E785 Hyperlipidemia, unspecified: Secondary | ICD-10-CM

## 2023-12-14 DIAGNOSIS — Z85850 Personal history of malignant neoplasm of thyroid: Secondary | ICD-10-CM

## 2023-12-14 DIAGNOSIS — M5442 Lumbago with sciatica, left side: Secondary | ICD-10-CM | POA: Diagnosis not present

## 2023-12-14 DIAGNOSIS — E1159 Type 2 diabetes mellitus with other circulatory complications: Secondary | ICD-10-CM

## 2023-12-14 DIAGNOSIS — I5032 Chronic diastolic (congestive) heart failure: Secondary | ICD-10-CM | POA: Diagnosis not present

## 2023-12-14 DIAGNOSIS — I4821 Permanent atrial fibrillation: Secondary | ICD-10-CM

## 2023-12-14 DIAGNOSIS — F411 Generalized anxiety disorder: Secondary | ICD-10-CM

## 2023-12-14 DIAGNOSIS — M1A0410 Idiopathic chronic gout, right hand, without tophus (tophi): Secondary | ICD-10-CM | POA: Insufficient documentation

## 2023-12-14 DIAGNOSIS — E739 Lactose intolerance, unspecified: Secondary | ICD-10-CM

## 2023-12-14 DIAGNOSIS — M542 Cervicalgia: Secondary | ICD-10-CM | POA: Diagnosis not present

## 2023-12-14 DIAGNOSIS — I152 Hypertension secondary to endocrine disorders: Secondary | ICD-10-CM

## 2023-12-14 DIAGNOSIS — E1169 Type 2 diabetes mellitus with other specified complication: Secondary | ICD-10-CM

## 2023-12-14 DIAGNOSIS — H9193 Unspecified hearing loss, bilateral: Secondary | ICD-10-CM

## 2023-12-14 DIAGNOSIS — M25572 Pain in left ankle and joints of left foot: Secondary | ICD-10-CM

## 2023-12-14 DIAGNOSIS — E1141 Type 2 diabetes mellitus with diabetic mononeuropathy: Secondary | ICD-10-CM

## 2023-12-14 DIAGNOSIS — M5441 Lumbago with sciatica, right side: Secondary | ICD-10-CM

## 2023-12-14 LAB — CMP14+EGFR
ALT: 64 IU/L — ABNORMAL HIGH (ref 0–44)
AST: 47 IU/L — ABNORMAL HIGH (ref 0–40)
Albumin: 4.3 g/dL (ref 3.9–4.9)
Alkaline Phosphatase: 203 IU/L — ABNORMAL HIGH (ref 44–121)
BUN/Creatinine Ratio: 15 (ref 10–24)
BUN: 19 mg/dL (ref 8–27)
Bilirubin Total: 0.7 mg/dL (ref 0.0–1.2)
CO2: 23 mmol/L (ref 20–29)
Calcium: 9.6 mg/dL (ref 8.6–10.2)
Chloride: 104 mmol/L (ref 96–106)
Creatinine, Ser: 1.24 mg/dL (ref 0.76–1.27)
Globulin, Total: 3.4 g/dL (ref 1.5–4.5)
Glucose: 107 mg/dL — ABNORMAL HIGH (ref 70–99)
Potassium: 4.4 mmol/L (ref 3.5–5.2)
Sodium: 144 mmol/L (ref 134–144)
Total Protein: 7.7 g/dL (ref 6.0–8.5)
eGFR: 63 mL/min/{1.73_m2} (ref >59)

## 2023-12-14 MED ORDER — LANCET DEVICE MISC: 1.0000 | Freq: Three times a day (TID) | 0 refills | Status: AC

## 2023-12-14 MED ORDER — BLOOD GLUCOSE TEST VI STRP: 1.0000 | ORAL_STRIP | Freq: Three times a day (TID) | 0 refills | Status: DC

## 2023-12-14 MED ORDER — METOPROLOL TARTRATE 100 MG PO TABS: ORAL_TABLET | ORAL | 1 refills | Status: DC

## 2023-12-14 MED ORDER — RIVAROXABAN 20 MG PO TABS: 20.0000 mg | ORAL_TABLET | Freq: Every day | ORAL | 1 refills | Status: DC

## 2023-12-14 MED ORDER — LANCETS MISC. MISC: 1.0000 | Freq: Three times a day (TID) | 0 refills | Status: AC

## 2023-12-14 MED ORDER — ATORVASTATIN CALCIUM 20 MG PO TABS: 20.0000 mg | ORAL_TABLET | Freq: Every day | ORAL | 1 refills | Status: DC

## 2023-12-14 MED ORDER — TESTOSTERONE CYPIONATE 200 MG/ML IM SOLN: INTRAMUSCULAR | 0 refills | Status: DC

## 2023-12-14 MED ORDER — ALLOPURINOL 100 MG PO TABS: 100.0000 mg | ORAL_TABLET | Freq: Every day | ORAL | 1 refills | Status: DC

## 2023-12-14 MED ORDER — VALSARTAN 80 MG PO TABS: 80.0000 mg | ORAL_TABLET | Freq: Every day | ORAL | 1 refills | Status: DC

## 2023-12-14 MED ORDER — LEVOTHYROXINE SODIUM 200 MCG PO TABS: ORAL_TABLET | ORAL | 1 refills | Status: DC

## 2023-12-14 MED ORDER — BLOOD GLUCOSE MONITORING SUPPL DEVI: 1.0000 | Freq: Three times a day (TID) | 0 refills | Status: DC

## 2023-12-14 MED ORDER — DULOXETINE HCL 30 MG PO CPEP: 60.0000 mg | ORAL_CAPSULE | Freq: Every day | ORAL | 1 refills | Status: DC

## 2023-12-14 MED ORDER — LEVOTHYROXINE SODIUM 50 MCG PO TABS: ORAL_TABLET | ORAL | 1 refills | Status: DC

## 2023-12-14 NOTE — Patient Instructions (Addendum)

## 2023-12-14 NOTE — Progress Notes (Signed)
 Subjective:  Patient ID: Richard Crawford, male    DOB: Nov 26, 1955, 68 y.o.   MRN: [id number]  Patient Care Team: Sonny Masters, FNP as PCP - General (Family Medicine) Rollene Rotunda, MD as PCP - Cardiology (Cardiology)   Chief Complaint:  Diabetes (3 month follow up )   HPI: [name] is a 68 y.o. male presenting on 12/14/2023 for Diabetes (3 month follow up )   Discussed the use of AI scribe software for clinical note transcription with the patient, who gave verbal consent to proceed.  History of Present Illness   Ollie Esty "[name]" is a 68 year old male who presents for follow-up of multiple medical issues.  He is managing his diabetes with ongoing efforts, having recently received his A1c results. He has made dietary changes by eliminating cookies, candy, and soda, and continues to take his diabetes medications as prescribed.  He mentions elevated liver function tests, attributing this to increased Tylenol use for ankle and shoulder pain. He is unsure of other causes for the elevation.  His gout has been stable recently, although he previously experienced pain that was relieved by using hand exercisers. He takes allopurinol daily.  He mentions that his treadmill days are over and expresses discomfort with the upcoming cardiac tests.  He experiences left shoulder pain and numbness in his left hand, which he attributes to nerve impingement and chronic neck pain.  He experiences significant neck pain, especially when his head is tilted back, and has a history of neck injuries.  He reports severe stomach cramps and diarrhea, suspecting lactose intolerance as the cause. He has been avoiding milk and ice cream to manage symptoms.  He mentions hearing difficulties, with his partner suggesting he may be deaf, and is interested in undergoing a hearing test.          Relevant past medical, surgical, family, and social history reviewed and updated as indicated.  Allergies  and medications reviewed and updated. Data reviewed: Chart in Epic.   Past Medical History:  Diagnosis Date   Anxiety    Arthritis    left shoulder   Atrial fibrillation (HCC)    Cancer (HCC) 1989   thyroid   Chronic heart failure with preserved ejection fraction (HCC)    Diabetes mellitus without complication (HCC)    Dysrhythmia    Hemorrhoids    external   Hx of colonic polyps    Hyperlipidemia    Hypertension    Low testosterone 12/14/2020   Migraine    Papillary thyroid carcinoma (HCC)    Postoperative hypothyroidism    Primary osteoarthritis of left shoulder    Sleep apnea    Venous stasis dermatitis of both lower extremities     Past Surgical History:  Procedure Laterality Date   ANKLE FUSION     COLONOSCOPY WITH PROPOFOL N/A 11/07/2023   Procedure: COLONOSCOPY WITH PROPOFOL;  Surgeon: Lanelle Bal, DO;  [facility]: [facility];  Service: Endoscopy;  Laterality: N/A;  10:15 am, asa 3   KNEE ARTHROSCOPY     POLYPECTOMY  11/07/2023   Procedure: POLYPECTOMY;  Surgeon: Lanelle Bal, DO;  Location: [facility];  Service: Endoscopy;;   REPLACEMENT TOTAL KNEE Right    SHOULDER ARTHROSCOPY     TOTAL KNEE ARTHROPLASTY Left    TOTAL SHOULDER REPLACEMENT Right     Social History   Socioeconomic History   Marital status: Married    Spouse name: Not on file   Number  of children: Not on file   Years of education: Not on file   Highest education level: 12th grade  Occupational History   Occupation: retired  Tobacco Use   Smoking status: Never   Smokeless tobacco: Never  Substance and Sexual Activity   Alcohol use: Not Currently   Drug use: Never   Sexual activity: Not on file  Other Topics Concern   Not on file  Social History Narrative   Lives at home with wife - Originally from [place]; moved here so wife could be closer to her grandchildren (second wife)   Social Drivers of Corporate investment banker Strain: Low Risk  (01/24/2023)   Overall  Financial Resource Strain (CARDIA)    Difficulty of Paying Living Expenses: Not very hard  Food Insecurity: No Food Insecurity (01/24/2023)   Hunger Vital Sign    Worried About Running Out of Food in the Last Year: Never true    Ran Out of Food in the Last Year: Never true  Transportation Needs: No Transportation Needs (01/24/2023)   PRAPARE - Administrator, Civil Service (Medical): No    Lack of Transportation (Non-Medical): No  Physical Activity: Unknown (01/24/2023)   Exercise Vital Sign    Days of Exercise per Week: 0 days    Minutes of Exercise per Session: Not on file  Stress: No Stress Concern Present (01/24/2023)   [employer] of Occupational Health - Occupational Stress Questionnaire    Feeling of Stress : Only a little  Social Connections: Socially Isolated (01/24/2023)   Social Connection and Isolation Panel [NHANES]    Frequency of Communication with Friends and Family: Once a week    Frequency of Social Gatherings with Friends and Family: Once a week    Attends Religious Services: Never    Database administrator or Organizations: No    Attends Engineer, structural: Not on file    Marital Status: Married  Intimate Partner Violence: Unknown (01/14/2022)   Received from [employer], [facility]   HITS    Physically Hurt: Not on file    Insult or Talk Down To: Not on file    Threaten Physical Harm: Not on file    Scream or Curse: Not on file    Outpatient Encounter Medications as of 12/14/2023  Medication Sig   Blood Glucose Monitoring Suppl DEVI 1 each by Does not apply route in the morning, at noon, and at bedtime. May substitute to any manufacturer covered by patient's insurance.   dapagliflozin propanediol (FARXIGA) 10 MG TABS tablet TAKE 1 TABLET DAILY BEFORE BREAKFAST   gabapentin (NEURONTIN) 100 MG capsule TAKE 2 CAPSULES 3 TIMES A  DAY   Glucose Blood (BLOOD GLUCOSE TEST STRIPS) STRP 1 each by In Vitro route in the morning, at noon, and at  bedtime. May substitute to any manufacturer covered by patient's insurance.   Lancet Device MISC 1 each by Does not apply route in the morning, at noon, and at bedtime. May substitute to any manufacturer covered by patient's insurance.   Lancets Misc. MISC 1 each by Does not apply route in the morning, at noon, and at bedtime. May substitute to any manufacturer covered by patient's insurance.   Multiple Vitamin (MULTIVITAMIN) tablet Take 1 tablet by mouth 2 (two) times daily.   Prodigy Lancets 28G MISC Check BS daily and as needed Dx E11.9   Semaglutide (RYBELSUS) 14 MG TABS Take 1 tablet (14 mg total) by mouth daily.  sildenafil (VIAGRA) 100 MG tablet Take 0.5-1 tablets (50-100 mg total) by mouth daily as needed for erectile dysfunction.   SYRINGE-NEEDLE, DISP, 3 ML 20G X 1-1/2" 3 ML MISC 1 each by Does not apply route once a week.   [DISCONTINUED] allopurinol (ZYLOPRIM) 100 MG tablet TAKE 1 TABLET DAILY   [DISCONTINUED] atorvastatin (LIPITOR) 20 MG tablet TAKE 1 TABLET DAILY   [DISCONTINUED] DULoxetine (CYMBALTA) 30 MG capsule Take 2 capsules (60 mg total) by mouth daily.   [DISCONTINUED] glucose blood test strip Check BS daily and as needed Dx E11.9   [DISCONTINUED] levothyroxine (SYNTHROID) 200 MCG tablet TAKE ONE TABLET DAILY BEFORE BREAKFAST   [DISCONTINUED] levothyroxine (SYNTHROID) 50 MCG tablet TAKE 1 TABLET DAILY BEFORE BREAKFAST (TAKE IN ADDITIONTO TABLET FOR TOTAL DOSE )   [DISCONTINUED] metoprolol tartrate (LOPRESSOR) 100 MG tablet TAKE (2) TABLETS DAILY   [DISCONTINUED] rivaroxaban (XARELTO) 20 MG TABS tablet Take 1 tablet (20 mg total) by mouth daily with supper.   [DISCONTINUED] testosterone cypionate (DEPOTESTOSTERONE CYPIONATE) 200 MG/ML injection 150 MG TOTAL INTO THE MUSCLE EVERY 14 DAYS   [DISCONTINUED] valsartan (DIOVAN) 80 MG tablet Take 1 tablet (80 mg total) by mouth daily.   allopurinol (ZYLOPRIM) 100 MG tablet Take 1 tablet (100 mg total) by mouth daily.    atorvastatin (LIPITOR) 20 MG tablet Take 1 tablet (20 mg total) by mouth daily.   DULoxetine (CYMBALTA) 30 MG capsule Take 2 capsules (60 mg total) by mouth daily.   levothyroxine (SYNTHROID) 200 MCG tablet TAKE ONE TABLET DAILY BEFORE BREAKFAST   levothyroxine (SYNTHROID) 50 MCG tablet TAKE 1 TABLET DAILY BEFORE BREAKFAST (TAKE IN ADDITIONTO TABLET FOR TOTAL DOSE )   metoprolol tartrate (LOPRESSOR) 100 MG tablet TAKE (2) TABLETS DAILY   rivaroxaban (XARELTO) 20 MG TABS tablet Take 1 tablet (20 mg total) by mouth daily with supper.   testosterone cypionate (DEPOTESTOSTERONE CYPIONATE) 200 MG/ML injection 150 MG TOTAL INTO THE MUSCLE EVERY 14 DAYS   valsartan (DIOVAN) 80 MG tablet Take 1 tablet (80 mg total) by mouth daily.   Facility-Administered Encounter Medications as of 12/14/2023  Medication   testosterone cypionate (DEPOTESTOSTERONE CYPIONATE) injection 175 mg    Allergies  Allergen Reactions   Morphine Nausea Only   Morphine And Codeine Nausea And Vomiting and Nausea Only    Pertinent ROS per HPI, otherwise unremarkable      Objective:  BP (!) 140/68   Pulse (!) 104   Temp (!) 97.2 F (36.2 C)   Ht 6\' 3"  (1.905 m)   Wt (!) 363 lb 3.2 oz (164.7 kg)   SpO2 97%   BMI 45.40 kg/m    Wt Readings from Last 3 Encounters:  12/14/23 (!) 363 lb 3.2 oz (164.7 kg)  11/03/23 (!) 368 lb (166.9 kg)  10/31/23 (!) 363 lb 1.6 oz (164.7 kg)    Physical Exam Vitals and nursing note reviewed.  Constitutional:      General: He is not in acute distress.    Appearance: Normal appearance. He is morbidly obese. He is not ill-appearing, toxic-appearing or diaphoretic.  HENT:     Head: Normocephalic and atraumatic.     Right Ear: Tympanic membrane, ear canal and external ear normal. Decreased hearing noted.     Left Ear: Tympanic membrane, ear canal and external ear normal. Decreased hearing noted.     Nose: Nose normal.     Mouth/Throat:     Mouth: Mucous membranes are  moist.     Pharynx: Oropharynx is  clear.  Eyes:     Comments: Has on dark glasses  Cardiovascular:     Rate and Rhythm: Normal rate. Rhythm irregularly irregular.  Pulmonary:     Effort: Pulmonary effort is normal.     Breath sounds: Normal breath sounds.  Musculoskeletal:     Left shoulder: Decreased range of motion.     Cervical back: Neck supple. Decreased range of motion.     Right lower leg: Edema present.     Left lower leg: Edema present.  Skin:    General: Skin is warm and dry.     Capillary Refill: Capillary refill takes less than 2 seconds.  Neurological:     General: No focal deficit present.     Mental Status: He is alert and oriented to person, place, and time.  Psychiatric:        Mood and Affect: Mood normal.        Behavior: Behavior normal. Behavior is cooperative.        Thought Content: Thought content normal.        Judgment: Judgment normal.      Results for orders placed or performed in visit on 12/13/23  Bayer DCA Hb A1c Waived   Collection Time: 12/13/23 12:12 PM  Result Value Ref Range   HB A1C (BAYER DCA - WAIVED) 6.8 (H) 4.8 - 5.6 %  CMP14+EGFR   Collection Time: 12/13/23 12:27 PM  Result Value Ref Range   Glucose 107 (H) 70 - 99 mg/dL   BUN 19 8 - 27 mg/dL   Creatinine, Ser 2.95 0.76 - 1.27 mg/dL   eGFR 63 >62 ZH/YQM/5.78   BUN/Creatinine Ratio 15 10 - 24   Sodium 144 134 - 144 mmol/L   Potassium 4.4 3.5 - 5.2 mmol/L   Chloride 104 96 - 106 mmol/L   CO2 23 20 - 29 mmol/L   Calcium 9.6 8.6 - 10.2 mg/dL   Total Protein 7.7 6.0 - 8.5 g/dL   Albumin 4.3 3.9 - 4.9 g/dL   Globulin, Total 3.4 1.5 - 4.5 g/dL   Bilirubin Total 0.7 0.0 - 1.2 mg/dL   Alkaline Phosphatase 203 (H) 44 - 121 IU/L   AST 47 (H) 0 - 40 IU/L   ALT 64 (H) 0 - 44 IU/L       Pertinent labs & imaging results that were available during my care of the patient were reviewed by me and considered in my medical decision making.  Assessment & Plan:  Marcelis Wissner" was seen  today for diabetes.  Diagnoses and all orders for this visit:  Type 2 diabetes mellitus with other specified complication, without long-term current use of insulin (HCC) -     Microalbumin / creatinine urine ratio -     Blood Glucose Monitoring Suppl DEVI; 1 each by Does not apply route in the morning, at noon, and at bedtime. May substitute to any manufacturer covered by patient's insurance. -     Glucose Blood (BLOOD GLUCOSE TEST STRIPS) STRP; 1 each by In Vitro route in the morning, at noon, and at bedtime. May substitute to any manufacturer covered by patient's insurance. -     Lancet Device MISC; 1 each by Does not apply route in the morning, at noon, and at bedtime. May substitute to any manufacturer covered by patient's insurance. -     Lancets Misc. MISC; 1 each by Does not apply route in the morning, at noon, and at bedtime. May substitute to any manufacturer covered  by patient's insurance.  Hypertension associated with diabetes (HCC) -     metoprolol tartrate (LOPRESSOR) 100 MG tablet; TAKE (2) TABLETS DAILY -     valsartan (DIOVAN) 80 MG tablet; Take 1 tablet (80 mg total) by mouth daily.  Permanent atrial fibrillation (HCC) -     rivaroxaban (XARELTO) 20 MG TABS tablet; Take 1 tablet (20 mg total) by mouth daily with supper.  Chronic heart failure with preserved ejection fraction (HCC) -     valsartan (DIOVAN) 80 MG tablet; Take 1 tablet (80 mg total) by mouth daily.  Morbid obesity (HCC) -     Blood Glucose Monitoring Suppl DEVI; 1 each by Does not apply route in the morning, at noon, and at bedtime. May substitute to any manufacturer covered by patient's insurance. -     Glucose Blood (BLOOD GLUCOSE TEST STRIPS) STRP; 1 each by In Vitro route in the morning, at noon, and at bedtime. May substitute to any manufacturer covered by patient's insurance. -     Lancet Device MISC; 1 each by Does not apply route in the morning, at noon, and at bedtime. May substitute to any manufacturer  covered by patient's insurance. -     Lancets Misc. MISC; 1 each by Does not apply route in the morning, at noon, and at bedtime. May substitute to any manufacturer covered by patient's insurance.  Diabetic mononeuropathy associated with type 2 diabetes mellitus (HCC) -     DULoxetine (CYMBALTA) 30 MG capsule; Take 2 capsules (60 mg total) by mouth daily.  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  Hyperlipidemia associated with type 2 diabetes mellitus (HCC) -     atorvastatin (LIPITOR) 20 MG tablet; Take 1 tablet (20 mg total) by mouth daily.  Generalized anxiety disorder -     DULoxetine (CYMBALTA) 30 MG capsule; Take 2 capsules (60 mg total) by mouth daily.  Chronic bilateral low back pain with bilateral sciatica -     DULoxetine (CYMBALTA) 30 MG capsule; Take 2 capsules (60 mg total) by mouth daily.  Chronic pain of left ankle -     DULoxetine (CYMBALTA) 30 MG capsule; Take 2 capsules (60 mg total) by mouth daily.  History of papillary adenocarcinoma of thyroid -     levothyroxine (SYNTHROID) 200 MCG tablet; TAKE ONE TABLET DAILY BEFORE BREAKFAST -     levothyroxine (SYNTHROID) 50 MCG tablet; TAKE 1 TABLET DAILY BEFORE BREAKFAST (TAKE IN ADDITIONTO TABLET FOR TOTAL DOSE )  Testosterone deficiency in male -     testosterone cypionate (DEPOTESTOSTERONE CYPIONATE) 200 MG/ML injection; 150 MG TOTAL INTO THE MUSCLE EVERY 14 DAYS -     ToxASSURE Select 13 (MW), Urine  Chronic neck pain -     DG Cervical Spine Complete  Lactose intolerance Trial diet without lactose to see if beneficial.   Decreased hearing of both ears -     Ambulatory referral to Audiology     Assessment and Plan    Type 2 Diabetes Mellitus Diabetes is well-controlled with recent A1c within target range. Patient has made dietary changes by eliminating cookies, candy, and soda. Continues to  experience increased thirst. Discussed monitoring sodium content in electrolyte drinks due to hypertension. - Continue current diabetes medications - Send A1c results to all specialists including ophthalmologist - Submit order for OneTouch glucose monitoring kit to [facility]  Hypertension Managing blood  pressure but needs to monitor sodium intake in electrolyte drinks. Discussed risks of high sodium intake on blood pressure control. - Advise to monitor sodium intake in electrolyte drinks  Elevated Liver Enzymes Mild elevation in liver function tests, likely due to excess acetaminophen use for pain management. Discussed risks of continued high acetaminophen use and importance of limiting intake to avoid liver damage. - Advise to limit acetaminophen use  Gout Well-controlled with allopurinol. No recent flares but has experienced pain in the past. Discussed role of allopurinol in preventing flares and need to avoid aspartame. - Continue allopurinol daily - Recheck uric acid levels if another flare occurs - Advise to avoid aspartame  Obstructive Sleep Apnea Awaiting new CPAP machine, continues to use old CPAP. Discussed importance of timely CPAP replacement for effective management. - Follow up with CPAP provider to expedite delivery  Shoulder Pain and Nerve Impingement Left shoulder pain and numbness in left hand, likely due to nerve impingement. Surgery delayed due to high A1c. Discussed potential benefits of shoulder replacement surgery and need to reassess based on updated A1c. - Send updated A1c results to orthopedic surgeon - Consider shoulder replacement surgery based on updated A1c  Neck Pain Chronic neck pain exacerbated by recent medical procedures. Discussed potential benefits of imaging to assess extent of issue. - Order neck x-ray  Lactose Intolerance New onset of stomach cramps and diarrhea likely due to lactose intolerance. Discussed importance of avoiding  lactose-containing foods and potential need for lactose intolerance blood test. - Advise to avoid lactose-containing foods - Consider lactose intolerance blood test at next visit  Hearing Loss Reports hearing difficulties, confirmed by spouse. Discussed importance of early detection and management. - Perform in-office hearing test - Refer to audiologist if hearing test is abnormal  General Health Maintenance Up-to-date on colonoscopy with 13 polyps removed. Next colonoscopy scheduled in three years. - Continue routine health maintenance and screenings  Follow-up - Schedule follow-up appointment in three months - Order testosterone levels before 9:30 AM at next visit - Ensure all medications are sent to [facility].        Total time spent with patient today was 40 minutes.    Continue all other maintenance medications.  Follow up plan: Return in about 3 months (around 03/15/2024) for Chronic follow up with all labs, testosterone before 0930.   Continue healthy lifestyle choices, including diet (rich in fruits, vegetables, and lean proteins, and low in salt and simple carbohydrates) and exercise (at least 30 minutes of moderate physical activity daily).  Educational handout given for DM  The above assessment and management plan was discussed with the patient. The patient verbalized understanding of and has agreed to the management plan. Patient is aware to call the clinic if they develop any new symptoms or if symptoms persist or worsen. Patient is aware when to return to the clinic for a follow-up visit. Patient educated on when it is appropriate to go to the emergency department.   Kari Baars, FNP-C [facility] [phone]

## 2023-12-15 ENCOUNTER — Encounter: Payer: Self-pay | Admitting: Family Medicine

## 2023-12-22 ENCOUNTER — Ambulatory Visit (INDEPENDENT_AMBULATORY_CARE_PROVIDER_SITE_OTHER): Payer: Medicare HMO

## 2023-12-22 DIAGNOSIS — E291 Testicular hypofunction: Secondary | ICD-10-CM | POA: Diagnosis not present

## 2023-12-22 NOTE — Progress Notes (Signed)
 Patient is in office today for a nurse visit for Testosterone Injection. Patient injection was given in the left anterior thigh. Patient tolerated well

## 2024-01-02 ENCOUNTER — Encounter (HOSPITAL_COMMUNITY): Payer: Self-pay

## 2024-01-02 ENCOUNTER — Telehealth (HOSPITAL_COMMUNITY): Payer: Self-pay | Admitting: Emergency Medicine

## 2024-01-02 NOTE — Telephone Encounter (Signed)
 Reaching out to patient to offer assistance regarding upcoming cardiac imaging study; pt verbalizes understanding of appt date/time, parking situation and where to check in, pre-test NPO status and medications ordered, and verified current allergies; name and call back number provided for further questions should they arise Rockwell Alexandria RN Navigator Cardiac Imaging [facility] [phone] office [phone] cell

## 2024-01-03 ENCOUNTER — Encounter (HOSPITAL_COMMUNITY)
Admission: RE | Admit: 2024-01-03 | Discharge: 2024-01-03 | Disposition: A | Discharge status: Home / Self Care | Payer: Medicare HMO | Source: Ambulatory Visit | Attending: Cardiology | Admitting: Cardiology

## 2024-01-03 DIAGNOSIS — R0602 Shortness of breath: Secondary | ICD-10-CM | POA: Diagnosis not present

## 2024-01-03 LAB — NM PET CT CARDIAC PERFUSION MULTI W/ABSOLUTE BLOODFLOW
MBFR: 2.84
Rest MBF: 0.58 ml/g/min
Rest Nuclear Isotope Dose: 29 mCi
ST Depression (mm): 0 mm
Stress MBF: 1.65 ml/g/min
Stress Nuclear Isotope Dose: 28.7 mCi
TID: 1.4

## 2024-01-03 MED ORDER — REGADENOSON 0.4 MG/5ML IV SOLN
0.4000 mg | Freq: Once | INTRAVENOUS | Status: AC
  Administered 2024-01-03: 0.4 mg via INTRAVENOUS

## 2024-01-03 MED ORDER — REGADENOSON 0.4 MG/5ML IV SOLN
INTRAVENOUS | Status: AC
  Filled 2024-01-03: qty 5

## 2024-01-03 MED ORDER — RUBIDIUM RB82 GENERATOR (RUBYFILL)
28.7200 | PACK | Freq: Once | INTRAVENOUS | Status: AC
  Administered 2024-01-03: 28.72 via INTRAVENOUS

## 2024-01-03 MED ORDER — RUBIDIUM RB82 GENERATOR (RUBYFILL)
28.9700 | PACK | Freq: Once | INTRAVENOUS | Status: AC
  Administered 2024-01-03: 28.97 via INTRAVENOUS

## 2024-01-05 ENCOUNTER — Ambulatory Visit (INDEPENDENT_AMBULATORY_CARE_PROVIDER_SITE_OTHER): Payer: Medicare HMO

## 2024-01-05 ENCOUNTER — Other Ambulatory Visit: Payer: Self-pay | Admitting: Family Medicine

## 2024-01-05 DIAGNOSIS — G8929 Other chronic pain: Secondary | ICD-10-CM

## 2024-01-05 DIAGNOSIS — E291 Testicular hypofunction: Secondary | ICD-10-CM

## 2024-01-05 DIAGNOSIS — E1141 Type 2 diabetes mellitus with diabetic mononeuropathy: Secondary | ICD-10-CM

## 2024-01-05 NOTE — Progress Notes (Signed)
 Patient is in office today for a nurse visit for Testosterone Injection. Patient was given injection in the right thigh. Patient tolerated injection well.

## 2024-01-06 ENCOUNTER — Other Ambulatory Visit: Payer: Self-pay

## 2024-01-06 ENCOUNTER — Other Ambulatory Visit: Payer: Self-pay | Admitting: Family Medicine

## 2024-01-06 ENCOUNTER — Encounter: Payer: Self-pay | Admitting: Family Medicine

## 2024-01-06 DIAGNOSIS — E1159 Type 2 diabetes mellitus with other circulatory complications: Secondary | ICD-10-CM

## 2024-01-06 DIAGNOSIS — I5032 Chronic diastolic (congestive) heart failure: Secondary | ICD-10-CM

## 2024-01-06 MED ORDER — VALSARTAN 80 MG PO TABS: 80.0000 mg | ORAL_TABLET | Freq: Every day | ORAL | 0 refills | Status: DC

## 2024-01-10 ENCOUNTER — Other Ambulatory Visit: Payer: Self-pay | Admitting: Family Medicine

## 2024-01-10 DIAGNOSIS — E1169 Type 2 diabetes mellitus with other specified complication: Secondary | ICD-10-CM

## 2024-01-12 ENCOUNTER — Ambulatory Visit (HOSPITAL_COMMUNITY): Payer: Medicare HMO | Attending: Cardiology

## 2024-01-12 DIAGNOSIS — R0602 Shortness of breath: Secondary | ICD-10-CM | POA: Diagnosis present

## 2024-01-12 LAB — ECHOCARDIOGRAM COMPLETE: S' Lateral: 3.5 cm

## 2024-01-12 MED ORDER — PERFLUTREN LIPID MICROSPHERE
1.0000 mL | INTRAVENOUS | Status: AC | PRN
  Administered 2024-01-12: 2 mL via INTRAVENOUS

## 2024-01-19 ENCOUNTER — Other Ambulatory Visit: Payer: Self-pay | Admitting: Family Medicine

## 2024-01-19 ENCOUNTER — Ambulatory Visit (INDEPENDENT_AMBULATORY_CARE_PROVIDER_SITE_OTHER): Payer: Medicare HMO

## 2024-01-19 DIAGNOSIS — E1169 Type 2 diabetes mellitus with other specified complication: Secondary | ICD-10-CM

## 2024-01-19 DIAGNOSIS — E291 Testicular hypofunction: Secondary | ICD-10-CM | POA: Diagnosis not present

## 2024-01-19 NOTE — Progress Notes (Signed)
 Patient is in office today for a nurse visit for Testosterone Injection. Patient injection was given in the left anterior thigh. Patient tolerated injection well.

## 2024-01-23 ENCOUNTER — Telehealth: Payer: Self-pay

## 2024-01-23 ENCOUNTER — Ambulatory Visit: Attending: Family Medicine | Admitting: Audiology

## 2024-01-23 DIAGNOSIS — H903 Sensorineural hearing loss, bilateral: Secondary | ICD-10-CM | POA: Diagnosis present

## 2024-01-23 NOTE — Telephone Encounter (Signed)
 Received a call from patient calling for 4/3 echo results.Stated he needs clearance from Dr.Hochrein to have rt shoulder replacement.Advised I will send message to Dr.Hochrein.

## 2024-01-23 NOTE — Procedures (Signed)
  Outpatient Audiology and [facility] [address] [place], [place]  [address] [phone]  AUDIOLOGICAL  EVALUATION  NAME: Richard Crawford     DOB:   08-Feb-1956      MRN: [id number]                                                                                     DATE: 01/23/2024     REFERENT: Galvin Jules, FNP STATUS: Outpatient DIAGNOSIS: sensorineural hearing loss, bilateral    History: Richard Crawford was seen for an audiological evaluation due to concerns regarding his hearing sensitivity. Richard Crawford reports his wife has concerns regarding his hearing sensitivity. Richard Crawford denies otalgia, tinnitus, dizziness, and aural fullness. Richard Crawford reports a history of excessive cerumen. 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   Evaluation:  Otoscopy showed non-occluding cerumen bilaterally Tympanometry results were consistent with normal middle ear function, bilaterally.  Audiometric testing was completed using Conventional Audiometry techniques with insert earphones and TDH headphones. Test results are consistent with normal hearing sensitivity ([phone]) sloping to a  moderate to moderately-severe sensorineural hearing loss in both ears. Speech Recognition Thresholds were obtained at 15 dB HL in the right ear and at 15  dB HL in the left ear. Word Recognition Testing was completed at 70 dB HL and Richard Crawford scored 100% in the right ear and 88% in the left ear.    Results:  The test results were reviewed with Richard Crawford.  Test results are consistent with normal hearing sensitivity ([phone] Hz) sloping to a  moderate to moderately-severe sensorineural hearing loss in both ears. Richard Crawford may have hearing and communication difficulty in adverse listening environments. Richard Crawford will benefit from the use of good communication strategies. Hearing aids were briefly discussed and Richard Crawford in not a candidate at this time, audiological  monitoring was recommended.   Recommendations: 1.   Return in 1-2 years for audiological monitoring.    35 minutes spent testing and counseling on results.   If you have any questions please feel free to contact me at [phone].  Bert Britain Audiologist, Au.D., CCC-A 01/23/2024  3:04 PM  Cc: Galvin Jules, FNP

## 2024-01-24 ENCOUNTER — Other Ambulatory Visit: Payer: Self-pay | Admitting: Family Medicine

## 2024-01-24 DIAGNOSIS — I4821 Permanent atrial fibrillation: Secondary | ICD-10-CM

## 2024-01-24 NOTE — Telephone Encounter (Signed)
 Patient is following up requesting feedback regarding this matter.

## 2024-01-28 ENCOUNTER — Encounter: Payer: Self-pay | Admitting: Family Medicine

## 2024-01-30 ENCOUNTER — Encounter: Payer: Self-pay | Admitting: *Deleted

## 2024-02-02 ENCOUNTER — Ambulatory Visit (INDEPENDENT_AMBULATORY_CARE_PROVIDER_SITE_OTHER): Payer: Medicare HMO

## 2024-02-02 ENCOUNTER — Encounter: Payer: Self-pay | Admitting: Family Medicine

## 2024-02-02 DIAGNOSIS — E291 Testicular hypofunction: Secondary | ICD-10-CM

## 2024-02-02 NOTE — Progress Notes (Signed)
 Patient is in office today for a nurse visit for Testosterone  Injection. Patient injection was given in right leg. Patient tolerated injection well.

## 2024-02-02 NOTE — Telephone Encounter (Signed)
Left pt a message to call back and ask for the preop team . 

## 2024-02-03 NOTE — Telephone Encounter (Signed)
Left message for the patient to contact the office. 

## 2024-02-07 ENCOUNTER — Encounter: Payer: Self-pay | Admitting: Physician Assistant

## 2024-02-08 NOTE — Telephone Encounter (Signed)
 Spoke to patient and advised echo results and pet/ct results. No further questions at this time.

## 2024-02-09 NOTE — Telephone Encounter (Signed)
 I called to s/w surgery scheduler Lonn Roads for Dr. Brunilda Capra in regard to that I do not see that we ever received a clearance request; if so I do apologize that I do not see this in our system. I held for surgery scheduler though I know she is very busy as well; I had to release my call. I will send surgery scheduler a secure chat as well about clearance request.

## 2024-02-09 NOTE — Telephone Encounter (Signed)
 I s/w surgery scheduler [facility] in regard to clearance request. Maudine Sos tells me that the pt has changed his mind abut which shoulder he is going to have done. Per Maudine Sos, the pt needs an appt in the office with Dr. Brunilda Capra to discuss further; which she states the surgeon's office has called and left him message that he needs an appt with Dr. Brunilda Capra before surgery can be discussed. Kerri, states once the pt has appt and DR. Brunilda Capra has surgery plans in place for the pt then she will send a clearance request to our office fax [phone] attn: preop team.  I thanked Maudine Sos for her help in this matter.   I will update the preop APP as well as send FYI notes to [facility]. Surgery scheduler for Dr. Brunilda Capra. This will be removed from the preop call back pool until the request does come.

## 2024-02-10 ENCOUNTER — Encounter: Payer: Self-pay | Admitting: Family Medicine

## 2024-02-16 ENCOUNTER — Ambulatory Visit (INDEPENDENT_AMBULATORY_CARE_PROVIDER_SITE_OTHER): Payer: Medicare HMO

## 2024-02-16 DIAGNOSIS — E291 Testicular hypofunction: Secondary | ICD-10-CM | POA: Diagnosis not present

## 2024-02-16 NOTE — Progress Notes (Signed)
 Patient is in office today for a nurse visit for Testosterone  Injection. Patient injection was given in left thigh. Patient tolerated injection well.

## 2024-02-19 ENCOUNTER — Other Ambulatory Visit: Payer: Self-pay | Admitting: Family Medicine

## 2024-02-19 DIAGNOSIS — G8929 Other chronic pain: Secondary | ICD-10-CM

## 2024-02-19 DIAGNOSIS — E1141 Type 2 diabetes mellitus with diabetic mononeuropathy: Secondary | ICD-10-CM

## 2024-03-01 ENCOUNTER — Ambulatory Visit (INDEPENDENT_AMBULATORY_CARE_PROVIDER_SITE_OTHER): Payer: Medicare HMO

## 2024-03-01 DIAGNOSIS — E291 Testicular hypofunction: Secondary | ICD-10-CM

## 2024-03-01 NOTE — Progress Notes (Signed)
 Patient is in office today for a nurse visit for Testosterone  Injection. Patient injection was given in the right thigh. Patient tolerated injection well.

## 2024-03-03 ENCOUNTER — Other Ambulatory Visit: Payer: Self-pay | Admitting: Family Medicine

## 2024-03-03 DIAGNOSIS — E1169 Type 2 diabetes mellitus with other specified complication: Secondary | ICD-10-CM

## 2024-03-12 ENCOUNTER — Other Ambulatory Visit: Payer: Self-pay | Admitting: Family Medicine

## 2024-03-12 DIAGNOSIS — E1169 Type 2 diabetes mellitus with other specified complication: Secondary | ICD-10-CM

## 2024-03-15 ENCOUNTER — Ambulatory Visit: Admitting: Family Medicine

## 2024-03-15 ENCOUNTER — Ambulatory Visit: Payer: Medicare HMO

## 2024-03-15 ENCOUNTER — Ambulatory Visit: Payer: Self-pay | Admitting: Family Medicine

## 2024-03-15 ENCOUNTER — Encounter: Payer: Self-pay | Admitting: Family Medicine

## 2024-03-15 VITALS — BP 116/71 | HR 63 | Temp 98.1°F | Ht 75.0 in | Wt 358.2 lb

## 2024-03-15 DIAGNOSIS — Z1159 Encounter for screening for other viral diseases: Secondary | ICD-10-CM

## 2024-03-15 DIAGNOSIS — E785 Hyperlipidemia, unspecified: Secondary | ICD-10-CM

## 2024-03-15 DIAGNOSIS — E1159 Type 2 diabetes mellitus with other circulatory complications: Secondary | ICD-10-CM | POA: Diagnosis not present

## 2024-03-15 DIAGNOSIS — E1169 Type 2 diabetes mellitus with other specified complication: Secondary | ICD-10-CM | POA: Diagnosis not present

## 2024-03-15 DIAGNOSIS — I5032 Chronic diastolic (congestive) heart failure: Secondary | ICD-10-CM | POA: Diagnosis not present

## 2024-03-15 DIAGNOSIS — H35033 Hypertensive retinopathy, bilateral: Secondary | ICD-10-CM

## 2024-03-15 DIAGNOSIS — N521 Erectile dysfunction due to diseases classified elsewhere: Secondary | ICD-10-CM

## 2024-03-15 DIAGNOSIS — I4821 Permanent atrial fibrillation: Secondary | ICD-10-CM

## 2024-03-15 DIAGNOSIS — E291 Testicular hypofunction: Secondary | ICD-10-CM

## 2024-03-15 DIAGNOSIS — H25813 Combined forms of age-related cataract, bilateral: Secondary | ICD-10-CM

## 2024-03-15 DIAGNOSIS — I152 Hypertension secondary to endocrine disorders: Secondary | ICD-10-CM

## 2024-03-15 DIAGNOSIS — E1141 Type 2 diabetes mellitus with diabetic mononeuropathy: Secondary | ICD-10-CM

## 2024-03-15 LAB — LIPID PANEL

## 2024-03-15 LAB — BAYER DCA HB A1C WAIVED: HB A1C (BAYER DCA - WAIVED): 7.2 % — ABNORMAL HIGH (ref 4.8–5.6)

## 2024-03-15 MED ORDER — DAPAGLIFLOZIN PROPANEDIOL 10 MG PO TABS: 10.0000 mg | ORAL_TABLET | Freq: Every day | ORAL | 3 refills | Status: DC

## 2024-03-15 MED ORDER — VALSARTAN 80 MG PO TABS: 80.0000 mg | ORAL_TABLET | Freq: Every day | ORAL | 3 refills | Status: DC

## 2024-03-15 NOTE — Patient Instructions (Addendum)

## 2024-03-15 NOTE — Progress Notes (Signed)
 Subjective:  Patient ID: Richard Crawford, male    DOB: [birth year], 68 y.o.   MRN: [id number]  Patient Care Team: Galvin Jules, FNP as PCP - General (Family Medicine) Eilleen Grates, MD as PCP - Cardiology (Cardiology)   Chief Complaint:  Diabetes (3 month follow up )   HPI: Richard Crawford is a 68 y.o. male presenting on 03/15/2024 for Diabetes (3 month follow up )  Richard Crawford "Raenette Bumps" is a 68 year old male who presents for a follow-up visit.  His blood sugars are well-controlled, with a reading of 112 mg/dL this morning. He is taking Farxiga  for kidney protection related to his diabetes and is running low on this medication. His A1c has increased to 7.2 from 6.8, which he attributes to drinking soda due to a lack of water  availability. He is considering alternative devices for glucose monitoring due to difficulty obtaining blood from his fingers.  He is on a regimen of 250 mcg of levothyroxine  daily for hypothyroidism and confirms he is taking the brand name medication.  He is taking gabapentin  and duloxetine , two capsules each, once daily. He recently visited a shoulder specialist due to severe pain in his right shoulder, which he uses more frequently because his left shoulder is in poor condition. An x-ray revealed wear and tear, and he received a cortisone shot that provided significant relief until he engaged in strenuous activities like starting lawn equipment. He experiences occasional severe pain in his right forearm, which he associates with nerve issues from his shoulder.  He underwent cataract surgery and had a retinal exam. He experienced some inflammation in his eye, treated with pink drops for a week, and reports improved vision. He has a prescription for glasses but has not yet filled it.  He has not seen his cardiologist recently but completed a PET scan. He is in the process of obtaining surgical clearance for shoulder surgery, which involves coordination between his  cardiologist and shoulder specialist.  No new or concerning symptoms, including chest pain, but notes occasional stabbing pain in his right forearm.          Relevant past medical, surgical, family, and social history reviewed and updated as indicated.  Allergies and medications reviewed and updated. Data reviewed: Chart in Epic.   Past Medical History:  Diagnosis Date   Anxiety    Arthritis    left shoulder   Atrial fibrillation (HCC)    Cancer (HCC) 1989   thyroid    Chronic heart failure with preserved ejection fraction (HCC)    Diabetes mellitus without complication (HCC)    Dysrhythmia    Hemorrhoids    external   Hx of colonic polyps    Hyperlipidemia    Hypertension    Low testosterone  12/14/2020   Migraine    Papillary thyroid  carcinoma (HCC)    Postoperative hypothyroidism    Primary osteoarthritis of left shoulder    Sleep apnea    Venous stasis dermatitis of both lower extremities     Past Surgical History:  Procedure Laterality Date   ANKLE FUSION     COLONOSCOPY WITH PROPOFOL  N/A 11/07/2023   Procedure: COLONOSCOPY WITH PROPOFOL ;  Surgeon: Vinetta Greening, DO;  Location: [facility];  Service: Endoscopy;  Laterality: N/A;  10:15 am, asa 3   KNEE ARTHROSCOPY     POLYPECTOMY  11/07/2023   Procedure: POLYPECTOMY;  Surgeon: Vinetta Greening, DO;  Location: [facility];  Service: Endoscopy;;   REPLACEMENT TOTAL KNEE  Right    SHOULDER ARTHROSCOPY     TOTAL KNEE ARTHROPLASTY Left    TOTAL SHOULDER REPLACEMENT Right     Social History   Socioeconomic History   Marital status: Married    Spouse name: Not on file   Number of children: Not on file   Years of education: Not on file   Highest education level: 12th grade  Occupational History   Occupation: retired  Tobacco Use   Smoking status: Never   Smokeless tobacco: Never  Substance and Sexual Activity   Alcohol use: Not Currently   Drug use: Never   Sexual activity: Not on file  Other  Topics Concern   Not on file  Social History Narrative   Lives at home with wife - Originally from [place] ; moved here so wife could be closer to her grandchildren (second wife)   Social Drivers of Corporate investment banker Strain: Low Risk  (01/24/2023)   Overall Financial Resource Strain (CARDIA)    Difficulty of Paying Living Expenses: Not very hard  Food Insecurity: No Food Insecurity (01/24/2023)   Hunger Vital Sign    Worried About Running Out of Food in the Last Year: Never true    Ran Out of Food in the Last Year: Never true  Transportation Needs: No Transportation Needs (01/24/2023)   PRAPARE - Administrator, Civil Service (Medical): No    Lack of Transportation (Non-Medical): No  Physical Activity: Unknown (01/24/2023)   Exercise Vital Sign    Days of Exercise per Week: 0 days    Minutes of Exercise per Session: Not on file  Stress: No Stress Concern Present (01/24/2023)   [employer] of Occupational Health - Occupational Stress Questionnaire    Feeling of Stress : Only a little  Social Connections: Socially Isolated (01/24/2023)   Social Connection and Isolation Panel [NHANES]    Frequency of Communication with Friends and Family: Once a week    Frequency of Social Gatherings with Friends and Family: Once a week    Attends Religious Services: Never    Database administrator or Organizations: No    Attends Engineer, structural: Not on file    Marital Status: Married  Intimate Partner Violence: Unknown (01/14/2022)   Received from [employer], [facility]   HITS    Physically Hurt: Not on file    Insult or Talk Down To: Not on file    Threaten Physical Harm: Not on file    Scream or Curse: Not on file    Outpatient Encounter Medications as of 03/15/2024  Medication Sig   allopurinol  (ZYLOPRIM ) 100 MG tablet Take 1 tablet (100 mg total) by mouth daily.   atorvastatin  (LIPITOR) 20 MG tablet Take 1 tablet (20 mg total) by mouth daily.    Blood Glucose Monitoring Suppl (ONETOUCH VERIO FLEX SYSTEM) w/Device KIT USE IN THE MORNING, AT     NOON, AND AT BEDTIME.   DULoxetine  (CYMBALTA ) 30 MG capsule Take 2 capsules (60 mg total) by mouth daily.   gabapentin  (NEURONTIN ) 100 MG capsule TAKE 2 CAPSULES 3 TIMES A  DAY   glucose blood (ONETOUCH VERIO) test strip Test BS in the morning, at noon and at bedtime Dx E11.41   levothyroxine  (SYNTHROID ) 200 MCG tablet TAKE ONE TABLET DAILY BEFORE BREAKFAST   levothyroxine  (SYNTHROID ) 50 MCG tablet TAKE 1 TABLET DAILY BEFORE BREAKFAST (TAKE IN ADDITIONTO 200MCG TABLET FOR TOTAL DOSE )   metoprolol  tartrate (LOPRESSOR ) 100 MG tablet  TAKE (2) TABLETS DAILY   Multiple Vitamin (MULTIVITAMIN) tablet Take 1 tablet by mouth 2 (two) times daily.   Prodigy Lancets 28G MISC Check BS daily and as needed Dx E11.9   Semaglutide  (RYBELSUS ) 14 MG TABS TAKE 1 TABLET DAILY   sildenafil  (VIAGRA ) 100 MG tablet Take 0.5-1 tablets (50-100 mg total) by mouth daily as needed for erectile dysfunction.   SYRINGE-NEEDLE, DISP, 3 ML 20G X 1-1/2" 3 ML MISC 1 each by Does not apply route once a week.   testosterone  cypionate (DEPOTESTOSTERONE CYPIONATE) 200 MG/ML injection 150 MG TOTAL INTO THE MUSCLE EVERY 14 DAYS   XARELTO  20 MG TABS tablet TAKE ONE TABLET DAILY WITH SUPPER   [DISCONTINUED] FARXIGA  10 MG TABS tablet TAKE 1 TABLET DAILY BEFORE BREAKFAST   dapagliflozin  propanediol (FARXIGA ) 10 MG TABS tablet Take 1 tablet (10 mg total) by mouth daily before breakfast.   valsartan  (DIOVAN ) 80 MG tablet Take 1 tablet (80 mg total) by mouth daily.   [DISCONTINUED] dapagliflozin  propanediol (FARXIGA ) 10 MG TABS tablet Take 1 tablet (10 mg total) by mouth daily before breakfast.   [DISCONTINUED] valsartan  (DIOVAN ) 80 MG tablet Take 1 tablet (80 mg total) by mouth daily.   [DISCONTINUED] valsartan  (DIOVAN ) 80 MG tablet Take 1 tablet (80 mg total) by mouth daily.   Facility-Administered Encounter Medications as of 03/15/2024   Medication   testosterone  cypionate (DEPOTESTOSTERONE CYPIONATE) injection 175 mg    Allergies  Allergen Reactions   Morphine Nausea Only   Morphine And Codeine Nausea And Vomiting and Nausea Only    Pertinent ROS per HPI, otherwise unremarkable      Objective:  BP 116/71   Pulse 63   Temp 98.1 F (36.7 C)   Ht 6\' 3"  (1.905 m)   Wt (!) 358 lb 3.2 oz (162.5 kg)   SpO2 97%   BMI 44.77 kg/m    Wt Readings from Last 3 Encounters:  03/15/24 (!) 358 lb 3.2 oz (162.5 kg)  12/14/23 (!) 363 lb 3.2 oz (164.7 kg)  11/03/23 (!) 368 lb (166.9 kg)    Physical Exam Vitals and nursing note reviewed.  Constitutional:      Appearance: Normal appearance. He is morbidly obese.  HENT:     Head: Normocephalic and atraumatic.     Nose: Nose normal.     Mouth/Throat:     Mouth: Mucous membranes are moist.  Eyes:     Conjunctiva/sclera: Conjunctivae normal.     Pupils: Pupils are equal, round, and reactive to light.  Cardiovascular:     Rate and Rhythm: Normal rate. Rhythm irregularly irregular.     Heart sounds: Normal heart sounds.  Pulmonary:     Effort: Pulmonary effort is normal.     Breath sounds: Normal breath sounds.  Musculoskeletal:     Right shoulder: Decreased range of motion.     Left shoulder: Decreased range of motion.     Right upper arm: Normal.     Left upper arm: Normal.     Cervical back: Neck supple. Decreased range of motion.     Thoracic back: Normal.     Right lower leg: Edema present.     Left lower leg: Edema present.  Skin:    General: Skin is warm and dry.     Capillary Refill: Capillary refill takes less than 2 seconds.  Neurological:     General: No focal deficit present.     Mental Status: He is alert and oriented to person, place, and  time.     Gait: Gait abnormal (antalgic, using cane).  Psychiatric:        Mood and Affect: Mood normal.        Behavior: Behavior normal.        Thought Content: Thought content normal.        Judgment:  Judgment normal.       Results for orders placed or performed in visit on 01/12/24  ECHOCARDIOGRAM COMPLETE   Collection Time: 01/12/24 11:07 AM  Result Value Ref Range   S' Lateral 3.50 cm   Est EF 55 - 60%        Pertinent labs & imaging results that were available during my care of the patient were reviewed by me and considered in my medical decision making.  Assessment & Plan:  Sricharan Lacomb" was seen today for diabetes.  Diagnoses and all orders for this visit:  Type 2 diabetes mellitus with other specified complication, without long-term current use of insulin  (HCC) -     Bayer DCA Hb A1c Waived -     CBC with Differential/Platelet -     CMP14+EGFR -     Lipid panel -     Thyroid  Panel With TSH -     Microalbumin / creatinine urine ratio -     Discontinue: dapagliflozin  propanediol (FARXIGA ) 10 MG TABS tablet; Take 1 tablet (10 mg total) by mouth daily before breakfast. -     dapagliflozin  propanediol (FARXIGA ) 10 MG TABS tablet; Take 1 tablet (10 mg total) by mouth daily before breakfast.  Hypertension associated with diabetes (HCC) -     CBC with Differential/Platelet -     CMP14+EGFR -     Thyroid  Panel With TSH -     Discontinue: valsartan  (DIOVAN ) 80 MG tablet; Take 1 tablet (80 mg total) by mouth daily. -     Microalbumin / creatinine urine ratio -     valsartan  (DIOVAN ) 80 MG tablet; Take 1 tablet (80 mg total) by mouth daily.  Chronic heart failure with preserved ejection fraction (HCC) -     CBC with Differential/Platelet -     CMP14+EGFR -     Discontinue: valsartan  (DIOVAN ) 80 MG tablet; Take 1 tablet (80 mg total) by mouth daily. -     valsartan  (DIOVAN ) 80 MG tablet; Take 1 tablet (80 mg total) by mouth daily.  Hyperlipidemia associated with type 2 diabetes mellitus (HCC) -     Lipid panel -     Thyroid  Panel With TSH -     PSA, total and free -     Microalbumin / creatinine urine ratio  Hypertensive retinopathy of both eyes -     CBC with  Differential/Platelet  Morbid obesity (HCC) -     Bayer DCA Hb A1c Waived -     CBC with Differential/Platelet -     CMP14+EGFR -     Lipid panel -     Thyroid  Panel With TSH  Testosterone  deficiency in male -     Testosterone ,Free and Total -     PSA, total and free  Erectile dysfunction due to diabetes mellitus (HCC) -     Testosterone ,Free and Total -     PSA, total and free  Combined forms of age-related cataract of both eyes -     CBC with Differential/Platelet  Diabetic mononeuropathy associated with type 2 diabetes mellitus (HCC) -     Bayer DCA Hb A1c Waived -     CBC  with Differential/Platelet -     CMP14+EGFR  Permanent atrial fibrillation (HCC) -     CBC with Differential/Platelet -     CMP14+EGFR -     Lipid panel -     Thyroid  Panel With TSH  Need for hepatitis C screening test -     Hepatitis C Antibody       Diabetes Mellitus Type 2 Blood glucose levels are well-controlled at 112 mg/dL, but GNF6O has increased to 7.2% from 6.8%, likely due to increased soda consumption after running out of preferred water . Farxiga  is used for renal protection related to diabetes. - Refill Farxiga  prescription for continuous renal protection. - Advise reduction of soda intake to lower HbA1c levels.  Chronic Shoulder Pain Chronic pain in the right shoulder exacerbated by physical activity. Recent cortisone injection provided significant relief. Pain likely due to degenerative changes from previous shoulder issues. - Advise contacting the shoulder specialist if pain recurs or worsens. - Consider further evaluation or treatment if shoulder pain persists.  Nerve Pain Intermittent stabbing pain in the right forearm, likely related to nerve issues stemming from shoulder problems. Gabapentin  is used to manage nerve pain.  Hypothyroidism Currently on levothyroxine  250 mcg daily, taking the brand name medication. Reports adherence to the regimen.  Erectile  Dysfunction Inadequate response to current treatment with Viagra . Interested in exploring new treatment options, including a chewable medication with a flame logo. - Research new erectile dysfunction treatments and consult with Dr. Claretta Croft, a urologist, for recommendations.  General Health Maintenance Post-cataract surgery with a recent retinal exam. Actively losing weight and aware of the need to maintain a healthy lifestyle. - Encourage continued weight loss and healthy lifestyle choices. - Ensure regular eye exams post-cataract surgery.  Follow-up Due for a testosterone  level check, which was drawn today. Also due for a testosterone  injection. - Administer testosterone  injection today. - Follow up on testosterone  level results once available.           Continue all other maintenance medications.  Follow up plan: Return in about 3 months (around 06/15/2024) for DM.   Continue healthy lifestyle choices, including diet (rich in fruits, vegetables, and lean proteins, and low in salt and simple carbohydrates) and exercise (at least 30 minutes of moderate physical activity daily).  Educational handout given for DM  The above assessment and management plan was discussed with the patient. The patient verbalized understanding of and has agreed to the management plan. Patient is aware to call the clinic if they develop any new symptoms or if symptoms persist or worsen. Patient is aware when to return to the clinic for a follow-up visit. Patient educated on when it is appropriate to go to the emergency department.   Kattie Parrot, FNP-C [facility] [phone]

## 2024-03-16 ENCOUNTER — Other Ambulatory Visit (HOSPITAL_COMMUNITY): Payer: Self-pay

## 2024-03-16 ENCOUNTER — Telehealth: Payer: Self-pay

## 2024-03-16 NOTE — Telephone Encounter (Signed)
 Pharmacy Patient Advocate Encounter   Received notification from CoverMyMeds that prior authorization for Dapagliflozin  Propanediol 10MG  tabletsis required/requested.   Insurance verification completed.   The patient is insured through [facility] .   Per test claim: Refill too soon. PA is not needed at this time. Medication was filled 03/15/2024. Next eligible fill date is 05/22/2024.

## 2024-03-27 LAB — CBC WITH DIFFERENTIAL/PLATELET
Basophils Absolute: 0.1 10*3/uL (ref 0.0–0.2)
Basos: 1 %
EOS (ABSOLUTE): 0.1 10*3/uL (ref 0.0–0.4)
Eos: 1 %
Hematocrit: 54.4 % — ABNORMAL HIGH (ref 37.5–51.0)
Hemoglobin: 17.2 g/dL (ref 13.0–17.7)
Immature Grans (Abs): 0 10*3/uL (ref 0.0–0.1)
Immature Granulocytes: 0 %
Lymphocytes Absolute: 3 10*3/uL (ref 0.7–3.1)
Lymphs: 30 %
MCH: 27.7 pg (ref 26.6–33.0)
MCHC: 31.6 g/dL (ref 31.5–35.7)
MCV: 88 fL (ref 79–97)
Monocytes Absolute: 1.6 10*3/uL — ABNORMAL HIGH (ref 0.1–0.9)
Monocytes: 16 %
Neutrophils Absolute: 5.1 10*3/uL (ref 1.4–7.0)
Neutrophils: 52 %
Platelets: 207 10*3/uL (ref 150–450)
RBC: 6.22 x10E6/uL — ABNORMAL HIGH (ref 4.14–5.80)
RDW: 14.2 % (ref 11.6–15.4)
WBC: 9.9 10*3/uL (ref 3.4–10.8)

## 2024-03-27 LAB — CMP14+EGFR
ALT: 42 IU/L (ref 0–44)
AST: 27 IU/L (ref 0–40)
Albumin: 4.5 g/dL (ref 3.9–4.9)
Alkaline Phosphatase: 178 IU/L — ABNORMAL HIGH (ref 44–121)
BUN/Creatinine Ratio: 16 (ref 10–24)
BUN: 21 mg/dL (ref 8–27)
Bilirubin Total: 0.9 mg/dL (ref 0.0–1.2)
CO2: 22 mmol/L (ref 20–29)
Calcium: 9.6 mg/dL (ref 8.6–10.2)
Chloride: 101 mmol/L (ref 96–106)
Creatinine, Ser: 1.31 mg/dL — ABNORMAL HIGH (ref 0.76–1.27)
Globulin, Total: 3.2 g/dL (ref 1.5–4.5)
Glucose: 122 mg/dL — ABNORMAL HIGH (ref 70–99)
Potassium: 5.1 mmol/L (ref 3.5–5.2)
Sodium: 142 mmol/L (ref 134–144)
Total Protein: 7.7 g/dL (ref 6.0–8.5)
eGFR: 59 mL/min/{1.73_m2} — ABNORMAL LOW (ref >59)

## 2024-03-27 LAB — LIPID PANEL
Chol/HDL Ratio: 2.8 ratio (ref 0.0–5.0)
Cholesterol, Total: 91 mg/dL — ABNORMAL LOW (ref 100–199)
HDL: 33 mg/dL — ABNORMAL LOW (ref >39)
LDL Chol Calc (NIH): 41 mg/dL (ref 0–99)
Triglycerides: 81 mg/dL (ref 0–149)
VLDL Cholesterol Cal: 17 mg/dL (ref 5–40)

## 2024-03-27 LAB — THYROID PANEL WITH TSH
Free Thyroxine Index: 1.9 (ref 1.2–4.9)
T3 Uptake Ratio: 28 % (ref 24–39)
T4, Total: 6.8 ug/dL (ref 4.5–12.0)
TSH: 4.01 u[IU]/mL (ref 0.450–4.500)

## 2024-03-27 LAB — TESTOSTERONE,FREE AND TOTAL
Testosterone, Free: 1.9 pg/mL — ABNORMAL LOW (ref 6.6–18.1)
Testosterone: 368 ng/dL (ref 264–916)

## 2024-03-27 LAB — PSA, TOTAL AND FREE
PSA, Free Pct: 52.5 %
PSA, Free: 0.21 ng/mL
Prostate Specific Ag, Serum: 0.4 ng/mL (ref 0.0–4.0)

## 2024-03-27 LAB — HEPATITIS C ANTIBODY: Hep C Virus Ab: NONREACTIVE

## 2024-03-29 ENCOUNTER — Ambulatory Visit (INDEPENDENT_AMBULATORY_CARE_PROVIDER_SITE_OTHER): Payer: Medicare HMO | Admitting: *Deleted

## 2024-03-29 DIAGNOSIS — E291 Testicular hypofunction: Secondary | ICD-10-CM

## 2024-03-29 NOTE — Progress Notes (Signed)
 Testosterone  injection given right thigh intramuscular. Patient supplied. Patient tolerated well.

## 2024-03-31 ENCOUNTER — Other Ambulatory Visit: Payer: Self-pay | Admitting: Family Medicine

## 2024-03-31 DIAGNOSIS — E1169 Type 2 diabetes mellitus with other specified complication: Secondary | ICD-10-CM

## 2024-04-04 ENCOUNTER — Other Ambulatory Visit: Payer: Self-pay | Admitting: Family Medicine

## 2024-04-04 DIAGNOSIS — E1141 Type 2 diabetes mellitus with diabetic mononeuropathy: Secondary | ICD-10-CM

## 2024-04-04 DIAGNOSIS — G8929 Other chronic pain: Secondary | ICD-10-CM

## 2024-04-12 ENCOUNTER — Ambulatory Visit: Payer: Medicare HMO

## 2024-04-12 DIAGNOSIS — E291 Testicular hypofunction: Secondary | ICD-10-CM | POA: Diagnosis not present

## 2024-04-12 NOTE — Progress Notes (Signed)
 Patient is in office today for a nurse visit for Testosterone  Injection. Patient injection was placed in left thigh per patient request. Patient tolerated injection well.

## 2024-04-14 IMAGING — DX DG LUMBAR SPINE 2-3V
3 series · 3 of 3 positions shown · non-contrast
Comparison: None Available.

CLINICAL DATA: Low back pain.

EXAM:
LUMBAR SPINE - 2-3 VIEW

[l-spine ap]
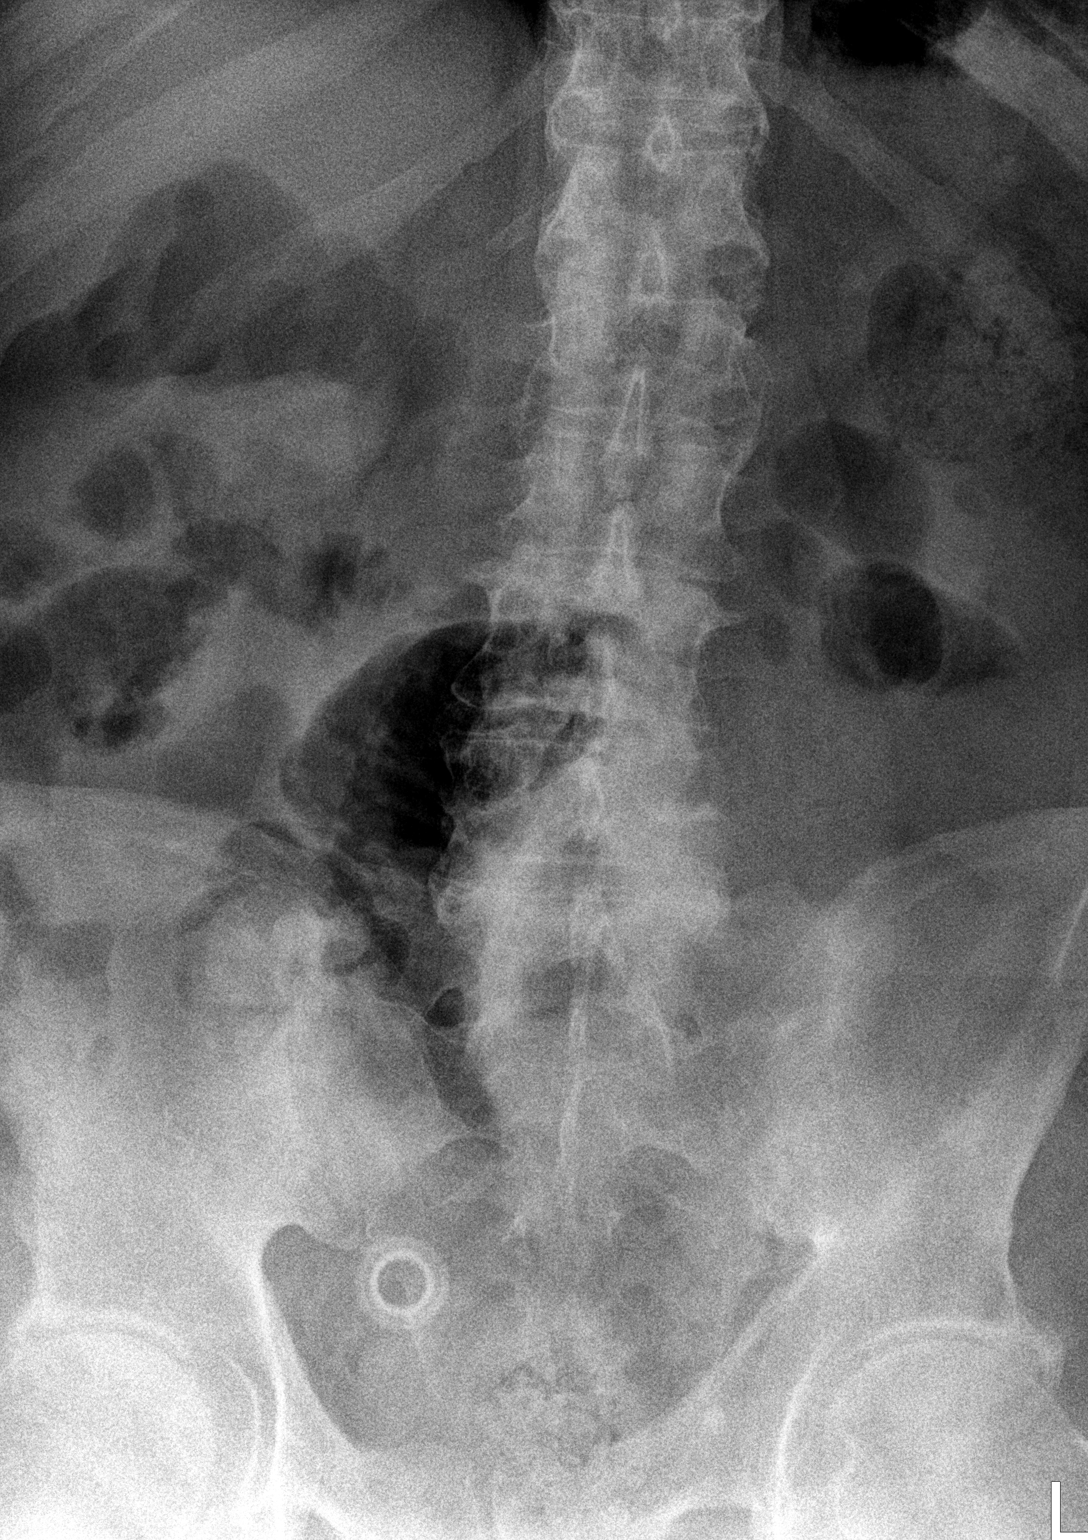

[l-spine lat (1 of 2)]
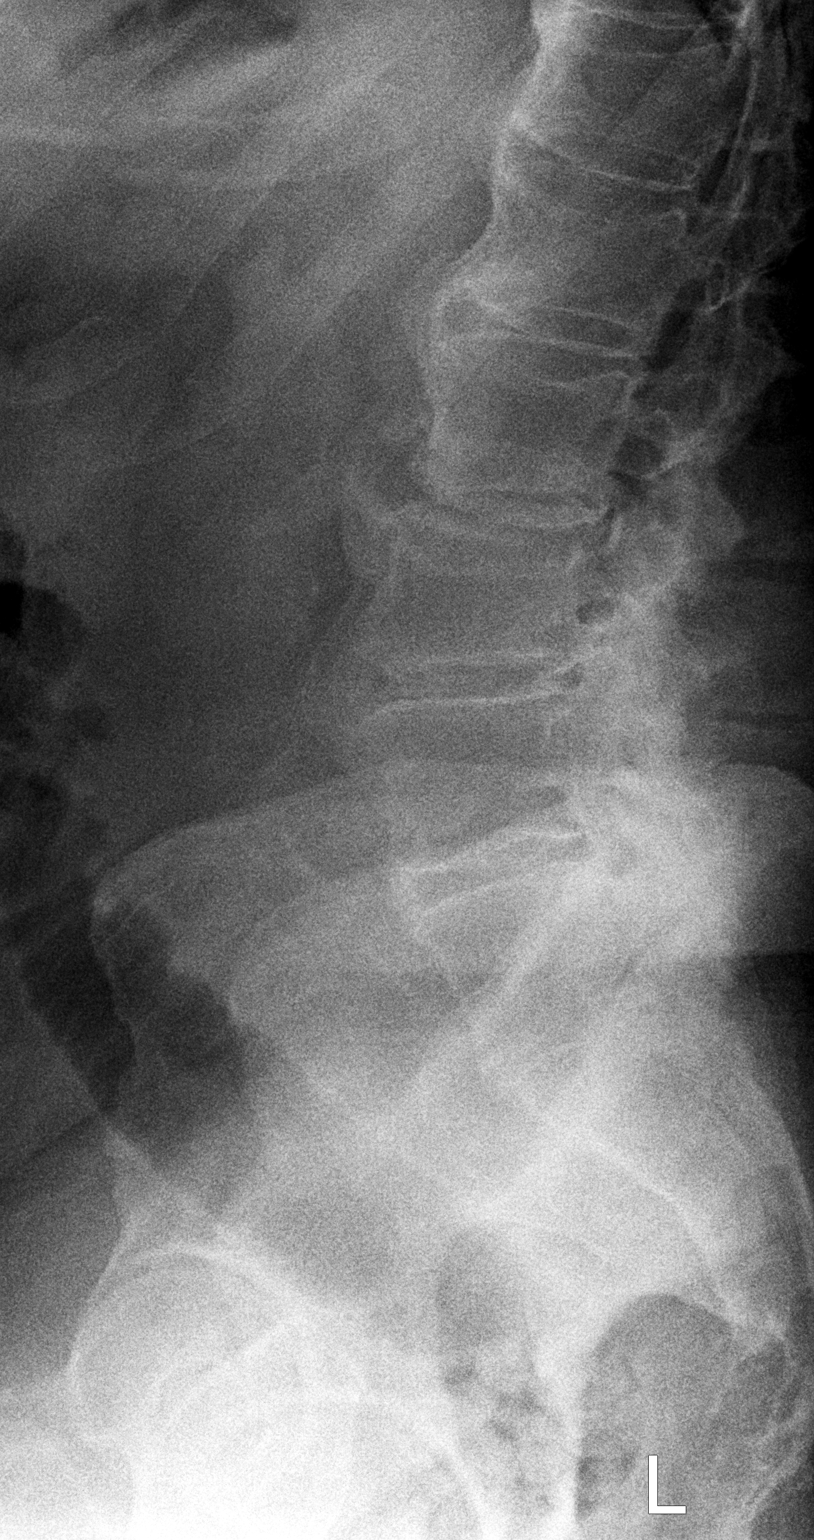

[l-spine lat (2 of 2)]
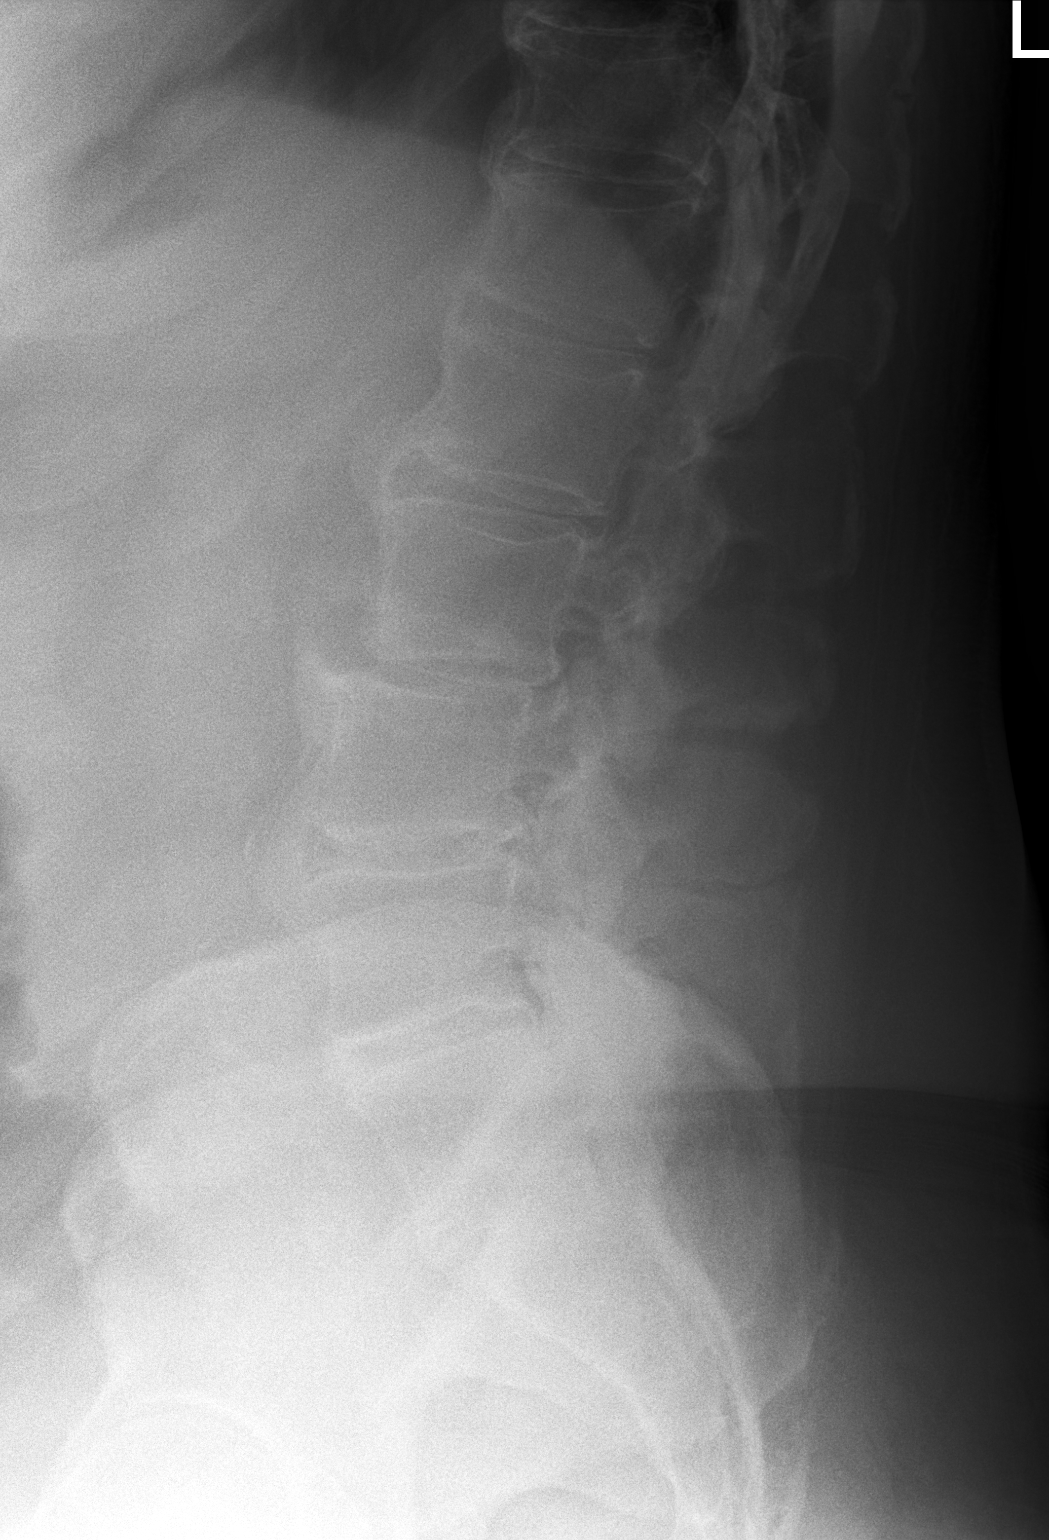

[3 of 3 positions shown; findings below may reference images not displayed]

FINDINGS: Retrolisthesis of L3 on L4. Multilevel anterior endplate
osteophytosis. Lower lumbar spine facet degenerative changes. No
acute fracture.
IMPRESSION: Lower lumbar spine degenerative changes.

## 2024-04-18 ENCOUNTER — Other Ambulatory Visit: Payer: Self-pay | Admitting: Family Medicine

## 2024-04-18 DIAGNOSIS — Z85850 Personal history of malignant neoplasm of thyroid: Secondary | ICD-10-CM

## 2024-04-26 ENCOUNTER — Ambulatory Visit (INDEPENDENT_AMBULATORY_CARE_PROVIDER_SITE_OTHER): Payer: Medicare HMO | Admitting: *Deleted

## 2024-04-26 DIAGNOSIS — E291 Testicular hypofunction: Secondary | ICD-10-CM | POA: Diagnosis not present

## 2024-04-26 NOTE — Patient Instructions (Addendum)
 Testosterone  injection given 175 mg 0.88 ml left thigh intramuscular, patient supplied, patient tolerated well

## 2024-05-07 ENCOUNTER — Encounter: Payer: Self-pay | Admitting: Family Medicine

## 2024-05-10 ENCOUNTER — Ambulatory Visit

## 2024-05-10 DIAGNOSIS — E291 Testicular hypofunction: Secondary | ICD-10-CM | POA: Diagnosis not present

## 2024-05-10 NOTE — Progress Notes (Signed)
 Patient is in office today for a nurse visit for Testosterone  Injection. Patient injection was given in the right thigh per patient request. Patient tolerated injection well.

## 2024-05-12 ENCOUNTER — Other Ambulatory Visit: Payer: Self-pay | Admitting: Family Medicine

## 2024-05-12 DIAGNOSIS — I4821 Permanent atrial fibrillation: Secondary | ICD-10-CM

## 2024-05-12 DIAGNOSIS — I5032 Chronic diastolic (congestive) heart failure: Secondary | ICD-10-CM

## 2024-05-12 DIAGNOSIS — E1159 Type 2 diabetes mellitus with other circulatory complications: Secondary | ICD-10-CM

## 2024-05-17 ENCOUNTER — Ambulatory Visit: Admitting: Family Medicine

## 2024-05-17 ENCOUNTER — Encounter: Payer: Self-pay | Admitting: Family Medicine

## 2024-05-17 VITALS — BP 131/68 | HR 74 | Temp 97.0°F | Ht 75.0 in | Wt 361.0 lb

## 2024-05-17 DIAGNOSIS — K5901 Slow transit constipation: Secondary | ICD-10-CM

## 2024-05-17 DIAGNOSIS — I5032 Chronic diastolic (congestive) heart failure: Secondary | ICD-10-CM

## 2024-05-17 DIAGNOSIS — E1169 Type 2 diabetes mellitus with other specified complication: Secondary | ICD-10-CM | POA: Diagnosis not present

## 2024-05-17 DIAGNOSIS — I4821 Permanent atrial fibrillation: Secondary | ICD-10-CM | POA: Diagnosis not present

## 2024-05-17 DIAGNOSIS — R0789 Other chest pain: Secondary | ICD-10-CM

## 2024-05-17 DIAGNOSIS — E890 Postprocedural hypothyroidism: Secondary | ICD-10-CM

## 2024-05-17 DIAGNOSIS — Z7984 Long term (current) use of oral hypoglycemic drugs: Secondary | ICD-10-CM

## 2024-05-17 DIAGNOSIS — N521 Erectile dysfunction due to diseases classified elsewhere: Secondary | ICD-10-CM

## 2024-05-17 DIAGNOSIS — Z6841 Body Mass Index (BMI) 40.0 and over, adult: Secondary | ICD-10-CM

## 2024-05-17 DIAGNOSIS — K449 Diaphragmatic hernia without obstruction or gangrene: Secondary | ICD-10-CM

## 2024-05-17 NOTE — Progress Notes (Signed)
 Subjective:  Patient ID: Richard Crawford, male    DOB: 04-07-1956, 68 y.o.   MRN: [id number]  Patient Care Team: Severa Rock HERO, FNP as PCP - General (Family Medicine) Lavona Agent, MD as PCP - Cardiology (Cardiology)   Chief Complaint:  discuss echo   HPI: Richard Crawford is a 68 y.o. male presenting on 05/17/2024 for discuss echo  Richard Crawford is a 68 year old male with atrial fibrillation and cardiomyopathy who presents to go over recent cardiac studies. He also has a few issues he would like to discuss.   He experienced severe chest pain after carrying a heavy object a few weeks ago. The pain is described as 'wicked' and located in the central chest area, worsening with deep breathing and certain movements. He recalls a similar pain when he first experienced atrial fibrillation, but notes that the AFib pain was constant and not affected by movement or breathing.  He has a history of atrial fibrillation and cardiomyopathy, with a previously noted enlarged right side of the heart. He underwent a nuclear medicine cardiac scan and an echocardiogram, and was told that his ejection fraction was 55-60%. He recalls being diagnosed with congestive heart failure in the past. He also mentions a history of thyroid  problems, which can contribute to AFib.  He experiences episodes of low blood sugar characterized by 'huffing and puffing,' burning skin, and hand tremors, which resolve after eating. Recent blood sugar readings were 109, 110, and 112 mg/dL. He is concerned about these episodes and their impact on his ability to travel. His father experienced similar low blood sugar episodes.  He has a history of a hiatal hernia, which he associates with pain in the sternal area, especially after large meals. His diet consists mainly of pretzels and fast food, with irregular meal patterns. He experiences constipation and severe hemorrhoid pain, which he describes as 'unbelievable.' He is considering  plant-based supplements for gut health but has not yet started any new treatments.          Relevant past medical, surgical, family, and social history reviewed and updated as indicated.  Allergies and medications reviewed and updated. Data reviewed: Chart in Epic.   Past Medical History:  Diagnosis Date   Anxiety    Arthritis    left shoulder   Atrial fibrillation (HCC)    Cancer (HCC) 1989   thyroid    Chronic heart failure with preserved ejection fraction (HCC)    Diabetes mellitus without complication (HCC)    Dysrhythmia    Hemorrhoids    external   Hx of colonic polyps    Hyperlipidemia    Hypertension    Low testosterone  12/14/2020   Migraine    Papillary thyroid  carcinoma (HCC)    Postoperative hypothyroidism    Primary osteoarthritis of left shoulder    Sleep apnea    Venous stasis dermatitis of both lower extremities     Past Surgical History:  Procedure Laterality Date   ANKLE FUSION     COLONOSCOPY WITH PROPOFOL  N/A 11/07/2023   Procedure: COLONOSCOPY WITH PROPOFOL ;  Surgeon: Cindie Carlin POUR, DO;  Location: [facility];  Service: Endoscopy;  Laterality: N/A;  10:15 am, asa 3   KNEE ARTHROSCOPY     POLYPECTOMY  11/07/2023   Procedure: POLYPECTOMY;  Surgeon: Cindie Carlin POUR, DO;  Location: [facility];  Service: Endoscopy;;   REPLACEMENT TOTAL KNEE Right    SHOULDER ARTHROSCOPY     TOTAL KNEE ARTHROPLASTY Left  TOTAL SHOULDER REPLACEMENT Right     Social History   Socioeconomic History   Marital status: Married    Spouse name: Not on file   Number of children: Not on file   Years of education: Not on file   Highest education level: 12th grade  Occupational History   Occupation: retired  Tobacco Use   Smoking status: Never   Smokeless tobacco: Never  Substance and Sexual Activity   Alcohol use: Not Currently   Drug use: Never   Sexual activity: Not on file  Other Topics Concern   Not on file  Social History Narrative   Lives at home  with wife - Originally from [place] ; moved here so wife could be closer to her grandchildren (second wife)   Social Drivers of Corporate investment banker Strain: Low Risk  (01/24/2023)   Overall Financial Resource Strain (CARDIA)    Difficulty of Paying Living Expenses: Not very hard  Food Insecurity: No Food Insecurity (01/24/2023)   Hunger Vital Sign    Worried About Running Out of Food in the Last Year: Never true    Ran Out of Food in the Last Year: Never true  Transportation Needs: No Transportation Needs (01/24/2023)   PRAPARE - Administrator, Civil Service (Medical): No    Lack of Transportation (Non-Medical): No  Physical Activity: Unknown (01/24/2023)   Exercise Vital Sign    Days of Exercise per Week: 0 days    Minutes of Exercise per Session: Not on file  Stress: No Stress Concern Present (01/24/2023)   [employer] of Occupational Health - Occupational Stress Questionnaire    Feeling of Stress : Only a little  Social Connections: Socially Isolated (01/24/2023)   Social Connection and Isolation Panel    Frequency of Communication with Friends and Family: Once a week    Frequency of Social Gatherings with Friends and Family: Once a week    Attends Religious Services: Never    Database administrator or Organizations: No    Attends Engineer, structural: Not on file    Marital Status: Married  Intimate Partner Violence: Unknown (01/14/2022)   Received from [facility]   HITS    Physically Hurt: Not on file    Insult or Talk Down To: Not on file    Threaten Physical Harm: Not on file    Scream or Curse: Not on file    Outpatient Encounter Medications as of 05/17/2024  Medication Sig   allopurinol  (ZYLOPRIM ) 100 MG tablet Take 1 tablet (100 mg total) by mouth daily.   atorvastatin  (LIPITOR) 20 MG tablet Take 1 tablet (20 mg total) by mouth daily.   Blood Glucose Monitoring Suppl (ONETOUCH VERIO FLEX SYSTEM) w/Device KIT USE IN THE MORNING, AT      NOON, AND AT BEDTIME.   dapagliflozin  propanediol (FARXIGA ) 10 MG TABS tablet Take 1 tablet (10 mg total) by mouth daily before breakfast.   DULoxetine  (CYMBALTA ) 30 MG capsule Take 2 capsules (60 mg total) by mouth daily.   gabapentin  (NEURONTIN ) 100 MG capsule TAKE 2 CAPSULES 3 TIMES A  DAY   glucose blood (ONETOUCH VERIO) test strip Test BS in the morning, at noon and at bedtime Dx E11.41   levothyroxine  (SYNTHROID ) 200 MCG tablet TAKE 1 TABLET DAILY BEFORE BREAKFAST   levothyroxine  (SYNTHROID ) 50 MCG tablet TAKE 1 TABLET DAILY BEFORE BREAKFAST (TAKE IN ADDITIONTO 200MCG TABLET FOR TOTAL DOSE )   metoprolol  tartrate (LOPRESSOR ) 100 MG tablet  TAKE (2) TABLETS DAILY   Multiple Vitamin (MULTIVITAMIN) tablet Take 1 tablet by mouth 2 (two) times daily.   Prodigy Lancets 28G MISC Check BS daily and as needed Dx E11.9   RYBELSUS  14 MG TABS TAKE 1 TABLET DAILY   sildenafil  (VIAGRA ) 100 MG tablet Take 0.5-1 tablets (50-100 mg total) by mouth daily as needed for erectile dysfunction.   SYRINGE-NEEDLE, DISP, 3 ML 20G X 1-1/2 3 ML MISC 1 each by Does not apply route once a week.   testosterone  cypionate (DEPOTESTOSTERONE CYPIONATE) 200 MG/ML injection 150 MG TOTAL INTO THE MUSCLE EVERY 14 DAYS   valsartan  (DIOVAN ) 80 MG tablet TAKE 1 TABLET DAILY   XARELTO  20 MG TABS tablet TAKE 1 TABLET DAILY WITH   SUPPER   Facility-Administered Encounter Medications as of 05/17/2024  Medication   testosterone  cypionate (DEPOTESTOSTERONE CYPIONATE) injection 175 mg    Allergies  Allergen Reactions   Morphine Nausea Only   Morphine And Codeine Nausea And Vomiting and Nausea Only    Pertinent ROS per HPI, otherwise unremarkable      Objective:  BP 131/68   Pulse 74   Temp (!) 97 F (36.1 C)   Ht 6' 3 (1.905 m)   Wt (!) 361 lb (163.7 kg)   SpO2 98%   BMI 45.12 kg/m    Wt Readings from Last 3 Encounters:  05/17/24 (!) 361 lb (163.7 kg)  03/15/24 (!) 358 lb 3.2 oz (162.5 kg)  12/14/23 (!) 363  lb 3.2 oz (164.7 kg)    Physical Exam Vitals and nursing note reviewed.  Constitutional:      Appearance: Normal appearance. He is morbidly obese.  HENT:     Head: Normocephalic and atraumatic.     Mouth/Throat:     Mouth: Mucous membranes are moist.  Eyes:     Comments: Wearing sunglasses in office  Cardiovascular:     Rate and Rhythm: Normal rate. Rhythm irregularly irregular.  Pulmonary:     Effort: Pulmonary effort is normal.     Breath sounds: Normal breath sounds.  Musculoskeletal:     Right lower leg: 1+ Edema present.     Left lower leg: 1+ Edema present.  Skin:    General: Skin is warm and dry.     Capillary Refill: Capillary refill takes less than 2 seconds.  Neurological:     General: No focal deficit present.     Mental Status: He is alert and oriented to person, place, and time.     Gait: Gait abnormal (antalgic, using cane).  Psychiatric:        Mood and Affect: Mood normal.        Behavior: Behavior normal. Behavior is cooperative.        Thought Content: Thought content normal.        Judgment: Judgment normal.      Results for orders placed or performed in visit on 03/15/24  Bayer DCA Hb A1c Waived   Collection Time: 03/15/24  8:35 AM  Result Value Ref Range   HB A1C (BAYER DCA - WAIVED) 7.2 (H) 4.8 - 5.6 %  CBC with Differential/Platelet   Collection Time: 03/15/24  8:38 AM  Result Value Ref Range   WBC 9.9 3.4 - 10.8 x10E3/uL   RBC 6.22 (H) 4.14 - 5.80 x10E6/uL   Hemoglobin 17.2 13.0 - 17.7 g/dL   Hematocrit 45.5 (H) 62.4 - 51.0 %   MCV 88 79 - 97 fL   MCH 27.7 26.6 - 33.0  pg   MCHC 31.6 31.5 - 35.7 g/dL   RDW 85.7 88.3 - 84.5 %   Platelets 207 150 - 450 x10E3/uL   Neutrophils 52 Not Estab. %   Lymphs 30 Not Estab. %   Monocytes 16 Not Estab. %   Eos 1 Not Estab. %   Basos 1 Not Estab. %   Neutrophils Absolute 5.1 1.4 - 7.0 x10E3/uL   Lymphocytes Absolute 3.0 0.7 - 3.1 x10E3/uL   Monocytes Absolute 1.6 (H) 0.1 - 0.9 x10E3/uL   EOS  (ABSOLUTE) 0.1 0.0 - 0.4 x10E3/uL   Basophils Absolute 0.1 0.0 - 0.2 x10E3/uL   Immature Granulocytes 0 Not Estab. %   Immature Grans (Abs) 0.0 0.0 - 0.1 x10E3/uL  CMP14+EGFR   Collection Time: 03/15/24  8:38 AM  Result Value Ref Range   Glucose 122 (H) 70 - 99 mg/dL   BUN 21 8 - 27 mg/dL   Creatinine, Ser 8.68 (H) 0.76 - 1.27 mg/dL   eGFR 59 (L) >40 fO/fpw/8.26   BUN/Creatinine Ratio 16 10 - 24   Sodium 142 134 - 144 mmol/L   Potassium 5.1 3.5 - 5.2 mmol/L   Chloride 101 96 - 106 mmol/L   CO2 22 20 - 29 mmol/L   Calcium  9.6 8.6 - 10.2 mg/dL   Total Protein 7.7 6.0 - 8.5 g/dL   Albumin 4.5 3.9 - 4.9 g/dL   Globulin, Total 3.2 1.5 - 4.5 g/dL   Bilirubin Total 0.9 0.0 - 1.2 mg/dL   Alkaline Phosphatase 178 (H) 44 - 121 IU/L   AST 27 0 - 40 IU/L   ALT 42 0 - 44 IU/L  Lipid panel   Collection Time: 03/15/24  8:38 AM  Result Value Ref Range   Cholesterol, Total 91 (L) 100 - 199 mg/dL   Triglycerides 81 0 - 149 mg/dL   HDL 33 (L) >60 mg/dL   VLDL Cholesterol Cal 17 5 - 40 mg/dL   LDL Chol Calc (NIH) 41 0 - 99 mg/dL   Chol/HDL Ratio 2.8 0.0 - 5.0 ratio  Thyroid  Panel With TSH   Collection Time: 03/15/24  8:38 AM  Result Value Ref Range   TSH 4.010 0.450 - 4.500 uIU/mL   T4, Total 6.8 4.5 - 12.0 ug/dL   T3 Uptake Ratio 28 24 - 39 %   Free Thyroxine Index 1.9 1.2 - 4.9  Testosterone ,Free and Total   Collection Time: 03/15/24  8:38 AM  Result Value Ref Range   Testosterone  368 264 - 916 ng/dL   Testosterone , Free 1.9 (L) 6.6 - 18.1 pg/mL  PSA, total and free   Collection Time: 03/15/24  8:38 AM  Result Value Ref Range   Prostate Specific Ag, Serum 0.4 0.0 - 4.0 ng/mL   PSA, Free 0.21 N/A ng/mL   PSA, Free Pct 52.5 %  Hepatitis C Antibody   Collection Time: 03/15/24  8:38 AM  Result Value Ref Range   Hep C Virus Ab Non Reactive Non Reactive       Pertinent labs & imaging results that were available during my care of the patient were reviewed by me and considered in my  medical decision making.  Assessment & Plan:  Osvaldo Lamping was seen today for discuss echo.  Diagnoses and all orders for this visit:  Chronic heart failure with preserved ejection fraction (HCC)  Permanent atrial fibrillation (HCC)  Chest pain, musculoskeletal  Type 2 diabetes mellitus with other specified complication, without long-term current use of insulin  (HCC)  Slow transit constipation  Erectile dysfunction due to diabetes mellitus (HCC) -     Ambulatory referral to Urology  Postoperative hypothyroidism  Morbid obesity with BMI of 50.0-59.9, adult (HCC)  Hiatal hernia     Heart failure with preserved ejection fraction and cardiomyopathy Heart failure with preserved ejection fraction (HFpEF) and cardiomyopathy. Recent echocardiogram shows normal ejection fraction (EF 55-60%), improved from previous measurements. Right side of the heart remains slightly enlarged, consistent with longstanding cardiomyopathy. No significant blockages or valve issues on recent cardiac evaluations.  Atrial fibrillation Chronic atrial fibrillation with rate control. Recent stress test and echocardiogram show no significant cardiovascular changes. Thyroid  disease and potential sleep apnea are contributing factors. CHADS-VASc score of 3 noted in past records.  Thyroid  disease Thyroid  disease noted as a contributing factor to atrial fibrillation.  Musculoskeletal chest pain (costochondritis) Musculoskeletal chest pain likely due to costochondritis. Pain exacerbated by deep breathing and certain movements, indicating a musculoskeletal origin rather than cardiac. Recent cardiac evaluations were normal, supporting this assessment.  Hiatal hernia Hiatal hernia confirmed on recent scan. Symptoms may include pain in the sternal area, especially with large meals. - Advise small, frequent meals to manage symptoms.  Type 2 diabetes mellitus with episodes of hypoglycemia Type 2 diabetes with recent  episodes suggestive of hypoglycemia. Symptoms include shaking, burning skin, and relief upon eating. Recent A1c was 7.2, slightly increased from previous 6.8. Current blood glucose levels are 109-112, which are within target range. - Instruct to check blood sugar during symptomatic episodes to confirm hypoglycemia. - Consider adjusting Rybelsus  dosage if blood sugars are consistently low.  Obesity Obesity with current weight approximately 353 lbs.  Constipation and hemorrhoids Chronic constipation and hemorrhoids causing significant discomfort. Interested in trying a plant-based supplement called EMMA for gut health. - Approve use of EMMA supplement for constipation. - Suggest Colon Cleanse as an alternative if EMMA is ineffective.  Erectile dysfunction Erectile dysfunction with partial response to current treatment. Referral to urology was made but not yet contacted by the specialist. - Resend referral to urology for further evaluation and management.        Total time spent with patient today was 40 minutes, this time was spent reviewing prior charts, labs, x-rays, discussing plan of care, and documenting the encounter.   Continue all other maintenance medications.  Follow up plan: Return if symptoms worsen or fail to improve.   Continue healthy lifestyle choices, including diet (rich in fruits, vegetables, and lean proteins, and low in salt and simple carbohydrates) and exercise (at least 30 minutes of moderate physical activity daily).  Educational handout given for DM  The above assessment and management plan was discussed with the patient. The patient verbalized understanding of and has agreed to the management plan. Patient is aware to call the clinic if they develop any new symptoms or if symptoms persist or worsen. Patient is aware when to return to the clinic for a follow-up visit. Patient educated on when it is appropriate to go to the emergency department.   Rosaline Bruns, FNP-C [facility] [phone]

## 2024-05-17 NOTE — Patient Instructions (Signed)

## 2024-05-19 ENCOUNTER — Other Ambulatory Visit: Payer: Self-pay | Admitting: Family Medicine

## 2024-05-19 DIAGNOSIS — E1141 Type 2 diabetes mellitus with diabetic mononeuropathy: Secondary | ICD-10-CM

## 2024-05-19 DIAGNOSIS — G8929 Other chronic pain: Secondary | ICD-10-CM

## 2024-05-24 ENCOUNTER — Ambulatory Visit (INDEPENDENT_AMBULATORY_CARE_PROVIDER_SITE_OTHER)

## 2024-05-24 DIAGNOSIS — E291 Testicular hypofunction: Secondary | ICD-10-CM | POA: Diagnosis not present

## 2024-05-24 NOTE — Progress Notes (Signed)
 Patient is in office today for a nurse visit for Testosterone  Injection. Patient given injection left thigh per pt request. Patient tolerated well.

## 2024-05-25 ENCOUNTER — Encounter: Payer: Self-pay | Admitting: Family Medicine

## 2024-05-31 ENCOUNTER — Other Ambulatory Visit: Payer: Self-pay | Admitting: Family Medicine

## 2024-05-31 DIAGNOSIS — E291 Testicular hypofunction: Secondary | ICD-10-CM

## 2024-06-04 ENCOUNTER — Ambulatory Visit: Admitting: Nurse Practitioner

## 2024-06-07 ENCOUNTER — Ambulatory Visit (INDEPENDENT_AMBULATORY_CARE_PROVIDER_SITE_OTHER): Admitting: *Deleted

## 2024-06-07 DIAGNOSIS — E291 Testicular hypofunction: Secondary | ICD-10-CM

## 2024-06-07 NOTE — Progress Notes (Signed)
 Patient is in office today for a nurse visit for Testosterone  Injection. Injection was given in right thigh and patient tolerated well.

## 2024-06-13 ENCOUNTER — Other Ambulatory Visit: Payer: Self-pay | Admitting: Family Medicine

## 2024-06-13 DIAGNOSIS — E1169 Type 2 diabetes mellitus with other specified complication: Secondary | ICD-10-CM

## 2024-06-20 ENCOUNTER — Ambulatory Visit: Admitting: Family Medicine

## 2024-06-20 ENCOUNTER — Ambulatory Visit (INDEPENDENT_AMBULATORY_CARE_PROVIDER_SITE_OTHER)

## 2024-06-20 ENCOUNTER — Encounter: Payer: Self-pay | Admitting: Family Medicine

## 2024-06-20 VITALS — BP 129/75 | HR 93 | Temp 98.5°F | Ht 75.0 in | Wt 366.2 lb

## 2024-06-20 DIAGNOSIS — E1169 Type 2 diabetes mellitus with other specified complication: Secondary | ICD-10-CM

## 2024-06-20 DIAGNOSIS — I4821 Permanent atrial fibrillation: Secondary | ICD-10-CM | POA: Diagnosis not present

## 2024-06-20 DIAGNOSIS — I5032 Chronic diastolic (congestive) heart failure: Secondary | ICD-10-CM | POA: Diagnosis not present

## 2024-06-20 DIAGNOSIS — M25552 Pain in left hip: Secondary | ICD-10-CM

## 2024-06-20 DIAGNOSIS — G8929 Other chronic pain: Secondary | ICD-10-CM

## 2024-06-20 DIAGNOSIS — M1A0410 Idiopathic chronic gout, right hand, without tophus (tophi): Secondary | ICD-10-CM

## 2024-06-20 DIAGNOSIS — E1159 Type 2 diabetes mellitus with other circulatory complications: Secondary | ICD-10-CM | POA: Diagnosis not present

## 2024-06-20 DIAGNOSIS — I152 Hypertension secondary to endocrine disorders: Secondary | ICD-10-CM

## 2024-06-20 DIAGNOSIS — E890 Postprocedural hypothyroidism: Secondary | ICD-10-CM

## 2024-06-20 DIAGNOSIS — M5441 Lumbago with sciatica, right side: Secondary | ICD-10-CM

## 2024-06-20 DIAGNOSIS — M5442 Lumbago with sciatica, left side: Secondary | ICD-10-CM

## 2024-06-20 DIAGNOSIS — E291 Testicular hypofunction: Secondary | ICD-10-CM

## 2024-06-20 DIAGNOSIS — E1141 Type 2 diabetes mellitus with diabetic mononeuropathy: Secondary | ICD-10-CM

## 2024-06-20 DIAGNOSIS — R3915 Urgency of urination: Secondary | ICD-10-CM

## 2024-06-20 LAB — LIPID PANEL

## 2024-06-20 LAB — BAYER DCA HB A1C WAIVED: HB A1C (BAYER DCA - WAIVED): 7.1 % — ABNORMAL HIGH (ref 4.8–5.6)

## 2024-06-20 MED ORDER — LEVOTHYROXINE SODIUM 50 MCG PO TABS: ORAL_TABLET | ORAL | 1 refills | Status: DC

## 2024-06-20 MED ORDER — RIVAROXABAN 20 MG PO TABS: 20.0000 mg | ORAL_TABLET | Freq: Every day | ORAL | 1 refills | Status: DC

## 2024-06-20 MED ORDER — VALSARTAN 80 MG PO TABS
80.0000 mg | ORAL_TABLET | Freq: Every day | ORAL | 1 refills | Status: AC
Start: 2024-06-20 — End: ?

## 2024-06-20 MED ORDER — GABAPENTIN 100 MG PO CAPS: 100.0000 mg | ORAL_CAPSULE | Freq: Three times a day (TID) | ORAL | 1 refills | Status: AC

## 2024-06-20 MED ORDER — ALLOPURINOL 100 MG PO TABS: 100.0000 mg | ORAL_TABLET | Freq: Every day | ORAL | 1 refills | Status: DC

## 2024-06-20 MED ORDER — TAMSULOSIN HCL 0.4 MG PO CAPS: 0.4000 mg | ORAL_CAPSULE | Freq: Every day | ORAL | 3 refills | Status: DC

## 2024-06-20 MED ORDER — TESTOSTERONE CYPIONATE 200 MG/ML IM SOLN: INTRAMUSCULAR | 1 refills | Status: DC

## 2024-06-20 NOTE — Patient Instructions (Addendum)

## 2024-06-20 NOTE — Progress Notes (Signed)
 Subjective:  Patient ID: Richard Crawford, male    DOB: 12-06-1955, 68 y.o.   MRN: [id number]  Patient Care Team: Severa Rock HERO, FNP as PCP - General (Family Medicine) Lavona Agent, MD as PCP - Cardiology (Cardiology)   Chief Complaint:  Diabetes (3 month follow up )   HPI: Richard Crawford is a 68 y.o. male presenting on 06/20/2024 for Diabetes (3 month follow up )   Richard Crawford is a 68 year old male with diabetes who presents with urinary urgency and incontinence.  He has been experiencing urinary urgency and incontinence for several months, with severe urgency often leading to episodes of incontinence. He is currently taking Tamsulosin  0.4 mg daily, but the symptoms persist. No hematuria or dysuria is noted.  He has a history of diabetes, managed with Rybelsus  and Farxiga . Recently, his blood sugar spiked to 170 mg/dL following a cortisone shot in the shoulder but has since decreased to 132 mg/dL. His diet includes a high intake of junk food and sweets, such as 'Cocoa Krispies' and 'middle wafers.'  He experiences chronic hip pain, which is worsened by movement such as standing or bouncing in a truck. He uses heating pads and Biofreeze for relief. A recent cortisone shot alleviated shoulder pain.  He has a history of erectile dysfunction and has previously used Viagra .  He has hypertension, managed with Valsartan . He experiences shortness of breath with exertion, which resolves with rest.  He mentions a history of taking diuretics, which he refers to as 'piss pills,' attributing them to worsening his urinary symptoms and expressing frustration with their side effects.          Relevant past medical, surgical, family, and social history reviewed and updated as indicated.  Allergies and medications reviewed and updated. Data reviewed: Chart in Epic.   Past Medical History:  Diagnosis Date   Anxiety    Arthritis    left shoulder   Atrial fibrillation (HCC)     Cancer (HCC) 1989   thyroid    Chronic heart failure with preserved ejection fraction (HCC)    Diabetes mellitus without complication (HCC)    Dysrhythmia    Hemorrhoids    external   Hx of colonic polyps    Hyperlipidemia    Hypertension    Low testosterone  12/14/2020   Migraine    Papillary thyroid  carcinoma (HCC)    Postoperative hypothyroidism    Primary osteoarthritis of left shoulder    Sleep apnea    Venous stasis dermatitis of both lower extremities     Past Surgical History:  Procedure Laterality Date   ANKLE FUSION     COLONOSCOPY WITH PROPOFOL  N/A 11/07/2023   Procedure: COLONOSCOPY WITH PROPOFOL ;  Surgeon: Cindie Carlin POUR, DO;  Location: [facility];  Service: Endoscopy;  Laterality: N/A;  10:15 am, asa 3   KNEE ARTHROSCOPY     POLYPECTOMY  11/07/2023   Procedure: POLYPECTOMY;  Surgeon: Cindie Carlin POUR, DO;  Location: [facility];  Service: Endoscopy;;   REPLACEMENT TOTAL KNEE Right    SHOULDER ARTHROSCOPY     TOTAL KNEE ARTHROPLASTY Left    TOTAL SHOULDER REPLACEMENT Right     Social History   Socioeconomic History   Marital status: Married    Spouse name: Not on file   Number of children: Not on file   Years of education: Not on file   Highest education level: 12th grade  Occupational History   Occupation: retired  Tobacco Use  Smoking status: Never   Smokeless tobacco: Never  Substance and Sexual Activity   Alcohol use: Not Currently   Drug use: Never   Sexual activity: Not on file  Other Topics Concern   Not on file  Social History Narrative   Lives at home with wife - Originally from [place] ; moved here so wife could be closer to her grandchildren (second wife)   Social Drivers of Corporate investment banker Strain: Low Risk  (01/24/2023)   Overall Financial Resource Strain (CARDIA)    Difficulty of Paying Living Expenses: Not very hard  Food Insecurity: No Food Insecurity (01/24/2023)   Hunger Vital Sign    Worried About Running  Out of Food in the Last Year: Never true    Ran Out of Food in the Last Year: Never true  Transportation Needs: No Transportation Needs (01/24/2023)   PRAPARE - Administrator, Civil Service (Medical): No    Lack of Transportation (Non-Medical): No  Physical Activity: Unknown (01/24/2023)   Exercise Vital Sign    Days of Exercise per Week: 0 days    Minutes of Exercise per Session: Not on file  Stress: No Stress Concern Present (01/24/2023)   [employer] of Occupational Health - Occupational Stress Questionnaire    Feeling of Stress : Only a little  Social Connections: Socially Isolated (01/24/2023)   Social Connection and Isolation Panel    Frequency of Communication with Friends and Family: Once a week    Frequency of Social Gatherings with Friends and Family: Once a week    Attends Religious Services: Never    Database administrator or Organizations: No    Attends Engineer, structural: Not on file    Marital Status: Married  Intimate Partner Violence: Unknown (01/14/2022)   Received from [facility]   HITS    Physically Hurt: Not on file    Insult or Talk Down To: Not on file    Threaten Physical Harm: Not on file    Scream or Curse: Not on file    Outpatient Encounter Medications as of 06/20/2024  Medication Sig   allopurinol  (ZYLOPRIM ) 100 MG tablet Take 1 tablet (100 mg total) by mouth daily.   atorvastatin  (LIPITOR) 20 MG tablet Take 1 tablet (20 mg total) by mouth daily.   Blood Glucose Monitoring Suppl (ONETOUCH VERIO FLEX SYSTEM) w/Device KIT USE IN THE MORNING, AT     NOON, AND AT BEDTIME.   dapagliflozin  propanediol (FARXIGA ) 10 MG TABS tablet Take 1 tablet (10 mg total) by mouth daily before breakfast.   DULoxetine  (CYMBALTA ) 30 MG capsule Take 2 capsules (60 mg total) by mouth daily.   gabapentin  (NEURONTIN ) 100 MG capsule Take 1 capsule (100 mg total) by mouth 3 (three) times daily.   glucose blood (ONETOUCH VERIO) test strip Test BS in the  morning, at noon and at bedtime Dx E11.41   levothyroxine  (SYNTHROID ) 200 MCG tablet TAKE 1 TABLET DAILY BEFORE BREAKFAST   levothyroxine  (SYNTHROID ) 50 MCG tablet TAKE 1 TABLET DAILY BEFORE BREAKFAST (TAKE IN ADDITIONTO 200MCG TABLET FOR TOTAL DOSE )   metoprolol  tartrate (LOPRESSOR ) 100 MG tablet TAKE (2) TABLETS DAILY   Multiple Vitamin (MULTIVITAMIN) tablet Take 1 tablet by mouth 2 (two) times daily.   Prodigy Lancets 28G MISC Check BS daily and as needed Dx E11.9   rivaroxaban  (XARELTO ) 20 MG TABS tablet Take 1 tablet (20 mg total) by mouth daily with supper.   RYBELSUS  14 MG TABS  TAKE 1 TABLET DAILY   sildenafil  (VIAGRA ) 100 MG tablet Take 0.5-1 tablets (50-100 mg total) by mouth daily as needed for erectile dysfunction.   SYRINGE-NEEDLE, DISP, 3 ML 20G X 1-1/2 3 ML MISC 1 each by Does not apply route once a week.   tamsulosin  (FLOMAX ) 0.4 MG CAPS capsule Take 1 capsule (0.4 mg total) by mouth daily.   testosterone  cypionate (DEPOTESTOSTERONE CYPIONATE) 200 MG/ML injection INJECT 0.75 ML (150 MG) INTRAMUSCULARLY EVERY 14 DAYS   valsartan  (DIOVAN ) 80 MG tablet Take 1 tablet (80 mg total) by mouth daily.   [DISCONTINUED] allopurinol  (ZYLOPRIM ) 100 MG tablet Take 1 tablet (100 mg total) by mouth daily.   [DISCONTINUED] gabapentin  (NEURONTIN ) 100 MG capsule TAKE 2 CAPSULES 3 TIMES A  DAY   [DISCONTINUED] levothyroxine  (SYNTHROID ) 50 MCG tablet TAKE 1 TABLET DAILY BEFORE BREAKFAST (TAKE IN ADDITIONTO 200MCG TABLET FOR TOTAL DOSE )   [DISCONTINUED] testosterone  cypionate (DEPOTESTOSTERONE CYPIONATE) 200 MG/ML injection INJECT 0.75 ML (150 MG) INTRAMUSCULARLY EVERY 14 DAYS   [DISCONTINUED] valsartan  (DIOVAN ) 80 MG tablet TAKE 1 TABLET DAILY   [DISCONTINUED] XARELTO  20 MG TABS tablet TAKE 1 TABLET DAILY WITH   SUPPER   Facility-Administered Encounter Medications as of 06/20/2024  Medication   testosterone  cypionate (DEPOTESTOSTERONE CYPIONATE) injection 175 mg    Allergies   Allergen Reactions   Morphine Nausea Only    morphine   Morphine And Codeine Nausea And Vomiting and Nausea Only    Pertinent ROS per HPI, otherwise unremarkable      Objective:  BP 129/75   Pulse 93   Temp 98.5 F (36.9 C)   Ht 6' 3 (1.905 m)   Wt (!) 366 lb 3.2 oz (166.1 kg)   SpO2 94%   BMI 45.77 kg/m    Wt Readings from Last 3 Encounters:  06/20/24 (!) 366 lb 3.2 oz (166.1 kg)  05/17/24 (!) 361 lb (163.7 kg)  03/15/24 (!) 358 lb 3.2 oz (162.5 kg)    Physical Exam Vitals and nursing note reviewed.  Constitutional:      General: He is not in acute distress.    Appearance: Normal appearance. He is morbidly obese. He is not ill-appearing, toxic-appearing or diaphoretic.  HENT:     Head: Normocephalic and atraumatic.     Nose: Nose normal.     Mouth/Throat:     Mouth: Mucous membranes are moist.  Eyes:     Comments: Wearing sunglasses in office  Cardiovascular:     Rate and Rhythm: Normal rate. Rhythm irregularly irregular.     Heart sounds: Normal heart sounds.  Pulmonary:     Effort: Pulmonary effort is normal.     Breath sounds: Normal breath sounds.  Musculoskeletal:     Thoracic back: Normal.     Lumbar back: Decreased range of motion.     Right hip: Tenderness present. Decreased range of motion.     Left hip: Tenderness present. Decreased range of motion.     Right lower leg: Edema present.     Left lower leg: Edema present.  Skin:    General: Skin is warm and dry.     Capillary Refill: Capillary refill takes less than 2 seconds.  Neurological:     General: No focal deficit present.     Mental Status: He is alert and oriented to person, place, and time.     Gait: Gait abnormal (antalgic, using cane).  Psychiatric:        Mood and Affect: Mood normal.  Behavior: Behavior normal. Behavior is cooperative.        Thought Content: Thought content normal.        Judgment: Judgment normal.       Results for orders placed or performed in  visit on 03/15/24  Bayer DCA Hb A1c Waived   Collection Time: 03/15/24  8:35 AM  Result Value Ref Range   HB A1C (BAYER DCA - WAIVED) 7.2 (H) 4.8 - 5.6 %  CBC with Differential/Platelet   Collection Time: 03/15/24  8:38 AM  Result Value Ref Range   WBC 9.9 3.4 - 10.8 x10E3/uL   RBC 6.22 (H) 4.14 - 5.80 x10E6/uL   Hemoglobin 17.2 13.0 - 17.7 g/dL   Hematocrit 45.5 (H) 62.4 - 51.0 %   MCV 88 79 - 97 fL   MCH 27.7 26.6 - 33.0 pg   MCHC 31.6 31.5 - 35.7 g/dL   RDW 85.7 88.3 - 84.5 %   Platelets 207 150 - 450 x10E3/uL   Neutrophils 52 Not Estab. %   Lymphs 30 Not Estab. %   Monocytes 16 Not Estab. %   Eos 1 Not Estab. %   Basos 1 Not Estab. %   Neutrophils Absolute 5.1 1.4 - 7.0 x10E3/uL   Lymphocytes Absolute 3.0 0.7 - 3.1 x10E3/uL   Monocytes Absolute 1.6 (H) 0.1 - 0.9 x10E3/uL   EOS (ABSOLUTE) 0.1 0.0 - 0.4 x10E3/uL   Basophils Absolute 0.1 0.0 - 0.2 x10E3/uL   Immature Granulocytes 0 Not Estab. %   Immature Grans (Abs) 0.0 0.0 - 0.1 x10E3/uL  CMP14+EGFR   Collection Time: 03/15/24  8:38 AM  Result Value Ref Range   Glucose 122 (H) 70 - 99 mg/dL   BUN 21 8 - 27 mg/dL   Creatinine, Ser 8.68 (H) 0.76 - 1.27 mg/dL   eGFR 59 (L) >40 fO/fpw/8.26   BUN/Creatinine Ratio 16 10 - 24   Sodium 142 134 - 144 mmol/L   Potassium 5.1 3.5 - 5.2 mmol/L   Chloride 101 96 - 106 mmol/L   CO2 22 20 - 29 mmol/L   Calcium  9.6 8.6 - 10.2 mg/dL   Total Protein 7.7 6.0 - 8.5 g/dL   Albumin 4.5 3.9 - 4.9 g/dL   Globulin, Total 3.2 1.5 - 4.5 g/dL   Bilirubin Total 0.9 0.0 - 1.2 mg/dL   Alkaline Phosphatase 178 (H) 44 - 121 IU/L   AST 27 0 - 40 IU/L   ALT 42 0 - 44 IU/L  Lipid panel   Collection Time: 03/15/24  8:38 AM  Result Value Ref Range   Cholesterol, Total 91 (L) 100 - 199 mg/dL   Triglycerides 81 0 - 149 mg/dL   HDL 33 (L) >60 mg/dL   VLDL Cholesterol Cal 17 5 - 40 mg/dL   LDL Chol Calc (NIH) 41 0 - 99 mg/dL   Chol/HDL Ratio 2.8 0.0 - 5.0 ratio  Thyroid  Panel With TSH   Collection  Time: 03/15/24  8:38 AM  Result Value Ref Range   TSH 4.010 0.450 - 4.500 uIU/mL   T4, Total 6.8 4.5 - 12.0 ug/dL   T3 Uptake Ratio 28 24 - 39 %   Free Thyroxine Index 1.9 1.2 - 4.9  Testosterone ,Free and Total   Collection Time: 03/15/24  8:38 AM  Result Value Ref Range   Testosterone  368 264 - 916 ng/dL   Testosterone , Free 1.9 (L) 6.6 - 18.1 pg/mL  PSA, total and free   Collection Time: 03/15/24  8:38 AM  Result Value Ref Range   Prostate Specific Ag, Serum 0.4 0.0 - 4.0 ng/mL   PSA, Free 0.21 N/A ng/mL   PSA, Free Pct 52.5 %  Hepatitis C Antibody   Collection Time: 03/15/24  8:38 AM  Result Value Ref Range   Hep C Virus Ab Non Reactive Non Reactive       Pertinent labs & imaging results that were available during my care of the patient were reviewed by me and considered in my medical decision making.  Assessment & Plan:  Creighton Longley was seen today for diabetes.  Diagnoses and all orders for this visit:  Type 2 diabetes mellitus with other specified complication, without long-term current use of insulin  (HCC) -     Bayer DCA Hb A1c Waived -     CBC with Differential/Platelet -     CMP14+EGFR -     Lipid panel -     Microalbumin / creatinine urine ratio -     Thyroid  Panel With TSH  Hypertension associated with diabetes (HCC) -     CBC with Differential/Platelet -     CMP14+EGFR -     Microalbumin / creatinine urine ratio -     valsartan  (DIOVAN ) 80 MG tablet; Take 1 tablet (80 mg total) by mouth daily. -     Thyroid  Panel With TSH  Hyperlipidemia associated with type 2 diabetes mellitus (HCC) -     Bayer DCA Hb A1c Waived -     CMP14+EGFR -     Lipid panel  Permanent atrial fibrillation (HCC) -     CBC with Differential/Platelet -     CMP14+EGFR -     Lipid panel -     rivaroxaban  (XARELTO ) 20 MG TABS tablet; Take 1 tablet (20 mg total) by mouth daily with supper. -     Thyroid  Panel With TSH  Chronic heart failure with preserved ejection fraction  (HCC) -     CBC with Differential/Platelet -     CMP14+EGFR -     Lipid panel -     valsartan  (DIOVAN ) 80 MG tablet; Take 1 tablet (80 mg total) by mouth daily.  Chronic bilateral low back pain with bilateral sciatica -     gabapentin  (NEURONTIN ) 100 MG capsule; Take 1 capsule (100 mg total) by mouth 3 (three) times daily.  Diabetic mononeuropathy associated with type 2 diabetes mellitus (HCC) -     Bayer DCA Hb A1c Waived -     gabapentin  (NEURONTIN ) 100 MG capsule; Take 1 capsule (100 mg total) by mouth 3 (three) times daily.  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  Testosterone  deficiency in male -     CBC with Differential/Platelet -     testosterone  cypionate (DEPOTESTOSTERONE CYPIONATE) 200 MG/ML injection; INJECT 0.75 ML (150 MG) INTRAMUSCULARLY EVERY 14 DAYS  Postoperative hypothyroidism -     levothyroxine  (SYNTHROID ) 50 MCG tablet; TAKE 1 TABLET DAILY BEFORE BREAKFAST (TAKE IN ADDITIONTO 200MCG TABLET FOR TOTAL DOSE ) -     Thyroid  Panel With TSH  Urinary urgency -     CMP14+EGFR -     tamsulosin  (FLOMAX ) 0.4 MG CAPS capsule; Take 1 capsule (0.4 mg total) by mouth daily.  Morbid obesity (HCC) -     Bayer DCA Hb A1c Waived -     CBC with Differential/Platelet -  CMP14+EGFR -     Lipid panel -     Thyroid  Panel With TSH  Left hip pain -     DG HIP UNILAT W OR W/O PELVIS 2-3 VIEWS LEFT; Future      Type 2 diabetes mellitus with complications (including circulatory complications and diabetic mononeuropathy) Blood sugar levels have fluctuated, recently spiking to 170s due to a cortisone shot, but have stabilized to 132. A1c decreased to 7.1 from 7.2, indicating improvement but still above the target of under 7. Dietary habits include high intake of junk food and chocolate, contributing to difficulty in maintaining target A1c levels. -  Continue Rybelsus  and Farxiga . - Provide information on Mounjaro for potential switch to once-weekly injectable for better weight reduction.  Obesity Current weight is 366 pounds, previously 303 pounds. High intake of junk food and chocolate may contribute to weight gain. - Provide information on Mounjaro for potential switch to once-weekly injectable for better weight reduction.  Chronic diastolic heart failure and permanent atrial fibrillation No recent palpitations or significant symptom changes. Last echocardiogram in April showed right atrial enlargement but normal ejection fraction and pumping pressures. - Continue Valsartan  for blood pressure management.  Urinary incontinence Experiencing significant urgency and incontinence with episodes of leakage before reaching the bathroom. Symptoms ongoing for months. No blood or pain associated with urination. - Follow up on urology referral with referrals coordinator. - Continue Tamsulosin  and Tritan for urinary symptoms.  Chronic left hip pain Chronic left hip pain, possibly bursitis, with significant pain upon standing, walking, and sitting, exacerbated by movement. - Order x-ray of pelvis and left hip to assess for underlying issues. - Continue use of heating pads and Biofreeze for pain management.      I spent 40 minutes dedicated to the care of this patient on the date of this encounter to include pre-visit review of records including diagnostic studies and labs, face-to-face time with the patient discussing above conditions, post visit ordering of testing, clinical documentation in the electronic health record, making appropriate referrals as documented, and communicating necessary information to the patient's healthcare team.      Continue all other maintenance medications.  Follow up plan: Return in about 3 months (around 09/19/2024) for DM.   Continue healthy lifestyle choices, including diet (rich in fruits, vegetables, and  lean proteins, and low in salt and simple carbohydrates) and exercise (at least 30 minutes of moderate physical activity daily).  Educational handout given for DM  The above assessment and management plan was discussed with the patient. The patient verbalized understanding of and has agreed to the management plan. Patient is aware to call the clinic if they develop any new symptoms or if symptoms persist or worsen. Patient is aware when to return to the clinic for a follow-up visit. Patient educated on when it is appropriate to go to the emergency department.   Rosaline Bruns, FNP-C [facility] [phone]

## 2024-06-21 ENCOUNTER — Other Ambulatory Visit: Payer: Self-pay | Admitting: Family Medicine

## 2024-06-21 ENCOUNTER — Ambulatory Visit (INDEPENDENT_AMBULATORY_CARE_PROVIDER_SITE_OTHER): Admitting: *Deleted

## 2024-06-21 ENCOUNTER — Ambulatory Visit: Payer: Self-pay | Admitting: Family Medicine

## 2024-06-21 DIAGNOSIS — E291 Testicular hypofunction: Secondary | ICD-10-CM

## 2024-06-21 DIAGNOSIS — M1A0410 Idiopathic chronic gout, right hand, without tophus (tophi): Secondary | ICD-10-CM

## 2024-06-21 DIAGNOSIS — E1169 Type 2 diabetes mellitus with other specified complication: Secondary | ICD-10-CM

## 2024-06-21 LAB — CMP14+EGFR
ALT: 58 IU/L — AB (ref 0–44)
AST: 38 IU/L (ref 0–40)
Albumin: 4.1 g/dL (ref 3.9–4.9)
Alkaline Phosphatase: 176 IU/L — AB (ref 44–121)
BUN/Creatinine Ratio: 16 (ref 10–24)
BUN: 23 mg/dL (ref 8–27)
Bilirubin Total: 1 mg/dL (ref 0.0–1.2)
CO2: 23 mmol/L (ref 20–29)
Calcium: 8.7 mg/dL (ref 8.6–10.2)
Chloride: 104 mmol/L (ref 96–106)
Creatinine, Ser: 1.42 mg/dL — AB (ref 0.76–1.27)
Globulin, Total: 3.1 g/dL (ref 1.5–4.5)
Glucose: 121 mg/dL — AB (ref 70–99)
Potassium: 4.7 mmol/L (ref 3.5–5.2)
Sodium: 144 mmol/L (ref 134–144)
Total Protein: 7.2 g/dL (ref 6.0–8.5)
eGFR: 53 mL/min/1.73 — AB (ref >59)

## 2024-06-21 LAB — CBC WITH DIFFERENTIAL/PLATELET
Basophils Absolute: 0.1 x10E3/uL (ref 0.0–0.2)
Basos: 1 %
EOS (ABSOLUTE): 0.1 x10E3/uL (ref 0.0–0.4)
Eos: 1 %
Hematocrit: 52.7 % — ABNORMAL HIGH (ref 37.5–51.0)
Hemoglobin: 16.6 g/dL (ref 13.0–17.7)
Immature Grans (Abs): 0 x10E3/uL (ref 0.0–0.1)
Immature Granulocytes: 0 %
Lymphocytes Absolute: 3 x10E3/uL (ref 0.7–3.1)
Lymphs: 30 %
MCH: 27.7 pg (ref 26.6–33.0)
MCHC: 31.5 g/dL (ref 31.5–35.7)
MCV: 88 fL (ref 79–97)
Monocytes Absolute: 1.6 x10E3/uL — ABNORMAL HIGH (ref 0.1–0.9)
Monocytes: 16 %
Neutrophils Absolute: 5.3 x10E3/uL (ref 1.4–7.0)
Neutrophils: 52 %
Platelets: 172 x10E3/uL (ref 150–450)
RBC: 6 x10E6/uL — ABNORMAL HIGH (ref 4.14–5.80)
RDW: 13.7 % (ref 11.6–15.4)
WBC: 10.1 x10E3/uL (ref 3.4–10.8)

## 2024-06-21 LAB — LIPID PANEL
Cholesterol, Total: 100 mg/dL (ref 100–199)
HDL: 36 mg/dL — AB (ref >39)
LDL CALC COMMENT:: 2.8 ratio (ref 0.0–5.0)
LDL Chol Calc (NIH): 45 mg/dL (ref 0–99)
Triglycerides: 101 mg/dL (ref 0–149)
VLDL Cholesterol Cal: 19 mg/dL (ref 5–40)

## 2024-06-21 NOTE — Progress Notes (Signed)
 Patient is in office today for a nurse visit for Testosterone  Injection. Injection was given in Left thigh. Patient tolerated well.

## 2024-06-24 ENCOUNTER — Encounter: Payer: Self-pay | Admitting: Family Medicine

## 2024-06-26 ENCOUNTER — Other Ambulatory Visit: Payer: Self-pay | Admitting: Family Medicine

## 2024-06-26 DIAGNOSIS — G8929 Other chronic pain: Secondary | ICD-10-CM

## 2024-06-26 DIAGNOSIS — E1141 Type 2 diabetes mellitus with diabetic mononeuropathy: Secondary | ICD-10-CM

## 2024-06-27 ENCOUNTER — Encounter: Payer: Self-pay | Admitting: Family Medicine

## 2024-07-05 ENCOUNTER — Ambulatory Visit (INDEPENDENT_AMBULATORY_CARE_PROVIDER_SITE_OTHER): Admitting: *Deleted

## 2024-07-05 DIAGNOSIS — E291 Testicular hypofunction: Secondary | ICD-10-CM

## 2024-07-05 NOTE — Progress Notes (Signed)
 Patient is in office today for a nurse visit for Testosterone  Injection. Patient Patient Injection was given in the  Right thigh. Patient tolerated injection well.

## 2024-07-08 ENCOUNTER — Other Ambulatory Visit: Payer: Self-pay | Admitting: Family Medicine

## 2024-07-08 DIAGNOSIS — E1169 Type 2 diabetes mellitus with other specified complication: Secondary | ICD-10-CM

## 2024-07-19 ENCOUNTER — Ambulatory Visit (INDEPENDENT_AMBULATORY_CARE_PROVIDER_SITE_OTHER): Admitting: *Deleted

## 2024-07-19 DIAGNOSIS — E291 Testicular hypofunction: Secondary | ICD-10-CM

## 2024-07-19 NOTE — Progress Notes (Signed)
Patient is in office today for a nurse visit for Testosterone Injection. Patient Injection was given in the  Left vastus lateralis. Patient tolerated injection well.

## 2024-07-29 ENCOUNTER — Other Ambulatory Visit: Payer: Self-pay | Admitting: Family Medicine

## 2024-07-29 DIAGNOSIS — E1169 Type 2 diabetes mellitus with other specified complication: Secondary | ICD-10-CM

## 2024-08-02 ENCOUNTER — Ambulatory Visit (INDEPENDENT_AMBULATORY_CARE_PROVIDER_SITE_OTHER): Admitting: *Deleted

## 2024-08-02 DIAGNOSIS — E291 Testicular hypofunction: Secondary | ICD-10-CM | POA: Diagnosis not present

## 2024-08-02 NOTE — Progress Notes (Signed)
 Patient is in office today for a nurse visit for Testosterone  Injection. Injection given right thigh. Patient tolerated well. Patient supplied.

## 2024-08-11 ENCOUNTER — Telehealth: Admitting: Emergency Medicine

## 2024-08-11 DIAGNOSIS — M25521 Pain in right elbow: Secondary | ICD-10-CM | POA: Diagnosis not present

## 2024-08-11 MED ORDER — PREDNISONE 20 MG PO TABS: 40.0000 mg | ORAL_TABLET | Freq: Every day | ORAL | 0 refills | Status: AC

## 2024-08-11 NOTE — Patient Instructions (Signed)
 Richard Crawford, thank you for joining Jon CHRISTELLA Belt, NP for today's virtual visit.  While this provider is not your primary care provider (PCP), if your PCP is located in our provider database this encounter information will be shared with them immediately following your visit.   A [facility] MyChart account gives you access to today's visit and all your visits, tests, and labs performed at [facility]  click here if you don't have a  MyChart account or go to [email or web address]  Consent: (Patient) Deovion Batrez provided verbal consent for this virtual visit at the beginning of the encounter.  Current Medications:  Current Outpatient Medications:    predniSONE  (DELTASONE ) 20 MG tablet, Take 2 tablets (40 mg total) by mouth daily with breakfast for 5 days., Disp: 10 tablet, Rfl: 0   allopurinol  (ZYLOPRIM ) 100 MG tablet, TAKE 1 TABLET DAILY, Disp: 90 tablet, Rfl: 1   atorvastatin  (LIPITOR) 20 MG tablet, TAKE 1 TABLET DAILY, Disp: 90 tablet, Rfl: 1   Blood Glucose Monitoring Suppl (ONETOUCH VERIO FLEX SYSTEM) w/Device KIT, USE IN THE MORNING, AT     NOON, AND AT BEDTIME., Disp: 1 kit, Rfl: 0   DULoxetine  (CYMBALTA ) 30 MG capsule, Take 2 capsules (60 mg total) by mouth daily., Disp: 180 capsule, Rfl: 1   FARXIGA  10 MG TABS tablet, TAKE 1 TABLET DAILY BEFORE BREAKFAST, Disp: 90 tablet, Rfl: 0   gabapentin  (NEURONTIN ) 100 MG capsule, Take 1 capsule (100 mg total) by mouth 3 (three) times daily., Disp: 270 capsule, Rfl: 1   glucose blood (ONETOUCH VERIO) test strip, Test BS in the morning, at noon and at bedtime Dx E11.41, Disp: 300 strip, Rfl: 3   levothyroxine  (SYNTHROID ) 200 MCG tablet, TAKE 1 TABLET DAILY BEFORE BREAKFAST, Disp: 90 tablet, Rfl: 3   levothyroxine  (SYNTHROID ) 50 MCG tablet, TAKE 1 TABLET DAILY BEFORE BREAKFAST (TAKE IN ADDITIONTO 200MCG TABLET FOR TOTAL DOSE ), Disp: 90 tablet, Rfl: 1   metoprolol  tartrate (LOPRESSOR ) 100 MG tablet, TAKE (2)  TABLETS DAILY, Disp: 180 tablet, Rfl: 1   Multiple Vitamin (MULTIVITAMIN) tablet, Take 1 tablet by mouth 2 (two) times daily., Disp: , Rfl:    Prodigy Lancets 28G MISC, Check BS daily and as needed Dx E11.9, Disp: 100 each, Rfl: 3   rivaroxaban  (XARELTO ) 20 MG TABS tablet, Take 1 tablet (20 mg total) by mouth daily with supper., Disp: 90 tablet, Rfl: 1   RYBELSUS  14 MG TABS, TAKE 1 TABLET DAILY, Disp: 90 tablet, Rfl: 0   sildenafil  (VIAGRA ) 100 MG tablet, TAKE 1/2 TO 1 TABLET DAILY AS NEEDED FOR ERECTILE     DYSFUNCTION, Disp: 10 tablet, Rfl: 11   SYRINGE-NEEDLE, DISP, 3 ML 20G X 1-1/2 3 ML MISC, 1 each by Does not apply route once a week., Disp: 50 each, Rfl: 0   tamsulosin  (FLOMAX ) 0.4 MG CAPS capsule, Take 1 capsule (0.4 mg total) by mouth daily., Disp: 30 capsule, Rfl: 3   testosterone  cypionate (DEPOTESTOSTERONE CYPIONATE) 200 MG/ML injection, INJECT 0.75 ML (150 MG) INTRAMUSCULARLY EVERY 14 DAYS, Disp: 6 mL, Rfl: 1   valsartan  (DIOVAN ) 80 MG tablet, Take 1 tablet (80 mg total) by mouth daily., Disp: 90 tablet, Rfl: 1  Current Facility-Administered Medications:    testosterone  cypionate (DEPOTESTOSTERONE CYPIONATE) injection 175 mg, 175 mg, Intramuscular, Q14 Days, Severa Rock CHRISTELLA, FNP, 175 mg at 08/02/24 1449   Medications ordered in this encounter:  Meds ordered this encounter  Medications   predniSONE  (DELTASONE ) 20 MG tablet  Sig: Take 2 tablets (40 mg total) by mouth daily with breakfast for 5 days.    Dispense:  10 tablet    Refill:  0     *If you need refills on other medications prior to your next appointment, please contact your pharmacy*  Follow-Up: Call back or seek an in-person evaluation if the symptoms worsen or if the condition fails to improve as anticipated.  [facility] [phone]  Other Instructions  Be aware your blood glucose may be higher than normal on prednisone .   Follow up with Rosaline Bruns or [facility] on Monday (I suggest  [facility]).    If you have been instructed to have an in-person evaluation today at a local Urgent Care facility, please use the link below. It will take you to a list of all of our available [facility], including address, phone number and hours of operation. Please do not delay care.  [facility]  If you or a family member do not have a primary care provider, use the link below to schedule a visit and establish care. When you choose a [facility] primary care physician or advanced practice provider, you gain a long-term partner in health. Find a Primary Care Provider  Learn more about [facility]'s in-office and virtual care options: [facility] - Get Care Now

## 2024-08-11 NOTE — Progress Notes (Signed)
 Virtual Visit Consent   Richard Crawford, you are scheduled for a virtual visit with a [facility] provider today. Just as with appointments in the office, your consent must be obtained to participate. Your consent will be active for this visit and any virtual visit you may have with one of our providers in the next 365 days. If you have a MyChart account, a copy of this consent can be sent to you electronically.  As this is a virtual visit, video technology does not allow for your provider to perform a traditional examination. This may limit your provider's ability to fully assess your condition. If your provider identifies any concerns that need to be evaluated in person or the need to arrange testing (such as labs, EKG, etc.), we will make arrangements to do so. Although advances in technology are sophisticated, we cannot ensure that it will always work on either your end or our end. If the connection with a video visit is poor, the visit may have to be switched to a telephone visit. With either a video or telephone visit, we are not always able to ensure that we have a secure connection.  By engaging in this virtual visit, you consent to the provision of healthcare and authorize for your insurance to be billed (if applicable) for the services provided during this visit. Depending on your insurance coverage, you may receive a charge related to this service.  I need to obtain your verbal consent now. Are you willing to proceed with your visit today? Richard Crawford has provided verbal consent on 08/11/2024 for a virtual visit (video or telephone). Jon CHRISTELLA Belt, NP  Date: 08/11/2024 12:26 PM   Virtual Visit via Video Note   I, Jon CHRISTELLA Belt, connected with  Richard Crawford  ([id number], 12/26/1955) on 08/11/24 at 12:15 PM EDT by a video-enabled telemedicine application and verified that I am speaking with the correct person using two identifiers.  Location: Patient: Virtual Visit Location Patient:  Home Provider: Virtual Visit Location Provider: Home Office   I discussed the limitations of evaluation and management by telemedicine and the availability of in person appointments. The patient expressed understanding and agreed to proceed.    History of Present Illness: Richard Crawford is a 68 y.o. who identifies as a male who was assigned male at birth, and is being seen today for elbow pain. 4 day ago woke up and felt like elbow was hyperextended. Has been in pain since. Hard to tell over video but I think the area of most pain seems to be lateral elbow. Wife thinks R elbow maybe slightly swollen compared to L . Has tried heat, biofreeze, compression with no relief. Reduced ROM, can't fully extend elbow without pain, can't full flex elbow (as in to bring fork to mouth) wihtout pain.   Takes Duloxetine , gabapentin  for pain.   DM is well controlled, glucose stays between 125-150.   Has neck, back, and shoulder pain. Sees Dr. Kay at [facility] for shoulder pain/injections.    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 12/14/2023   Degenerative joint disease of shoulder region 11/02/2023   Rectal bleeding 09/14/2023   H/O adenomatous polyp of colon 09/14/2023   Testosterone  deficiency in male 08/26/2023   Degenerative spinal arthritis 02/19/2022   Chronic bilateral low back pain with bilateral sciatica 02/19/2022   Erectile dysfunction due to diabetes mellitus (  HCC) 01/14/2022   Vitamin D  deficiency 07/16/2021   History of papillary adenocarcinoma of thyroid  12/01/2020   Chronic right shoulder pain 08/30/2019   Diabetic mononeuropathy associated with type 2 diabetes mellitus (HCC) 06/25/2019   Hypertensive retinopathy of both eyes 03/21/2019   Permanent atrial fibrillation (HCC) 03/09/2019   Morbid obesity with BMI of 50.0-59.9, adult (HCC) 12/15/2018   History of  colonic polyps 12/15/2018   Generalized anxiety disorder 06/29/2018   Chronic heart failure with preserved ejection fraction (HCC) 05/01/2018   Hyperlipidemia associated with type 2 diabetes mellitus (HCC) 02/08/2018   Venous stasis dermatitis of both lower extremities 01/05/2018   Obstructive sleep apnea 01/05/2018   Primary osteoarthritis of left shoulder 11/15/2017   Combined forms of age-related cataract of both eyes 11/14/2017   Myopia with astigmatism and presbyopia, bilateral 11/14/2017   Hypertension associated with diabetes (HCC) 09/26/2017   Corns and callosity 09/26/2017   Postoperative hypothyroidism 09/26/2017   Type 2 diabetes mellitus with other specified complication (HCC) 09/26/2017    Allergies:  Allergies  Allergen Reactions   Morphine Nausea Only    morphine   Morphine And Codeine Nausea And Vomiting and Nausea Only   Medications:  Current Outpatient Medications:    predniSONE  (DELTASONE ) 20 MG tablet, Take 2 tablets (40 mg total) by mouth daily with breakfast for 5 days., Disp: 10 tablet, Rfl: 0   allopurinol  (ZYLOPRIM ) 100 MG tablet, TAKE 1 TABLET DAILY, Disp: 90 tablet, Rfl: 1   atorvastatin  (LIPITOR) 20 MG tablet, TAKE 1 TABLET DAILY, Disp: 90 tablet, Rfl: 1   Blood Glucose Monitoring Suppl (ONETOUCH VERIO FLEX SYSTEM) w/Device KIT, USE IN THE MORNING, AT     NOON, AND AT BEDTIME., Disp: 1 kit, Rfl: 0   DULoxetine  (CYMBALTA ) 30 MG capsule, Take 2 capsules (60 mg total) by mouth daily., Disp: 180 capsule, Rfl: 1   FARXIGA  10 MG TABS tablet, TAKE 1 TABLET DAILY BEFORE BREAKFAST, Disp: 90 tablet, Rfl: 0   gabapentin  (NEURONTIN ) 100 MG capsule, Take 1 capsule (100 mg total) by mouth 3 (three) times daily., Disp: 270 capsule, Rfl: 1   glucose blood (ONETOUCH VERIO) test strip, Test BS in the morning, at noon and at bedtime Dx E11.41, Disp: 300 strip, Rfl: 3   levothyroxine  (SYNTHROID ) 200 MCG tablet, TAKE 1 TABLET DAILY BEFORE BREAKFAST, Disp: 90 tablet, Rfl: 3    levothyroxine  (SYNTHROID ) 50 MCG tablet, TAKE 1 TABLET DAILY BEFORE BREAKFAST (TAKE IN ADDITIONTO 200MCG TABLET FOR TOTAL DOSE ), Disp: 90 tablet, Rfl: 1   metoprolol  tartrate (LOPRESSOR ) 100 MG tablet, TAKE (2) TABLETS DAILY, Disp: 180 tablet, Rfl: 1   Multiple Vitamin (MULTIVITAMIN) tablet, Take 1 tablet by mouth 2 (two) times daily., Disp: , Rfl:    Prodigy Lancets 28G MISC, Check BS daily and as needed Dx E11.9, Disp: 100 each, Rfl: 3   rivaroxaban  (XARELTO ) 20 MG TABS tablet, Take 1 tablet (20 mg total) by mouth daily with supper., Disp: 90 tablet, Rfl: 1   RYBELSUS  14 MG TABS, TAKE 1 TABLET DAILY, Disp: 90 tablet, Rfl: 0   sildenafil  (VIAGRA ) 100 MG tablet, TAKE 1/2 TO 1 TABLET DAILY AS NEEDED FOR ERECTILE     DYSFUNCTION, Disp: 10 tablet, Rfl: 11   SYRINGE-NEEDLE, DISP, 3 ML 20G X 1-1/2 3 ML MISC, 1 each by Does not apply route once a week., Disp: 50 each, Rfl: 0   tamsulosin  (FLOMAX ) 0.4 MG CAPS capsule, Take 1 capsule (0.4 mg total) by mouth daily., Disp: 30 capsule,  Rfl: 3   testosterone  cypionate (DEPOTESTOSTERONE CYPIONATE) 200 MG/ML injection, INJECT 0.75 ML (150 MG) INTRAMUSCULARLY EVERY 14 DAYS, Disp: 6 mL, Rfl: 1   valsartan  (DIOVAN ) 80 MG tablet, Take 1 tablet (80 mg total) by mouth daily., Disp: 90 tablet, Rfl: 1  Current Facility-Administered Medications:    testosterone  cypionate (DEPOTESTOSTERONE CYPIONATE) injection 175 mg, 175 mg, Intramuscular, Q14 Days, Severa Rock HERO, FNP, 175 mg at 08/02/24 1449  Observations/Objective: Patient is well-developed, well-nourished in no acute distress.  Resting comfortably  at home.  Head is normocephalic, atraumatic.  No labored breathing.  Speech is clear and coherent with logical content.  Patient is alert and oriented at baseline.  I cannot see erythema or swelling of elbow over video.   Assessment and Plan: 1. Right elbow pain (Primary)  I do not think this is bursitis and he did not experience injury that could cause  fx. I think could be either arthritis, although it is abrupt in onset, or a tendinitis. Discussed with him options for pain mgmt, as glucose well controlled, will try brief course of prednisone  and he will f/u with his ortho on Monday 08/13/24.   Follow Up Instructions: I discussed the assessment and treatment plan with the patient. The patient was provided an opportunity to ask questions and all were answered. The patient agreed with the plan and demonstrated an understanding of the instructions.  A copy of instructions were sent to the patient via MyChart unless otherwise noted below.   The patient was advised to call back or seek an in-person evaluation if the symptoms worsen or if the condition fails to improve as anticipated.    Jon HERO Belt, NP

## 2024-08-14 ENCOUNTER — Telehealth: Payer: Self-pay | Admitting: Family Medicine

## 2024-08-14 NOTE — Telephone Encounter (Unsigned)
 Copied from CRM #[id number]. Topic: Clinical - Medication Question >> Aug 14, 2024  4:07 PM Sophia H wrote: Reason for CRM: Patient states he had a urgent care video visit and was prescribed a 5 day supply of prednisone  and is wanting to know if PCP would be able to send in another round of those meds for him. States he is open to come in tomorrow but declined scheduling with me right now. States he only wants to come in if PCP requests it, please advise # [phone]  [facility] [place], [place] - [address]

## 2024-08-15 NOTE — Telephone Encounter (Signed)
 Appt scheduled for 08/16/2024

## 2024-08-16 ENCOUNTER — Ambulatory Visit: Admitting: Nurse Practitioner

## 2024-08-16 ENCOUNTER — Encounter: Payer: Self-pay | Admitting: Family Medicine

## 2024-08-16 NOTE — Progress Notes (Deleted)
 Subjective:  Patient ID: Richard Crawford, male    DOB: 1956/10/01, 68 y.o.   MRN: [id number]  Patient Care Team: Severa Rock HERO, FNP as PCP - General (Family Medicine) Lavona Agent, MD as PCP - Cardiology (Cardiology)   Chief Complaint:  No chief complaint on file.   HPI: Richard Crawford is a 68 y.o. male presenting on 08/16/2024 for No chief complaint on file.   Discussed the use of AI scribe software for clinical note transcription with the patient, who gave verbal consent to proceed.  History of Present Illness       Relevant past medical, surgical, family, and social history reviewed and updated as indicated.  Allergies and medications reviewed and updated. Data reviewed: Chart in Epic.   Past Medical History:  Diagnosis Date   Anxiety    Arthritis    left shoulder   Atrial fibrillation (HCC)    Cancer (HCC) 1989   thyroid    Chronic heart failure with preserved ejection fraction (HCC)    Diabetes mellitus without complication (HCC)    Dysrhythmia    Hemorrhoids    external   Hx of colonic polyps    Hyperlipidemia    Hypertension    Low testosterone  12/14/2020   Migraine    Papillary thyroid  carcinoma (HCC)    Postoperative hypothyroidism    Primary osteoarthritis of left shoulder    Sleep apnea    Venous stasis dermatitis of both lower extremities     Past Surgical History:  Procedure Laterality Date   ANKLE FUSION     COLONOSCOPY WITH PROPOFOL  N/A 11/07/2023   Procedure: COLONOSCOPY WITH PROPOFOL ;  Surgeon: Cindie Carlin POUR, DO;  Location: [facility];  Service: Endoscopy;  Laterality: N/A;  10:15 am, asa 3   KNEE ARTHROSCOPY     POLYPECTOMY  11/07/2023   Procedure: POLYPECTOMY;  Surgeon: Cindie Carlin POUR, DO;  Location: [facility];  Service: Endoscopy;;   REPLACEMENT TOTAL KNEE Right    SHOULDER ARTHROSCOPY     TOTAL KNEE ARTHROPLASTY Left    TOTAL SHOULDER REPLACEMENT Right     Social History   Socioeconomic History   Marital status:  Married    Spouse name: Not on file   Number of children: Not on file   Years of education: Not on file   Highest education level: 12th grade  Occupational History   Occupation: retired  Tobacco Use   Smoking status: Never   Smokeless tobacco: Never  Substance and Sexual Activity   Alcohol use: Not Currently   Drug use: Never   Sexual activity: Not on file  Other Topics Concern   Not on file  Social History Narrative   Lives at home with wife - Originally from [place] ; moved here so wife could be closer to her grandchildren (second wife)   Social Drivers of Health   Financial Resource Strain: Medium Risk (08/15/2024)   Overall Financial Resource Strain (CARDIA)    Difficulty of Paying Living Expenses: Somewhat hard  Food Insecurity: No Food Insecurity (08/15/2024)   Hunger Vital Sign    Worried About Running Out of Food in the Last Year: Never true    Ran Out of Food in the Last Year: Never true  Transportation Needs: No Transportation Needs (08/15/2024)   PRAPARE - Administrator, Civil Service (Medical): No    Lack of Transportation (Non-Medical): No  Physical Activity: Inactive (08/15/2024)   Exercise Vital Sign    Days of Exercise  per Week: 0 days    Minutes of Exercise per Session: Not on file  Stress: Stress Concern Present (08/15/2024)   [employer] of Occupational Health - Occupational Stress Questionnaire    Feeling of Stress: To some extent  Social Connections: Socially Isolated (08/15/2024)   Social Connection and Isolation Panel    Frequency of Communication with Friends and Family: Once a week    Frequency of Social Gatherings with Friends and Family: Once a week    Attends Religious Services: Never    Database Administrator or Organizations: No    Attends Engineer, Structural: Not on file    Marital Status: Married  Intimate Partner Violence: Unknown (01/14/2022)   Received from [facility]   HITS    Physically Hurt: Not on file     Insult or Talk Down To: Not on file    Threaten Physical Harm: Not on file    Scream or Curse: Not on file    Outpatient Encounter Medications as of 08/16/2024  Medication Sig   allopurinol  (ZYLOPRIM ) 100 MG tablet TAKE 1 TABLET DAILY   atorvastatin  (LIPITOR) 20 MG tablet TAKE 1 TABLET DAILY   Blood Glucose Monitoring Suppl (ONETOUCH VERIO FLEX SYSTEM) w/Device KIT USE IN THE MORNING, AT     NOON, AND AT BEDTIME.   DULoxetine  (CYMBALTA ) 30 MG capsule Take 2 capsules (60 mg total) by mouth daily.   FARXIGA  10 MG TABS tablet TAKE 1 TABLET DAILY BEFORE BREAKFAST   gabapentin  (NEURONTIN ) 100 MG capsule Take 1 capsule (100 mg total) by mouth 3 (three) times daily.   glucose blood (ONETOUCH VERIO) test strip Test BS in the morning, at noon and at bedtime Dx E11.41   levothyroxine  (SYNTHROID ) 200 MCG tablet TAKE 1 TABLET DAILY BEFORE BREAKFAST   levothyroxine  (SYNTHROID ) 50 MCG tablet TAKE 1 TABLET DAILY BEFORE BREAKFAST (TAKE IN ADDITIONTO 200MCG TABLET FOR TOTAL DOSE )   metoprolol  tartrate (LOPRESSOR ) 100 MG tablet TAKE (2) TABLETS DAILY   Multiple Vitamin (MULTIVITAMIN) tablet Take 1 tablet by mouth 2 (two) times daily.   predniSONE  (DELTASONE ) 20 MG tablet Take 2 tablets (40 mg total) by mouth daily with breakfast for 5 days.   Prodigy Lancets 28G MISC Check BS daily and as needed Dx E11.9   rivaroxaban  (XARELTO ) 20 MG TABS tablet Take 1 tablet (20 mg total) by mouth daily with supper.   RYBELSUS  14 MG TABS TAKE 1 TABLET DAILY   sildenafil  (VIAGRA ) 100 MG tablet TAKE 1/2 TO 1 TABLET DAILY AS NEEDED FOR ERECTILE     DYSFUNCTION   SYRINGE-NEEDLE, DISP, 3 ML 20G X 1-1/2 3 ML MISC 1 each by Does not apply route once a week.   tamsulosin  (FLOMAX ) 0.4 MG CAPS capsule Take 1 capsule (0.4 mg total) by mouth daily.   testosterone  cypionate (DEPOTESTOSTERONE CYPIONATE) 200 MG/ML injection INJECT 0.75 ML (150 MG) INTRAMUSCULARLY EVERY 14 DAYS   valsartan  (DIOVAN ) 80 MG tablet Take 1 tablet (80  mg total) by mouth daily.   Facility-Administered Encounter Medications as of 08/16/2024  Medication   testosterone  cypionate (DEPOTESTOSTERONE CYPIONATE) injection 175 mg    Allergies  Allergen Reactions   Morphine Nausea Only    morphine   Morphine And Codeine Nausea And Vomiting and Nausea Only    Pertinent ROS per HPI, otherwise unremarkable      Objective:  There were no vitals taken for this visit.   Wt Readings from Last 3 Encounters:  06/20/24 (!) 366 lb  3.2 oz (166.1 kg)  05/17/24 (!) 361 lb (163.7 kg)  03/15/24 (!) 358 lb 3.2 oz (162.5 kg)    Physical Exam Physical Exam      Results for orders placed or performed in visit on 06/20/24  Bayer DCA Hb A1c Waived   Collection Time: 06/20/24  3:36 PM  Result Value Ref Range   HB A1C (BAYER DCA - WAIVED) 7.1 (H) 4.8 - 5.6 %  CBC with Differential/Platelet   Collection Time: 06/20/24  3:38 PM  Result Value Ref Range   WBC 10.1 3.4 - 10.8 x10E3/uL   RBC 6.00 (H) 4.14 - 5.80 x10E6/uL   Hemoglobin 16.6 13.0 - 17.7 g/dL   Hematocrit 47.2 (H) 62.4 - 51.0 %   MCV 88 79 - 97 fL   MCH 27.7 26.6 - 33.0 pg   MCHC 31.5 31.5 - 35.7 g/dL   RDW 86.2 88.3 - 84.5 %   Platelets 172 150 - 450 x10E3/uL   Neutrophils 52 Not Estab. %   Lymphs 30 Not Estab. %   Monocytes 16 Not Estab. %   Eos 1 Not Estab. %   Basos 1 Not Estab. %   Neutrophils Absolute 5.3 1.4 - 7.0 x10E3/uL   Lymphocytes Absolute 3.0 0.7 - 3.1 x10E3/uL   Monocytes Absolute 1.6 (H) 0.1 - 0.9 x10E3/uL   EOS (ABSOLUTE) 0.1 0.0 - 0.4 x10E3/uL   Basophils Absolute 0.1 0.0 - 0.2 x10E3/uL   Immature Granulocytes 0 Not Estab. %   Immature Grans (Abs) 0.0 0.0 - 0.1 x10E3/uL  CMP14+EGFR   Collection Time: 06/20/24  3:38 PM  Result Value Ref Range   Glucose 121 (H) 70 - 99 mg/dL   BUN 23 8 - 27 mg/dL   Creatinine, Ser 8.57 (H) 0.76 - 1.27 mg/dL   eGFR 53 (L) >40 fO/fpw/8.26   BUN/Creatinine Ratio 16 10 - 24   Sodium 144 134 - 144 mmol/L   Potassium 4.7 3.5 -  5.2 mmol/L   Chloride 104 96 - 106 mmol/L   CO2 23 20 - 29 mmol/L   Calcium  8.7 8.6 - 10.2 mg/dL   Total Protein 7.2 6.0 - 8.5 g/dL   Albumin 4.1 3.9 - 4.9 g/dL   Globulin, Total 3.1 1.5 - 4.5 g/dL   Bilirubin Total 1.0 0.0 - 1.2 mg/dL   Alkaline Phosphatase 176 (H) 44 - 121 IU/L   AST 38 0 - 40 IU/L   ALT 58 (H) 0 - 44 IU/L  Lipid panel   Collection Time: 06/20/24  3:38 PM  Result Value Ref Range   Cholesterol, Total 100 100 - 199 mg/dL   Triglycerides 898 0 - 149 mg/dL   HDL 36 (L) >60 mg/dL   VLDL Cholesterol Cal 19 5 - 40 mg/dL   LDL Chol Calc (NIH) 45 0 - 99 mg/dL   Chol/HDL Ratio 2.8 0.0 - 5.0 ratio       Pertinent labs & imaging results that were available during my care of the patient were reviewed by me and considered in my medical decision making.  Assessment & Plan:  There are no diagnoses linked to this encounter.   Assessment and Plan Assessment & Plan       Continue all other maintenance medications.  Follow up plan: No follow-ups on file.   Continue healthy lifestyle choices, including diet (rich in fruits, vegetables, and lean proteins, and low in salt and simple carbohydrates) and exercise (at least 30 minutes of moderate physical activity daily).  Educational handout given for ***  The above assessment and management plan was discussed with the patient. The patient verbalized understanding of and has agreed to the management plan. Patient is aware to call the clinic if they develop any new symptoms or if symptoms persist or worsen. Patient is aware when to return to the clinic for a follow-up visit. Patient educated on when it is appropriate to go to the emergency department.   Parthena Fergeson [place] Thompson, DNP [facility] [address] [place], [place] [address] [phone]

## 2024-08-17 ENCOUNTER — Ambulatory Visit: Admitting: Family Medicine

## 2024-08-17 ENCOUNTER — Encounter: Payer: Self-pay | Admitting: Family Medicine

## 2024-08-17 VITALS — BP 134/85 | HR 69 | Temp 97.4°F | Ht 75.0 in | Wt 359.0 lb

## 2024-08-17 DIAGNOSIS — E291 Testicular hypofunction: Secondary | ICD-10-CM | POA: Diagnosis not present

## 2024-08-17 DIAGNOSIS — M7711 Lateral epicondylitis, right elbow: Secondary | ICD-10-CM

## 2024-08-17 DIAGNOSIS — R432 Parageusia: Secondary | ICD-10-CM

## 2024-08-17 DIAGNOSIS — E1169 Type 2 diabetes mellitus with other specified complication: Secondary | ICD-10-CM

## 2024-08-17 DIAGNOSIS — M25521 Pain in right elbow: Secondary | ICD-10-CM | POA: Diagnosis not present

## 2024-08-17 MED ORDER — TESTOSTERONE CYPIONATE 200 MG/ML IM SOLN
INTRAMUSCULAR | 1 refills | Status: DC
Start: 2024-08-17 — End: 2024-08-23

## 2024-08-17 MED ORDER — PREDNISONE 10 MG (21) PO TBPK: ORAL_TABLET | ORAL | 0 refills | Status: DC

## 2024-08-17 NOTE — Patient Instructions (Signed)
 Patient Relations and Grievance Department may be reached at [phone] You may also email directly at [email or web address]

## 2024-08-17 NOTE — Progress Notes (Signed)
 Subjective:  Patient ID: Richard Crawford, male    DOB: 1955-12-23, 68 y.o.   MRN: [id number]  Patient Care Team: Severa Rock HERO, FNP as PCP - General (Family Medicine) Lavona Agent, MD as PCP - Cardiology (Cardiology)   Chief Complaint:  Elbow Pain (Right- did video visit 11/1 and still bother patient. )   HPI: [name] is a 68 y.o. male presenting on 08/17/2024 for Elbow Pain (Right- did video visit 11/1 and still bother patient. )   [name] is a 68 year old male who presents with a request for a prednisone  refill and evaluation of elbow pain.  Right elbow pain and functional limitation - Severe right elbow pain, initially attributed to hyperextension - Pain described as 'killing me' - Inability to fully bend or straighten the right elbow - Functional impairment affecting daily activities, including brushing teeth and touching face - Initial diagnosis of tennis elbow during a video visit with urgent care - No prior injections for this condition - Use of heat, Biofreeze, and compression with minimal relief  Response to prednisone  therapy - Prednisone  prescribed by urgent care provider resulted in significant pain relief - Unable to obtain a refill despite multiple attempts to contact primary care provider's office and pharmacy - Frustration with communication and scheduling issues related to medication access  Dysgeusia and gastrointestinal symptoms - Intermittent metallic ('rusty') taste in the mouth - Taste persists despite use of toothpaste and gum - Occasionally followed by stomach upset  Glycemic control - No significant changes in blood glucose levels - Blood glucose readings consistently between 125 and 150 mg/dL - No symptoms of anemia          Relevant past medical, surgical, family, and social history reviewed and updated as indicated.  Allergies and medications reviewed and updated. Data reviewed: Chart in Epic.   Past Medical  History:  Diagnosis Date   Anxiety    Arthritis    left shoulder   Atrial fibrillation (HCC)    Cancer (HCC) 1989   thyroid    Chronic heart failure with preserved ejection fraction (HCC)    Diabetes mellitus without complication (HCC)    Dysrhythmia    Hemorrhoids    external   Hx of colonic polyps    Hyperlipidemia    Hypertension    Low testosterone  12/14/2020   Migraine    Papillary thyroid  carcinoma (HCC)    Postoperative hypothyroidism    Primary osteoarthritis of left shoulder    Sleep apnea    Venous stasis dermatitis of both lower extremities     Past Surgical History:  Procedure Laterality Date   ANKLE FUSION     COLONOSCOPY WITH PROPOFOL  N/A 11/07/2023   Procedure: COLONOSCOPY WITH PROPOFOL ;  Surgeon: Cindie Carlin POUR, DO;  Location: [facility];  Service: Endoscopy;  Laterality: N/A;  10:15 am, asa 3   KNEE ARTHROSCOPY     POLYPECTOMY  11/07/2023   Procedure: POLYPECTOMY;  Surgeon: Cindie Carlin POUR, DO;  Location: [facility];  Service: Endoscopy;;   REPLACEMENT TOTAL KNEE Right    SHOULDER ARTHROSCOPY     TOTAL KNEE ARTHROPLASTY Left    TOTAL SHOULDER REPLACEMENT Right     Social History   Socioeconomic History   Marital status: Married    Spouse name: Not on file   Number of children: Not on file   Years of education: Not on file   Highest education level: 12th grade  Occupational History   Occupation:  retired  Tobacco Use   Smoking status: Never   Smokeless tobacco: Never  Substance and Sexual Activity   Alcohol use: Not Currently   Drug use: Never   Sexual activity: Not on file  Other Topics Concern   Not on file  Social History Narrative   Lives at home with wife - Originally from [place] ; moved here so wife could be closer to her grandchildren (second wife)   Social Drivers of Corporate Investment Banker Strain: Medium Risk (08/15/2024)   Overall Financial Resource Strain (CARDIA)    Difficulty of Paying Living Expenses:  Somewhat hard  Food Insecurity: No Food Insecurity (08/15/2024)   Hunger Vital Sign    Worried About Running Out of Food in the Last Year: Never true    Ran Out of Food in the Last Year: Never true  Transportation Needs: No Transportation Needs (08/15/2024)   PRAPARE - Administrator, Civil Service (Medical): No    Lack of Transportation (Non-Medical): No  Physical Activity: Inactive (08/15/2024)   Exercise Vital Sign    Days of Exercise per Week: 0 days    Minutes of Exercise per Session: Not on file  Stress: Stress Concern Present (08/15/2024)   [employer] of Occupational Health - Occupational Stress Questionnaire    Feeling of Stress: To some extent  Social Connections: Socially Isolated (08/15/2024)   Social Connection and Isolation Panel    Frequency of Communication with Friends and Family: Once a week    Frequency of Social Gatherings with Friends and Family: Once a week    Attends Religious Services: Never    Database Administrator or Organizations: No    Attends Engineer, Structural: Not on file    Marital Status: Married  Intimate Partner Violence: Unknown (01/14/2022)   Received from [facility]   HITS    Physically Hurt: Not on file    Insult or Talk Down To: Not on file    Threaten Physical Harm: Not on file    Scream or Curse: Not on file    Outpatient Encounter Medications as of 08/17/2024  Medication Sig   allopurinol  (ZYLOPRIM ) 100 MG tablet TAKE 1 TABLET DAILY   atorvastatin  (LIPITOR) 20 MG tablet TAKE 1 TABLET DAILY   Blood Glucose Monitoring Suppl (ONETOUCH VERIO FLEX SYSTEM) w/Device KIT USE IN THE MORNING, AT     NOON, AND AT BEDTIME.   DULoxetine  (CYMBALTA ) 30 MG capsule Take 2 capsules (60 mg total) by mouth daily.   FARXIGA  10 MG TABS tablet TAKE 1 TABLET DAILY BEFORE BREAKFAST   gabapentin  (NEURONTIN ) 100 MG capsule Take 1 capsule (100 mg total) by mouth 3 (three) times daily.   glucose blood (ONETOUCH VERIO) test strip Test BS  in the morning, at noon and at bedtime Dx E11.41   levothyroxine  (SYNTHROID ) 200 MCG tablet TAKE 1 TABLET DAILY BEFORE BREAKFAST   levothyroxine  (SYNTHROID ) 50 MCG tablet TAKE 1 TABLET DAILY BEFORE BREAKFAST (TAKE IN ADDITIONTO 200MCG TABLET FOR TOTAL DOSE )   metoprolol  tartrate (LOPRESSOR ) 100 MG tablet TAKE (2) TABLETS DAILY   Multiple Vitamin (MULTIVITAMIN) tablet Take 1 tablet by mouth 2 (two) times daily.   predniSONE  (STERAPRED UNI-PAK 21 TAB) 10 MG (21) TBPK tablet Use as directed on back of pill pack   Prodigy Lancets 28G MISC Check BS daily and as needed Dx E11.9   rivaroxaban  (XARELTO ) 20 MG TABS tablet Take 1 tablet (20 mg total) by mouth daily with supper.  RYBELSUS  14 MG TABS TAKE 1 TABLET DAILY   sildenafil  (VIAGRA ) 100 MG tablet TAKE 1/2 TO 1 TABLET DAILY AS NEEDED FOR ERECTILE     DYSFUNCTION   SYRINGE-NEEDLE, DISP, 3 ML 20G X 1-1/2 3 ML MISC 1 each by Does not apply route once a week.   tamsulosin  (FLOMAX ) 0.4 MG CAPS capsule Take 1 capsule (0.4 mg total) by mouth daily.   valsartan  (DIOVAN ) 80 MG tablet Take 1 tablet (80 mg total) by mouth daily.   [DISCONTINUED] testosterone  cypionate (DEPOTESTOSTERONE CYPIONATE) 200 MG/ML injection INJECT 0.75 ML (150 MG) INTRAMUSCULARLY EVERY 14 DAYS   testosterone  cypionate (DEPOTESTOSTERONE CYPIONATE) 200 MG/ML injection INJECT 0.75 ML (150 MG) INTRAMUSCULARLY EVERY 14 DAYS   Facility-Administered Encounter Medications as of 08/17/2024  Medication   testosterone  cypionate (DEPOTESTOSTERONE CYPIONATE) injection 175 mg    Allergies  Allergen Reactions   Morphine Nausea Only    morphine   Morphine And Codeine Nausea And Vomiting and Nausea Only    Pertinent ROS per HPI, otherwise unremarkable      Objective:  BP 134/85   Pulse 69   Temp (!) 97.4 F (36.3 C)   Ht 6' 3 (1.905 m)   Wt (!) 359 lb (162.8 kg)   SpO2 99%   BMI 44.87 kg/m    Wt Readings from Last 3 Encounters:  08/17/24 (!) 359 lb (162.8 kg)  06/20/24  (!) 366 lb 3.2 oz (166.1 kg)  05/17/24 (!) 361 lb (163.7 kg)    Physical Exam Vitals and nursing note reviewed.  Constitutional:      General: He is not in acute distress.    Appearance: Normal appearance. He is morbidly obese. He is not toxic-appearing or diaphoretic.  HENT:     Head: Normocephalic and atraumatic.     Mouth/Throat:     Mouth: Mucous membranes are moist.  Eyes:     Conjunctiva/sclera: Conjunctivae normal.     Pupils: Pupils are equal, round, and reactive to light.     Comments: Has on dark sunglasses   Cardiovascular:     Rate and Rhythm: Normal rate. Rhythm irregularly irregular.  Pulmonary:     Effort: Pulmonary effort is normal.     Breath sounds: Normal breath sounds.  Musculoskeletal:     Right elbow: No swelling, deformity, effusion or lacerations. Decreased range of motion. No tenderness. No medial epicondyle or olecranon process tenderness.       Arms:     Right lower leg: Edema present.     Left lower leg: Edema present.  Skin:    General: Skin is warm and dry.     Capillary Refill: Capillary refill takes less than 2 seconds.  Neurological:     General: No focal deficit present.     Mental Status: He is alert and oriented to person, place, and time.  Psychiatric:        Mood and Affect: Mood normal.        Behavior: Behavior normal. Behavior is cooperative.        Thought Content: Thought content normal.        Judgment: Judgment normal.      Results for orders placed or performed in visit on 06/20/24  Bayer DCA Hb A1c Waived   Collection Time: 06/20/24  3:36 PM  Result Value Ref Range   HB A1C (BAYER DCA - WAIVED) 7.1 (H) 4.8 - 5.6 %  CBC with Differential/Platelet   Collection Time: 06/20/24  3:38 PM  Result Value  Ref Range   WBC 10.1 3.4 - 10.8 x10E3/uL   RBC 6.00 (H) 4.14 - 5.80 x10E6/uL   Hemoglobin 16.6 13.0 - 17.7 g/dL   Hematocrit 47.2 (H) 62.4 - 51.0 %   MCV 88 79 - 97 fL   MCH 27.7 26.6 - 33.0 pg   MCHC 31.5 31.5 - 35.7 g/dL    RDW 86.2 88.3 - 84.5 %   Platelets 172 150 - 450 x10E3/uL   Neutrophils 52 Not Estab. %   Lymphs 30 Not Estab. %   Monocytes 16 Not Estab. %   Eos 1 Not Estab. %   Basos 1 Not Estab. %   Neutrophils Absolute 5.3 1.4 - 7.0 x10E3/uL   Lymphocytes Absolute 3.0 0.7 - 3.1 x10E3/uL   Monocytes Absolute 1.6 (H) 0.1 - 0.9 x10E3/uL   EOS (ABSOLUTE) 0.1 0.0 - 0.4 x10E3/uL   Basophils Absolute 0.1 0.0 - 0.2 x10E3/uL   Immature Granulocytes 0 Not Estab. %   Immature Grans (Abs) 0.0 0.0 - 0.1 x10E3/uL  CMP14+EGFR   Collection Time: 06/20/24  3:38 PM  Result Value Ref Range   Glucose 121 (H) 70 - 99 mg/dL   BUN 23 8 - 27 mg/dL   Creatinine, Ser 8.57 (H) 0.76 - 1.27 mg/dL   eGFR 53 (L) >40 fO/fpw/8.26   BUN/Creatinine Ratio 16 10 - 24   Sodium 144 134 - 144 mmol/L   Potassium 4.7 3.5 - 5.2 mmol/L   Chloride 104 96 - 106 mmol/L   CO2 23 20 - 29 mmol/L   Calcium  8.7 8.6 - 10.2 mg/dL   Total Protein 7.2 6.0 - 8.5 g/dL   Albumin 4.1 3.9 - 4.9 g/dL   Globulin, Total 3.1 1.5 - 4.5 g/dL   Bilirubin Total 1.0 0.0 - 1.2 mg/dL   Alkaline Phosphatase 176 (H) 44 - 121 IU/L   AST 38 0 - 40 IU/L   ALT 58 (H) 0 - 44 IU/L  Lipid panel   Collection Time: 06/20/24  3:38 PM  Result Value Ref Range   Cholesterol, Total 100 100 - 199 mg/dL   Triglycerides 898 0 - 149 mg/dL   HDL 36 (L) >60 mg/dL   VLDL Cholesterol Cal 19 5 - 40 mg/dL   LDL Chol Calc (NIH) 45 0 - 99 mg/dL   Chol/HDL Ratio 2.8 0.0 - 5.0 ratio       Pertinent labs & imaging results that were available during my care of the patient were reviewed by me and considered in my medical decision making.  Assessment & Plan:  Hawkins Seaman was seen today for elbow pain.  Diagnoses and all orders for this visit:  Right elbow pain -     predniSONE  (STERAPRED UNI-PAK 21 TAB) 10 MG (21) TBPK tablet; Use as directed on back of pill pack  Lateral epicondylitis of right elbow -     predniSONE  (STERAPRED UNI-PAK 21 TAB) 10 MG (21) TBPK tablet;  Use as directed on back of pill pack  Testosterone  deficiency in male Injection given in office today  -     testosterone  cypionate (DEPOTESTOSTERONE CYPIONATE) 200 MG/ML injection; INJECT 0.75 ML (150 MG) INTRAMUSCULARLY EVERY 14 DAYS  Dysgeusia Discussed possible causes  Type 2 diabetes mellitus with other specified complication, without long-term current use of insulin  (HCC) Monitor BS more frequently while taking prednisone        Lateral epicondylitis (tennis elbow), right elbow with associated pain 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 Previous  oral steroids provided only temporary relief. Current symptoms include inability to fully bend or straighten the elbow, and tenderness on the lateral side of the elbow. Bruising present, likely from minor trauma. Differential diagnosis includes golfer's elbow, but symptoms and location are consistent with tennis elbow. - Prescribed a longer taper of prednisone . - Advised use of compression, rest, ice, and elevation for symptom management. - Instructed to monitor blood sugar levels due to steroid use. - Educated on the importance of checking blood sugar when experiencing metallic taste.          Continue all other maintenance medications.  Follow up plan: Return if symptoms worsen or fail to improve.   Continue healthy lifestyle choices, including diet (rich in fruits, vegetables, and lean proteins, and low in salt and simple carbohydrates) and exercise (at least 30 minutes of moderate physical activity daily).  Educational handout given for tennis elbow  The above assessment and management plan was discussed with the patient. The patient verbalized understanding of and has agreed to the management plan. Patient is aware to call the clinic if they develop any new symptoms or if symptoms persist or worsen. Patient is aware when to return to the clinic for a follow-up visit. Patient educated on  when it is appropriate to go to the emergency department.   Rosaline Bruns, FNP-C [facility] [phone]

## 2024-08-20 ENCOUNTER — Ambulatory Visit: Admitting: Urology

## 2024-08-22 ENCOUNTER — Encounter: Payer: Self-pay | Admitting: Family Medicine

## 2024-08-22 DIAGNOSIS — E1169 Type 2 diabetes mellitus with other specified complication: Secondary | ICD-10-CM

## 2024-08-22 DIAGNOSIS — E291 Testicular hypofunction: Secondary | ICD-10-CM

## 2024-08-23 MED ORDER — TESTOSTERONE CYPIONATE 200 MG/ML IM SOLN: INTRAMUSCULAR | 1 refills | Status: AC

## 2024-08-25 ENCOUNTER — Other Ambulatory Visit: Payer: Self-pay | Admitting: *Deleted

## 2024-08-25 DIAGNOSIS — E1169 Type 2 diabetes mellitus with other specified complication: Secondary | ICD-10-CM

## 2024-08-30 ENCOUNTER — Ambulatory Visit (INDEPENDENT_AMBULATORY_CARE_PROVIDER_SITE_OTHER): Admitting: *Deleted

## 2024-08-30 DIAGNOSIS — E291 Testicular hypofunction: Secondary | ICD-10-CM | POA: Diagnosis not present

## 2024-08-30 NOTE — Progress Notes (Signed)
 Patient is in office today for a nurse visit for Testosterone Injection. Patient Injection was given in the  Right vastus lateralis. Patient tolerated injection well.

## 2024-08-31 ENCOUNTER — Other Ambulatory Visit: Payer: Self-pay | Admitting: Family Medicine

## 2024-08-31 DIAGNOSIS — I4821 Permanent atrial fibrillation: Secondary | ICD-10-CM

## 2024-09-13 ENCOUNTER — Ambulatory Visit (INDEPENDENT_AMBULATORY_CARE_PROVIDER_SITE_OTHER): Admitting: *Deleted

## 2024-09-13 DIAGNOSIS — E291 Testicular hypofunction: Secondary | ICD-10-CM | POA: Diagnosis not present

## 2024-09-13 NOTE — Progress Notes (Signed)
 Patient is in office today for a nurse visit for Testosterone Injection. Patient Injection was given in the  Right vastus lateralis. Patient tolerated injection well.

## 2024-09-19 ENCOUNTER — Encounter: Payer: Self-pay | Admitting: Family Medicine

## 2024-09-19 ENCOUNTER — Ambulatory Visit: Payer: Self-pay | Admitting: Family Medicine

## 2024-09-19 ENCOUNTER — Ambulatory Visit

## 2024-09-19 ENCOUNTER — Other Ambulatory Visit: Payer: Self-pay | Admitting: Family Medicine

## 2024-09-19 VITALS — BP 118/71 | HR 91 | Temp 97.5°F | Ht 75.0 in | Wt 366.6 lb

## 2024-09-19 DIAGNOSIS — M25572 Pain in left ankle and joints of left foot: Secondary | ICD-10-CM | POA: Diagnosis not present

## 2024-09-19 DIAGNOSIS — I5032 Chronic diastolic (congestive) heart failure: Secondary | ICD-10-CM | POA: Diagnosis not present

## 2024-09-19 DIAGNOSIS — I4821 Permanent atrial fibrillation: Secondary | ICD-10-CM | POA: Diagnosis not present

## 2024-09-19 DIAGNOSIS — G8929 Other chronic pain: Secondary | ICD-10-CM

## 2024-09-19 DIAGNOSIS — R3915 Urgency of urination: Secondary | ICD-10-CM

## 2024-09-19 DIAGNOSIS — M5441 Lumbago with sciatica, right side: Secondary | ICD-10-CM | POA: Diagnosis not present

## 2024-09-19 DIAGNOSIS — F411 Generalized anxiety disorder: Secondary | ICD-10-CM

## 2024-09-19 DIAGNOSIS — E1159 Type 2 diabetes mellitus with other circulatory complications: Secondary | ICD-10-CM | POA: Diagnosis not present

## 2024-09-19 DIAGNOSIS — M25562 Pain in left knee: Secondary | ICD-10-CM

## 2024-09-19 DIAGNOSIS — I152 Hypertension secondary to endocrine disorders: Secondary | ICD-10-CM

## 2024-09-19 DIAGNOSIS — E291 Testicular hypofunction: Secondary | ICD-10-CM

## 2024-09-19 DIAGNOSIS — E1169 Type 2 diabetes mellitus with other specified complication: Secondary | ICD-10-CM | POA: Diagnosis not present

## 2024-09-19 DIAGNOSIS — Z6841 Body Mass Index (BMI) 40.0 and over, adult: Secondary | ICD-10-CM

## 2024-09-19 DIAGNOSIS — E1141 Type 2 diabetes mellitus with diabetic mononeuropathy: Secondary | ICD-10-CM

## 2024-09-19 LAB — BAYER DCA HB A1C WAIVED: HB A1C (BAYER DCA - WAIVED): 7.8 % — ABNORMAL HIGH (ref 4.8–5.6)

## 2024-09-19 MED ORDER — TAMSULOSIN HCL 0.4 MG PO CAPS: 0.4000 mg | ORAL_CAPSULE | Freq: Every day | ORAL | 3 refills | Status: AC

## 2024-09-19 MED ORDER — DAPAGLIFLOZIN PROPANEDIOL 10 MG PO TABS: 10.0000 mg | ORAL_TABLET | Freq: Every day | ORAL | 3 refills | Status: DC

## 2024-09-19 MED ORDER — DULOXETINE HCL 30 MG PO CPEP: 60.0000 mg | ORAL_CAPSULE | Freq: Every day | ORAL | 3 refills | Status: AC

## 2024-09-19 NOTE — Progress Notes (Signed)
 Subjective:  Patient ID: Richard Crawford, male    DOB: May 26, 1956, 68 y.o.   MRN: [id number]  Patient Care Team: Severa Rock HERO, FNP as PCP - General (Family Medicine) Lavona Agent, MD as PCP - Cardiology (Cardiology)   Chief Complaint:  Diabetes (3 month follow up ) and Knee Pain (Left - heard a pop Saturday after getting out of the chair )   HPI: [name] is a 68 y.o. male presenting on 09/19/2024 for Diabetes (3 month follow up ) and Knee Pain (Left - heard a pop Saturday after getting out of the chair )   [name] is a 68 year old male with a history of bilateral knee replacements who presents with knee pain and difficulty bending the knee.  He experienced a popping sensation in his knee on the medial side after getting up from a chair. This occurred on a Saturday, and the following day he was unable to straighten or bend the knee, which prevented him from participating in activities with his grandchildren. Over the subsequent days, the knee improved slightly each day, allowing him to drive. He has been using a topical gel provided by a coworker of his wife, which he finds more effective than Biofreeze.  He has a history of diabetes, which he reports is 'way out of whack' due to two rounds of prednisone  for elbow pain. He has been experiencing significant pain in his shoulder and elbow, for which he has been taking Tylenol  Arthritis. He anticipates his A1c to be elevated, although it has not been that high in the past.  He mentions experiencing intermittent shortness of breath. He reports experiencing shortness of breath and describes 'huffing and puffing' when active, and has previously asked about his heart function and muscle tightness. This is chronic and has not worsened. His last echo revealed a normal EF.   He has run out of his Flomax  and requires a refill. States this helps greatly with his urgency.   He has a history of missed appointments and received a  letter from [facility] regarding potential dismissal from their care due to missed appointments. He attributes this to communication issues with their call service.          Relevant past medical, surgical, family, and social history reviewed and updated as indicated.  Allergies and medications reviewed and updated. Data reviewed: Chart in Epic.   Past Medical History:  Diagnosis Date   Anxiety    Arthritis    left shoulder   Atrial fibrillation (HCC)    Cancer (HCC) 1989   thyroid    Chronic heart failure with preserved ejection fraction (HCC)    Diabetes mellitus without complication (HCC)    Dysrhythmia    Hemorrhoids    external   Hx of colonic polyps    Hyperlipidemia    Hypertension    Low testosterone  12/14/2020   Migraine    Papillary thyroid  carcinoma (HCC)    Postoperative hypothyroidism    Primary osteoarthritis of left shoulder    Sleep apnea    Venous stasis dermatitis of both lower extremities     Past Surgical History:  Procedure Laterality Date   ANKLE FUSION     COLONOSCOPY WITH PROPOFOL  N/A 11/07/2023   Procedure: COLONOSCOPY WITH PROPOFOL ;  Surgeon: Cindie Carlin POUR, DO;  [facility];  Service: Endoscopy;  Laterality: N/A;  10:15 am, asa 3   KNEE ARTHROSCOPY     POLYPECTOMY  11/07/2023  Procedure: POLYPECTOMY;  Surgeon: Cindie Carlin POUR, DO;  [facility];  Service: Endoscopy;;   REPLACEMENT TOTAL KNEE Right    SHOULDER ARTHROSCOPY     TOTAL KNEE ARTHROPLASTY Left    TOTAL SHOULDER REPLACEMENT Right     Social History   Socioeconomic History   Marital status: Married    Spouse name: Not on file   Number of children: Not on file   Years of education: Not on file   Highest education level: 12th grade  Occupational History   Occupation: retired  Tobacco Use   Smoking status: Never   Smokeless tobacco: Never  Substance and Sexual Activity   Alcohol use: Not Currently   Drug use: Never   Sexual activity: Not  on file  Other Topics Concern   Not on file  Social History Narrative   Lives at home with wife - Originally from [place] ; moved here so wife could be closer to her grandchildren (second wife)   Social Drivers of Corporate Investment Banker Strain: Medium Risk (08/15/2024)   Overall Financial Resource Strain (CARDIA)    Difficulty of Paying Living Expenses: Somewhat hard  Food Insecurity: No Food Insecurity (08/15/2024)   Hunger Vital Sign    Worried About Running Out of Food in the Last Year: Never true    Ran Out of Food in the Last Year: Never true  Transportation Needs: No Transportation Needs (08/15/2024)   PRAPARE - Administrator, Civil Service (Medical): No    Lack of Transportation (Non-Medical): No  Physical Activity: Inactive (08/15/2024)   Exercise Vital Sign    Days of Exercise per Week: 0 days    Minutes of Exercise per Session: Not on file  Stress: Stress Concern Present (08/15/2024)   [employer] of Occupational Health - Occupational Stress Questionnaire    Feeling of Stress: To some extent  Social Connections: Socially Isolated (08/15/2024)   Social Connection and Isolation Panel    Frequency of Communication with Friends and Family: Once a week    Frequency of Social Gatherings with Friends and Family: Once a week    Attends Religious Services: Never    Database Administrator or Organizations: No    Attends Engineer, Structural: Not on file    Marital Status: Married  Catering Manager Violence: Not At Risk (03/03/2021)   Humiliation, Afraid, Rape, and Kick questionnaire    Fear of Current or Ex-Partner: No    Emotionally Abused: No    Physically Abused: No    Sexually Abused: No    Outpatient Encounter Medications as of 09/19/2024  Medication Sig   allopurinol  (ZYLOPRIM ) 100 MG tablet TAKE 1 TABLET DAILY   atorvastatin  (LIPITOR) 20 MG tablet TAKE 1 TABLET DAILY   Blood Glucose Monitoring Suppl (ONETOUCH VERIO FLEX SYSTEM) w/Device  KIT USE IN THE MORNING, AT     NOON, AND AT BEDTIME.   gabapentin  (NEURONTIN ) 100 MG capsule Take 1 capsule (100 mg total) by mouth 3 (three) times daily.   glucose blood (ONETOUCH VERIO) test strip Test BS in the morning, at noon and at bedtime Dx E11.41   levothyroxine  (SYNTHROID ) 200 MCG tablet TAKE 1 TABLET DAILY BEFORE BREAKFAST   levothyroxine  (SYNTHROID ) 50 MCG tablet TAKE 1 TABLET DAILY BEFORE BREAKFAST (TAKE IN ADDITIONTO 200MCG TABLET FOR TOTAL DOSE )   metoprolol  tartrate (LOPRESSOR ) 100 MG tablet TAKE (2) TABLETS DAILY   Multiple Vitamin (MULTIVITAMIN) tablet Take 1 tablet by mouth 2 (two)  times daily.   Prodigy Lancets 28G MISC Check BS daily and as needed Dx E11.9   RYBELSUS  14 MG TABS TAKE 1 TABLET DAILY   sildenafil  (VIAGRA ) 100 MG tablet TAKE 1/2 TO 1 TABLET DAILY AS NEEDED FOR ERECTILE     DYSFUNCTION   SYRINGE-NEEDLE, DISP, 3 ML 20G X 1-1/2 3 ML MISC 1 each by Does not apply route once a week.   testosterone  cypionate (DEPOTESTOSTERONE CYPIONATE) 200 MG/ML injection INJECT 0.75 ML (150 MG) INTRAMUSCULARLY EVERY 14 DAYS   valsartan  (DIOVAN ) 80 MG tablet Take 1 tablet (80 mg total) by mouth daily.   XARELTO  20 MG TABS tablet TAKE 1 TABLET DAILY WITH   SUPPER   [DISCONTINUED] predniSONE  (STERAPRED UNI-PAK 21 TAB) 10 MG (21) TBPK tablet Use as directed on back of pill pack   [DISCONTINUED] tamsulosin  (FLOMAX ) 0.4 MG CAPS capsule Take 1 capsule (0.4 mg total) by mouth daily.   dapagliflozin  propanediol (FARXIGA ) 10 MG TABS tablet Take 1 tablet (10 mg total) by mouth daily before breakfast.   DULoxetine  (CYMBALTA ) 30 MG capsule Take 2 capsules (60 mg total) by mouth daily.   tamsulosin  (FLOMAX ) 0.4 MG CAPS capsule Take 1 capsule (0.4 mg total) by mouth daily.   [DISCONTINUED] DULoxetine  (CYMBALTA ) 30 MG capsule Take 2 capsules (60 mg total) by mouth daily.   [DISCONTINUED] FARXIGA  10 MG TABS tablet TAKE 1 TABLET DAILY BEFORE BREAKFAST   Facility-Administered Encounter  Medications as of 09/19/2024  Medication   testosterone  cypionate (DEPOTESTOSTERONE CYPIONATE) injection 175 mg    Allergies  Allergen Reactions   Morphine Nausea Only    morphine   Morphine And Codeine Nausea And Vomiting and Nausea Only    Pertinent ROS per HPI, otherwise unremarkable      Objective:  BP 118/71   Pulse 91   Temp (!) 97.5 F (36.4 C)   Ht 6' 3 (1.905 m)   Wt (!) 366 lb 9.6 oz (166.3 kg)   SpO2 97%   BMI 45.82 kg/m    Wt Readings from Last 3 Encounters:  09/19/24 (!) 366 lb 9.6 oz (166.3 kg)  08/17/24 (!) 359 lb (162.8 kg)  06/20/24 (!) 366 lb 3.2 oz (166.1 kg)    Physical Exam Vitals and nursing note reviewed.  Constitutional:      Appearance: Normal appearance. He is morbidly obese.  HENT:     Head: Normocephalic and atraumatic.     Mouth/Throat:     Mouth: Mucous membranes are moist.  Eyes:     Pupils: Pupils are equal, round, and reactive to light.  Cardiovascular:     Rate and Rhythm: Normal rate.  Pulmonary:     Effort: Pulmonary effort is normal.  Musculoskeletal:     Cervical back: Neck supple.     Right lower leg: Edema present.     Left lower leg: Edema present.       Legs:  Skin:    General: Skin is warm and dry.     Capillary Refill: Capillary refill takes less than 2 seconds.  Neurological:     General: No focal deficit present.     Mental Status: He is alert and oriented to person, place, and time.     Gait: Gait abnormal (antalgic, using cane).  Psychiatric:        Mood and Affect: Mood normal.        Behavior: Behavior normal. Behavior is cooperative.        Thought Content: Thought content normal.  Judgment: Judgment normal.      Results for orders placed or performed in visit on 06/20/24  Bayer DCA Hb A1c Waived   Collection Time: 06/20/24  3:36 PM  Result Value Ref Range   HB A1C (BAYER DCA - WAIVED) 7.1 (H) 4.8 - 5.6 %  CBC with Differential/Platelet   Collection Time: 06/20/24  3:38 PM  Result  Value Ref Range   WBC 10.1 3.4 - 10.8 x10E3/uL   RBC 6.00 (H) 4.14 - 5.80 x10E6/uL   Hemoglobin 16.6 13.0 - 17.7 g/dL   Hematocrit 47.2 (H) 62.4 - 51.0 %   MCV 88 79 - 97 fL   MCH 27.7 26.6 - 33.0 pg   MCHC 31.5 31.5 - 35.7 g/dL   RDW 86.2 88.3 - 84.5 %   Platelets 172 150 - 450 x10E3/uL   Neutrophils 52 Not Estab. %   Lymphs 30 Not Estab. %   Monocytes 16 Not Estab. %   Eos 1 Not Estab. %   Basos 1 Not Estab. %   Neutrophils Absolute 5.3 1.4 - 7.0 x10E3/uL   Lymphocytes Absolute 3.0 0.7 - 3.1 x10E3/uL   Monocytes Absolute 1.6 (H) 0.1 - 0.9 x10E3/uL   EOS (ABSOLUTE) 0.1 0.0 - 0.4 x10E3/uL   Basophils Absolute 0.1 0.0 - 0.2 x10E3/uL   Immature Granulocytes 0 Not Estab. %   Immature Grans (Abs) 0.0 0.0 - 0.1 x10E3/uL  CMP14+EGFR   Collection Time: 06/20/24  3:38 PM  Result Value Ref Range   Glucose 121 (H) 70 - 99 mg/dL   BUN 23 8 - 27 mg/dL   Creatinine, Ser 8.57 (H) 0.76 - 1.27 mg/dL   eGFR 53 (L) >40 fO/fpw/8.26   BUN/Creatinine Ratio 16 10 - 24   Sodium 144 134 - 144 mmol/L   Potassium 4.7 3.5 - 5.2 mmol/L   Chloride 104 96 - 106 mmol/L   CO2 23 20 - 29 mmol/L   Calcium  8.7 8.6 - 10.2 mg/dL   Total Protein 7.2 6.0 - 8.5 g/dL   Albumin 4.1 3.9 - 4.9 g/dL   Globulin, Total 3.1 1.5 - 4.5 g/dL   Bilirubin Total 1.0 0.0 - 1.2 mg/dL   Alkaline Phosphatase 176 (H) 44 - 121 IU/L   AST 38 0 - 40 IU/L   ALT 58 (H) 0 - 44 IU/L  Lipid panel   Collection Time: 06/20/24  3:38 PM  Result Value Ref Range   Cholesterol, Total 100 100 - 199 mg/dL   Triglycerides 898 0 - 149 mg/dL   HDL 36 (L) >60 mg/dL   VLDL Cholesterol Cal 19 5 - 40 mg/dL   LDL Chol Calc (NIH) 45 0 - 99 mg/dL   Chol/HDL Ratio 2.8 0.0 - 5.0 ratio     X-Ray: left knee: abnormal appearing calcifications to bilateral lower femur, soft tissue swelling, hardware present and appears well seated. Preliminary x-ray reading by Rosaline Bruns, FNP-C, [facility].   Pertinent labs & imaging results that were available during my  care of the patient were reviewed by me and considered in my medical decision making.  Assessment & Plan:  Kolin Erdahl was seen today for diabetes and knee pain.  Diagnoses and all orders for this visit:  Type 2 diabetes mellitus with other specified complication, without long-term current use of insulin  (HCC) -     Bayer DCA Hb A1c Waived -     CBC with Differential/Platelet -     CMP14+EGFR -     dapagliflozin  propanediol (  FARXIGA ) 10 MG TABS tablet; Take 1 tablet (10 mg total) by mouth daily before breakfast. -     Microalbumin / creatinine urine ratio  Diabetic mononeuropathy associated with type 2 diabetes mellitus (HCC) -     Bayer DCA Hb A1c Waived -     CBC with Differential/Platelet -     CMP14+EGFR -     DULoxetine  (CYMBALTA ) 30 MG capsule; Take 2 capsules (60 mg total) by mouth daily.  Hypertension associated with diabetes (HCC) -     Bayer DCA Hb A1c Waived -     CBC with Differential/Platelet -     CMP14+EGFR -     Microalbumin / creatinine urine ratio  Chronic heart failure with preserved ejection fraction (HCC) -     CBC with Differential/Platelet -     CMP14+EGFR  Hyperlipidemia associated with type 2 diabetes mellitus (HCC) -     Bayer DCA Hb A1c Waived -     CBC with Differential/Platelet -     CMP14+EGFR  Morbid obesity with BMI of 50.0-59.9, adult (HCC) -     Bayer DCA Hb A1c Waived -     CBC with Differential/Platelet -     CMP14+EGFR  Permanent atrial fibrillation (HCC) -     CBC with Differential/Platelet -     CMP14+EGFR  Generalized anxiety disorder -     CBC with Differential/Platelet -     CMP14+EGFR -     DULoxetine  (CYMBALTA ) 30 MG capsule; Take 2 capsules (60 mg total) by mouth daily.  Chronic bilateral low back pain with bilateral sciatica -     CMP14+EGFR -     DULoxetine  (CYMBALTA ) 30 MG capsule; Take 2 capsules (60 mg total) by mouth daily.  Chronic pain of left ankle -     CMP14+EGFR -     DULoxetine  (CYMBALTA ) 30 MG  capsule; Take 2 capsules (60 mg total) by mouth daily.  Left medial knee pain -     Cancel: DG Knee Complete 4 Views Left; Future  Testosterone  deficiency in male -     CBC with Differential/Platelet -     CMP14+EGFR  Urinary urgency -     tamsulosin  (FLOMAX ) 0.4 MG CAPS capsule; Take 1 capsule (0.4 mg total) by mouth daily.      Left medial knee pain Following a popping sensation, initially severe with inability to bend or straighten the knee, improving over time. Possible wear and tear of knee replacements. - Ordered knee x-ray  Type 2 diabetes mellitus with other specified complication Diabetes management complicated by recent prednisone  use for elbow pain, leading to elevated A1c of 7.8%.  Chronic heart failure with preserved ejection fraction Chronic heart failure with preserved ejection fraction, EF 55-60%. Right ventricle enlargement causing decreased pulmonary blood flow. Current medications are appropriate for management. - Continue current heart failure medications  Testosterone  deficiency in male Testosterone  deficiency managed with testosterone  replacement therapy. No reported issues with current regimen.  Urinary urgency Managed with Juvia. Ran out of medication but has a refill scheduled for January. - Ensure refill of Juvia is obtained from pharmacy          Continue all other maintenance medications.  Follow up plan: Return in about 3 months (around 12/18/2024), or if symptoms worsen or fail to improve, for DM.   Continue healthy lifestyle choices, including diet (rich in fruits, vegetables, and lean proteins, and low in salt and simple carbohydrates) and exercise (at least 30 minutes of moderate physical activity  daily).  Educational handout given for DM  The above assessment and management plan was discussed with the patient. The patient verbalized understanding of and has agreed to the management plan. Patient is aware to call the clinic if they develop  any new symptoms or if symptoms persist or worsen. Patient is aware when to return to the clinic for a follow-up visit. Patient educated on when it is appropriate to go to the emergency department.   Rosaline Bruns, FNP-C [facility] [phone]

## 2024-09-19 NOTE — Patient Instructions (Signed)

## 2024-09-20 ENCOUNTER — Ambulatory Visit: Payer: Self-pay | Admitting: Family Medicine

## 2024-09-20 LAB — CMP14+EGFR
ALT: 48 IU/L — ABNORMAL HIGH (ref 0–44)
AST: 33 IU/L (ref 0–40)
Albumin: 4.3 g/dL (ref 3.9–4.9)
Alkaline Phosphatase: 149 IU/L — ABNORMAL HIGH (ref 47–123)
BUN/Creatinine Ratio: 14 (ref 10–24)
BUN: 17 mg/dL (ref 8–27)
Bilirubin Total: 1 mg/dL (ref 0.0–1.2)
CO2: 28 mmol/L (ref 20–29)
Calcium: 9.7 mg/dL (ref 8.6–10.2)
Chloride: 103 mmol/L (ref 96–106)
Creatinine, Ser: 1.2 mg/dL (ref 0.76–1.27)
Globulin, Total: 3.2 g/dL (ref 1.5–4.5)
Glucose: 172 mg/dL — ABNORMAL HIGH (ref 70–99)
Potassium: 4.4 mmol/L (ref 3.5–5.2)
Sodium: 144 mmol/L (ref 134–144)
Total Protein: 7.5 g/dL (ref 6.0–8.5)
eGFR: 65 mL/min/1.73 (ref >59)

## 2024-09-20 LAB — CBC WITH DIFFERENTIAL/PLATELET
Basophils Absolute: 0.1 x10E3/uL (ref 0.0–0.2)
Basos: 1 %
EOS (ABSOLUTE): 0.2 x10E3/uL (ref 0.0–0.4)
Eos: 2 %
Hematocrit: 52.5 % — ABNORMAL HIGH (ref 37.5–51.0)
Hemoglobin: 16.8 g/dL (ref 13.0–17.7)
Immature Grans (Abs): 0.1 x10E3/uL (ref 0.0–0.1)
Immature Granulocytes: 1 %
Lymphocytes Absolute: 2.7 x10E3/uL (ref 0.7–3.1)
Lymphs: 32 %
MCH: 28.6 pg (ref 26.6–33.0)
MCHC: 32 g/dL (ref 31.5–35.7)
MCV: 89 fL (ref 79–97)
Monocytes Absolute: 1.1 x10E3/uL — ABNORMAL HIGH (ref 0.1–0.9)
Monocytes: 12 %
Neutrophils Absolute: 4.4 x10E3/uL (ref 1.4–7.0)
Neutrophils: 52 %
Platelets: 184 x10E3/uL (ref 150–450)
RBC: 5.88 x10E6/uL — ABNORMAL HIGH (ref 4.14–5.80)
RDW: 14.1 % (ref 11.6–15.4)
WBC: 8.5 x10E3/uL (ref 3.4–10.8)

## 2024-09-25 ENCOUNTER — Ambulatory Visit: Payer: Self-pay | Admitting: Family Medicine

## 2024-09-26 LAB — OPHTHALMOLOGY REPORT-SCANNED

## 2024-09-27 ENCOUNTER — Ambulatory Visit

## 2024-09-27 ENCOUNTER — Ambulatory Visit: Admitting: *Deleted

## 2024-09-27 DIAGNOSIS — E291 Testicular hypofunction: Secondary | ICD-10-CM | POA: Diagnosis not present

## 2024-09-27 NOTE — Progress Notes (Signed)
 Patient is in office today for a nurse visit for Testosterone Injection. Patient Injection was given in the  Left quadriceps. Patient tolerated injection well.

## 2024-09-28 ENCOUNTER — Encounter: Payer: Self-pay | Admitting: Family Medicine

## 2024-10-08 ENCOUNTER — Other Ambulatory Visit: Payer: Self-pay | Admitting: Family Medicine

## 2024-10-08 DIAGNOSIS — I152 Hypertension secondary to endocrine disorders: Secondary | ICD-10-CM

## 2024-10-08 DIAGNOSIS — E1169 Type 2 diabetes mellitus with other specified complication: Secondary | ICD-10-CM

## 2024-10-08 DIAGNOSIS — E890 Postprocedural hypothyroidism: Secondary | ICD-10-CM

## 2024-10-12 ENCOUNTER — Ambulatory Visit: Admitting: *Deleted

## 2024-10-12 DIAGNOSIS — E291 Testicular hypofunction: Secondary | ICD-10-CM

## 2024-10-12 NOTE — Progress Notes (Signed)
 Patient is in office today for a nurse visit for Testosterone Injection. Patient Injection was given in the  Right quadriceps. Patient tolerated injection well.

## 2024-10-15 ENCOUNTER — Telehealth: Payer: Self-pay | Admitting: *Deleted

## 2024-10-15 MED ORDER — TRUEPLUS LANCETS 28G MISC: 3 refills | Status: AC

## 2024-10-15 MED ORDER — TRUE METRIX AIR GLUCOSE METER W/DEVICE KIT: PACK | 1 refills | Status: AC

## 2024-10-15 MED ORDER — TRUE METRIX BLOOD GLUCOSE TEST VI STRP: ORAL_STRIP | 3 refills | Status: AC

## 2024-10-15 NOTE — Telephone Encounter (Signed)
 Fax from [facility] One touch supplie not covered require PA, consider Accu-Chek or True Metrix Changed to True Metrix and sent

## 2024-10-17 ENCOUNTER — Ambulatory Visit: Admitting: Urology

## 2024-10-17 ENCOUNTER — Encounter: Payer: Self-pay | Admitting: Urology

## 2024-10-17 VITALS — BP 113/70 | HR 97

## 2024-10-17 DIAGNOSIS — R3915 Urgency of urination: Secondary | ICD-10-CM | POA: Diagnosis not present

## 2024-10-17 DIAGNOSIS — N529 Male erectile dysfunction, unspecified: Secondary | ICD-10-CM

## 2024-10-17 MED ORDER — GEMTESA 75 MG PO TABS: 1.0000 | ORAL_TABLET | Freq: Every day | ORAL | Status: AC

## 2024-10-17 NOTE — Patient Instructions (Signed)

## 2024-10-17 NOTE — Progress Notes (Signed)
 "  10/17/2024 10:22 AM   Richard Crawford 03/06/1956 [id number]  Referring provider: Severa Rock HERO, FNP [address] [place],  [place] [address]  Erectile dysfunction and urinary urgency   HPI: Richard Crawford is a 68yo here for evaluation of erectile dysfunction and urinary urgency. For over 1 year he has noted worsening urinary urgency and occasional urge incontinence. He was previously on on lasix  and now is on farxiga . IPSS 7 QOl 6 For over 5 years he has had difficulty getting and maintaining an erection. He has previously tried sildenafil  and took 300mg  with little effect. He tried daily tadalafil 5mg  and prn with poor results. He has tried VED and ICI without success.     PMH: Past Medical History:  Diagnosis Date   Anxiety    Arthritis    left shoulder   Atrial fibrillation (HCC)    Cancer (HCC) 1989   thyroid    Chronic heart failure with preserved ejection fraction (HCC)    Diabetes mellitus without complication (HCC)    Dysrhythmia    Hemorrhoids    external   Hx of colonic polyps    Hyperlipidemia    Hypertension    Low testosterone  12/14/2020   Migraine    Papillary thyroid  carcinoma (HCC)    Postoperative hypothyroidism    Primary osteoarthritis of left shoulder    Sleep apnea    Venous stasis dermatitis of both lower extremities     Surgical History: Past Surgical History:  Procedure Laterality Date   ANKLE FUSION     COLONOSCOPY WITH PROPOFOL  N/A 11/07/2023   Procedure: COLONOSCOPY WITH PROPOFOL ;  Surgeon: Cindie Carlin POUR, DO;  [facility];  Service: Endoscopy;  Laterality: N/A;  10:15 am, asa 3   KNEE ARTHROSCOPY     POLYPECTOMY  11/07/2023   Procedure: POLYPECTOMY;  Surgeon: Cindie Carlin POUR, DO;  [facility];  Service: Endoscopy;;   REPLACEMENT TOTAL KNEE Right    SHOULDER ARTHROSCOPY     TOTAL KNEE ARTHROPLASTY Left    TOTAL SHOULDER REPLACEMENT Right     Home Medications:  Allergies as of 10/17/2024       Reactions    Morphine Nausea Only   morphine   Morphine And Codeine Nausea And Vomiting, Nausea Only        Medication List        Accurate as of October 17, 2024 10:22 AM. If you have any questions, ask your nurse or doctor.          allopurinol  100 MG tablet Commonly known as: ZYLOPRIM  TAKE 1 TABLET DAILY   atorvastatin  20 MG tablet Commonly known as: LIPITOR TAKE 1 TABLET DAILY   DULoxetine  30 MG capsule Commonly known as: Cymbalta  Take 2 capsules (60 mg total) by mouth daily.   Farxiga  10 MG Tabs tablet Generic drug: dapagliflozin  propanediol TAKE 1 TABLET DAILY BEFORE BREAKFAST   gabapentin  100 MG capsule Commonly known as: NEURONTIN  Take 1 capsule (100 mg total) by mouth 3 (three) times daily.   levothyroxine  200 MCG tablet Commonly known as: SYNTHROID  TAKE 1 TABLET DAILY BEFORE BREAKFAST   levothyroxine  50 MCG tablet Commonly known as: SYNTHROID  TAKE 1 TABLET DAILY BEFORE BREAKFAST (TAKE IN ADDITIONTO 200MCG TABLETFOR TOTAL DOSE )   metoprolol  tartrate 100 MG tablet Commonly known as: LOPRESSOR  TAKE 2 TABLETS DAILY   multivitamin tablet Take 1 tablet by mouth 2 (two) times daily.   Rybelsus  14 MG Tabs Generic drug: Semaglutide  TAKE 1 TABLET DAILY  sildenafil  100 MG tablet Commonly known as: VIAGRA  TAKE 1/2 TO 1 TABLET DAILY AS NEEDED FOR ERECTILE     DYSFUNCTION   SYRINGE-NEEDLE (DISP) 3 ML 20G X 1-1/2 3 ML Misc 1 each by Does not apply route once a week.   tamsulosin  0.4 MG Caps capsule Commonly known as: FLOMAX  Take 1 capsule (0.4 mg total) by mouth daily.   testosterone  cypionate 200 MG/ML injection Commonly known as: DEPOTESTOSTERONE CYPIONATE INJECT 0.75 ML (150 MG) INTRAMUSCULARLY EVERY 14 DAYS   True Metrix Air Glucose Meter w/Device Kit Test BS in the morning, at noon and at bedtime Dx E11.41   True Metrix Blood Glucose Test test strip Generic drug: glucose blood Test BS in the morning, at noon and at bedtime Dx E11.41   TRUEplus  Lancets 28G Misc Test BS in the morning, at noon and at bedtime Dx E11.41   valsartan  80 MG tablet Commonly known as: DIOVAN  Take 1 tablet (80 mg total) by mouth daily.   Xarelto  20 MG Tabs tablet Generic drug: rivaroxaban  TAKE 1 TABLET DAILY WITH   SUPPER        Allergies: Allergies[1]  Family History: Family History  Problem Relation Age of Onset   Hypertension Mother    Dementia Mother    Breast cancer Mother    Stomach cancer Mother    Ovarian cancer Mother    Heart disease Father     Social History:  reports that he has never smoked. He has never used smokeless tobacco. He reports that he does not currently use alcohol. He reports that he does not use drugs.  ROS: All other review of systems were reviewed and are negative except what is noted above in HPI  Physical Exam: BP 113/70   Pulse 97   Constitutional:  Alert and oriented, No acute distress. HEENT: [place] AT, moist mucus membranes.  Trachea midline, no masses. Cardiovascular: No clubbing, cyanosis, or edema. Respiratory: Normal respiratory effort, no increased work of breathing. GI: Abdomen is soft, nontender, nondistended, no abdominal masses GU: No CVA tenderness.  Lymph: No cervical or inguinal lymphadenopathy. Skin: No rashes, bruises or suspicious lesions. Neurologic: Grossly intact, no focal deficits, moving all 4 extremities. Psychiatric: Normal mood and affect.  Laboratory Data: Lab Results  Component Value Date   WBC 8.5 09/19/2024   HGB 16.8 09/19/2024   HCT 52.5 (H) 09/19/2024   MCV 89 09/19/2024   PLT 184 09/19/2024    Lab Results  Component Value Date   CREATININE 1.20 09/19/2024    No results found for: PSA  Lab Results  Component Value Date   TESTOSTERONE  368 03/15/2024    Lab Results  Component Value Date   HGBA1C 7.8 (H) 09/19/2024    Urinalysis    Component Value Date/Time   COLORURINE YELLOW 08/03/2019 2155   APPEARANCEUR CLEAR 08/03/2019 2155   LABSPEC 1.015  08/03/2019 2155   PHURINE 7.0 08/03/2019 2155   GLUCOSEU NEGATIVE 08/03/2019 2155   HGBUR MODERATE (A) 08/03/2019 2155   BILIRUBINUR NEGATIVE 08/03/2019 2155   KETONESUR NEGATIVE 08/03/2019 2155   PROTEINUR NEGATIVE 08/03/2019 2155   NITRITE NEGATIVE 08/03/2019 2155   LEUKOCYTESUR NEGATIVE 08/03/2019 2155    Lab Results  Component Value Date   LABMICR 5.9 05/05/2020   BACTERIA NONE SEEN 08/03/2019    Pertinent Imaging:  No results found for this or any previous visit.  No results found for this or any previous visit.  No results found for this or any previous visit.  No results found for this or any previous visit.  No results found for this or any previous visit.  No results found for this or any previous visit.  No results found for this or any previous visit.  No results found for this or any previous visit.   Assessment & Plan:    1. Erectile dysfunction, unspecified erectile dysfunction type (Primary) Patient defers therapy  2. Urinary urgency -We will trial gemtesa  75mg    No follow-ups on file.  Belvie Clara, MD  [facility]       [1]  Allergies Allergen Reactions   Morphine Nausea Only    morphine   Morphine And Codeine Nausea And Vomiting and Nausea Only   "

## 2024-10-25 ENCOUNTER — Ambulatory Visit (INDEPENDENT_AMBULATORY_CARE_PROVIDER_SITE_OTHER): Admitting: *Deleted

## 2024-10-25 DIAGNOSIS — E291 Testicular hypofunction: Secondary | ICD-10-CM | POA: Diagnosis not present

## 2024-10-25 NOTE — Progress Notes (Signed)
 Patient is in office today for a nurse visit for Testosterone Injection. Patient Injection was given in the  Left quadriceps. Patient tolerated injection well.

## 2024-10-29 ENCOUNTER — Other Ambulatory Visit: Payer: Self-pay | Admitting: *Deleted

## 2024-10-29 DIAGNOSIS — E1169 Type 2 diabetes mellitus with other specified complication: Secondary | ICD-10-CM

## 2024-10-30 MED ORDER — SILDENAFIL CITRATE 100 MG PO TABS: 100.0000 mg | ORAL_TABLET | ORAL | 11 refills | Status: AC | PRN

## 2024-10-31 ENCOUNTER — Encounter: Payer: Self-pay | Admitting: Family Medicine

## 2024-10-31 ENCOUNTER — Ambulatory Visit: Admitting: Family Medicine

## 2024-10-31 VITALS — BP 123/66 | HR 91 | Temp 97.6°F | Ht 75.0 in | Wt 359.8 lb

## 2024-10-31 DIAGNOSIS — J189 Pneumonia, unspecified organism: Secondary | ICD-10-CM

## 2024-10-31 MED ORDER — PREDNISONE 20 MG PO TABS: 20.0000 mg | ORAL_TABLET | Freq: Every day | ORAL | 0 refills | Status: AC

## 2024-10-31 MED ORDER — AZITHROMYCIN 250 MG PO TABS: ORAL_TABLET | ORAL | 0 refills | Status: AC

## 2024-10-31 MED ORDER — AMOXICILLIN-POT CLAVULANATE 875-125 MG PO TABS: 1.0000 | ORAL_TABLET | Freq: Two times a day (BID) | ORAL | 0 refills | Status: AC

## 2024-10-31 NOTE — Progress Notes (Signed)
 "    Subjective:  Patient ID: Richard Crawford, male    DOB: 08/29/1956, 69 y.o.   MRN: [id number]  Patient Care Team: Severa Rock HERO, FNP as PCP - General (Family Medicine) Lavona Agent, MD as PCP - Cardiology (Cardiology)   Chief Complaint:  Nasal Congestion and Cough (X 5 days- otc [facility] sinus medication )   HPI: Richard Crawford is a 69 y.o. male presenting on 10/31/2024 for Nasal Congestion and Cough (X 5 days- otc [facility] sinus medication )    Richard Crawford is a 69 year old male who presents with cough and chest congestion.  His symptoms began after visiting the clinic last Thursday, suspecting he caught something in the waiting room. Since then, he has experienced significant chest and nasal congestion. He describes a persistent cough producing brownish sputum. No fever or body aches, but he mentions unusual joint pain.  He has been using over-the-counter medications, which include ingredients that could potentially elevate blood pressure. He also mentions using a coat hanger to remove tonsil stones, which he attributes to causing a sore throat. He has a history of thyroid  disease, which he notes is associated with the development of tonsil stones. He has not experienced these before and finds them unpleasant.  He has been drinking hot chocolate to soothe his throat and clear his nasal passages, as he does not typically drink water . He recently switched his pharmacy to [facility] due to insurance issues.          Relevant past medical, surgical, family, and social history reviewed and updated as indicated.  Allergies and medications reviewed and updated. Data reviewed: Chart in Epic.   Past Medical History:  Diagnosis Date   Anxiety    Arthritis    left shoulder   Atrial fibrillation (HCC)    Cancer (HCC) 1989   thyroid    Chronic heart failure with preserved ejection fraction (HCC)    Diabetes mellitus without complication (HCC)    Dysrhythmia    Hemorrhoids     external   Hx of colonic polyps    Hyperlipidemia    Hypertension    Low testosterone  12/14/2020   Migraine    Papillary thyroid  carcinoma (HCC)    Postoperative hypothyroidism    Primary osteoarthritis of left shoulder    Sleep apnea    Venous stasis dermatitis of both lower extremities     Past Surgical History:  Procedure Laterality Date   ANKLE FUSION     COLONOSCOPY WITH PROPOFOL  N/A 11/07/2023   Procedure: COLONOSCOPY WITH PROPOFOL ;  Surgeon: Cindie Carlin POUR, DO;  Location: [facility];  Service: Endoscopy;  Laterality: N/A;  10:15 am, asa 3   KNEE ARTHROSCOPY     POLYPECTOMY  11/07/2023   Procedure: POLYPECTOMY;  Surgeon: Cindie Carlin POUR, DO;  Location: [facility];  Service: Endoscopy;;   REPLACEMENT TOTAL KNEE Right    SHOULDER ARTHROSCOPY     TOTAL KNEE ARTHROPLASTY Left    TOTAL SHOULDER REPLACEMENT Right     Social History   Socioeconomic History   Marital status: Married    Spouse name: Not on file   Number of children: Not on file   Years of education: Not on file   Highest education level: 12th grade  Occupational History   Occupation: retired  Tobacco Use   Smoking status: Never   Smokeless tobacco: Never  Substance and Sexual Activity   Alcohol use: Not Currently   Drug use: Never   Sexual activity:  Not on file  Other Topics Concern   Not on file  Social History Narrative   Lives at home with wife - Originally from [place] ; moved here so wife could be closer to her grandchildren (second wife)   Social Drivers of Health   Tobacco Use: Low Risk (10/31/2024)   Patient History    Smoking Tobacco Use: Never    Smokeless Tobacco Use: Never    Passive Exposure: Not on file  Financial Resource Strain: Medium Risk (08/15/2024)   Overall Financial Resource Strain (CARDIA)    Difficulty of Paying Living Expenses: Somewhat hard  Food Insecurity: No Food Insecurity (08/15/2024)   Epic    Worried About Radiation Protection Practitioner of Food in the Last Year: Never  true    Ran Out of Food in the Last Year: Never true  Transportation Needs: No Transportation Needs (08/15/2024)   Epic    Lack of Transportation (Medical): No    Lack of Transportation (Non-Medical): No  Physical Activity: Inactive (08/15/2024)   Exercise Vital Sign    Days of Exercise per Week: 0 days    Minutes of Exercise per Session: Not on file  Stress: Stress Concern Present (08/15/2024)   Richard Crawford    Feeling of Stress: To some extent  Social Connections: Socially Isolated (08/15/2024)   Social Connection and Isolation Panel    Frequency of Communication with Friends and Family: Once a week    Frequency of Social Gatherings with Friends and Family: Once a week    Attends Religious Services: Never    Database Administrator or Organizations: No    Attends Engineer, Structural: Not on file    Marital Status: Married  Catering Manager Violence: Not on file  Depression (PHQ2-9): Low Risk (09/19/2024)   Depression (PHQ2-9)    PHQ-2 Score: 0  Alcohol Screen: Not on file  Housing: Low Risk (08/15/2024)   Epic    Unable to Pay for Housing in the Last Year: No    Number of Times Moved in the Last Year: 0    Homeless in the Last Year: No  Utilities: Not on file  Health Literacy: Not on file    Outpatient Encounter Medications as of 10/31/2024  Medication Sig   allopurinol  (ZYLOPRIM ) 100 MG tablet TAKE 1 TABLET DAILY   amoxicillin -clavulanate (AUGMENTIN ) 875-125 MG tablet Take 1 tablet by mouth 2 (two) times daily.   atorvastatin  (LIPITOR) 20 MG tablet TAKE 1 TABLET DAILY   azithromycin  (ZITHROMAX ) 250 MG tablet Take 2 tablets on day 1, then 1 tablet daily on days 2 through 5   Blood Glucose Monitoring Suppl (TRUE METRIX AIR GLUCOSE METER) w/Device KIT Test BS in the morning, at noon and at bedtime Dx E11.41   DULoxetine  (CYMBALTA ) 30 MG capsule Take 2 capsules (60 mg total) by mouth daily.   FARXIGA  10 MG  TABS tablet TAKE 1 TABLET DAILY BEFORE BREAKFAST   gabapentin  (NEURONTIN ) 100 MG capsule Take 1 capsule (100 mg total) by mouth 3 (three) times daily.   glucose blood (TRUE METRIX BLOOD GLUCOSE TEST) test strip Test BS in the morning, at noon and at bedtime Dx E11.41   levothyroxine  (SYNTHROID ) 200 MCG tablet TAKE 1 TABLET DAILY BEFORE BREAKFAST   levothyroxine  (SYNTHROID ) 50 MCG tablet TAKE 1 TABLET DAILY BEFORE BREAKFAST (TAKE IN ADDITIONTO 200MCG TABLETFOR TOTAL DOSE )   metoprolol  tartrate (LOPRESSOR ) 100 MG tablet TAKE 2 TABLETS DAILY   Multiple Vitamin (MULTIVITAMIN)  tablet Take 1 tablet by mouth 2 (two) times daily.   predniSONE  (DELTASONE ) 20 MG tablet Take 1 tablet (20 mg total) by mouth daily with breakfast for 5 days.   RYBELSUS  14 MG TABS TAKE 1 TABLET DAILY   sildenafil  (VIAGRA ) 100 MG tablet Take 1 tablet (100 mg total) by mouth as needed for erectile dysfunction.   SYRINGE-NEEDLE, DISP, 3 ML 20G X 1-1/2 3 ML MISC 1 each by Does not apply route once a week.   tamsulosin  (FLOMAX ) 0.4 MG CAPS capsule Take 1 capsule (0.4 mg total) by mouth daily.   testosterone  cypionate (DEPOTESTOSTERONE CYPIONATE) 200 MG/ML injection INJECT 0.75 ML (150 MG) INTRAMUSCULARLY EVERY 14 DAYS   TRUEplus Lancets 28G MISC Test BS in the morning, at noon and at bedtime Dx E11.41   valsartan  (DIOVAN ) 80 MG tablet Take 1 tablet (80 mg total) by mouth daily.   Vibegron  (GEMTESA ) 75 MG TABS Take 1 tablet (75 mg total) by mouth daily.   XARELTO  20 MG TABS tablet TAKE 1 TABLET DAILY WITH   SUPPER   Facility-Administered Encounter Medications as of 10/31/2024  Medication   testosterone  cypionate (DEPOTESTOSTERONE CYPIONATE) injection 175 mg    Allergies[1]  Pertinent ROS per HPI, otherwise unremarkable      Objective:  BP 123/66   Pulse 91   Temp 97.6 F (36.4 C)   Ht 6' 3 (1.905 m)   Wt (!) 359 lb 12.8 oz (163.2 kg)   SpO2 95%   BMI 44.97 kg/m    Wt Readings from Last 3 Encounters:   10/31/24 (!) 359 lb 12.8 oz (163.2 kg)  09/19/24 (!) 366 lb 9.6 oz (166.3 kg)  08/17/24 (!) 359 lb (162.8 kg)    Physical Exam Vitals and nursing note reviewed.  Constitutional:      General: He is not in acute distress.    Appearance: He is morbidly obese. He is ill-appearing. He is not toxic-appearing or diaphoretic.  HENT:     Head: Normocephalic and atraumatic.     Right Ear: Tympanic membrane, ear canal and external ear normal.     Left Ear: Tympanic membrane, ear canal and external ear normal.     Nose: Nose normal.     Mouth/Throat:     Mouth: Mucous membranes are moist.  Eyes:     Pupils: Pupils are equal, round, and reactive to light.  Cardiovascular:     Rate and Rhythm: Normal rate. Rhythm irregularly irregular.  Pulmonary:     Effort: Pulmonary effort is normal.     Breath sounds: Examination of the left-lower field reveals decreased breath sounds, wheezing and rhonchi. Decreased breath sounds, wheezing and rhonchi present. No rales.     Comments: Congested cough Musculoskeletal:     Cervical back: Neck supple.     Right lower leg: 1+ Edema present.     Left lower leg: 1+ Edema present.  Lymphadenopathy:     Cervical: No cervical adenopathy.  Skin:    General: Skin is warm and dry.     Capillary Refill: Capillary refill takes less than 2 seconds.  Neurological:     General: No focal deficit present.     Mental Status: He is alert and oriented to person, place, and time.  Psychiatric:        Mood and Affect: Mood normal.        Behavior: Behavior normal. Behavior is cooperative.        Thought Content: Thought content normal.  Judgment: Judgment normal.     Results for orders placed or performed in visit on 09/28/24  OPHTHALMOLOGY REPORT-SCANNED   Collection Time: 09/26/24  1:56 PM  Result Value Ref Range   HM Diabetic Eye Exam No Retinopathy No Retinopathy   A Comment         Pertinent labs & imaging results that were available during my care  of the patient were reviewed by me and considered in my medical decision making.  Assessment & Plan:  Richard Crawford was seen today for nasal congestion and cough.  Diagnoses and all orders for this visit:  Community acquired pneumonia of left lower lobe of lung -     azithromycin  (ZITHROMAX ) 250 MG tablet; Take 2 tablets on day 1, then 1 tablet daily on days 2 through 5 -     amoxicillin -clavulanate (AUGMENTIN ) 875-125 MG tablet; Take 1 tablet by mouth 2 (two) times daily. -     predniSONE  (DELTASONE ) 20 MG tablet; Take 1 tablet (20 mg total) by mouth daily with breakfast for 5 days.       Community acquired pneumonia, left lower lobe Community acquired pneumonia in the left lower lobe with symptoms of cough, congestion, and wheezing. No fever or body aches reported. Wheezing and bronchi in the left lower lobe indicate inflammation and swelling. Risk of worsening due to diabetes necessitates prompt treatment. - Prescribed azithromycin  and Augmentin  for pneumonia. - Prescribed prednisone  for wheezing to reduce airway inflammation. - Advised continued use of Mucinex with added water  to aid in clearing congestion. - Instructed to monitor blood pressure due to potential elevation from phenylpyrine in Mucinex. - Advised to report any worsening of symptoms.          Continue all other maintenance medications.  Follow up plan: Return if symptoms worsen or fail to improve.   Continue healthy lifestyle choices, including diet (rich in fruits, vegetables, and lean proteins, and low in salt and simple carbohydrates) and exercise (at least 30 minutes of moderate physical activity daily).  Educational handout given for CAP  The above assessment and management plan was discussed with the patient. The patient verbalized understanding of and has agreed to the management plan. Patient is aware to call the clinic if they develop any new symptoms or if symptoms persist or worsen. Patient is aware  when to return to the clinic for a follow-up visit. Patient educated on when it is appropriate to go to the emergency department.   Richard Bruns, FNP-C [facility] [phone]     [1]  Allergies Allergen Reactions   Morphine Nausea Only    morphine   Morphine And Codeine Nausea And Vomiting and Nausea Only   "

## 2024-11-05 ENCOUNTER — Encounter: Payer: Self-pay | Admitting: Family Medicine

## 2024-11-08 ENCOUNTER — Ambulatory Visit

## 2024-11-08 DIAGNOSIS — E291 Testicular hypofunction: Secondary | ICD-10-CM

## 2024-11-12 ENCOUNTER — Other Ambulatory Visit (HOSPITAL_COMMUNITY): Payer: Self-pay

## 2024-11-13 ENCOUNTER — Encounter: Payer: Self-pay | Admitting: Family Medicine

## 2024-12-25 ENCOUNTER — Ambulatory Visit: Admitting: Family Medicine

## 2025-02-15 ENCOUNTER — Ambulatory Visit: Admitting: Urology
